# Patient Record
Sex: Female | Born: 1950
Health system: Southern US, Community
[De-identification: ages and names within clinical notes are randomized; demographics above are authoritative.]

## PROBLEM LIST (undated history)

## (undated) DIAGNOSIS — F32A Depression, unspecified: Secondary | ICD-10-CM

## (undated) DIAGNOSIS — C50919 Malignant neoplasm of unspecified site of unspecified female breast: Secondary | ICD-10-CM

## (undated) DIAGNOSIS — M1990 Unspecified osteoarthritis, unspecified site: Secondary | ICD-10-CM

## (undated) DIAGNOSIS — Z923 Personal history of irradiation: Secondary | ICD-10-CM

## (undated) DIAGNOSIS — K6389 Other specified diseases of intestine: Secondary | ICD-10-CM

## (undated) DIAGNOSIS — Z8719 Personal history of other diseases of the digestive system: Secondary | ICD-10-CM

## (undated) DIAGNOSIS — K5900 Constipation, unspecified: Secondary | ICD-10-CM

## (undated) DIAGNOSIS — Z973 Presence of spectacles and contact lenses: Secondary | ICD-10-CM

## (undated) DIAGNOSIS — Z860100 Personal history of colon polyps, unspecified: Secondary | ICD-10-CM

## (undated) DIAGNOSIS — R519 Headache, unspecified: Secondary | ICD-10-CM

## (undated) DIAGNOSIS — Z87440 Personal history of urinary (tract) infections: Secondary | ICD-10-CM

## (undated) DIAGNOSIS — R0981 Nasal congestion: Secondary | ICD-10-CM

## (undated) DIAGNOSIS — Z9221 Personal history of antineoplastic chemotherapy: Secondary | ICD-10-CM

## (undated) DIAGNOSIS — Z8709 Personal history of other diseases of the respiratory system: Secondary | ICD-10-CM

## (undated) DIAGNOSIS — I82890 Acute embolism and thrombosis of other specified veins: Secondary | ICD-10-CM

## (undated) DIAGNOSIS — E039 Hypothyroidism, unspecified: Secondary | ICD-10-CM

## (undated) DIAGNOSIS — I8290 Acute embolism and thrombosis of unspecified vein: Secondary | ICD-10-CM

## (undated) DIAGNOSIS — R42 Dizziness and giddiness: Secondary | ICD-10-CM

## (undated) DIAGNOSIS — I10 Essential (primary) hypertension: Secondary | ICD-10-CM

## (undated) DIAGNOSIS — R112 Nausea with vomiting, unspecified: Secondary | ICD-10-CM

## (undated) DIAGNOSIS — J189 Pneumonia, unspecified organism: Secondary | ICD-10-CM

## (undated) DIAGNOSIS — E079 Disorder of thyroid, unspecified: Secondary | ICD-10-CM

## (undated) DIAGNOSIS — E785 Hyperlipidemia, unspecified: Secondary | ICD-10-CM

## (undated) DIAGNOSIS — M2550 Pain in unspecified joint: Secondary | ICD-10-CM

## (undated) DIAGNOSIS — M7989 Other specified soft tissue disorders: Secondary | ICD-10-CM

## (undated) DIAGNOSIS — K649 Unspecified hemorrhoids: Secondary | ICD-10-CM

## (undated) DIAGNOSIS — D649 Anemia, unspecified: Secondary | ICD-10-CM

## (undated) DIAGNOSIS — R110 Nausea: Secondary | ICD-10-CM

## (undated) DIAGNOSIS — Z8669 Personal history of other diseases of the nervous system and sense organs: Secondary | ICD-10-CM

## (undated) DIAGNOSIS — R51 Headache: Secondary | ICD-10-CM

## (undated) DIAGNOSIS — K219 Gastro-esophageal reflux disease without esophagitis: Secondary | ICD-10-CM

## (undated) DIAGNOSIS — A159 Respiratory tuberculosis unspecified: Secondary | ICD-10-CM

## (undated) DIAGNOSIS — Z889 Allergy status to unspecified drugs, medicaments and biological substances status: Secondary | ICD-10-CM

## (undated) DIAGNOSIS — F329 Major depressive disorder, single episode, unspecified: Secondary | ICD-10-CM

## (undated) DIAGNOSIS — Z9889 Other specified postprocedural states: Secondary | ICD-10-CM

## (undated) DIAGNOSIS — C801 Malignant (primary) neoplasm, unspecified: Secondary | ICD-10-CM

## (undated) DIAGNOSIS — M549 Dorsalgia, unspecified: Secondary | ICD-10-CM

## (undated) DIAGNOSIS — Z8489 Family history of other specified conditions: Secondary | ICD-10-CM

## (undated) HISTORY — DX: Nasal congestion: R09.81

## (undated) HISTORY — DX: Nausea: R11.0

## (undated) HISTORY — DX: Other specified soft tissue disorders: M79.89

## (undated) HISTORY — PX: CARPAL TUNNEL RELEASE: SHX101

## (undated) HISTORY — PX: PORTACATH PLACEMENT: SHX2246

## (undated) HISTORY — DX: Headache: R51

## (undated) HISTORY — DX: Headache, unspecified: R51.9

## (undated) HISTORY — PX: ESOPHAGOGASTRODUODENOSCOPY: SHX1529

## (undated) HISTORY — DX: Hyperlipidemia, unspecified: E78.5

## (undated) HISTORY — DX: Constipation, unspecified: K59.00

## (undated) HISTORY — DX: Essential (primary) hypertension: I10

## (undated) HISTORY — DX: Disorder of thyroid, unspecified: E07.9

## (undated) HISTORY — PX: EXPLORATORY LAPAROTOMY: SUR591

## (undated) HISTORY — PX: ABDOMINAL HYSTERECTOMY: SHX81

## (undated) HISTORY — PX: APPENDECTOMY: SHX54

## (undated) HISTORY — PX: COLONOSCOPY: SHX174

---

## 1996-08-16 HISTORY — PX: KNEE SURGERY: SHX244

## 1997-11-20 ENCOUNTER — Encounter: Admission: RE | Admit: 1997-11-20 | Discharge: 1997-11-20 | Payer: Self-pay | Admitting: Sports Medicine

## 1997-12-30 ENCOUNTER — Encounter: Admission: RE | Admit: 1997-12-30 | Discharge: 1997-12-30 | Payer: Self-pay | Admitting: Family Medicine

## 1998-02-12 ENCOUNTER — Encounter: Admission: RE | Admit: 1998-02-12 | Discharge: 1998-02-12 | Payer: Self-pay | Admitting: Family Medicine

## 1998-02-27 ENCOUNTER — Other Ambulatory Visit: Admission: RE | Admit: 1998-02-27 | Discharge: 1998-02-27 | Payer: Self-pay | Admitting: Family Medicine

## 1998-02-27 ENCOUNTER — Encounter: Admission: RE | Admit: 1998-02-27 | Discharge: 1998-02-27 | Payer: Self-pay | Admitting: Family Medicine

## 1998-03-18 ENCOUNTER — Encounter: Admission: RE | Admit: 1998-03-18 | Discharge: 1998-03-18 | Payer: Self-pay | Admitting: Family Medicine

## 1998-11-12 ENCOUNTER — Encounter: Admission: RE | Admit: 1998-11-12 | Discharge: 1998-11-12 | Payer: Self-pay | Admitting: Family Medicine

## 1998-11-26 ENCOUNTER — Encounter: Admission: RE | Admit: 1998-11-26 | Discharge: 1998-11-26 | Payer: Self-pay | Admitting: Family Medicine

## 1999-03-06 ENCOUNTER — Encounter: Admission: RE | Admit: 1999-03-06 | Discharge: 1999-03-06 | Payer: Self-pay | Admitting: Family Medicine

## 1999-03-17 ENCOUNTER — Encounter: Admission: RE | Admit: 1999-03-17 | Discharge: 1999-03-17 | Payer: Self-pay | Admitting: Family Medicine

## 1999-03-26 ENCOUNTER — Encounter: Admission: RE | Admit: 1999-03-26 | Discharge: 1999-03-26 | Payer: Self-pay | Admitting: Family Medicine

## 1999-06-24 ENCOUNTER — Encounter: Admission: RE | Admit: 1999-06-24 | Discharge: 1999-06-24 | Payer: Self-pay | Admitting: Family Medicine

## 1999-07-30 ENCOUNTER — Encounter: Admission: RE | Admit: 1999-07-30 | Discharge: 1999-07-30 | Payer: Self-pay | Admitting: Family Medicine

## 1999-12-15 ENCOUNTER — Encounter: Admission: RE | Admit: 1999-12-15 | Discharge: 1999-12-15 | Payer: Self-pay | Admitting: Family Medicine

## 1999-12-29 ENCOUNTER — Encounter: Admission: RE | Admit: 1999-12-29 | Discharge: 1999-12-29 | Payer: Self-pay | Admitting: Family Medicine

## 2000-03-14 ENCOUNTER — Encounter: Admission: RE | Admit: 2000-03-14 | Discharge: 2000-03-14 | Payer: Self-pay | Admitting: Family Medicine

## 2000-04-01 ENCOUNTER — Encounter: Admission: RE | Admit: 2000-04-01 | Discharge: 2000-04-01 | Payer: Self-pay | Admitting: Family Medicine

## 2000-04-21 ENCOUNTER — Ambulatory Visit (HOSPITAL_COMMUNITY): Admission: RE | Admit: 2000-04-21 | Discharge: 2000-04-21 | Payer: Self-pay | Admitting: Legal Medicine

## 2000-04-29 ENCOUNTER — Encounter: Admission: RE | Admit: 2000-04-29 | Discharge: 2000-04-29 | Payer: Self-pay | Admitting: Family Medicine

## 2000-05-02 ENCOUNTER — Encounter: Admission: RE | Admit: 2000-05-02 | Discharge: 2000-05-02 | Payer: Self-pay | Admitting: Family Medicine

## 2000-10-05 ENCOUNTER — Encounter: Admission: RE | Admit: 2000-10-05 | Discharge: 2000-10-05 | Payer: Self-pay | Admitting: Family Medicine

## 2001-12-06 ENCOUNTER — Encounter: Admission: RE | Admit: 2001-12-06 | Discharge: 2001-12-06 | Payer: Self-pay | Admitting: Family Medicine

## 2002-06-19 ENCOUNTER — Encounter: Admission: RE | Admit: 2002-06-19 | Discharge: 2002-06-19 | Payer: Self-pay | Admitting: Family Medicine

## 2002-07-11 ENCOUNTER — Encounter: Admission: RE | Admit: 2002-07-11 | Discharge: 2002-07-11 | Payer: Self-pay | Admitting: Family Medicine

## 2002-07-25 ENCOUNTER — Encounter: Payer: Self-pay | Admitting: Sports Medicine

## 2002-07-25 ENCOUNTER — Ambulatory Visit (HOSPITAL_COMMUNITY): Admission: RE | Admit: 2002-07-25 | Discharge: 2002-07-25 | Payer: Self-pay | Admitting: Sports Medicine

## 2002-08-01 ENCOUNTER — Encounter: Admission: RE | Admit: 2002-08-01 | Discharge: 2002-08-01 | Payer: Self-pay | Admitting: Family Medicine

## 2002-08-07 ENCOUNTER — Encounter: Payer: Self-pay | Admitting: Sports Medicine

## 2002-08-07 ENCOUNTER — Encounter: Admission: RE | Admit: 2002-08-07 | Discharge: 2002-08-07 | Payer: Self-pay | Admitting: Sports Medicine

## 2002-08-24 ENCOUNTER — Ambulatory Visit (HOSPITAL_COMMUNITY): Admission: RE | Admit: 2002-08-24 | Discharge: 2002-08-24 | Payer: Self-pay | Admitting: Sports Medicine

## 2002-08-24 ENCOUNTER — Encounter: Payer: Self-pay | Admitting: Sports Medicine

## 2002-08-28 ENCOUNTER — Encounter: Admission: RE | Admit: 2002-08-28 | Discharge: 2002-08-28 | Payer: Self-pay | Admitting: Sports Medicine

## 2002-09-05 ENCOUNTER — Encounter: Admission: RE | Admit: 2002-09-05 | Discharge: 2002-09-05 | Payer: Self-pay | Admitting: Family Medicine

## 2002-09-17 ENCOUNTER — Encounter: Admission: RE | Admit: 2002-09-17 | Discharge: 2002-09-17 | Payer: Self-pay | Admitting: Family Medicine

## 2002-10-08 ENCOUNTER — Encounter: Admission: RE | Admit: 2002-10-08 | Discharge: 2002-10-08 | Payer: Self-pay | Admitting: Family Medicine

## 2002-10-11 ENCOUNTER — Encounter: Admission: RE | Admit: 2002-10-11 | Discharge: 2002-10-11 | Payer: Self-pay | Admitting: Family Medicine

## 2005-09-07 ENCOUNTER — Inpatient Hospital Stay (HOSPITAL_COMMUNITY): Admission: EM | Admit: 2005-09-07 | Discharge: 2005-09-08 | Payer: Self-pay | Admitting: Emergency Medicine

## 2006-02-22 ENCOUNTER — Ambulatory Visit (HOSPITAL_COMMUNITY): Admission: RE | Admit: 2006-02-22 | Discharge: 2006-02-22 | Payer: Self-pay | Admitting: Emergency Medicine

## 2007-05-10 ENCOUNTER — Ambulatory Visit (HOSPITAL_COMMUNITY): Admission: RE | Admit: 2007-05-10 | Discharge: 2007-05-10 | Payer: Self-pay | Admitting: Family Medicine

## 2010-09-06 ENCOUNTER — Encounter: Payer: Self-pay | Admitting: Emergency Medicine

## 2011-01-01 NOTE — Cardiovascular Report (Signed)
NAMECHALON, ZOBRIST               ACCOUNT NO.:  [id number]   MEDICAL RECORD NO.:  [id number]          PATIENT TYPE:  INP   LOCATION:  1830                         FACILITY:  [facility]   PHYSICIAN:  Nanetta Batty, M.D.   DATE OF BIRTH:  Aug 20, 1950   DATE OF PROCEDURE:  09/07/2005  DATE OF DISCHARGE:                              CARDIAC CATHETERIZATION   CLINICAL HISTORY:  [name] is a 60 year old mildly overweight African-  American female patient of Dr. Verl Dicker with a history of hypertension and  dyspnea on exertion.  She had a normal echocardiogram and carotid stress  test.  She developed chest pain at 11:00 this morning which was sharp and  substernal.  EMS arrived and she was treated with aspirin and sublingual  nitroglycerin which resulted in improvement in her chest pain symptoms.  She  was brought to [facility] ER where she had waxing and waning chest pain.  Her examination was benign.  EKG showed no acute changes.  Her enzymes were  negative.  She was heparinized and was brought to the catheterization  laboratory for diagnostic coronary arteriography to rule out an ischemic  etiology.   PROCEDURE DESCRIPTION:  The patient was brought to the second floor [facility] in the postabsorptive state.  She was  premedicated with p.o. Valium and IV Versed.  Her right groin was prepped  and shaved in the usual sterile fashion.  1% Xylocaine was used for local  anesthesia.  A 6-French sheath was inserted into the right femoral artery  using standard Seldinger technique.  6-French right and left Judkins  diagnostic catheters along with 6-French pigtail catheter were used for  selective coronary angiography, left ventriculography, supravalvular  aortography, respectively.  Visipaque dye was used for the entirety of the  case.  Retrograde aortic, right and left ventricular, and pullback pressures  were recorded.   HEMODYNAMICS:  1.  Aortic systolic  pressure 114, diastolic pressure 82.  2.  Left ventricular systolic pressure 117, end-diastolic pressure 10.   SELECTIVE CORONARY ANGIOGRAPHY:  Left main normal.   LAD normal.   Left circumflex normal.   Ramus intermedius branch normal.   Right coronary artery was codominant and normal.   LEFT VENTRICULOGRAM:  RAO left ventriculogram was performed using 25 mL of  Visipaque dye at 12 mL/second.  The overall LV EF was estimated at greater  than 60% without focal wall motion abnormalities.   SUPRAVALVULAR AORTOGRAPHY:  Performed in the LAO view using 20 mL of  Visipaque dye at 20 mL/second.  The aortic root was normal in caliber.  There was no AI.  There was no dissection.  Aortic vessels were intact.   IMPRESSION:  [name] has essentially normal catheterization, normal  coronary arteries and left ventricular function without evidence of  dissection.  Believe her chest pain is non-cardiac.  She will be treated  with PPI b.i.d.  and we will obtain a chest CT in the morning to rule out pulmonary embolus  which I think is low likelihood.  If this is negative she will be discharged  home  with follow-up with Dr. Yates Decamp.  Patient left the laboratory in  stable condition.      Nanetta Batty, M.D.  Electronically Signed     JB/MEDQ  D:  09/07/2005  T:  09/08/2005  Job:  [id number]   cc:   Cath Lab   [facility]   Lovenia Kim, D.O.  Fax: [phone]   Cristy Hilts. Jacinto Halim, MD  Fax: [phone]

## 2011-01-01 NOTE — Discharge Summary (Signed)
[name], [name]               ACCOUNT NO.:  [id number]   MEDICAL RECORD NO.:  [id number]          PATIENT TYPE:  INP   LOCATION:  4707                         FACILITY:  [facility]   PHYSICIAN:  Nanetta Batty, M.D.   DATE OF BIRTH:  1951-08-12   DATE OF ADMISSION:  09/07/2005  DATE OF DISCHARGE:  09/08/2005                                 DISCHARGE SUMMARY   DISCHARGE DIAGNOSES:  1.  Chest pain, negative for myocardial infarction, patent coronary arteries      with normal left ventricular function, negative computerized tomography      of the chest for pulmonary embolus.  2.  Family history of coronary disease.  3.  Hypertension.  4.  Hypothyroidism.  5.  Right lung nodules,   DISCHARGE CONDITION:  The discharge condition is improved.   OPERATION/PROCEDURE:  September 07, 2005 __________  left heart  catheterization by Dr. Nanetta Batty.   DISCHARGE MEDICATIONS:  1.  Protonix 40 mg 1 twice a day.  2.  Maxzide 37.5/25 mg daily.  3.  Synthroid 100 mcg daily.  4.  Toprol XL 25 mg daily.   DISCHARGE INSTRUCTIONS:   DIET:  Low-fat, low-salt diet.   WOUND CARE:  Wash the right groin site with soap and water.  Call us for any  bleeding, swelling or drainage.   FOLLOW UP:  The patient is follow up with Dr. Allyson Sabal; the office will call  the patient with the appointment date and time.  The patient will need a  repeat CT of the chest in three months for follow up secondary to lung  nodules with the largest measuring 3 mm.   HISTORY OF THE PRESENT ILLNESS:  This is a 60 year old African-American  female without prior history of coronary disease presents to the ER with  complaints of chest pain, weakness and nausea, which started the morning of  admission September 07, 2005, and she was unable to finish cooking her  breakfast.  She felt severe chest pressure, pain in the middle of the chest  and she felt nauseated.  She came to the emergency room and is admitted to  [facility].   PAST MEDICAL HISTORY:  1.  Graves' disease.  2.  Hypertension,  3.  Mild depression.  4.  Interstitial cystitis.  5.  History of hysterectomy.   Last Cardiolite was in 2006.  EF was 70%, though she did have breast  attenuation artifact.  Two-dimensional echo in September 2006 revealed  impaired LV relaxation, normal EF and no significant valvular abnormality.   FAMILY HISTORY:  The family history is strongly positive for coronary  disease.   SOCIAL HISTORY:  Please see the history and physical.   REVIEW OF SYSTEMS:  Please see the history and physical.   PHYSICAL EXAMINATION:  The physical exam at discharged shows the following:  VITAL SIGNS:  Blood pressure 104/46, pulse 64, respirations 18, temperature  98.2, and oxygen saturation on 2 liters is 100%.  HEART:  The heart has a regular rate and rhythm.  S1 and S2.  ABDOMEN:  The abdomen has positive bowel  sounds.  EXTREMITIES:  The right groin is stable.  One plus pedal __________ .   LABORATORY DATA:  Hemoglobin 15.3, hematocrit 45, WBC 8.5 and platelets  308,000.  D-dimer was less than 0.22.  Chemistries:  Sodium 138, potassium  3.7, chloride 109, CO2 26, glucose 94-127, BUN 8, creatinine 0.8, and  calcium 85.  Total protein 6.3, albumin 3.2.  Sodium remained stable.  LFTs;  AST 23, ALT 18, alkaline phos 52, and total bili 0.7.  Cardiac enzymes; CK  range 106, 98 and 94, MB 0.6, troponin I 0.01.  Cholesterol 231,  triglycerides 130, HDL 59 and LDL 146.  TSH 1.284.   CT was as stated.  EKG shows sinus rhythm with an inferior infarct, age  undetermined, cannot not rule out anterior infarct, age undetermined; no old  EKGs for comparison.  Cardiac cath with patent _________ and normal LV.   HOSPITAL COURSE:  [name] was admitted September 07, 2005 after presenting  with chest pain.  She underwent cardiac cath nonemergently, but electively,  which shown her to have patent __________ , normal EF.  She was kept   overnight due to the time of the cath.  On September 08, 2005 she was stable  and ready for discharge.  We did get a CT of her chest to rule out PE, which  was negative.   The patient will follow up with Dr. Allyson Sabal as an outpatient and will call if  she has further problems.      Darcella Gasman. Annie Paras, N.P.      Nanetta Batty, M.D.  Electronically Signed    LRI/MEDQ  D:  10/26/2005  T:  10/28/2005  Job:  [id number]   cc:   Urgent Care

## 2011-03-08 ENCOUNTER — Emergency Department (HOSPITAL_COMMUNITY): Payer: BC Managed Care – PPO

## 2011-03-08 ENCOUNTER — Inpatient Hospital Stay (HOSPITAL_COMMUNITY)
Admission: EM | Admit: 2011-03-08 | Discharge: 2011-03-12 | DRG: 143 | Disposition: A | Discharge status: Home / Self Care | Payer: BC Managed Care – PPO | Attending: Cardiovascular Disease | Admitting: Cardiovascular Disease

## 2011-03-08 DIAGNOSIS — E039 Hypothyroidism, unspecified: Secondary | ICD-10-CM | POA: Diagnosis present

## 2011-03-08 DIAGNOSIS — K2970 Gastritis, unspecified, without bleeding: Secondary | ICD-10-CM | POA: Diagnosis present

## 2011-03-08 DIAGNOSIS — R079 Chest pain, unspecified: Principal | ICD-10-CM | POA: Diagnosis present

## 2011-03-08 DIAGNOSIS — F329 Major depressive disorder, single episode, unspecified: Secondary | ICD-10-CM | POA: Diagnosis present

## 2011-03-08 DIAGNOSIS — F3289 Other specified depressive episodes: Secondary | ICD-10-CM | POA: Diagnosis present

## 2011-03-08 DIAGNOSIS — K2990 Gastroduodenitis, unspecified, without bleeding: Secondary | ICD-10-CM | POA: Diagnosis present

## 2011-03-08 DIAGNOSIS — E785 Hyperlipidemia, unspecified: Secondary | ICD-10-CM | POA: Diagnosis present

## 2011-03-08 DIAGNOSIS — E559 Vitamin D deficiency, unspecified: Secondary | ICD-10-CM | POA: Diagnosis present

## 2011-03-08 DIAGNOSIS — I10 Essential (primary) hypertension: Secondary | ICD-10-CM | POA: Diagnosis present

## 2011-03-08 DIAGNOSIS — D649 Anemia, unspecified: Secondary | ICD-10-CM | POA: Diagnosis present

## 2011-03-08 DIAGNOSIS — R911 Solitary pulmonary nodule: Secondary | ICD-10-CM | POA: Diagnosis present

## 2011-03-08 DIAGNOSIS — I519 Heart disease, unspecified: Secondary | ICD-10-CM | POA: Diagnosis present

## 2011-03-09 ENCOUNTER — Inpatient Hospital Stay (HOSPITAL_COMMUNITY): Payer: BC Managed Care – PPO

## 2011-03-09 LAB — CBC
HCT: 39.5 % (ref 36.0–46.0)
HCT: 36.6 % (ref 36.0–46.0)
Hemoglobin: 12.8 g/dL (ref 12.0–15.0)
Hemoglobin: 11.8 g/dL — ABNORMAL LOW (ref 12.0–15.0)
RBC: 4.94 MIL/uL (ref 3.87–5.11)
RBC: 4.57 MIL/uL (ref 3.87–5.11)
WBC: 10.8 10*3/uL — ABNORMAL HIGH (ref 4.0–10.5)

## 2011-03-09 LAB — MAGNESIUM: Magnesium: 1.9 mg/dL (ref 1.5–2.5)

## 2011-03-09 LAB — URINALYSIS, ROUTINE W REFLEX MICROSCOPIC
Glucose, UA: NEGATIVE mg/dL
Ketones, ur: NEGATIVE mg/dL
Protein, ur: NEGATIVE mg/dL
pH: 5.5 (ref 5.0–8.0)

## 2011-03-09 LAB — URINE MICROSCOPIC-ADD ON

## 2011-03-09 LAB — CK TOTAL AND CKMB (NOT AT ARMC)
CK, MB: 1.3 ng/mL (ref 0.3–4.0)
CK, MB: 1.3 ng/mL (ref 0.3–4.0)
Relative Index: 0.8 (ref 0.0–2.5)
Relative Index: 0.8 (ref 0.0–2.5)
Total CK: 165 U/L (ref 7–177)

## 2011-03-09 LAB — CARDIAC PANEL(CRET KIN+CKTOT+MB+TROPI)
CK, MB: 1.4 ng/mL (ref 0.3–4.0)
CK, MB: 1.5 ng/mL (ref 0.3–4.0)
CK, MB: 1.4 ng/mL (ref 0.3–4.0)
Relative Index: 1 (ref 0.0–2.5)
Total CK: 140 U/L (ref 7–177)
Total CK: 143 U/L (ref 7–177)
Troponin I: <0.3 ng/mL (ref <0.30)
Troponin I: <0.3 ng/mL (ref <0.30)

## 2011-03-09 LAB — BASIC METABOLIC PANEL
Calcium: 9 mg/dL (ref 8.4–10.5)
GFR calc Af Amer: >60 mL/min (ref >60)
GFR calc non Af Amer: >60 mL/min (ref >60)
Glucose, Bld: 93 mg/dL (ref 70–99)
Potassium: 3.8 mEq/L (ref 3.5–5.1)
Sodium: 141 mEq/L (ref 135–145)

## 2011-03-09 LAB — COMPREHENSIVE METABOLIC PANEL
Albumin: 2.9 g/dL — ABNORMAL LOW (ref 3.5–5.2)
Alkaline Phosphatase: 50 U/L (ref 39–117)
BUN: 11 mg/dL (ref 6–23)
CO2: 27 mEq/L (ref 19–32)
Chloride: 106 mEq/L (ref 96–112)
GFR calc Af Amer: >60 mL/min (ref >60)
GFR calc non Af Amer: >60 mL/min (ref >60)
Glucose, Bld: 96 mg/dL (ref 70–99)
Potassium: 3.6 mEq/L (ref 3.5–5.1)
Total Bilirubin: 0.6 mg/dL (ref 0.3–1.2)

## 2011-03-09 LAB — LIPID PANEL
Cholesterol: 178 mg/dL (ref 0–200)
Triglycerides: 107 mg/dL (ref <150)

## 2011-03-09 LAB — DIFFERENTIAL
Basophils Absolute: 0.1 10*3/uL (ref 0.0–0.1)
Basophils Relative: 1 % (ref 0–1)
Lymphocytes Relative: 32 % (ref 12–46)
Monocytes Absolute: 0.8 10*3/uL (ref 0.1–1.0)
Neutro Abs: 6.2 10*3/uL (ref 1.7–7.7)
Neutrophils Relative %: 58 % (ref 43–77)

## 2011-03-09 LAB — PROTIME-INR
INR: 1.14 (ref 0.00–1.49)
Prothrombin Time: 14.8 seconds (ref 11.6–15.2)

## 2011-03-09 LAB — HEMOGLOBIN A1C: Hgb A1c MFr Bld: 6.6 % — ABNORMAL HIGH (ref <5.7)

## 2011-03-09 LAB — TROPONIN I: Troponin I: <0.3 ng/mL (ref <0.30)

## 2011-03-10 ENCOUNTER — Inpatient Hospital Stay (HOSPITAL_COMMUNITY): Payer: BC Managed Care – PPO

## 2011-03-10 ENCOUNTER — Encounter (HOSPITAL_COMMUNITY): Payer: BC Managed Care – PPO | Attending: Internal Medicine

## 2011-03-10 DIAGNOSIS — K219 Gastro-esophageal reflux disease without esophagitis: Secondary | ICD-10-CM

## 2011-03-10 DIAGNOSIS — R079 Chest pain, unspecified: Secondary | ICD-10-CM

## 2011-03-10 DIAGNOSIS — K7689 Other specified diseases of liver: Secondary | ICD-10-CM

## 2011-03-10 LAB — URINE CULTURE: Colony Count: 50000

## 2011-03-10 LAB — CARDIAC PANEL(CRET KIN+CKTOT+MB+TROPI)
CK, MB: 1.5 ng/mL (ref 0.3–4.0)
Relative Index: 1 (ref 0.0–2.5)
Total CK: 138 U/L (ref 7–177)
Troponin I: <0.3 ng/mL (ref <0.30)
Troponin I: <0.3 ng/mL (ref <0.30)

## 2011-03-10 MED ORDER — TECHNETIUM TC 99M TETROFOSMIN IV KIT
10.0000 | PACK | Freq: Once | INTRAVENOUS | Status: AC | PRN
  Administered 2011-03-10: 10 via INTRAVENOUS

## 2011-03-10 MED ORDER — TECHNETIUM TC 99M TETROFOSMIN IV KIT
30.0000 | PACK | Freq: Once | INTRAVENOUS | Status: AC | PRN
  Administered 2011-03-10: 30 via INTRAVENOUS

## 2011-03-10 MED ORDER — IOHEXOL 300 MG/ML  SOLN
80.0000 mL | Freq: Once | INTRAMUSCULAR | Status: AC | PRN
  Administered 2011-03-10: 80 mL via INTRAVENOUS

## 2011-03-11 ENCOUNTER — Other Ambulatory Visit: Payer: Self-pay | Admitting: Gastroenterology

## 2011-03-11 DIAGNOSIS — R079 Chest pain, unspecified: Secondary | ICD-10-CM

## 2011-03-11 DIAGNOSIS — K219 Gastro-esophageal reflux disease without esophagitis: Secondary | ICD-10-CM

## 2011-03-11 LAB — GLUCOSE, CAPILLARY: Glucose-Capillary: 128 mg/dL — ABNORMAL HIGH (ref 70–99)

## 2011-03-12 ENCOUNTER — Encounter: Payer: Self-pay | Admitting: Gastroenterology

## 2011-03-12 LAB — CBC
MCH: 25.8 pg — ABNORMAL LOW (ref 26.0–34.0)
Platelets: 284 10*3/uL (ref 150–400)
RBC: 4.66 MIL/uL (ref 3.87–5.11)
RDW: 15.5 % (ref 11.5–15.5)
WBC: 7 10*3/uL (ref 4.0–10.5)

## 2011-03-12 LAB — GLUCOSE, CAPILLARY: Glucose-Capillary: 88 mg/dL (ref 70–99)

## 2011-03-22 NOTE — Discharge Summary (Signed)
  [name], [name]              ACCOUNT NO.:  [id number]  MEDICAL RECORD NO.:  [id number]  LOCATION:  3737                         FACILITY:  [facility]  PHYSICIAN:  [place] Hilty, MD         DATE OF BIRTH:  [birth year]  DATE OF ADMISSION:  03/08/2011 DATE OF DISCHARGE:  03/12/2011                              DISCHARGE SUMMARY   DISCHARGE DIAGNOSES: 1. Chest pain, worrisome for unstable angina this admission,     myocardial infarction ruled out. 2. Low-risk Lexiscan Myoview this admission. 3. History of normal coronaries at catheterization in 2007. 4. Mild gastritis by endoscopy this admission. 5. Good left ventricular function with grade 1 diastolic dysfunction. 6. Treated hypertension. 7. Treated dyslipidemia. 8. Treated hypothyroidism.  HOSPITAL COURSE:  The patient is a 60 year old female who has been seen by [place] in the past.  She presented as a referral from Urgent Care.  She has had a previous catheterization in 2007 that showed normal coronaries.  She does have risk factors for coronary artery disease including dyslipidemia and hypertension.  She was admitted through the emergency room for unstable angina.  Enzymes were negative.  Myoview was done on March 10, 2011, which was low risk.  She continued to have discomfort and we did a CT scan of her chest which was negative for pulmonary embolism.  She did have a slightly positive D-dimer of 0.35. There were small left lung nodules which were unchanged from 2008 and considered benign.  Echocardiogram revealed an EF of 60-65% with grade 1 diastolic dysfunction.  We asked the patient to be seen in consult by the GI Service and she was seen by Dr. Russella Dar.  Endoscopy was done on March 11, 2011, which showed no obvious cause for chest pain.  She did have mild gastritis and PPI continuation was recommended as well as antireflux regime.  Dr. Rennis Golden feels the patient's symptoms are probably musculoskeletal.  We feel she can be discharged on  March 12, 2011, and we will see her in follow up.  We suggested a nonsteroidal anti- inflammatory p.r.n.  She is discharged on Protonix and will continue this.  LABORATORY DATA:  White count 7.0, hemoglobin 12, hematocrit 37.1, platelets 204.  CK-MB and troponin were negative.  TSH 1.12.  Urinalysis is unremarkable.  Urine culture was obtained which showed multiple bacteria, clinically we did not feel she had an UTI.  Cholesterol is 178, HDL 67, LDL 90.  Sodium 142, potassium 3.6, BUN 11, creatinine 0.9. Liver functions were normal.  EKG shows sinus rhythm without acute changes.  DISPOSITION:  The patient is discharged in stable condition and will follow up with Dr. Rennis Golden in a couple of weeks in the office.  Please see med rec for complete discharge medications.     Abelino Derrick, P.A.   ______________________________ [place] Hilty, MD    LKK/MEDQ  D:  03/12/2011  T:  03/12/2011  Job:  [id number]  cc:   Venita Lick. Russella Dar, MD, [facility]  Electronically Signed by Corine Shelter P.A. on 03/18/2011 09:56:27 AM Electronically Signed by Kirtland Bouchard. HILTY M.D. on 03/22/2011 01:23:08 PM

## 2011-04-28 NOTE — H&P (Signed)
Jordan [name], Jordan [name] NO.:  [id number]  MEDICAL RECORD NO.:  [id number]  LOCATION:  3737                         FACILITY:  [facility]  PHYSICIAN:  Nicki Guadalajara, M.D.     DATE OF BIRTH:  05/17/1951  DATE OF ADMISSION:  03/08/2011 DATE OF DISCHARGE:                             HISTORY & PHYSICAL   PRIMARY CARE PHYSICIAN:  [facility].  CHIEF COMPLAINT:  Chest pain.  HISTORY OF PRESENT ILLNESS:  Jordan [name] is a very pleasant 60 year old African American female who presents to the emergency department this evening with complaints of 3-day history of chest discomfort.  States that, beginning on Friday, she noticed some mild substernal chest discomfort which radiated to her left chest, which occurred at rest. Throughout the weekend, she became more aware of this chest discomfort that became more severe.  She also experienced some associated shortness of breath and became very short of breath with minimal exertion.  She has had marked fatigue and tiredness as well which is unusual for her. She denies any orthopnea, no PND or lower extremity edema.  She does not experience any tachycardia or palpitations.  No syncope or presyncope. On arrival to the emergency department, her EKG revealed normal sinus rhythm without ischemic changes.  Her cardiac enzymes have been negative x2.  She was treated with sublingual nitroglycerin which decreased her pain.  When it was not completely relieved, she received 4 mg of IV morphine as well as a GI cocktail.  She states that her pain was relieved after the IV morphine.  She has experienced some mild nausea with her chest discomfort but no vomiting.  No diaphoresis.  Her chest pain is not constant.  It is intermittent.  She has not been able to identify any aggravating or alleviating factors except for the morphine thus far.  Currently, she is pain-free.  PAST MEDICAL HISTORY: 1. Chest pain.  A cardiac  catheterization in January 2007 which     revealed normal coronary arteries. 2. Hypertension. 3. Hypothyroidism. 4. Interstitial cystitis. 5. Dyslipidemia. 6. Depression. 7. Vitamin D deficiency. 8. History of vertigo. 9. History of left lung nodule.  Family history is positive for coronary artery disease.  SOCIAL HISTORY:  She is married.  She has one child.  She is also raising her granddaughter.  She is a retired Runner, broadcasting/film/video.  She denies any tobacco or alcohol use.  ALLERGIES:  None known.  CURRENT MEDICATIONS: 1. Triamterene/hydrochlorothiazide 37.5/25 daily one-half tablets 2. Synthroid 125 mcg daily. 3. Prilosec OTC. 4. Wellbutrin XL 150 mg daily. 5. Metoprolol 25 mg one-half tablet daily. 6. Zetia 10 mg daily. 7. Aspirin 81 mg daily. 8. Simvastatin 40 mg daily.  REVIEW OF SYSTEMS:  As per HPI, otherwise negative.  PHYSICAL EXAMINATION:  VITAL SIGNS:  Blood pressure is 105/71, pulse is 72, respirations 16, pulse ox is 100%, and temperature is 98.4. GENERAL:  This is a very pleasant 60 year old African American female in no acute distress. HEENT:  Pupils are equal and reactive to light and accommodation. Extraocular movements intact. NECK:  Supple.  No JVD.  No thyromegaly or carotid bruits. CARDIOVASCULAR:  Regular rate and rhythm.  S1, S2.  No appreciable murmur, gallop, or rub. LUNGS:  Clear to auscultation bilaterally with normal respiratory effort. ABDOMEN:  Soft, nontender without hepatosplenomegaly or masses. EXTREMITIES:  Radial, femoral, dorsal pedal arteries are present without lower extremity edema.  No clubbing, cyanosis, or ulcers.  NEUROLOGIC: Alert and oriented to person, place, and time.  Norman mood and affect. SKIN:  Warm and dry.  LABORATORY DATA:  White blood cell count of 10.8, hemoglobin is 12.8, hematocrit of 39.5, platelets are 330.  Cardiac enzymes have been negative x2.  Sodium is 141, potassium 3.8, chloride is 106, carbon dioxide is 27,  glucose is 93, BUN is 12, creatinine 0.93.  IMPRESSION: 1. Chest pain/unstable angina. 2. Exertional dyspnea. 3. History of normal coronaries in 2007. 4. Hypertension. 5. Dyslipidemia. 6. Family history of coronary artery disease. 7. Hypothyroidism. 8. Depression.  PLAN:  We will admit her to telemetry and we will rule her out for myocardial infarction.  We will place topical nitrates and continue with beta-blocker therapy.  We will begin Lovenox b.i.d. as well as aspirin. We will follow her enzymes overnight and reassess her pain as well as repeat her EKG in the morning.  We will obtain a portable chest x-ray tonight and check a UA and the D-dimer.  We will keep her n.p.o. for possible cath versus Myoview in the morning.  We will reassess her labs and symptoms in the morning and determine which would be the best study to proceed with.    ______________________________ [employer], NP   ______________________________ Nicki Guadalajara, M.D.    LS/MEDQ  D:  03/08/2011  T:  03/09/2011  Job:  [id number]  cc:   [facility]  Electronically Signed by Charmian Muff NP on 04/27/2011 10:03:00 PM Electronically Signed by Nicki Guadalajara M.D. on 04/28/2011 12:10:41 PM

## 2011-10-21 ENCOUNTER — Ambulatory Visit (INDEPENDENT_AMBULATORY_CARE_PROVIDER_SITE_OTHER): Payer: BC Managed Care – PPO | Admitting: Physician Assistant

## 2011-10-21 ENCOUNTER — Encounter: Payer: Self-pay | Admitting: Physician Assistant

## 2011-10-21 VITALS — BP 101/71 | HR 74 | Temp 97.6°F | Resp 16 | Ht 61.0 in | Wt 231.6 lb

## 2011-10-21 DIAGNOSIS — I10 Essential (primary) hypertension: Secondary | ICD-10-CM

## 2011-10-21 DIAGNOSIS — F321 Major depressive disorder, single episode, moderate: Secondary | ICD-10-CM | POA: Insufficient documentation

## 2011-10-21 DIAGNOSIS — E039 Hypothyroidism, unspecified: Secondary | ICD-10-CM

## 2011-10-21 DIAGNOSIS — Z1211 Encounter for screening for malignant neoplasm of colon: Secondary | ICD-10-CM

## 2011-10-21 DIAGNOSIS — F32A Depression, unspecified: Secondary | ICD-10-CM

## 2011-10-21 DIAGNOSIS — R918 Other nonspecific abnormal finding of lung field: Secondary | ICD-10-CM | POA: Insufficient documentation

## 2011-10-21 DIAGNOSIS — F329 Major depressive disorder, single episode, unspecified: Secondary | ICD-10-CM

## 2011-10-21 DIAGNOSIS — E559 Vitamin D deficiency, unspecified: Secondary | ICD-10-CM | POA: Insufficient documentation

## 2011-10-21 DIAGNOSIS — R42 Dizziness and giddiness: Secondary | ICD-10-CM

## 2011-10-21 DIAGNOSIS — E785 Hyperlipidemia, unspecified: Secondary | ICD-10-CM | POA: Insufficient documentation

## 2011-10-21 LAB — LIPID PANEL
HDL: 61 mg/dL (ref >39)
LDL Cholesterol: 89 mg/dL (ref 0–99)
Triglycerides: 99 mg/dL (ref <150)
VLDL: 20 mg/dL (ref 0–40)

## 2011-10-21 LAB — COMPREHENSIVE METABOLIC PANEL
AST: 26 U/L (ref 0–37)
Alkaline Phosphatase: 54 U/L (ref 39–117)
BUN: 13 mg/dL (ref 6–23)
Creat: 1.03 mg/dL (ref 0.50–1.10)

## 2011-10-21 MED ORDER — LEVOTHYROXINE SODIUM 150 MCG PO TABS: 150.0000 ug | ORAL_TABLET | Freq: Every day | ORAL | Status: DC

## 2011-10-21 NOTE — Patient Instructions (Signed)
Increase the levothyroxine dose to 150 mcg.

## 2011-10-21 NOTE — Progress Notes (Signed)
  Subjective:    Patient ID: Jordan [name], female    DOB: 29-May-1951, 61 y.o.   MRN: [id number]  HPI  Patient presents for followup of hyperlipidemia and hypothyroidism, also hypertension. At her last visit she was found to have an elevated TSH and cholesterol was elevated. She was contacted and asked if she was taking her medications regularly. The plan was that if she in fact was taking her medications regularly we needed to make some adjustments but that if she wasn't she could start taking them regularly and we could recheck in 12 weeks. Unfortunately, she has been taking her medications daily as prescribed but did not understand that she needed to let us know so that we could make adjustments. As a result her levothyroxine dose may need to the same despite the elevated TSH.  She describes feeling tired, difficulty with light weight loss, feeling depressed. Thinks she may want to increase her Wellbutrin.  She continues to make plans to leave her husband. She has been raising her granddaughter who will graduate from high school this spring and hopes to go to college in [place], [place]. For several years, the patient has been planning to move with her granddaughter. She has been saving up money to be able to support herself, unbeknownst to her husband. They have effectively been roommates for years now. He is not supportive of her. Talks down to her. Yet, she continues to keep the house and cook for him. She is starting to ask her what she wants and needs and as he is not responding, she is beginning to take more action.  Review of Systems As above. No chest pain, SOB, HA, dizziness, vision change, N/V, diarrhea, dysuria, myalgias, arthralgias or rash.     Objective:   Physical Exam Vital signs noted. Well-developed, well nourished BF who is awake, alert and oriented, in NAD. HEENT: [place]/AT, PERRL, EOMI.  Sclera and conjunctiva are clear.  EAC are patent, TMs are normal in appearance. Nasal  mucosa is pink and moist. OP is clear. Neck: supple, non-tender, no lymphadenopathey, thyromegaly. Heart: RRR, no murmur Lungs: CTA Abdomen: normo-active bowel sounds, supple, non-tender, no mass or organomegaly. Extremities: no cyanosis, clubbing or edema. Skin: warm and dry without rash.  TSH, lipids, CMET pending.     Assessment & Plan:   1. Unspecified hypothyroidism  TSH  2. Other and unspecified hyperlipidemia  Comprehensive metabolic panel, Lipid panel  3. Screening for colon cancer  Ambulatory referral to Gastroenterology   Increase levothyroxine to 150 mcg qd.  Recheck in 6 weeks.  If TSH normalizes and fatigue, depression persist, will increase Wellbutrin XL to 300 mg qd.

## 2011-11-11 ENCOUNTER — Encounter: Payer: Self-pay | Admitting: Family Medicine

## 2011-12-21 ENCOUNTER — Encounter: Payer: Self-pay | Admitting: Physician Assistant

## 2011-12-21 ENCOUNTER — Ambulatory Visit (INDEPENDENT_AMBULATORY_CARE_PROVIDER_SITE_OTHER): Payer: BC Managed Care – PPO | Admitting: Physician Assistant

## 2011-12-21 VITALS — BP 104/72 | HR 78 | Temp 98.7°F | Resp 16 | Ht 62.0 in | Wt 231.2 lb

## 2011-12-21 DIAGNOSIS — F3289 Other specified depressive episodes: Secondary | ICD-10-CM

## 2011-12-21 DIAGNOSIS — R739 Hyperglycemia, unspecified: Secondary | ICD-10-CM

## 2011-12-21 DIAGNOSIS — E039 Hypothyroidism, unspecified: Secondary | ICD-10-CM

## 2011-12-21 DIAGNOSIS — F32A Depression, unspecified: Secondary | ICD-10-CM

## 2011-12-21 DIAGNOSIS — F329 Major depressive disorder, single episode, unspecified: Secondary | ICD-10-CM

## 2011-12-21 DIAGNOSIS — E785 Hyperlipidemia, unspecified: Secondary | ICD-10-CM

## 2011-12-21 DIAGNOSIS — I10 Essential (primary) hypertension: Secondary | ICD-10-CM

## 2011-12-21 LAB — COMPREHENSIVE METABOLIC PANEL
ALT: 21 U/L (ref 0–35)
AST: 24 U/L (ref 0–37)
Creat: 1.04 mg/dL (ref 0.50–1.10)
Total Bilirubin: 0.6 mg/dL (ref 0.3–1.2)

## 2011-12-21 LAB — LIPID PANEL
Cholesterol: 186 mg/dL (ref 0–200)
LDL Cholesterol: 100 mg/dL — ABNORMAL HIGH (ref 0–99)
Total CHOL/HDL Ratio: 2.9 Ratio
VLDL: 21 mg/dL (ref 0–40)

## 2011-12-21 LAB — POCT GLYCOSYLATED HEMOGLOBIN (HGB A1C): Hemoglobin A1C: 5.9

## 2011-12-21 NOTE — Patient Instructions (Signed)
Healthy eating, exercise, enjoy the summer travel!
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
## 2011-12-23 ENCOUNTER — Encounter: Payer: Self-pay | Admitting: Physician Assistant

## 2012-01-18 ENCOUNTER — Other Ambulatory Visit: Payer: Self-pay | Admitting: Physician Assistant

## 2012-01-25 ENCOUNTER — Other Ambulatory Visit: Payer: Self-pay | Admitting: Physician Assistant

## 2012-01-28 ENCOUNTER — Other Ambulatory Visit: Payer: Self-pay | Admitting: Gastroenterology

## 2012-01-28 ENCOUNTER — Ambulatory Visit
Admission: RE | Admit: 2012-01-28 | Discharge: 2012-01-28 | Disposition: A | Discharge status: Home / Self Care | Payer: BC Managed Care – PPO | Source: Ambulatory Visit | Attending: Gastroenterology | Admitting: Gastroenterology

## 2012-01-28 DIAGNOSIS — K6389 Other specified diseases of intestine: Secondary | ICD-10-CM

## 2012-01-28 MED ORDER — IOHEXOL 300 MG/ML  SOLN
30.0000 mL | Freq: Once | INTRAMUSCULAR | Status: AC | PRN
  Administered 2012-01-28: 30 mL via ORAL

## 2012-01-31 ENCOUNTER — Encounter (INDEPENDENT_AMBULATORY_CARE_PROVIDER_SITE_OTHER): Payer: Self-pay | Admitting: General Surgery

## 2012-01-31 ENCOUNTER — Encounter (INDEPENDENT_AMBULATORY_CARE_PROVIDER_SITE_OTHER): Payer: Self-pay

## 2012-01-31 ENCOUNTER — Ambulatory Visit (INDEPENDENT_AMBULATORY_CARE_PROVIDER_SITE_OTHER): Payer: BC Managed Care – PPO | Admitting: General Surgery

## 2012-01-31 VITALS — BP 126/74 | HR 68 | Temp 97.4°F | Resp 14 | Ht 61.0 in | Wt 229.5 lb

## 2012-01-31 DIAGNOSIS — D126 Benign neoplasm of colon, unspecified: Secondary | ICD-10-CM

## 2012-01-31 DIAGNOSIS — C189 Malignant neoplasm of colon, unspecified: Secondary | ICD-10-CM | POA: Insufficient documentation

## 2012-01-31 DIAGNOSIS — K635 Polyp of colon: Secondary | ICD-10-CM

## 2012-01-31 NOTE — Progress Notes (Signed)
Patient ID: Jordan [name], female   DOB: 08-03-1951, 61 y.o.   MRN: [id number]  Chief Complaint  Patient presents with  . Follow-up    HPI Jordan [name] is a 61 y.o. female.  Referred to me by Dr Marisue Brooklyn versus current h unresectable col right colon mass. HPI   The patient has a family history of colon cancer in her paternal grandmother who died of colon cancer and a brother who was recently diagnosed with colon cancer. She had gone for routine colonoscopy when this right colon polyp was noted. Biopsy rescults are pending.Subsequently the patient has had a CT scan of the abdomen and pelvis which failed to demonstrate any evidence of widespread disease or metastatic disease.Past Medical History  Diagnosis Date  . Hypertension   . Hyperlipidemia   . Thyroid disease   . Nasal congestion   . Leg swelling   . Constipation   . Nausea   . Generalized headaches     due to allergies, sinus    Past Surgical History  Procedure Date  . Cesarean section 1968, 1970  . Knee surgery 1998    right - arthroscopic    Family History  Problem Relation Age of Onset  . Cancer Mother     uterine  . Cancer Father     prostate  . Cancer Brother     colon  . Cancer Maternal Aunt     breast  . Cancer Maternal Grandmother     breast  . Cancer Paternal Grandmother     colon  . Cancer Brother     prostate  . Cancer Brother     prostate  . Cancer Brother     prostate    Social History History  Substance Use Topics  . Smoking status: Never Smoker   . Smokeless tobacco: Never Used  . Alcohol Use: No    No Known Allergies  Current Outpatient Prescriptions  Medication Sig Dispense Refill  . acetaminophen (TYLENOL) 325 MG tablet Take 650 mg by mouth every 6 (six) hours as needed.      Marland Kitchen aluminum-magnesium hydroxide-simethicone (MAALOX) 200-200-20 MG/5ML SUSP Take 30 mLs by mouth as needed.      Marland Kitchen aspirin 81 MG tablet Take 81 mg by mouth daily.      Marland Kitchen buPROPion (WELLBUTRIN XL)  150 MG 24 hr tablet Take 150 mg by mouth daily.      Marland Kitchen ezetimibe (ZETIA) 10 MG tablet Take 10 mg by mouth daily.      . fluticasone (FLONASE) 50 MCG/ACT nasal spray Place 2 sprays into the nose Once daily as needed.      . Homeopathic Products (SINUS MEDICINE PO) Take by mouth as needed.      Marland Kitchen ibuprofen (ADVIL,MOTRIN) 200 MG tablet Take 200 mg by mouth every 6 (six) hours as needed.      Marland Kitchen levothyroxine (SYNTHROID, LEVOTHROID) 125 MCG tablet Take 125 mcg by mouth daily.      . metoprolol succinate (TOPROL-XL) 25 MG 24 hr tablet Take 25 mg by mouth daily.      . nitroGLYCERIN (NITROSTAT) 0.4 MG SL tablet Place 0.4 mg under the tongue every 5 (five) minutes as needed.      . pantoprazole (PROTONIX) 40 MG tablet Take 40 mg by mouth daily.      . simvastatin (ZOCOR) 40 MG tablet Take 40 mg by mouth every evening.      . triamterene-hydrochlorothiazide (MAXZIDE-25) 37.5-25 MG per tablet TAKE 1  TABLET BY MOUTH DAILY  30 tablet  4    Review of Systems Review of Systems  Constitutional: Negative.   HENT: Negative.   Eyes: Negative.   Respiratory: Negative.   Cardiovascular: Negative.   Gastrointestinal: Positive for constipation. Negative for diarrhea, blood in stool (chronic) and rectal pain.  Genitourinary: Negative.   Musculoskeletal: Negative.   Neurological: Negative.   Hematological: Negative.   Psychiatric/Behavioral: Negative.     Blood pressure 126/74, pulse 68, temperature 97.4 F (36.3 C), temperature source Temporal, resp. rate 14, height 5\' 1"  (1.549 m), weight 229 lb 8 oz (104.101 kg).  Physical Exam Physical Exam  Constitutional: She is oriented to person, place, and time. She appears well-developed and well-nourished.  HENT:  Head: Normocephalic and atraumatic.  Eyes: Conjunctivae and EOM are normal. Pupils are equal, round, and reactive to light.  Neck: Normal range of motion. Neck supple.  Cardiovascular: Normal rate and normal heart sounds.   Pulmonary/Chest: Effort  normal and breath sounds normal.  Abdominal: Soft. Bowel sounds are normal.  Musculoskeletal: Normal range of motion.  Neurological: She is alert and oriented to person, place, and time. She has normal reflexes.  Skin: Skin is warm and dry.  Psychiatric: She has a normal mood and affect. Her behavior is normal. Judgment and thought content normal.    Data Reviewed Colonoscopy, labs, CT scan  Assessment    Right colon unresectable polyp by colonsoscopy  Pathology is pending.    Plan    Right Colectomy through right transverse incision in the near future. Risks and benefits have been explained to the patient, and she wishes to proceed.       Cherylynn Ridges 01/31/2012, 4:06 PM

## 2012-02-08 ENCOUNTER — Telehealth (INDEPENDENT_AMBULATORY_CARE_PROVIDER_SITE_OTHER): Payer: Self-pay | Admitting: General Surgery

## 2012-02-08 NOTE — Telephone Encounter (Signed)
The patient contacted the office wanting to discuss with Dr Lindie Spruce her pathology report, she stated she has decided to have the pre cancerous polyp removed as well. I explained that Dr Lindie Spruce and his nurse are not in the office and that I would forward on a message to them to call her regarding this. Her surgery is scheduled 03/30/12

## 2012-02-22 ENCOUNTER — Telehealth (INDEPENDENT_AMBULATORY_CARE_PROVIDER_SITE_OTHER): Payer: Self-pay | Admitting: General Surgery

## 2012-02-22 NOTE — Telephone Encounter (Signed)
Home review the pathology on this patient. Her descending colon polyp demonstrated a tubular adenoma with high-grade dysplasia. However after discussing this with her gastroenterologist it was felt as the back of this polyp had been removed and no further surgery was necessary in this area.  Her sodium was planned for a right colon partial resection. We will continue to perform that. No need to modify her procedure or her orders.

## 2012-02-23 ENCOUNTER — Encounter (INDEPENDENT_AMBULATORY_CARE_PROVIDER_SITE_OTHER): Payer: Self-pay

## 2012-02-24 ENCOUNTER — Other Ambulatory Visit: Payer: Self-pay | Admitting: Physician Assistant

## 2012-02-29 ENCOUNTER — Telehealth: Payer: Self-pay | Admitting: Physician Assistant

## 2012-02-29 ENCOUNTER — Encounter (INDEPENDENT_AMBULATORY_CARE_PROVIDER_SITE_OTHER): Payer: Self-pay

## 2012-02-29 MED ORDER — BUPROPION HCL ER (XL) 150 MG PO TB24: 150.0000 mg | ORAL_TABLET | Freq: Every day | ORAL | Status: DC

## 2012-02-29 MED ORDER — METOPROLOL SUCCINATE ER 25 MG PO TB24: 25.0000 mg | ORAL_TABLET | Freq: Every day | ORAL | Status: DC

## 2012-02-29 NOTE — Telephone Encounter (Signed)
Got a fax from pharmacy about needing medication refills.  Will refill medications.

## 2012-03-13 ENCOUNTER — Encounter (HOSPITAL_COMMUNITY): Payer: Self-pay | Admitting: Pharmacy Technician

## 2012-03-21 ENCOUNTER — Encounter (HOSPITAL_COMMUNITY): Payer: Self-pay

## 2012-03-21 ENCOUNTER — Encounter (HOSPITAL_COMMUNITY)
Admission: RE | Admit: 2012-03-21 | Discharge: 2012-03-21 | Disposition: A | Discharge status: Home / Self Care | Payer: BC Managed Care – PPO | Source: Ambulatory Visit | Attending: General Surgery | Admitting: General Surgery

## 2012-03-21 HISTORY — DX: Depression, unspecified: F32.A

## 2012-03-21 HISTORY — DX: Dizziness and giddiness: R42

## 2012-03-21 HISTORY — DX: Other specified postprocedural states: Z98.890

## 2012-03-21 HISTORY — DX: Dorsalgia, unspecified: M54.9

## 2012-03-21 HISTORY — DX: Unspecified hemorrhoids: K64.9

## 2012-03-21 HISTORY — DX: Allergy status to unspecified drugs, medicaments and biological substances: Z88.9

## 2012-03-21 HISTORY — DX: Other specified postprocedural states: R11.2

## 2012-03-21 HISTORY — DX: Pneumonia, unspecified organism: J18.9

## 2012-03-21 HISTORY — DX: Other specified diseases of intestine: K63.89

## 2012-03-21 HISTORY — DX: Personal history of colon polyps, unspecified: Z86.0100

## 2012-03-21 HISTORY — DX: Hypothyroidism, unspecified: E03.9

## 2012-03-21 HISTORY — DX: Major depressive disorder, single episode, unspecified: F32.9

## 2012-03-21 HISTORY — DX: Personal history of urinary (tract) infections: Z87.440

## 2012-03-21 HISTORY — DX: Personal history of other diseases of the respiratory system: Z87.09

## 2012-03-21 HISTORY — DX: Personal history of colonic polyps: Z86.010

## 2012-03-21 HISTORY — DX: Pain in unspecified joint: M25.50

## 2012-03-21 HISTORY — DX: Personal history of other diseases of the nervous system and sense organs: Z86.69

## 2012-03-21 HISTORY — DX: Gastro-esophageal reflux disease without esophagitis: K21.9

## 2012-03-21 HISTORY — DX: Malignant (primary) neoplasm, unspecified: C80.1

## 2012-03-21 LAB — DIFFERENTIAL
Basophils Absolute: 0 10*3/uL (ref 0.0–0.1)
Basophils Relative: 0 % (ref 0–1)
Eosinophils Absolute: 0.4 10*3/uL (ref 0.0–0.7)
Monocytes Absolute: 0.6 10*3/uL (ref 0.1–1.0)
Neutro Abs: 5.3 10*3/uL (ref 1.7–7.7)

## 2012-03-21 LAB — CBC
HCT: 40.3 % (ref 36.0–46.0)
Hemoglobin: 12.8 g/dL (ref 12.0–15.0)
MCV: 76.3 fL — ABNORMAL LOW (ref 78.0–100.0)
WBC: 8.9 10*3/uL (ref 4.0–10.5)

## 2012-03-21 LAB — COMPREHENSIVE METABOLIC PANEL
AST: 21 U/L (ref 0–37)
Alkaline Phosphatase: 65 U/L (ref 39–117)
BUN: 13 mg/dL (ref 6–23)
CO2: 28 mEq/L (ref 19–32)
Chloride: 104 mEq/L (ref 96–112)
Creatinine, Ser: 1.03 mg/dL (ref 0.50–1.10)
GFR calc non Af Amer: 57 mL/min — ABNORMAL LOW (ref >90)
Total Bilirubin: 0.4 mg/dL (ref 0.3–1.2)

## 2012-03-21 LAB — TYPE AND SCREEN

## 2012-03-21 LAB — APTT: aPTT: 29 seconds (ref 24–37)

## 2012-03-21 NOTE — Pre-Procedure Instructions (Signed)
20 Jordan [name]  03/21/2012   Your procedure is scheduled on:  Thurs, Aug 15 @ 8:30 AM  Report to [facility] at 6:30 AM.  Call this number if you have problems the morning of surgery: [phone]   Remember:   Do not eat food:After Midnight.  Take these medicines the morning of surgery with A SIP OF WATER: Bupropion(Wellbutrin),Levothyroxine(Synthroid),Metoprolol(Toprol),and Protonix(Pantoprazole)   Do not wear jewelry, make-up or nail polish.  Do not wear lotions, powders, or perfumes.   Do not shave 48 hours prior to surgery.  Do not bring valuables to the hospital.  Contacts, dentures or bridgework may not be worn into surgery.  Leave suitcase in the car. After surgery it may be brought to your room.  For patients admitted to the hospital, checkout time is 11:00 AM the day of discharge.   Patients discharged the day of surgery will not be allowed to drive home.    Special Instructions: CHG Shower Use Special Wash: 1/2 bottle night before surgery and 1/2 bottle morning of surgery.   Please read over the following fact sheets that you were given: Pain Booklet, Coughing and Deep Breathing, Blood Transfusion Information, MRSA Information and Surgical Site Infection Prevention

## 2012-03-21 NOTE — Progress Notes (Signed)
[facility]-Dr.Hilty is cardiologist with last visit   Stress test and echo done in 2012 with results in epic Heart cath in 2007-results in epic  Medical MD is Dr.Shell Tinnie Gens on [facility] [address]  cxr and ekg >3yr ago

## 2012-03-22 NOTE — Consult Note (Signed)
Anesthesia Chart Review:  Patient is a 61 year old female scheduled for right colectomy for dysplastic right colon polyp on 03/30/12 by Dr. Lindie Spruce.  History includes morbid obesity with BMI 42.72, post-operative N/V, non-smoker, HLD, GERD, hypothyroidism, HTN, migraines, vertigo, depression, PNA, atypical chest pain in '07 and '12 with negative cardiac work-up.  PCP is Dr. Lynnda Child.    Normal CXR on 03/21/12.  EKG on 03/21/12 showed NSR, LAD, nonspecific T wave abnormality.  She has been evaluated by Dr. Rennis [facility]) in the past, last in August 2012.  She had a normal nuclear stress test, EF 59% on 03/10/11.  Cardiac cath on 09/07/05 showed normal coronary arteries with EF 60%.  Echo on 03/09/11 showed normal LV systolic function, EF 60-65%, grade 1 diastolic dysfunction, LA size upper limits of normal.  Labs noted.  K 3.1, Cr 1.03, H/H 12.8/40.3, PLT 339. Coags WNL.  Anticipate she can proceed as planned.  Shonna Chock, PA-C

## 2012-03-23 ENCOUNTER — Telehealth (INDEPENDENT_AMBULATORY_CARE_PROVIDER_SITE_OTHER): Payer: Self-pay

## 2012-03-23 NOTE — Telephone Encounter (Signed)
The patient called with questions about her bowel prep.  She has instructions for using Magnesium Citrate and also has one for a gallon drink maybe Nulytely.  I told her I will have Marcelino Duster to clarify and call her back with the information.

## 2012-03-24 NOTE — Telephone Encounter (Signed)
Spoke to Jordan [name] regarding her prep and surgery. Per Dr. Lindie Spruce she can disregard the nulytly and only do the 2 day prep with magnesium citrate and antibiotics. Also I told her that Dr Lindie Spruce doesn't want to do surgery for the second polyp because it is on the opposite side of colon that she is scheduled to have surgery on. That would have to be a second surgery at a different time if it needed it be done. If he performed them at the same time it would be 2 resection places and that would put her at high risk for a leak. The risk of complications is too high so surgery is on as scheduled at the one original site. Patient understands. She will call with any other questions.

## 2012-03-28 ENCOUNTER — Other Ambulatory Visit: Payer: Self-pay | Admitting: Physician Assistant

## 2012-03-28 NOTE — Telephone Encounter (Signed)
Needs office visit and labs.

## 2012-03-29 MED ORDER — DEXTROSE 5 % IV SOLN
2.0000 g | INTRAVENOUS | Status: AC
  Administered 2012-03-30: 2 g via INTRAVENOUS
  Filled 2012-03-29: qty 2

## 2012-03-29 MED ORDER — ALVIMOPAN 12 MG PO CAPS
12.0000 mg | ORAL_CAPSULE | Freq: Once | ORAL | Status: AC
  Administered 2012-03-30: 12 mg via ORAL
  Filled 2012-03-29: qty 1

## 2012-03-30 ENCOUNTER — Inpatient Hospital Stay (HOSPITAL_COMMUNITY): Payer: BC Managed Care – PPO | Admitting: Vascular Surgery

## 2012-03-30 ENCOUNTER — Inpatient Hospital Stay (HOSPITAL_COMMUNITY)
Admission: EM | Admit: 2012-03-30 | Discharge: 2012-04-03 | DRG: 149 | Disposition: A | Discharge status: Home / Self Care | Payer: BC Managed Care – PPO | Source: Ambulatory Visit | Attending: General Surgery | Admitting: General Surgery

## 2012-03-30 ENCOUNTER — Encounter (HOSPITAL_COMMUNITY)
Admission: EM | Disposition: A | Discharge status: Home / Self Care | Payer: Self-pay | Source: Ambulatory Visit | Attending: General Surgery

## 2012-03-30 ENCOUNTER — Encounter (HOSPITAL_COMMUNITY): Payer: Self-pay | Admitting: Vascular Surgery

## 2012-03-30 ENCOUNTER — Encounter (HOSPITAL_COMMUNITY): Payer: Self-pay | Admitting: *Deleted

## 2012-03-30 DIAGNOSIS — D378 Neoplasm of uncertain behavior of other specified digestive organs: Secondary | ICD-10-CM

## 2012-03-30 DIAGNOSIS — E785 Hyperlipidemia, unspecified: Secondary | ICD-10-CM | POA: Diagnosis present

## 2012-03-30 DIAGNOSIS — Z79899 Other long term (current) drug therapy: Secondary | ICD-10-CM

## 2012-03-30 DIAGNOSIS — R51 Headache: Secondary | ICD-10-CM | POA: Diagnosis present

## 2012-03-30 DIAGNOSIS — D375 Neoplasm of uncertain behavior of rectum: Secondary | ICD-10-CM

## 2012-03-30 DIAGNOSIS — F329 Major depressive disorder, single episode, unspecified: Secondary | ICD-10-CM | POA: Diagnosis present

## 2012-03-30 DIAGNOSIS — D126 Benign neoplasm of colon, unspecified: Principal | ICD-10-CM | POA: Diagnosis present

## 2012-03-30 DIAGNOSIS — Z800 Family history of malignant neoplasm of digestive organs: Secondary | ICD-10-CM

## 2012-03-30 DIAGNOSIS — D371 Neoplasm of uncertain behavior of stomach: Secondary | ICD-10-CM

## 2012-03-30 DIAGNOSIS — K219 Gastro-esophageal reflux disease without esophagitis: Secondary | ICD-10-CM | POA: Diagnosis present

## 2012-03-30 DIAGNOSIS — Z7982 Long term (current) use of aspirin: Secondary | ICD-10-CM

## 2012-03-30 DIAGNOSIS — I10 Essential (primary) hypertension: Secondary | ICD-10-CM | POA: Diagnosis present

## 2012-03-30 DIAGNOSIS — Z8701 Personal history of pneumonia (recurrent): Secondary | ICD-10-CM

## 2012-03-30 DIAGNOSIS — K635 Polyp of colon: Secondary | ICD-10-CM

## 2012-03-30 DIAGNOSIS — F3289 Other specified depressive episodes: Secondary | ICD-10-CM | POA: Diagnosis present

## 2012-03-30 DIAGNOSIS — E039 Hypothyroidism, unspecified: Secondary | ICD-10-CM | POA: Diagnosis present

## 2012-03-30 HISTORY — PX: PARTIAL COLECTOMY: SHX5273

## 2012-03-30 SURGERY — COLECTOMY, PARTIAL
Anesthesia: General | Site: Abdomen | Wound class: Clean Contaminated

## 2012-03-30 MED ORDER — DIPHENHYDRAMINE HCL 50 MG/ML IJ SOLN: 12.5000 mg | Freq: Four times a day (QID) | INTRAMUSCULAR | Status: DC | PRN

## 2012-03-30 MED ORDER — KCL IN DEXTROSE-NACL 20-5-0.45 MEQ/L-%-% IV SOLN
INTRAVENOUS | Status: AC
  Filled 2012-03-30: qty 1000

## 2012-03-30 MED ORDER — ALVIMOPAN 12 MG PO CAPS
12.0000 mg | ORAL_CAPSULE | Freq: Two times a day (BID) | ORAL | Status: DC
  Administered 2012-03-31 – 2012-04-03 (×7): 12 mg via ORAL
  Filled 2012-03-30 (×8): qty 1

## 2012-03-30 MED ORDER — LACTATED RINGERS IV SOLN
INTRAVENOUS | Status: DC | PRN
  Administered 2012-03-30: 09:00:00 via INTRAVENOUS

## 2012-03-30 MED ORDER — DEXTROSE 5 % IV SOLN
1.0000 g | Freq: Four times a day (QID) | INTRAVENOUS | Status: AC
  Administered 2012-03-30: 1 g via INTRAVENOUS
  Filled 2012-03-30: qty 1

## 2012-03-30 MED ORDER — POVIDONE-IODINE 10 % EX OINT: TOPICAL_OINTMENT | CUTANEOUS | Status: DC | PRN

## 2012-03-30 MED ORDER — HYDROMORPHONE 0.3 MG/ML IV SOLN
INTRAVENOUS | Status: DC
  Administered 2012-03-30: 0.6 mg via INTRAVENOUS
  Administered 2012-03-30: 11:00:00 via INTRAVENOUS
  Administered 2012-03-30: 1.19 mg via INTRAVENOUS
  Administered 2012-03-30: 1.39 mg via INTRAVENOUS
  Administered 2012-03-31: 0.599 mg via INTRAVENOUS
  Administered 2012-03-31 (×3): 1.19 mg via INTRAVENOUS
  Administered 2012-03-31: 1.59 mg via INTRAVENOUS
  Administered 2012-04-01: 0.6 mg via INTRAVENOUS
  Administered 2012-04-01: 14:00:00 via INTRAVENOUS
  Administered 2012-04-01: 1.19 mg via INTRAVENOUS
  Administered 2012-04-01: 0.78 mg via INTRAVENOUS
  Administered 2012-04-01: 0.799 mg via INTRAVENOUS
  Administered 2012-04-01 – 2012-04-02 (×2): 0.599 mg via INTRAVENOUS
  Administered 2012-04-02: 0.6 mg via INTRAVENOUS
  Administered 2012-04-02: 0.2 mg via INTRAVENOUS
  Filled 2012-03-30 (×2): qty 25

## 2012-03-30 MED ORDER — SIMVASTATIN 40 MG PO TABS
40.0000 mg | ORAL_TABLET | Freq: Every day | ORAL | Status: DC
  Administered 2012-03-30 – 2012-04-02 (×4): 40 mg via ORAL
  Filled 2012-03-30 (×5): qty 1

## 2012-03-30 MED ORDER — LACTATED RINGERS IV SOLN
INTRAVENOUS | Status: DC | PRN
  Administered 2012-03-30 (×2): via INTRAVENOUS

## 2012-03-30 MED ORDER — ENOXAPARIN SODIUM 40 MG/0.4ML ~~LOC~~ SOLN
40.0000 mg | Freq: Every day | SUBCUTANEOUS | Status: DC
  Administered 2012-03-30 – 2012-04-02 (×4): 40 mg via SUBCUTANEOUS
  Filled 2012-03-30 (×5): qty 0.4

## 2012-03-30 MED ORDER — HYDROMORPHONE 0.3 MG/ML IV SOLN
INTRAVENOUS | Status: AC
  Filled 2012-03-30: qty 25

## 2012-03-30 MED ORDER — TRIAMTERENE-HCTZ 37.5-25 MG PO TABS
1.0000 | ORAL_TABLET | Freq: Every day | ORAL | Status: DC
  Administered 2012-03-30 – 2012-04-03 (×5): 1 via ORAL
  Filled 2012-03-30 (×5): qty 1

## 2012-03-30 MED ORDER — POVIDONE-IODINE 10 % EX OINT
TOPICAL_OINTMENT | CUTANEOUS | Status: AC
  Filled 2012-03-30: qty 28.35

## 2012-03-30 MED ORDER — BUPIVACAINE-EPINEPHRINE PF 0.25-1:200000 % IJ SOLN
INTRAMUSCULAR | Status: DC | PRN
  Administered 2012-03-30: 10 mL

## 2012-03-30 MED ORDER — HYDROMORPHONE HCL PF 1 MG/ML IJ SOLN: 0.2500 mg | INTRAMUSCULAR | Status: DC | PRN

## 2012-03-30 MED ORDER — METOPROLOL SUCCINATE ER 25 MG PO TB24
25.0000 mg | ORAL_TABLET | Freq: Every day | ORAL | Status: DC
  Administered 2012-03-31 – 2012-04-03 (×4): 25 mg via ORAL
  Filled 2012-03-30 (×4): qty 1

## 2012-03-30 MED ORDER — PHENYLEPHRINE HCL 10 MG/ML IJ SOLN
INTRAMUSCULAR | Status: DC | PRN
  Administered 2012-03-30 (×3): 80 ug via INTRAVENOUS
  Administered 2012-03-30: 40 ug via INTRAVENOUS
  Administered 2012-03-30: 80 ug via INTRAVENOUS

## 2012-03-30 MED ORDER — NALOXONE HCL 0.4 MG/ML IJ SOLN: 0.4000 mg | INTRAMUSCULAR | Status: DC | PRN

## 2012-03-30 MED ORDER — PHENYLEPHRINE HCL 10 MG/ML IJ SOLN
10.0000 mg | INTRAVENOUS | Status: DC | PRN
  Administered 2012-03-30: 50 ug/min via INTRAVENOUS

## 2012-03-30 MED ORDER — NEOSTIGMINE METHYLSULFATE 1 MG/ML IJ SOLN
INTRAMUSCULAR | Status: DC | PRN
  Administered 2012-03-30: 5 mg via INTRAVENOUS

## 2012-03-30 MED ORDER — 0.9 % SODIUM CHLORIDE (POUR BTL) OPTIME
TOPICAL | Status: DC | PRN
  Administered 2012-03-30 (×2): 1000 mL

## 2012-03-30 MED ORDER — LEVOTHYROXINE SODIUM 125 MCG PO TABS
125.0000 ug | ORAL_TABLET | Freq: Every day | ORAL | Status: DC
  Administered 2012-03-31 – 2012-04-03 (×4): 125 ug via ORAL
  Filled 2012-03-30 (×6): qty 1

## 2012-03-30 MED ORDER — ONDANSETRON HCL 4 MG/2ML IJ SOLN: 4.0000 mg | Freq: Four times a day (QID) | INTRAMUSCULAR | Status: DC | PRN

## 2012-03-30 MED ORDER — BUPROPION HCL ER (XL) 150 MG PO TB24
150.0000 mg | ORAL_TABLET | Freq: Every day | ORAL | Status: DC
  Administered 2012-04-01 – 2012-04-03 (×3): 150 mg via ORAL
  Filled 2012-03-30 (×5): qty 1

## 2012-03-30 MED ORDER — GLYCOPYRROLATE 0.2 MG/ML IJ SOLN
INTRAMUSCULAR | Status: DC | PRN
  Administered 2012-03-30: .8 mg via INTRAVENOUS

## 2012-03-30 MED ORDER — PROPOFOL 10 MG/ML IV EMUL
INTRAVENOUS | Status: DC | PRN
  Administered 2012-03-30: 200 mg via INTRAVENOUS

## 2012-03-30 MED ORDER — OXYCODONE-ACETAMINOPHEN 5-325 MG PO TABS
1.0000 | ORAL_TABLET | ORAL | Status: DC | PRN
  Administered 2012-04-01 (×2): 1 via ORAL
  Administered 2012-04-02 – 2012-04-03 (×4): 2 via ORAL
  Filled 2012-03-30: qty 2
  Filled 2012-03-30: qty 1
  Filled 2012-03-30 (×3): qty 2
  Filled 2012-03-30: qty 1

## 2012-03-30 MED ORDER — EZETIMIBE 10 MG PO TABS
10.0000 mg | ORAL_TABLET | Freq: Every day | ORAL | Status: DC
  Administered 2012-03-30 – 2012-04-03 (×5): 10 mg via ORAL
  Filled 2012-03-30 (×5): qty 1

## 2012-03-30 MED ORDER — ONDANSETRON HCL 4 MG/2ML IJ SOLN
INTRAMUSCULAR | Status: DC | PRN
  Administered 2012-03-30: 4 mg via INTRAVENOUS

## 2012-03-30 MED ORDER — BUPIVACAINE-EPINEPHRINE PF 0.25-1:200000 % IJ SOLN
INTRAMUSCULAR | Status: AC
  Filled 2012-03-30: qty 30

## 2012-03-30 MED ORDER — MIDAZOLAM HCL 5 MG/5ML IJ SOLN
INTRAMUSCULAR | Status: DC | PRN
  Administered 2012-03-30: 2 mg via INTRAVENOUS

## 2012-03-30 MED ORDER — ROCURONIUM BROMIDE 100 MG/10ML IV SOLN
INTRAVENOUS | Status: DC | PRN
  Administered 2012-03-30: 40 mg via INTRAVENOUS

## 2012-03-30 MED ORDER — SODIUM CHLORIDE 0.9 % IJ SOLN: 9.0000 mL | INTRAMUSCULAR | Status: DC | PRN

## 2012-03-30 MED ORDER — KCL IN DEXTROSE-NACL 20-5-0.45 MEQ/L-%-% IV SOLN
INTRAVENOUS | Status: DC
  Administered 2012-03-30 – 2012-03-31 (×3): via INTRAVENOUS
  Administered 2012-03-31: 1000 mL via INTRAVENOUS
  Administered 2012-04-01 – 2012-04-03 (×6): via INTRAVENOUS
  Filled 2012-03-30 (×12): qty 1000

## 2012-03-30 MED ORDER — NITROGLYCERIN 0.4 MG SL SUBL: 0.4000 mg | SUBLINGUAL_TABLET | SUBLINGUAL | Status: DC | PRN

## 2012-03-30 MED ORDER — LIDOCAINE HCL (CARDIAC) 20 MG/ML IV SOLN
INTRAVENOUS | Status: DC | PRN
  Administered 2012-03-30: 70 mg via INTRAVENOUS

## 2012-03-30 MED ORDER — DIPHENHYDRAMINE HCL 12.5 MG/5ML PO ELIX
12.5000 mg | ORAL_SOLUTION | Freq: Four times a day (QID) | ORAL | Status: DC | PRN
  Filled 2012-03-30: qty 5

## 2012-03-30 MED ORDER — FENTANYL CITRATE 0.05 MG/ML IJ SOLN
INTRAMUSCULAR | Status: DC | PRN
  Administered 2012-03-30 (×3): 50 ug via INTRAVENOUS

## 2012-03-30 SURGICAL SUPPLY — 69 items
ADH SKN CLS LQ APL DERMABOND (GAUZE/BANDAGES/DRESSINGS) ×1
APL SKNCLS STERI-STRIP NONHPOA (GAUZE/BANDAGES/DRESSINGS) ×1
BENZOIN TINCTURE PRP APPL 2/3 (GAUZE/BANDAGES/DRESSINGS) ×1 IMPLANT
BLADE SURG ROTATE 9660 (MISCELLANEOUS) IMPLANT
CANISTER SUCTION 2500CC (MISCELLANEOUS) ×2 IMPLANT
CHLORAPREP W/TINT 26ML (MISCELLANEOUS) ×2 IMPLANT
CLOTH BEACON ORANGE TIMEOUT ST (SAFETY) ×2 IMPLANT
COVER SURGICAL LIGHT HANDLE (MISCELLANEOUS) ×2 IMPLANT
DERMABOND ADHESIVE PROPEN (GAUZE/BANDAGES/DRESSINGS) ×1
DERMABOND ADVANCED .7 DNX6 (GAUZE/BANDAGES/DRESSINGS) IMPLANT
DRAPE LAPAROSCOPIC ABDOMINAL (DRAPES) ×2 IMPLANT
DRAPE PROXIMA HALF (DRAPES) IMPLANT
DRAPE UTILITY 15X26 W/TAPE STR (DRAPE) ×4 IMPLANT
DRAPE WARM FLUID 44X44 (DRAPE) ×2 IMPLANT
DRSG TEGADERM 4X4.75 (GAUZE/BANDAGES/DRESSINGS) ×2 IMPLANT
ELECT BLADE 4.0 EZ CLEAN MEGAD (MISCELLANEOUS) ×2
ELECT BLADE 6.5 EXT (BLADE) ×1 IMPLANT
ELECT CAUTERY BLADE 6.4 (BLADE) ×3 IMPLANT
ELECT REM PT RETURN 9FT ADLT (ELECTROSURGICAL) ×2
ELECTRODE BLDE 4.0 EZ CLN MEGD (MISCELLANEOUS) IMPLANT
ELECTRODE REM PT RTRN 9FT ADLT (ELECTROSURGICAL) ×1 IMPLANT
GLOVE BIO SURGEON STRL SZ7 (GLOVE) ×1 IMPLANT
GLOVE BIO SURGEON STRL SZ7.5 (GLOVE) ×1 IMPLANT
GLOVE BIOGEL PI IND STRL 7.5 (GLOVE) IMPLANT
GLOVE BIOGEL PI IND STRL 8 (GLOVE) ×1 IMPLANT
GLOVE BIOGEL PI INDICATOR 7.5 (GLOVE) ×3
GLOVE BIOGEL PI INDICATOR 8 (GLOVE) ×1
GLOVE ECLIPSE 7.5 STRL STRAW (GLOVE) ×4 IMPLANT
GOWN STRL NON-REIN LRG LVL3 (GOWN DISPOSABLE) ×6 IMPLANT
KIT BASIN OR (CUSTOM PROCEDURE TRAY) ×2 IMPLANT
KIT ROOM TURNOVER OR (KITS) ×2 IMPLANT
LEGGING LITHOTOMY PAIR STRL (DRAPES) IMPLANT
LIGASURE IMPACT 36 18CM CVD LR (INSTRUMENTS) ×1 IMPLANT
NDL HYPO 25GX1X1/2 BEV (NEEDLE) IMPLANT
NEEDLE HYPO 25GX1X1/2 BEV (NEEDLE) ×2 IMPLANT
NS IRRIG 1000ML POUR BTL (IV SOLUTION) ×3 IMPLANT
PACK GENERAL/GYN (CUSTOM PROCEDURE TRAY) ×2 IMPLANT
PAD ARMBOARD 7.5X6 YLW CONV (MISCELLANEOUS) ×4 IMPLANT
RELOAD PROXIMATE 75MM BLUE (ENDOMECHANICALS) ×4 IMPLANT
RELOAD STAPLE 75 3.8 BLU REG (ENDOMECHANICALS) IMPLANT
SPECIMEN JAR X LARGE (MISCELLANEOUS) ×2 IMPLANT
SPONGE GAUZE 4X4 12PLY (GAUZE/BANDAGES/DRESSINGS) ×2 IMPLANT
SPONGE LAP 18X18 X RAY DECT (DISPOSABLE) IMPLANT
STAPLER GUN LINEAR PROX 60 (STAPLE) ×1 IMPLANT
STAPLER PROXIMATE 75MM BLUE (STAPLE) ×1 IMPLANT
STAPLER VISISTAT 35W (STAPLE) ×2 IMPLANT
STRIP CLOSURE SKIN 1/2X4 (GAUZE/BANDAGES/DRESSINGS) ×2 IMPLANT
SUCTION POOLE TIP (SUCTIONS) ×2 IMPLANT
SURGILUBE 2OZ TUBE FLIPTOP (MISCELLANEOUS) IMPLANT
SUT MNCRL AB 3-0 PS2 18 (SUTURE) ×1 IMPLANT
SUT PDS AB 1 TP1 96 (SUTURE) ×6 IMPLANT
SUT PROLENE 2 0 CT2 30 (SUTURE) IMPLANT
SUT PROLENE 2 0 KS (SUTURE) IMPLANT
SUT SILK 2 0 SH CR/8 (SUTURE) ×2 IMPLANT
SUT SILK 2 0 TIES 10X30 (SUTURE) ×2 IMPLANT
SUT SILK 3 0 SH CR/8 (SUTURE) ×2 IMPLANT
SUT SILK 3 0 TIES 10X30 (SUTURE) ×2 IMPLANT
SUT VIC AB 3-0 SH 27 (SUTURE) ×4
SUT VIC AB 3-0 SH 27X BRD (SUTURE) ×2 IMPLANT
SUT VIC AB 3-0 SH 8-18 (SUTURE) ×1 IMPLANT
SYR CONTROL 10ML LL (SYRINGE) ×1 IMPLANT
TAPE CLOTH SURG 4X10 WHT LF (GAUZE/BANDAGES/DRESSINGS) ×1 IMPLANT
TOWEL OR 17X24 6PK STRL BLUE (TOWEL DISPOSABLE) ×2 IMPLANT
TOWEL OR 17X26 10 PK STRL BLUE (TOWEL DISPOSABLE) ×2 IMPLANT
TRAY FOLEY CATH 14FRSI W/METER (CATHETERS) ×1 IMPLANT
TRAY PROCTOSCOPIC FIBER OPTIC (SET/KITS/TRAYS/PACK) IMPLANT
UNDERPAD 30X30 INCONTINENT (UNDERPADS AND DIAPERS) IMPLANT
WATER STERILE IRR 1000ML POUR (IV SOLUTION) ×1 IMPLANT
YANKAUER SUCT BULB TIP NO VENT (SUCTIONS) ×2 IMPLANT

## 2012-03-30 NOTE — H&P (Signed)
HPI Jordan [name] is a 61 y.o. female.  Referred to me by Dr Marisue Brooklyn for the evaluation of a colonoscopically unresectable  right colon mass.  The patient has a family history of colon cancer in her paternal grandmother who died of colon cancer and a brother who was recently diagnosed with colon cancer. She had gone for routine colonoscopy when this right colon polyp was noted. Biopsy rescults are pending.Subsequently the patient has had a CT scan of the abdomen and pelvis which failed to demonstrate any evidence of widespread disease or metastatic disease  .Past Medical History    .  Hypertension     .  Hyperlipidemia     .  Thyroid disease     .  Nasal congestion     .  Leg swelling     .  Constipation     .  Nausea     .  Generalized headaches         due to allergies, sinus         Past Surgical History    .  Cesarean section  1968, 1970   .  Knee surgery  1998       right - arthroscopic      Family History   Problem  Relation  Age of Onset   .  Cancer  Mother         uterine   .  Cancer  Father         prostate   .  Cancer  Brother         colon   .  Cancer  Maternal Aunt         breast   .  Cancer  Maternal Grandmother         breast   .  Cancer  Paternal Grandmother         colon   .  Cancer  Brother         prostate   .  Cancer  Brother         prostate   .  Cancer  Brother         prostate     Social History History   Substance Use Topics   .  Smoking status:  Never Smoker    .  Smokeless tobacco:  Never Used   .  Alcohol Use:  No    No Known Allergies    Current Outpatient Prescriptions   Medication  Sig  Dispense  Refill   .  acetaminophen (TYLENOL) 325 MG tablet  Take 650 mg by mouth every 6 (six) hours as needed.         Marland Kitchen  aluminum-magnesium hydroxide-simethicone (MAALOX) 200-200-20 MG/5ML SUSP  Take 30 mLs by mouth as needed.         Marland Kitchen  aspirin 81 MG tablet  Take 81 mg by mouth daily.         Marland Kitchen  buPROPion (WELLBUTRIN XL) 150 MG 24  hr tablet  Take 150 mg by mouth daily.         Marland Kitchen  ezetimibe (ZETIA) 10 MG tablet  Take 10 mg by mouth daily.         .  fluticasone (FLONASE) 50 MCG/ACT nasal spray  Place 2 sprays into the nose Once daily as needed.         .  Homeopathic Products (SINUS MEDICINE PO)  Take by mouth as needed.         Marland Kitchen  ibuprofen (ADVIL,MOTRIN) 200 MG tablet  Take 200 mg by mouth every 6 (six) hours as needed.         Marland Kitchen  levothyroxine (SYNTHROID, LEVOTHROID) 125 MCG tablet  Take 125 mcg by mouth daily.         .  metoprolol succinate (TOPROL-XL) 25 MG 24 hr tablet  Take 25 mg by mouth daily.         .  nitroGLYCERIN (NITROSTAT) 0.4 MG SL tablet  Place 0.4 mg under the tongue every 5 (five) minutes as needed.         .  pantoprazole (PROTONIX) 40 MG tablet  Take 40 mg by mouth daily.         .  simvastatin (ZOCOR) 40 MG tablet  Take 40 mg by mouth every evening.         .  triamterene-hydrochlorothiazide (MAXZIDE-25) 37.5-25 MG per tablet  TAKE 1 TABLET BY MOUTH DAILY   30 tablet   4        Review of Systems Review of Systems  Constitutional: Negative.   HENT: Negative.   Eyes: Negative.   Respiratory: Negative.   Cardiovascular: Negative.   Gastrointestinal: Positive for constipation. Negative for diarrhea, blood in stool (chronic) and rectal pain.  Genitourinary: Negative.   Musculoskeletal: Negative.   Neurological: Negative.   Hematological: Negative.   Psychiatric/Behavioral: Negative.       Blood pressure 126/74, pulse 68, temperature 97.4 F (36.3 C), temperature source Temporal, resp. rate 14, height 5\' 1"  (1.549 m), weight 229 lb 8 oz (104.101 kg).   Physical Exam Physical Exam  Constitutional: She is oriented to person, place, and time. She appears well-developed and well-nourished.  HENT:   Head: Normocephalic and atraumatic.  Eyes: Conjunctivae and EOM are normal. Pupils are equal, round, and reactive to light.  Neck: Normal range of motion. Neck supple.  Cardiovascular: Normal  rate and normal heart sounds.   Pulmonary/Chest: Effort normal and breath sounds normal.  Abdominal: Soft. Bowel sounds are normal.  Musculoskeletal: Normal range of motion.  Neurological: She is alert and oriented to person, place, and time. She has normal reflexes.  Skin: Skin is warm and dry.  Psychiatric: She has a normal mood and affect. Her behavior is normal. Judgment and thought content normal.      Data Reviewed Colonoscopy, labs, CT scan   Assessment    Right colon unresectable polyp by colonsoscopy  Pathology is pending.     Plan    Right Colectomy through right transverse incision in the near future. Risks and benefits have been explained to the patient, and she wishes to proceed.

## 2012-03-30 NOTE — Progress Notes (Signed)
Spoke with Dr Lindie Spruce and informed him that pt unable to take last dose of antibiotics scheduled for last evening due to emesis. He stated that was OK

## 2012-03-30 NOTE — Progress Notes (Signed)
rec'd report from [facility], assuming care of patient at this time

## 2012-03-30 NOTE — Anesthesia Postprocedure Evaluation (Signed)
Anesthesia Post Note  Patient: Jordan [name]  Procedure(s) Performed: Procedure(s) (LRB): PARTIAL COLECTOMY (N/A)  Anesthesia type: General  Patient location: PACU  Post pain: Pain level controlled and Adequate analgesia  Post assessment: Post-op Vital signs reviewed, Patient's Cardiovascular Status Stable, Respiratory Function Stable, Patent Airway and Pain level controlled  Last Vitals:  Filed Vitals:   03/30/12 1145  BP: 110/66  Pulse: 81  Temp: 36.4 C  Resp: 20    Post vital signs: Reviewed and stable  Level of consciousness: awake, alert  and oriented  Complications: No apparent anesthesia complications

## 2012-03-30 NOTE — Preoperative (Signed)
Beta Blockers   Reason not to administer Beta Blockers:Not Applicable 

## 2012-03-30 NOTE — Interval H&P Note (Signed)
History and Physical Interval Note:  03/30/2012 7:40 AM  Jordan [name]  has presented today for surgery, with the diagnosis of Right colon /cecal polyp  The various methods of treatment have been discussed with the patient and family. After consideration of risks, benefits and other options for treatment, the patient has consented to  Procedure(s) (LRB): PARTIAL COLECTOMY (N/A) as a surgical intervention .  The patient's history has been reviewed, patient examined, no change in status, stable for surgery.  I have reviewed the patient's chart and labs.  Questions were answered to the patient's satisfaction.     Bertha Earwood, Fayrene Fearing O  The patient had some nausea with her last dose of PO antibiotics last night.  Not a concern.  Will proceed.  Marta Lamas. Gae Bon, MD, FACS [phone] [facility]

## 2012-03-30 NOTE — Transfer of Care (Signed)
Immediate Anesthesia Transfer of Care Note  Patient: Jordan [name]  Procedure(s) Performed: Procedure(s) (LRB): PARTIAL COLECTOMY (N/A)  Patient Location: PACU  Anesthesia Type: General  Level of Consciousness: awake, alert  and oriented  Airway & Oxygen Therapy: Patient Spontanous Breathing and Patient connected to nasal cannula oxygen  Post-op Assessment: Report given to PACU RN and Post -op Vital signs reviewed and stable  Post vital signs: Reviewed and stable  Complications: No apparent anesthesia complications

## 2012-03-30 NOTE — Anesthesia Preprocedure Evaluation (Addendum)
Anesthesia Evaluation  Patient identified by MRN, date of birth, ID band Patient awake    Reviewed: Allergy & Precautions, H&P , NPO status , Patient's Chart, lab work & pertinent test results, reviewed documented beta blocker date and time   History of Anesthesia Complications (+) PONV  Airway Mallampati: III TM Distance: >3 FB Neck ROM: Full    Dental  (+) Dental Advisory Given and Teeth Intact   Pulmonary pneumonia -, resolved,          Cardiovascular hypertension, Pt. on home beta blockers     Neuro/Psych  Headaches, Depression    GI/Hepatic Neg liver ROS, GERD-  Controlled,  Endo/Other  Hypothyroidism   Renal/GU negative Renal ROS     Musculoskeletal   Abdominal   Peds  Hematology   Anesthesia Other Findings   Reproductive/Obstetrics                         Anesthesia Physical Anesthesia Plan  ASA: III  Anesthesia Plan: General   Post-op Pain Management:    Induction: Intravenous  Airway Management Planned: Oral ETT  Additional Equipment:   Intra-op Plan:   Post-operative Plan: Possible Post-op intubation/ventilation  Informed Consent:   Plan Discussed with: CRNA, Anesthesiologist and Surgeon  Anesthesia Plan Comments:         Anesthesia Quick Evaluation

## 2012-03-30 NOTE — Op Note (Signed)
OPERATIVE REPORT  DATE OF OPERATION: 03/30/2012  PATIENT:  Jordan [name]  61 y.o. female  PRE-OPERATIVE DIAGNOSIS:  Right colon /cecal polyp  POST-OPERATIVE DIAGNOSIS:  Right colon /cecal polyp  PROCEDURE:  Procedure(s): PARTIAL COLECTOMY  SURGEON:  Surgeon(s): Cherylynn Ridges, MD  ASSISTANT: None  ANESTHESIA:   general  EBL: <100 ml  BLOOD ADMINISTERED: none  DRAINS: Urinary Catheter (Foley)   SPECIMEN:  Source of Specimen:  terminal ileum, cecum and right colon  COUNTS CORRECT:  YES  PROCEDURE DETAILS: The patient was taken to the operating room and placed on the table in the supine position. After an adequate general endotracheal anesthetic was administered she was prepped and draped in usual sterile manner exposing the entire abdomen.  After a proper time out was performed identifying the patient and the procedure to be performed a right transverse abdominal incision was made at the level of the umbilicus. It was taken down through the subcutaneous tissue through the anterior rectus sheath, the rectus muscle, and the posterior rectus sheath into the peritoneal cavity. This was done without apparent injury to any of the abdominal contents.  The patient was placed with the left side down position. A Balfour retractor was used to provide exposure. We mobilized right colon along the line of Toldt up to and including the hepatic flexure. The appendix was not identified. We mobilized the terminal ileum and the cecum. After this was done so of the palpable tumor was noted to be in the proximal descending colon. A GIA-75 stapler was placed across the distal descending colon and the terminal ileum. The mesentery was subsequently taken with a LigaSure device.  Once the colon was resected a primary side to side functional end-to-end anastomosis was made between the distal ileum and the distal ascending colon using a GIA-75 stapler. The enterotomy was closed using a TA 60 stapler. The  mesentery was closed using interrupted 2-0 silk sutures. Once this was done the surgeon's gloves were changed. Prior to doing so we inspected the specimen off the field and marked the distal margin using a suture. The colon was opened and the polypoid tumor was noted in the descending colon. With new gloves and gowns applied. We irrigated the right lower quadrant using saline solution. We then closed the fascia in 2 layers. The posterior and anterior rectus sheath fascia were closed using running looped 0 PDS suture. We irrigated the subcutaneous tissue with saline and closed the skin using a running subcuticular stitch of 3-0 Monocryl. Prior to doing so we injected the subcutaneous and subcuticular layer with quarter percent Marcaine with epinephrine.  All needle counts, as sponge counts, and instrument counts were correct.  PATIENT DISPOSITION:  PACU - hemodynamically stable.   Cherylynn Ridges 8/15/201310:32 AM

## 2012-03-31 ENCOUNTER — Encounter (HOSPITAL_COMMUNITY): Payer: Self-pay | Admitting: General Surgery

## 2012-03-31 LAB — BASIC METABOLIC PANEL
CO2: 20 mEq/L (ref 19–32)
Calcium: 8.2 mg/dL — ABNORMAL LOW (ref 8.4–10.5)
Chloride: 102 mEq/L (ref 96–112)
Potassium: 3.8 mEq/L (ref 3.5–5.1)
Sodium: 138 mEq/L (ref 135–145)

## 2012-03-31 LAB — CBC
MCV: 77.1 fL — ABNORMAL LOW (ref 78.0–100.0)
Platelets: 281 10*3/uL (ref 150–400)
RBC: 4.58 MIL/uL (ref 3.87–5.11)
WBC: 14.9 10*3/uL — ABNORMAL HIGH (ref 4.0–10.5)

## 2012-03-31 MED ORDER — WHITE PETROLATUM GEL
Status: AC
  Administered 2012-03-31: 11:00:00
  Filled 2012-03-31: qty 5

## 2012-03-31 MED ORDER — POTASSIUM CHLORIDE 10 MEQ/100ML IV SOLN
10.0000 meq | INTRAVENOUS | Status: AC
  Administered 2012-03-31 (×3): 10 meq via INTRAVENOUS
  Filled 2012-03-31: qty 300

## 2012-03-31 NOTE — Progress Notes (Signed)
GS Progress Note Subjective: Patient sleepy this morning, but says that she was up last night.  Very coherent.  Not having too much pain.  Objective: Vital signs in last 24 hours: Temp:  [97.5 F (36.4 C)-98.2 F (36.8 C)] 98.2 F (36.8 C) (08/16 0546) Pulse Rate:  [74-88] 83  (08/16 0546) Resp:  [10-21] 18  (08/16 0546) BP: (97-119)/(61-70) 108/61 mmHg (08/16 0546) SpO2:  [96 %-100 %] 98 % (08/16 0546) Weight:  [102.513 kg (226 lb)] 102.513 kg (226 lb) (08/15 1543) Last BM Date: 03/29/12  Intake/Output from previous day: 08/15 0701 - 08/16 0700 In: 4106.3 [I.V.:4106.3] Out: 1325 [Urine:1265; Blood:60] Intake/Output this shift:    Lungs: Clear  Abd: Soft, mildly distended, hypoactive bowel sounds.  Extremities: No DVT signs or symptoms.  Neuro: Intact  Lab Results: CBC   Basename 03/31/12 0611  WBC 14.9*  HGB 11.2*  HCT 35.3*  PLT 281   BMET No results found for this basename: NA:2,K:2,CL:2,CO2:2,GLUCOSE:2,BUN:2,CREATININE:2,CALCIUM:2 in the last 72 hours PT/INR No results found for this basename: LABPROT:2,INR:2 in the last 72 hours ABG No results found for this basename: PHART:2,PCO2:2,PO2:2,HCO3:2 in the last 72 hours  Studies/Results: No results found.  Anti-infectives: Anti-infectives     Start     Dose/Rate Route Frequency Ordered Stop   03/30/12 1400   cefOXitin (MEFOXIN) 1 g in dextrose 5 % 50 mL IVPB        1 g 100 mL/hr over 30 Minutes Intravenous 4 times per day 03/30/12 1217 03/30/12 1611   03/29/12 1414   cefOXitin (MEFOXIN) 2 g in dextrose 5 % 50 mL IVPB        2 g 100 mL/hr over 30 Minutes Intravenous 60 min pre-op 03/29/12 1414 03/30/12 0818          Assessment/Plan: s/p Procedure(s): PARTIAL COLECTOMY d/c foley Maintain current diet. OOB with assistance.  LOS: 1 day    Marta Lamas. Gae Bon, MD, FACS [phone] [facility] 03/31/2012

## 2012-04-01 MED ORDER — ACETAMINOPHEN 325 MG PO TABS
650.0000 mg | ORAL_TABLET | Freq: Four times a day (QID) | ORAL | Status: DC | PRN
  Administered 2012-04-01: 650 mg via ORAL
  Filled 2012-04-01: qty 2

## 2012-04-01 NOTE — Progress Notes (Signed)
2 Days Post-Op  Subjective: No flatus, ate some oatmeal this AM  Objective: Vital signs in last 24 hours: Temp:  [97.8 F (36.6 C)-99.5 F (37.5 C)] 97.8 F (36.6 C) (08/17 0601) Pulse Rate:  [83-89] 86  (08/17 0601) Resp:  [15-20] 20  (08/17 0800) BP: (105-124)/(65-68) 115/68 mmHg (08/17 0601) SpO2:  [97 %-100 %] 100 % (08/17 0800) Last BM Date: 03/29/12  Intake/Output from previous day: 08/16 0701 - 08/17 0700 In: 3063.9 [P.O.:720; I.V.:2343.9] Out: 3150 [Urine:3150] Intake/Output this shift:    General appearance: alert and cooperative Resp: clear to auscultation bilaterally Cardio: regular rate and rhythm GI: soft, incision CDI, some BS  Lab Results:   [facility] 03/31/12 0611  WBC 14.9*  HGB 11.2*  HCT 35.3*  PLT 281   BMET  Basename 03/31/12 0611  NA 138  K 3.8  CL 102  CO2 20  GLUCOSE 132*  BUN 9  CREATININE 0.87  CALCIUM 8.2*   PT/INR No results found for this basename: LABPROT:2,INR:2 in the last 72 hours ABG No results found for this basename: PHART:2,PCO2:2,PO2:2,HCO3:2 in the last 72 hours  Studies/Results: No results found.  Anti-infectives: Anti-infectives     Start     Dose/Rate Route Frequency Ordered Stop   03/30/12 1400   cefOXitin (MEFOXIN) 1 g in dextrose 5 % 50 mL IVPB        1 g 100 mL/hr over 30 Minutes Intravenous 4 times per day 03/30/12 1217 03/30/12 1611   03/29/12 1414   cefOXitin (MEFOXIN) 2 g in dextrose 5 % 50 mL IVPB        2 g 100 mL/hr over 30 Minutes Intravenous 60 min pre-op 03/29/12 1414 03/30/12 0818          Assessment/Plan: s/p Procedure(s) (LRB): PARTIAL COLECTOMY (N/A) S/P R Colectomy POD #2 Fulls only D/C Foley Mobilize VTE - Lovenox   LOS: 2 days    Odilia Damico E 04/01/2012

## 2012-04-02 MED ORDER — HYDROMORPHONE HCL PF 1 MG/ML IJ SOLN: 1.0000 mg | INTRAMUSCULAR | Status: DC | PRN

## 2012-04-02 NOTE — Progress Notes (Signed)
Patient ID: Jordan [name], female   DOB: 26-Feb-1951, 61 y.o.   MRN: [id number] 3 Days Post-Op  Subjective: Some flatus this morning, eating full liquid diet, but still with early satiety.  Objective: Vital signs in last 24 hours: Temp:  [98 F (36.7 C)-98.7 F (37.1 C)] 98.4 F (36.9 C) (08/18 0525) Pulse Rate:  [80-89] 89  (08/18 0525) Resp:  [15-20] 20  (08/18 0800) BP: (106-118)/(61-73) 106/73 mmHg (08/18 0525) SpO2:  [95 %-99 %] 98 % (08/18 0800) Last BM Date: 03/30/12  Intake/Output from previous day: 08/17 0701 - 08/18 0700 In: 3190 [P.O.:690; I.V.:2500] Out: 2925 [Urine:2925] Intake/Output this shift:    General appearance: alert and cooperative Resp: clear to auscultation bilaterally Cardio: regular rate and rhythm GI: soft, incision CDI, some BS  Lab Results:   [facility] 03/31/12 0611  WBC 14.9*  HGB 11.2*  HCT 35.3*  PLT 281   BMET  Basename 03/31/12 0611  NA 138  K 3.8  CL 102  CO2 20  GLUCOSE 132*  BUN 9  CREATININE 0.87  CALCIUM 8.2*   PT/INR No results found for this basename: LABPROT:2,INR:2 in the last 72 hours ABG No results found for this basename: PHART:2,PCO2:2,PO2:2,HCO3:2 in the last 72 hours  Studies/Results: No results found.  Anti-infectives: Anti-infectives     Start     Dose/Rate Route Frequency Ordered Stop   03/30/12 1400   cefOXitin (MEFOXIN) 1 g in dextrose 5 % 50 mL IVPB        1 g 100 mL/hr over 30 Minutes Intravenous 4 times per day 03/30/12 1217 03/30/12 1611   03/29/12 1414   cefOXitin (MEFOXIN) 2 g in dextrose 5 % 50 mL IVPB        2 g 100 mL/hr over 30 Minutes Intravenous 60 min pre-op 03/29/12 1414 03/30/12 0818          Assessment/Plan: s/p Procedure(s) (LRB): PARTIAL COLECTOMY (N/A) S/P R Colectomy POD #2 Continue with full liquid diet as tolerated. Mobilize ambulate with assistance Will dc PCA  VTE - Lovenox   LOS: 3 days    Jordan [name] 04/02/2012

## 2012-04-02 NOTE — Progress Notes (Signed)
I have seen and examined the patient and agree with the assessment and plans. Will continue full liquid diet  Jordan [name] A. Magnus Ivan  MD, FACS

## 2012-04-02 NOTE — Evaluation (Signed)
Physical Therapy Evaluation Patient Details Name: Jordan [name] MRN: [id number] DOB: 08/31/1950 Today's Date: 04/02/2012 Time: 7829-5621 PT Time Calculation (min): 17 min  PT Assessment / Plan / Recommendation Clinical Impression  Pt s/p partial colectomy secondary to colon polyp. Pt with decreased functional mobility and pain limiting endurance. Pt will benefit from skilled PT in the acute care setting in order to return to PLOF and safety prior to d/c    PT Assessment  Patient needs continued PT services    Follow Up Recommendations  No PT follow up;Supervision for mobility/OOB    Barriers to Discharge        Equipment Recommendations  Rolling walker with 5" wheels    Recommendations for Other Services     Frequency Min 3X/week    Precautions / Restrictions Precautions Precautions: None Restrictions Weight Bearing Restrictions: No         Mobility  Bed Mobility Bed Mobility: Supine to Sit;Sitting - Scoot to Edge of Bed;Sit to Supine Supine to Sit: 3: Mod assist;With rails;HOB elevated Sitting - Scoot to Edge of Bed: 5: Supervision Sit to Supine: 4: Min assist Details for Bed Mobility Assistance: VC for hand palcement. Pt slow to transfer secondary to increased pain with abdominal strain. Mod assist through trunk into sitting as well as assist with RLE back into supine Transfers Transfers: Sit to Stand;Stand to Sit Sit to Stand: 4: Min assist;With upper extremity assist;From bed Stand to Sit: 4: Min assist;With upper extremity assist;To bed Details for Transfer Assistance: VC for hand placement for a safe transfer to RW. Ambulation/Gait Ambulation/Gait Assistance: 4: Min assist Ambulation Distance (Feet): 30 Feet Assistive device: Rolling walker Ambulation/Gait Assistance Details: VC for proper sequencing and safety with RW. Min assist for stability throughout.  Gait Pattern: Step-to pattern;Trunk flexed;Wide base of support;Decreased hip/knee flexion -  right;Decreased hip/knee flexion - left Gait velocity: decreased gait speed    Exercises     PT Diagnosis: Difficulty walking;Acute pain  PT Problem List: Decreased strength;Decreased activity tolerance;Decreased mobility;Decreased balance;Decreased knowledge of use of DME;Decreased safety awareness;Decreased knowledge of precautions;Pain PT Treatment Interventions: DME instruction;Gait training;Stair training;Functional mobility training;Therapeutic activities;Therapeutic exercise;Patient/family education   PT Goals Acute Rehab PT Goals PT Goal Formulation: With patient Time For Goal Achievement: 04/09/12 Potential to Achieve Goals: Good Pt will go Supine/Side to Sit: with modified independence PT Goal: Supine/Side to Sit - Progress: Goal set today Pt will go Sit to Supine/Side: with modified independence PT Goal: Sit to Supine/Side - Progress: Goal set today Pt will go Sit to Stand: with modified independence PT Goal: Sit to Stand - Progress: Goal set today Pt will go Stand to Sit: with modified independence PT Goal: Stand to Sit - Progress: Goal set today Pt will Transfer Bed to Chair/Chair to Bed: with modified independence PT Transfer Goal: Bed to Chair/Chair to Bed - Progress: Goal set today Pt will Ambulate: >150 feet;with supervision;with least restrictive assistive device PT Goal: Ambulate - Progress: Goal set today Pt will Go Up / Down Stairs: 1-2 stairs;with supervision;with rail(s) PT Goal: Up/Down Stairs - Progress: Goal set today  Visit Information  Last PT Received On: 04/02/12 Assistance Needed: +1    Subjective Data  Patient Stated Goal: to go home   Prior Functioning  Home Living Lives With: Spouse Available Help at Discharge: Family;Available 24 hours/day Type of Home: House Home Access: Stairs to enter [employer] of Steps: 1 Entrance Stairs-Rails: Can reach both Home Layout: One level Bathroom Shower/Tub: Tub/shower unit;Curtain Bathroom  Toilet: Standard Bathroom Accessibility: Yes How Accessible: Accessible via walker Home Adaptive Equipment: Crutches Prior Function Level of Independence: Independent Able to Take Stairs?: Yes Driving: Yes Vocation: Retired Musician: No difficulties Dominant Hand: Right    Cognition  Overall Cognitive Status: Appears within functional limits for tasks assessed/performed Arousal/Alertness: Awake/alert Orientation Level: Appears intact for tasks assessed Behavior During Session: [facility] for tasks performed    Extremity/Trunk Assessment Right Lower Extremity Assessment RLE ROM/Strength/Tone: Unable to fully assess;Due to pain RLE Sensation: WFL - Light Touch Left Lower Extremity Assessment LLE ROM/Strength/Tone: Within functional levels LLE Sensation: WFL - Light Touch   Balance    End of Session PT - End of Session Equipment Utilized During Treatment: Gait belt Activity Tolerance: Patient limited by pain;Patient limited by fatigue Patient left: in bed;with call bell/phone within reach;with family/visitor present Nurse Communication: Mobility status    Milana Kidney 04/02/2012, 4:34 PM  04/02/2012 Milana Kidney DPT PAGER: [phone] OFFICE: [phone]

## 2012-04-03 MED ORDER — HYDROMORPHONE HCL PF 1 MG/ML IJ SOLN: 1.0000 mg | INTRAMUSCULAR | Status: DC | PRN

## 2012-04-03 MED ORDER — OXYCODONE-ACETAMINOPHEN 5-325 MG PO TABS: 1.0000 | ORAL_TABLET | ORAL | Status: AC | PRN

## 2012-04-03 MED ORDER — SODIUM CHLORIDE 0.9 % IJ SOLN: 3.0000 mL | INTRAMUSCULAR | Status: DC | PRN

## 2012-04-03 NOTE — Progress Notes (Signed)
Patient discharged to home in care of family. Medications and instructions reviewed with patient and family with no questions. IV d/c'd with cath intact. Assessment unchanged from this am. Patient is to follow up with Dr. Lindie Spruce in 2 weeks.

## 2012-04-03 NOTE — Progress Notes (Signed)
GS Progress Note Subjective: Patient is doing quite well.  Has had several bowel movements.  Passing flatus also.  Objective: Vital signs in last 24 hours: Temp:  [98.1 F (36.7 C)-98.8 F (37.1 C)] 98.1 F (36.7 C) (08/19 0557) Pulse Rate:  [79-91] 79  (08/19 0557) Resp:  [18-20] 18  (08/19 0557) BP: (108-125)/(67-77) 115/67 mmHg (08/19 0557) SpO2:  [97 %-100 %] 97 % (08/19 0557) Last BM Date: 04/02/12  Intake/Output from previous day: 08/18 0701 - 08/19 0700 In: 1931 [P.O.:400; I.V.:1531] Out: 500 [Urine:500] Intake/Output this shift:    Lungs: Clear  Abd: Soft, not distended, good bowel sounds.  Extremities: No DVT signs or symptoms  Neuro: Intact  Lab Results: CBC  No results found for this basename: WBC:2,HGB:2,HCT:2,PLT:2 in the last 72 hours BMET No results found for this basename: NA:2,K:2,CL:2,CO2:2,GLUCOSE:2,BUN:2,CREATININE:2,CALCIUM:2 in the last 72 hours PT/INR No results found for this basename: LABPROT:2,INR:2 in the last 72 hours ABG No results found for this basename: PHART:2,PCO2:2,PO2:2,HCO3:2 in the last 72 hours  Studies/Results: No results found.  Anti-infectives: Anti-infectives     Start     Dose/Rate Route Frequency Ordered Stop   03/30/12 1400   cefOXitin (MEFOXIN) 1 g in dextrose 5 % 50 mL IVPB        1 g 100 mL/hr over 30 Minutes Intravenous 4 times per day 03/30/12 1217 03/30/12 1611   03/29/12 1414   cefOXitin (MEFOXIN) 2 g in dextrose 5 % 50 mL IVPB        2 g 100 mL/hr over 30 Minutes Intravenous 60 min pre-op 03/29/12 1414 03/30/12 0818          Assessment/Plan: s/p Procedure(s): PARTIAL COLECTOMY Advance diet Discharge Check patient after soft diet this AM and discharge this afternoon if does well.  Pathology has not returned yet. Saline lock IV  LOS: 4 days    Marta Lamas. Gae Bon, MD, FACS [phone] [facility] 04/03/2012

## 2012-04-03 NOTE — Progress Notes (Signed)
UR completed 

## 2012-04-03 NOTE — Discharge Summary (Signed)
Physician Discharge Summary  Patient ID: Jordan [name] MRN: [id number] DOB/AGE: October 06, 1950 61 y.o.  Admit date: 03/30/2012 Discharge date: 04/03/2012  Admission Diagnoses:  Discharge Diagnoses:  Pathology:  Tubulovillous adenoma with high grade dysplasia, negative margins.  Discharged Condition: good  Hospital Course: The patient was admitted the day of her surgery for a right colectomy for sessile polyp of the right colon.  She underwent the operation uneventfully and the tumor was identified.  Postoperatively she had minimal problems.  Started passing flatus on POD #2 and had bowel movement on POD #3.  After advancing to soft diet for breakfast and lunch on POD#4 she was discharged to home.  Her wound looked good without infection.  Good bowel sound, minimal tenderness.  Consults: None  Significant Diagnostic Studies: labs: cbc/bmet/pathology  Treatments: IV hydration, antibiotics: cefoxitin, analgesia: Percocet and surgery: colectomy  Discharge Exam: Blood pressure 115/67, pulse 79, temperature 98.1 F (36.7 C), temperature source Oral, resp. rate 18, height 5\' 1"  (1.549 m), weight 102.513 kg (226 lb), SpO2 97.00%. General appearance: alert, cooperative and no distress Resp: clear to auscultation bilaterally GI: soft, non-tender; bowel sounds normal; no masses,  no organomegaly and incision is clean and dry without infection or drainage.  Plastic dressing intact. Extremities: extremities normal, atraumatic, no cyanosis or edema. No DVT signs or symptoms.  Disposition: 01-Home or Self Care  Discharge Orders    Future Appointments: Provider: Department: Dept Phone: Center:   06/20/2012 11:00 AM Fernande Bras, PA-C [facility] [phone] [facility]     Medication List  As of 04/03/2012  8:01 AM   ASK your doctor about these medications         acetaminophen 325 MG tablet   Commonly known as: TYLENOL   Take 650 mg by mouth every 6 (six) hours as needed. For pain      aluminum-magnesium hydroxide-simethicone 200-200-20 MG/5ML Susp   Commonly known as: MAALOX   Take 30 mLs by mouth as needed.      aspirin 81 MG tablet   Take 81 mg by mouth daily.      buPROPion 150 MG 24 hr tablet   Commonly known as: WELLBUTRIN XL   Take 150 mg by mouth daily.      erythromycin base 500 MG tablet   Commonly known as: E-MYCIN   Take 500 mg by mouth 4 (four) times daily.      ibuprofen 200 MG tablet   Commonly known as: ADVIL,MOTRIN   Take 400 mg by mouth every 6 (six) hours as needed. For pain      levothyroxine 125 MCG tablet   Commonly known as: SYNTHROID, LEVOTHROID   TAKE 1 TABLET BY MOUTH DAILY      metoprolol succinate 25 MG 24 hr tablet   Commonly known as: TOPROL-XL   Take 1 tablet (25 mg total) by mouth daily.      neomycin 500 MG tablet   Commonly known as: MYCIFRADIN   Take 1,000 mg by mouth 3 (three) times daily.      nitroGLYCERIN 0.4 MG SL tablet   Commonly known as: NITROSTAT   Place 0.4 mg under the tongue every 5 (five) minutes as needed.      pantoprazole 40 MG tablet   Commonly known as: PROTONIX   Take 40 mg by mouth daily as needed. For severe acid reflux      simvastatin 40 MG tablet   Commonly known as: ZOCOR   TAKE 1 TABLET BY  MOUTH EVERY EVENING      SINUS MEDICINE PO   Take 1 tablet by mouth 2 (two) times daily.      triamterene-hydrochlorothiazide 37.5-25 MG per tablet   Commonly known as: MAXZIDE-25   TAKE 1 TABLET BY MOUTH DAILY      ZETIA 10 MG tablet   Generic drug: ezetimibe   TAKE 1 TABLET BY MOUTH DAILY           Follow-up Information    Follow up with Josee Speece, Marta Lamas, MD in 2 weeks.   Contact information:   [address] [facility], [place] [address] [phone]          Signed: Cherylynn Ridges 04/03/2012, 8:01 AM

## 2012-04-03 NOTE — Progress Notes (Signed)
Physical Therapy Treatment Patient Details Name: Jordan [name] MRN: [id number] DOB: 11/27/1950 Today's Date: 04/03/2012 Time: 9629-5284 PT Time Calculation (min): 19 min  PT Assessment / Plan / Recommendation Comments on Treatment Session  Pt improved ambulation distance and declined stair practice, stating that she would "be fine." Pt reports she will discharge this afternoon so pt was educated about making sure house was safe for use of RW (picking up cords/rugs, proper lighting, etc.). Pt verbalized understanding.    Follow Up Recommendations  No PT follow up;Supervision for mobility/OOB    Barriers to Discharge        Equipment Recommendations  Rolling walker with 5" wheels    Recommendations for Other Services    Frequency Min 3X/week   Plan Discharge plan remains appropriate;Frequency remains appropriate    Precautions / Restrictions Precautions Precautions: None Restrictions Weight Bearing Restrictions: No       Mobility  Bed Mobility Bed Mobility: Supine to Sit;Sitting - Scoot to Edge of Bed;Sit to Supine Supine to Sit: HOB elevated;With rails;5: Supervision Sitting - Scoot to Edge of Bed: 5: Supervision;With rail Sit to Supine: 5: Supervision;With rail;HOB elevated Details for Bed Mobility Assistance: Pt required verbal cueing for hand placement during bed mobility and supervision for safety due to decreased mobility secondary to pain. Transfers Transfers: Sit to Stand;Stand to Sit Sit to Stand: 4: Min guard;With upper extremity assist;From bed Stand to Sit: 4: Min guard;With upper extremity assist;To bed Details for Transfer Assistance: Pt requried verbal cueing for sequencing and hand placement with transfers and min guard for safety. Ambulation/Gait Ambulation/Gait Assistance: 4: Min guard Ambulation Distance (Feet): 150 Feet Assistive device: Rolling walker Ambulation/Gait Assistance Details: Pt required verbal cueing for safe use of RW and upright posture  during ambulation. Min guard for safety due to pt's pain. Gait Pattern: Step-through pattern;Wide base of support;Decreased stride length;Trunk flexed Stairs: No      PT Goals Acute Rehab PT Goals PT Goal: Sit to Stand - Progress: Progressing toward goal PT Goal: Stand to Sit - Progress: Progressing toward goal PT Goal: Ambulate - Progress: Progressing toward goal  Visit Information  Last PT Received On: 04/03/12 Assistance Needed: +1    Subjective Data  Subjective: "I just got back in bed."   Cognition  Overall Cognitive Status: Appears within functional limits for tasks assessed/performed Arousal/Alertness: Awake/alert Orientation Level: Appears intact for tasks assessed Behavior During Session: [facility] for tasks performed    Balance  Balance Balance Assessed: No  End of Session PT - End of Session Equipment Utilized During Treatment: Gait belt Activity Tolerance: Patient tolerated treatment well;Patient limited by pain Patient left: in bed;with call bell/phone within reach;with family/visitor present Nurse Communication: Mobility status   GP     Delvon Chipps 04/03/2012, 3:51 PM

## 2012-04-03 NOTE — Progress Notes (Signed)
I have read and agree with the below treatment note and plan. Enora Trillo Helen Whitlow PT, DPT Pager: [phone] 

## 2012-04-04 NOTE — Care Management Note (Signed)
    Page 1 of 1   04/04/2012     8:10:38 AM   CARE MANAGEMENT NOTE 04/04/2012  Patient:  Jordan [name], Jordan [name]   Account Number:  [id number]  Date Initiated:  03/30/2012  Documentation initiated by:  Ronny Flurry  Subjective/Objective Assessment:   PARTIAL COLECTOMY     Action/Plan:   Anticipated DC Date:  04/06/2012   Anticipated DC Plan:  HOME/SELF CARE      DC Planning Services  CM consult      Choice offered to / List presented to:             Status of service:  Completed, signed off Medicare Important Message given?   (If response is "NO", the following Medicare IM given date fields will be blank) Date Medicare IM given:   Date Additional Medicare IM given:    Discharge Disposition:  HOME/SELF CARE  Per UR Regulation:  Reviewed for med. necessity/level of care/duration of stay  If discussed at Long Length of Stay Meetings, dates discussed:    Comments:  04/03/2012 Carlyle Lipa, RN BSN [facility] CCM [id number]--Pt remains on acute unit. Diet advancing. PCA still in place.

## 2012-04-18 ENCOUNTER — Encounter (INDEPENDENT_AMBULATORY_CARE_PROVIDER_SITE_OTHER): Payer: Self-pay | Admitting: General Surgery

## 2012-04-18 ENCOUNTER — Ambulatory Visit (INDEPENDENT_AMBULATORY_CARE_PROVIDER_SITE_OTHER): Payer: BC Managed Care – PPO | Admitting: General Surgery

## 2012-04-18 VITALS — BP 136/82 | HR 74 | Temp 97.7°F | Resp 18 | Ht 61.0 in | Wt 218.0 lb

## 2012-04-18 DIAGNOSIS — Z09 Encounter for follow-up examination after completed treatment for conditions other than malignant neoplasm: Secondary | ICD-10-CM

## 2012-04-18 NOTE — Progress Notes (Signed)
The patient is doing well status post right colectomy. Her pathology demonstrated a tubulovillous adenoma without any evidence of cancer.  On examination today the patient is doing well however she see very little. Her wound is healing well but there is some hematoma medially. A loculated wound began in approximately 4 weeks to make sure she continues to heal well. I removed the dressing and there is no evidence of infection.  I advised the patient to eat smaller more frequent meals to help supplement her diet. She otherwise is doing well. I have allowed her to go ahead and drive a car and start exercise for still no heavy lifting for total of 6 weeks after surgery.

## 2012-04-25 ENCOUNTER — Encounter (INDEPENDENT_AMBULATORY_CARE_PROVIDER_SITE_OTHER): Payer: BC Managed Care – PPO | Admitting: Surgery

## 2012-05-04 ENCOUNTER — Other Ambulatory Visit: Payer: Self-pay | Admitting: Physician Assistant

## 2012-05-16 ENCOUNTER — Encounter (INDEPENDENT_AMBULATORY_CARE_PROVIDER_SITE_OTHER): Payer: Self-pay | Admitting: General Surgery

## 2012-05-16 ENCOUNTER — Ambulatory Visit (INDEPENDENT_AMBULATORY_CARE_PROVIDER_SITE_OTHER): Payer: BC Managed Care – PPO | Admitting: General Surgery

## 2012-05-16 VITALS — BP 136/80 | HR 76 | Temp 98.6°F | Resp 18 | Ht 61.0 in | Wt 220.4 lb

## 2012-05-16 DIAGNOSIS — Z09 Encounter for follow-up examination after completed treatment for conditions other than malignant neoplasm: Secondary | ICD-10-CM

## 2012-05-16 NOTE — Progress Notes (Signed)
The patient is doing well status post right colectomy for a tubulovillous adenoma. She is eating fairly well although still does not have a great appetite for food. She is having 1-2 normal bowel movements per day.  On examination today her vital signs are stable. She is afebrile. She has minimal tenderness around the incision in the right side. There is no palpable mass and no discernible hernia.  Impression is that she is doing well. She is return to see me on a when necessary basis. She will likely need a colonoscopy within a year.

## 2012-06-08 ENCOUNTER — Other Ambulatory Visit: Payer: Self-pay | Admitting: *Deleted

## 2012-06-18 ENCOUNTER — Other Ambulatory Visit: Payer: Self-pay | Admitting: Physician Assistant

## 2012-06-20 ENCOUNTER — Ambulatory Visit: Payer: BC Managed Care – PPO | Admitting: Physician Assistant

## 2012-06-20 ENCOUNTER — Encounter: Payer: Self-pay | Admitting: Physician Assistant

## 2012-06-20 ENCOUNTER — Ambulatory Visit (INDEPENDENT_AMBULATORY_CARE_PROVIDER_SITE_OTHER): Payer: BC Managed Care – PPO | Admitting: Physician Assistant

## 2012-06-20 VITALS — BP 108/78 | HR 66 | Temp 98.4°F | Resp 16 | Ht 61.0 in | Wt 219.6 lb

## 2012-06-20 DIAGNOSIS — M79646 Pain in unspecified finger(s): Secondary | ICD-10-CM

## 2012-06-20 DIAGNOSIS — F32A Depression, unspecified: Secondary | ICD-10-CM

## 2012-06-20 DIAGNOSIS — R109 Unspecified abdominal pain: Secondary | ICD-10-CM

## 2012-06-20 DIAGNOSIS — E785 Hyperlipidemia, unspecified: Secondary | ICD-10-CM

## 2012-06-20 DIAGNOSIS — Z1239 Encounter for other screening for malignant neoplasm of breast: Secondary | ICD-10-CM

## 2012-06-20 DIAGNOSIS — Z1231 Encounter for screening mammogram for malignant neoplasm of breast: Secondary | ICD-10-CM

## 2012-06-20 DIAGNOSIS — E559 Vitamin D deficiency, unspecified: Secondary | ICD-10-CM

## 2012-06-20 DIAGNOSIS — C189 Malignant neoplasm of colon, unspecified: Secondary | ICD-10-CM

## 2012-06-20 DIAGNOSIS — M79609 Pain in unspecified limb: Secondary | ICD-10-CM

## 2012-06-20 DIAGNOSIS — Z23 Encounter for immunization: Secondary | ICD-10-CM

## 2012-06-20 DIAGNOSIS — I10 Essential (primary) hypertension: Secondary | ICD-10-CM

## 2012-06-20 DIAGNOSIS — E039 Hypothyroidism, unspecified: Secondary | ICD-10-CM

## 2012-06-20 DIAGNOSIS — F329 Major depressive disorder, single episode, unspecified: Secondary | ICD-10-CM

## 2012-06-20 MED ORDER — METOPROLOL SUCCINATE ER 25 MG PO TB24: 25.0000 mg | ORAL_TABLET | Freq: Every day | ORAL | Status: DC

## 2012-06-20 MED ORDER — BUPROPION HCL ER (XL) 150 MG PO TB24: 150.0000 mg | ORAL_TABLET | ORAL | Status: DC

## 2012-06-20 MED ORDER — SIMVASTATIN 40 MG PO TABS: 40.0000 mg | ORAL_TABLET | Freq: Every day | ORAL | Status: DC

## 2012-06-20 MED ORDER — MELOXICAM 15 MG PO TABS
15.0000 mg | ORAL_TABLET | Freq: Every day | ORAL | Status: DC
Start: 2012-06-20 — End: 2012-11-23

## 2012-06-20 MED ORDER — EZETIMIBE 10 MG PO TABS: 10.0000 mg | ORAL_TABLET | Freq: Every day | ORAL | Status: DC

## 2012-06-20 MED ORDER — TRIAMTERENE-HCTZ 37.5-25 MG PO TABS: 1.0000 | ORAL_TABLET | Freq: Every day | ORAL | Status: DC

## 2012-06-20 MED ORDER — LEVOTHYROXINE SODIUM 125 MCG PO TABS: 125.0000 ug | ORAL_TABLET | Freq: Every day | ORAL | Status: DC

## 2012-06-20 NOTE — Patient Instructions (Addendum)
Please begin walking for exercise.  However, if that makes the pain at your surgical site worse, stop until you see Dr. Lindie Spruce.

## 2012-06-20 NOTE — Progress Notes (Signed)
Subjective:    Patient ID: Jordan [name], female    DOB: 07/13/1951, 61 y.o.   MRN: [id number]  HPI This 61 y.o. female presents for evaluation of hypothyroidism, HTN, hyperlipidemia and depression.  She finally had a colonoscopy, which revealed 2 polyps, one of which was cancerous.  She underwent a right colectomy in August.  She's recovered well, but hasn't gotten back into exercise due to pain at her surgical site.  She describes a "pulling" pain with activity and has taken to wearing a stronger girdle when she leaves the house.  Housework increases her pain, as does wearing high heels and getting out of bed in the morning.  No bulging at, or surrounding, the surgical site.  Pain resolves with rest.  Review of Systems No chest pain, SOB, HA, dizziness, vision change, N/V, diarrhea, constipation, dysuria, urinary urgency or frequency, or rash.  She has noted several weeks of pain and swelling of the RIGHT middle finger, which she thinks is arthritis.  Ibuprofen helps a little.  Past Medical History  Diagnosis Date  . Hyperlipidemia     takes Zetia and Zocor daily  . Thyroid disease   . Leg swelling   . Constipation   . Generalized headaches     due to allergies, sinus  . PONV (postoperative nausea and vomiting)     pt states she is very easy to sedate  . Hypertension     takes Maxzide and Metoprolol daily  . Hx of seasonal allergies     takes OTC allergy med nightly  . History of migraine   . Vertigo     takes Meclizine prn  . Joint pain   . Back pain     buldging disc  . GERD (gastroesophageal reflux disease)     takes Protonix as needed  . Hemorrhoids   . History of colon polyps   . Mass of colon   . Cancer     colon  . Hypothyroidism     takes Synthroid daily  . Depression     takes Wellbutrin daily    Past Surgical History  Procedure Date  . Cesarean section 1968, 1970  . Knee surgery 1998    right - arthroscopic  . Abdominal hysterectomy   . Exploratory  laparotomy   . Carpal tunnel release   . Colonoscopy   . Esophagogastroduodenoscopy   . Partial colectomy 03/30/2012    Procedure: PARTIAL COLECTOMY;  Surgeon: Cherylynn Ridges, MD;  Location: [facility] OR;  Service: General;  Laterality: N/A;    Prior to Admission medications   Medication Sig Start Date End Date Taking? Authorizing Provider  aspirin 81 MG tablet Take 81 mg by mouth daily.   Yes Historical Provider, MD  buPROPion (WELLBUTRIN XL) 150 MG 24 hr tablet Take 150 mg by mouth daily.   Yes Historical Provider, MD  buPROPion (WELLBUTRIN XL) 150 MG 24 hr tablet Take 1 tablet (150 mg total) by mouth every morning. 06/20/12  Yes Otniel Hoe S Derry Arbogast, PA-C  ezetimibe (ZETIA) 10 MG tablet Take 1 tablet (10 mg total) by mouth daily. 06/20/12  Yes Jai Steil S Merial Moritz, PA-C  levothyroxine (SYNTHROID, LEVOTHROID) 125 MCG tablet Take 1 tablet (125 mcg total) by mouth daily. 06/20/12  Yes Shauntae Reitman S Chidera Dearcos, PA-C  metoprolol succinate (TOPROL-XL) 25 MG 24 hr tablet Take 1 tablet (25 mg total) by mouth daily. 06/20/12  Yes Nereida Schepp S Jakhia Buxton, PA-C  simvastatin (ZOCOR) 40 MG tablet Take 1 tablet (40 mg total) by  mouth at bedtime. 06/20/12  Yes Dailyn Reith S Keyara Ent, PA-C  triamterene-hydrochlorothiazide (MAXZIDE-25) 37.5-25 MG per tablet Take 1 each (1 tablet total) by mouth daily. 06/20/12  Yes Marinna Blane S Amberia Bayless, PA-C  aluminum-magnesium hydroxide-simethicone (MAALOX) 200-200-20 MG/5ML SUSP Take 30 mLs by mouth as needed.    Historical Provider, MD  meloxicam (MOBIC) 15 MG tablet Take 1 tablet (15 mg total) by mouth daily. 06/20/12   Kiamesha Samet S Adron Geisel, PA-C  nitroGLYCERIN (NITROSTAT) 0.4 MG SL tablet Place 0.4 mg under the tongue every 5 (five) minutes as needed.    Historical Provider, MD  oxyCODONE-acetaminophen (PERCOCET/ROXICET) 5-325 MG per tablet  04/03/12   Historical Provider, MD    No Known Allergies  History   Social History  . Marital Status: Married    Spouse Name: Deniece Portela    Number of Children: 1  . Years of  Education: 16   Occupational History  . retired Runner, broadcasting/film/video    Social History Main Topics  . Smoking status: Never Smoker   . Smokeless tobacco: Never Used  . Alcohol Use: No  . Drug Use: No  . Sexually Active: Yes -- Female partner(s)    Birth Control/ Protection: Surgical   Other Topics Concern  . Not on file   Social History Narrative   Lives with her husband and her granddaughter, who she has raised as her own.  Marriage has been unhappy for a long time, and planned to leave when her granddaughter left for college.  She's currently at [employer] in [place], but is unhappy and wants to move home.    Family History  Problem Relation Age of Onset  . Cancer Mother     uterine  . Cancer Father     prostate  . Cancer Brother     colon  . Cancer Maternal Aunt     breast  . Cancer Maternal Grandmother     breast  . Cancer Paternal Grandmother     colon  . Cancer Brother     prostate  . Cancer Brother     prostate  . Cancer Brother     prostate       Objective:   Physical Exam  Blood pressure 108/78, pulse 66, temperature 98.4 F (36.9 C), temperature source Oral, resp. rate 16, height 5\' 1"  (1.549 m), weight 219 lb 9.6 oz (99.61 kg), SpO2 100.00%. Body mass index is 41.49 kg/(m^2). Well-developed, well nourished, obese BF who is awake, alert and oriented, in NAD. HEENT: [place]/AT, sclera and conjunctiva are clear.  EAC are patent, TMs are normal in appearance. Nasal mucosa is pink and moist. OP is clear. Neck: supple, non-tender, no lymphadenopathy, thyromegaly. Heart: RRR, no murmur Lungs: normal effort, CTA Abdomen: normo-active bowel sounds, supple, no mass or organomegaly. Tenderness of the well-healed right abdominal surgical scar, worst about 2 inches from the medial pole.  No palpable hernia defect. Extremities: no cyanosis, clubbing. Swelling noted on the RIGHT middle finger, with mild swelling, worst at the PIP.  Decreased flexion of the PIP due to  pain. Skin: warm and dry without rash. Psychologic: good mood and appropriate affect, normal speech and behavior.     Assessment & Plan:   1. HTN (hypertension)-controlled  triamterene-hydrochlorothiazide (MAXZIDE-25) 37.5-25 MG per tablet, metoprolol succinate (TOPROL-XL) 25 MG 24 hr tablet, Comprehensive metabolic panel  2. Unspecified hypothyroidism  levothyroxine (SYNTHROID, LEVOTHROID) 125 MCG tablet, TSH  3. Depression, stable  buPROPion (WELLBUTRIN XL) 150 MG 24 hr tablet  4. Hyperlipidemia  ezetimibe (  ZETIA) 10 MG tablet, simvastatin (ZOCOR) 40 MG tablet, Comprehensive metabolic panel, Lipid panel  5. Vitamin D deficiency    6. Need for prophylactic vaccination and inoculation against influenza  Flu vaccine greater than or equal to 3yo preservative free IM  7. Colon cancer    8. Finger pain  meloxicam (MOBIC) 15 MG tablet, D?C OTC ibuprofen. Declines X-ray today.  9. Abdominal wall pain  Ambulatory referral to General Surgery Megan Mans); if no hernia, will refer to PT for core strengthening  10. Screening for breast cancer  MM Digital Screening

## 2012-06-21 ENCOUNTER — Encounter: Payer: Self-pay | Admitting: Physician Assistant

## 2012-06-21 LAB — LIPID PANEL
Cholesterol: 178 mg/dL (ref 0–200)
Triglycerides: 98 mg/dL (ref <150)

## 2012-06-21 LAB — COMPREHENSIVE METABOLIC PANEL
ALT: 20 U/L (ref 0–35)
Albumin: 3.9 g/dL (ref 3.5–5.2)
CO2: 25 mEq/L (ref 19–32)
Chloride: 107 mEq/L (ref 96–112)
Glucose, Bld: 89 mg/dL (ref 70–99)
Potassium: 3.8 mEq/L (ref 3.5–5.3)
Sodium: 143 mEq/L (ref 135–145)
Total Protein: 6.8 g/dL (ref 6.0–8.3)

## 2012-07-18 ENCOUNTER — Ambulatory Visit
Admission: RE | Admit: 2012-07-18 | Discharge: 2012-07-18 | Disposition: A | Discharge status: Home / Self Care | Payer: BC Managed Care – PPO | Source: Ambulatory Visit | Attending: General Surgery | Admitting: General Surgery

## 2012-07-18 ENCOUNTER — Ambulatory Visit (INDEPENDENT_AMBULATORY_CARE_PROVIDER_SITE_OTHER): Payer: BC Managed Care – PPO | Admitting: General Surgery

## 2012-07-18 ENCOUNTER — Encounter (INDEPENDENT_AMBULATORY_CARE_PROVIDER_SITE_OTHER): Payer: Self-pay | Admitting: General Surgery

## 2012-07-18 VITALS — BP 114/72 | HR 71 | Temp 97.6°F | Resp 16 | Ht 61.0 in | Wt 221.6 lb

## 2012-07-18 DIAGNOSIS — R109 Unspecified abdominal pain: Secondary | ICD-10-CM

## 2012-07-18 NOTE — Progress Notes (Signed)
The patient comes in for reevaluation of abdominal pain at the lateral and superior aspect of her right transverse abdominal scar. On examination today I do not palpate a hernia although she does seem slightly more swollen on the right side it is not very obvious if she should have hernia.  In order to delineate if there is a defect on that side I do think a CT scan of the abdomen and pelvis would be needed. Oral contrast only would be necessary. We will go ahead and schedule that. I will call the patient with the results we would not require her to return to see me for the results to be given.

## 2012-07-26 ENCOUNTER — Telehealth (INDEPENDENT_AMBULATORY_CARE_PROVIDER_SITE_OTHER): Payer: Self-pay

## 2012-07-26 NOTE — Telephone Encounter (Signed)
The pt called requesting ct results.  I notified her that per Dr Lindie Spruce there is no evidence of hernia and it showed some inflammation.  She asked what to do for the inflammation.  I told her he did not recommend antibiotics or treatment at this time.  There is no need for follow up.

## 2012-08-01 ENCOUNTER — Ambulatory Visit
Admission: RE | Admit: 2012-08-01 | Discharge: 2012-08-01 | Disposition: A | Discharge status: Home / Self Care | Payer: BC Managed Care – PPO | Source: Ambulatory Visit | Attending: Physician Assistant | Admitting: Physician Assistant

## 2012-08-01 DIAGNOSIS — Z1231 Encounter for screening mammogram for malignant neoplasm of breast: Secondary | ICD-10-CM

## 2012-11-23 ENCOUNTER — Ambulatory Visit (INDEPENDENT_AMBULATORY_CARE_PROVIDER_SITE_OTHER): Payer: BC Managed Care – PPO | Admitting: Physician Assistant

## 2012-11-23 ENCOUNTER — Encounter: Payer: Self-pay | Admitting: Physician Assistant

## 2012-11-23 VITALS — BP 105/68 | HR 68 | Temp 97.7°F | Resp 16 | Ht 62.5 in | Wt 225.0 lb

## 2012-11-23 DIAGNOSIS — F329 Major depressive disorder, single episode, unspecified: Secondary | ICD-10-CM

## 2012-11-23 DIAGNOSIS — E559 Vitamin D deficiency, unspecified: Secondary | ICD-10-CM

## 2012-11-23 DIAGNOSIS — I10 Essential (primary) hypertension: Secondary | ICD-10-CM

## 2012-11-23 DIAGNOSIS — J309 Allergic rhinitis, unspecified: Secondary | ICD-10-CM

## 2012-11-23 DIAGNOSIS — F3289 Other specified depressive episodes: Secondary | ICD-10-CM

## 2012-11-23 DIAGNOSIS — Z1159 Encounter for screening for other viral diseases: Secondary | ICD-10-CM

## 2012-11-23 DIAGNOSIS — F32A Depression, unspecified: Secondary | ICD-10-CM

## 2012-11-23 DIAGNOSIS — E785 Hyperlipidemia, unspecified: Secondary | ICD-10-CM

## 2012-11-23 DIAGNOSIS — E039 Hypothyroidism, unspecified: Secondary | ICD-10-CM

## 2012-11-23 MED ORDER — METOPROLOL SUCCINATE ER 25 MG PO TB24: 25.0000 mg | ORAL_TABLET | Freq: Every day | ORAL | Status: DC

## 2012-11-23 MED ORDER — NITROGLYCERIN 0.4 MG SL SUBL: 0.4000 mg | SUBLINGUAL_TABLET | SUBLINGUAL | Status: DC | PRN

## 2012-11-23 MED ORDER — TRIAMTERENE-HCTZ 37.5-25 MG PO TABS: 1.0000 | ORAL_TABLET | Freq: Every day | ORAL | Status: DC

## 2012-11-23 MED ORDER — BUPROPION HCL ER (XL) 150 MG PO TB24: 150.0000 mg | ORAL_TABLET | ORAL | Status: DC

## 2012-11-23 MED ORDER — SIMVASTATIN 40 MG PO TABS: 40.0000 mg | ORAL_TABLET | Freq: Every day | ORAL | Status: DC

## 2012-11-23 MED ORDER — LEVOTHYROXINE SODIUM 125 MCG PO TABS: 125.0000 ug | ORAL_TABLET | Freq: Every day | ORAL | Status: DC

## 2012-11-23 MED ORDER — EZETIMIBE 10 MG PO TABS: 10.0000 mg | ORAL_TABLET | Freq: Every day | ORAL | Status: DC

## 2012-11-23 NOTE — Progress Notes (Signed)
Subjective:    Patient ID: Jordan [name], female    DOB: 04/10/1951, 62 y.o.   MRN: [id number]  HPI This 62 y.o. female presents for evaluation of her chronic medical problems.  After her last visit, she saw her surgeon regarding some pain at her surgical incision.  Hernia was ruled out, and the cause determined to be scar tissue.  She's working on getting outside and improving her core strength, which is helping.  Patient Active Problem List  Diagnosis  . HTN (hypertension)  . Unspecified hypothyroidism  . Depression  . Hyperlipidemia  . Lung nodules  . Vertigo  . Vitamin D deficiency  . Colon cancer    Past Medical History  Diagnosis Date  . Hyperlipidemia     takes Zetia and Zocor daily  . Thyroid disease   . Nasal congestion   . Leg swelling   . Constipation   . Nausea   . Generalized headaches     due to allergies, sinus  . PONV (postoperative nausea and vomiting)     pt states she is very easy to sedate  . Hypertension     takes Maxzide and Metoprolol daily  . Pneumonia     walking about 6-48yrs ago  . History of bronchitis     last time about 6-16yrs ago  . Hx of seasonal allergies     takes OTC allergy med nightly  . History of migraine     last one about 15+yrs ago  . Dizziness     r/t side effects from meds  . Vertigo     takes Meclizine prn  . Joint pain   . Back pain     buldging disc  . GERD (gastroesophageal reflux disease)     takes Protonix as needed  . Hemorrhoids   . History of colon polyps   . Mass of colon   . Cancer     colon  . History of UTI   . Hypothyroidism     takes Synthroid daily  . Depression     takes Wellbutrin daily    Past Surgical History  Procedure Laterality Date  . Cesarean section  1968, 1970  . Knee surgery  1998    right - arthroscopic  . Abdominal hysterectomy    . Exploratory laparotomy    . Carpal tunnel release    . Colonoscopy    . Esophagogastroduodenoscopy    . Partial colectomy  03/30/2012   Procedure: PARTIAL COLECTOMY;  Surgeon: Cherylynn Ridges, MD;  Location: [facility] OR;  Service: General;  Laterality: N/A;    Prior to Admission medications   Medication Sig Start Date End Date Taking? Authorizing Provider  aspirin 81 MG tablet Take 81 mg by mouth daily.   Yes Historical Provider, MD  buPROPion (WELLBUTRIN XL) 150 MG 24 hr tablet Take 1 tablet (150 mg total) by mouth every morning. 06/20/12  Yes Brittany Amirault S Latise Dilley, PA-C  cholecalciferol (VITAMIN D) 1000 UNITS tablet Take 1,000 Units by mouth daily.   Yes Historical Provider, MD  ezetimibe (ZETIA) 10 MG tablet Take 1 tablet (10 mg total) by mouth daily. 06/20/12  Yes Nataly Pacifico S Shauntee Karp, PA-C  levothyroxine (SYNTHROID, LEVOTHROID) 125 MCG tablet Take 1 tablet (125 mcg total) by mouth daily. 06/20/12  Yes Amaria Mundorf S Gunter Conde, PA-C  metoprolol succinate (TOPROL-XL) 25 MG 24 hr tablet Take 1 tablet (25 mg total) by mouth daily. 06/20/12  Yes Zyad Boomer S Westlee Devita, PA-C  simvastatin (ZOCOR) 40 MG tablet Take  1 tablet (40 mg total) by mouth at bedtime. 06/20/12  Yes Barbette Mcglaun S Karielle Davidow, PA-C  triamterene-hydrochlorothiazide (MAXZIDE-25) 37.5-25 MG per tablet Take 1 each (1 tablet total) by mouth daily. 06/20/12  Yes Eulan Heyward S Lugene Hitt, PA-C  nitroGLYCERIN (NITROSTAT) 0.4 MG SL tablet Place 0.4 mg under the tongue every 5 (five) minutes as needed.    Historical Provider, MD    No Known Allergies  History   Social History  . Marital Status: Married    Spouse Name: Deniece Portela    Number of Children: 1  . Years of Education: 16   Occupational History  . retired Runner, broadcasting/film/video    Social History Main Topics  . Smoking status: Never Smoker   . Smokeless tobacco: Never Used  . Alcohol Use: No  . Drug Use: No  . Sexually Active: Yes -- Female partner(s)    Birth Control/ Protection: Surgical   Other Topics Concern  . Not on file   Social History Narrative   Lives with her husband and her granddaughter, who she has raised as her own.  Marriage has been unhappy for a long  time, and planned to leave when her granddaughter left for college.  She's currently at [employer] in [place], but is unhappy and wants to move home.    Family History  Problem Relation Age of Onset  . Cancer Mother     uterine  . Cancer Father     prostate  . Cancer Brother     colon  . Cancer Maternal Aunt     breast  . Cancer Maternal Grandmother     breast  . Cancer Paternal Grandmother     colon  . Cancer Brother     prostate  . Cancer Brother     prostate  . Cancer Brother     prostate  . Diabetes Maternal Grandfather    Review of Systems Leg soreness after extra walking yesterday.  Has had some increased fatigue over the winter, so started back on OTC vitamin D. Some increase in nasal congestion and drainage consistent with seasonal allergies she's had in the past. No chest pain, SOB, HA, dizziness, vision change, N/V, diarrhea, constipation, dysuria, urinary urgency or frequency, or rash.     Objective:   Physical Exam  Blood pressure 105/68, pulse 68, temperature 97.7 F (36.5 C), resp. rate 16, height 5' 2.5" (1.588 m), weight 225 lb (102.059 kg). Body mass index is 40.47 kg/(m^2). Well-developed, well nourished BF who is awake, alert and oriented, in NAD. HEENT: [place]/AT, sclera and conjunctiva are clear.  EAC are patent, TMs are normal in appearance. Nasal mucosa is pink and moist. OP is clear. Neck: supple, non-tender, no lymphadenopathy, thyromegaly. Heart: RRR, no murmur Lungs: normal effort, CTA Extremities: no cyanosis, clubbing or edema. Skin: warm and dry without rash. Psychologic: good mood and appropriate affect, normal speech and behavior.     Assessment & Plan:  Depression - Plan: buPROPion (WELLBUTRIN XL) 150 MG 24 hr tablet  HTN (hypertension) - Plan: metoprolol succinate (TOPROL-XL) 25 MG 24 hr tablet, triamterene-hydrochlorothiazide (MAXZIDE-25) 37.5-25 MG per tablet, CBC with Differential, Comprehensive metabolic panel  Hyperlipidemia  - Plan: ezetimibe (ZETIA) 10 MG tablet, simvastatin (ZOCOR) 40 MG tablet, Lipid panel  Unspecified hypothyroidism - Plan: levothyroxine (SYNTHROID, LEVOTHROID) 125 MCG tablet, TSH  Vitamin D deficiency - Plan: Vitamin D 25 hydroxy  Need for hepatitis C screening test - Plan: Hepatitis C antibody  Allergic rhinitis - Plan:  OTC antihistamine.  If  inadequate, will add steroid nasal spray.  Fernande Bras, PA-C Physician Assistant-Certified [facility]

## 2012-11-23 NOTE — Patient Instructions (Addendum)
Keep up the good work, with getting outside and getting exercise.  Keep making good eating choices! Try an OTC antihistamine for your allergy symptoms.  Try Claritin or Zyrtec or Allegra.

## 2012-11-24 LAB — COMPREHENSIVE METABOLIC PANEL
ALT: 27 U/L (ref 0–35)
CO2: 27 mEq/L (ref 19–32)
Calcium: 9.1 mg/dL (ref 8.4–10.5)
Chloride: 106 mEq/L (ref 96–112)
Glucose, Bld: 92 mg/dL (ref 70–99)
Sodium: 142 mEq/L (ref 135–145)
Total Bilirubin: 0.8 mg/dL (ref 0.3–1.2)
Total Protein: 6.6 g/dL (ref 6.0–8.3)

## 2012-11-24 LAB — CBC WITH DIFFERENTIAL/PLATELET
Eosinophils Absolute: 0.3 10*3/uL (ref 0.0–0.7)
Eosinophils Relative: 3 % (ref 0–5)
Hemoglobin: 12.6 g/dL (ref 12.0–15.0)
Lymphocytes Relative: 34 % (ref 12–46)
Lymphs Abs: 2.7 10*3/uL (ref 0.7–4.0)
MCH: 24.4 pg — ABNORMAL LOW (ref 26.0–34.0)
MCV: 75.4 fL — ABNORMAL LOW (ref 78.0–100.0)
Monocytes Relative: 5 % (ref 3–12)
Neutrophils Relative %: 57 % (ref 43–77)
Platelets: 365 10*3/uL (ref 150–400)
RBC: 5.16 MIL/uL — ABNORMAL HIGH (ref 3.87–5.11)
WBC: 7.9 10*3/uL (ref 4.0–10.5)

## 2012-11-24 LAB — VITAMIN D 25 HYDROXY (VIT D DEFICIENCY, FRACTURES): Vit D, 25-Hydroxy: 23 ng/mL — ABNORMAL LOW (ref 30–89)

## 2012-11-24 LAB — LIPID PANEL: Cholesterol: 198 mg/dL (ref 0–200)

## 2012-11-24 LAB — TSH: TSH: 0.387 u[IU]/mL (ref 0.350–4.500)

## 2012-11-24 LAB — HEPATITIS C ANTIBODY: HCV Ab: NEGATIVE

## 2012-11-27 ENCOUNTER — Encounter: Payer: Self-pay | Admitting: Physician Assistant

## 2012-12-23 ENCOUNTER — Other Ambulatory Visit: Payer: Self-pay | Admitting: Physician Assistant

## 2012-12-28 ENCOUNTER — Encounter: Payer: Self-pay | Admitting: Physician Assistant

## 2012-12-28 ENCOUNTER — Ambulatory Visit (INDEPENDENT_AMBULATORY_CARE_PROVIDER_SITE_OTHER): Payer: BC Managed Care – PPO | Admitting: Physician Assistant

## 2012-12-28 VITALS — BP 101/74 | HR 70 | Temp 98.1°F | Resp 18 | Ht 61.0 in | Wt 226.4 lb

## 2012-12-28 DIAGNOSIS — R7989 Other specified abnormal findings of blood chemistry: Secondary | ICD-10-CM

## 2012-12-28 LAB — CBC WITH DIFFERENTIAL/PLATELET
Basophils Relative: 1 % (ref 0–1)
HCT: 40.4 % (ref 36.0–46.0)
Hemoglobin: 12.9 g/dL (ref 12.0–15.0)
Lymphocytes Relative: 35 % (ref 12–46)
Lymphs Abs: 3.1 10*3/uL (ref 0.7–4.0)
Monocytes Absolute: 0.6 10*3/uL (ref 0.1–1.0)
Monocytes Relative: 7 % (ref 3–12)
Neutro Abs: 4.8 10*3/uL (ref 1.7–7.7)
Neutrophils Relative %: 54 % (ref 43–77)
RBC: 5.4 MIL/uL — ABNORMAL HIGH (ref 3.87–5.11)
WBC: 8.8 10*3/uL (ref 4.0–10.5)

## 2012-12-30 NOTE — Progress Notes (Signed)
  Subjective:    Patient ID: Jordan [name], female    DOB: 08-26-1950, 62 y.o.   MRN: [id number]  HPI This 62 y.o. female presents for evaluation of abnormal CBC noted at her visit 4 weeks ago.  She's had a mild microcytosis for a while, now with RDW trending up. She feels well.  No new problems or concerns.   Review of Systems Negative for CP, SOB, HA, dizziness.    Objective:   Physical Exam  BP 101/74  Pulse 70  Temp(Src) 98.1 F (36.7 C) (Oral)  Resp 18  Ht 5\' 1"  (1.549 m)  Wt 226 lb 6.4 oz (102.694 kg)  BMI 42.8 kg/m2  SpO2 98% WDWNBF, A&O x 3. Heart has a RRR, no murmurs, rubs or gallops. Lungs with normal effort, CTA. Neck is supple, non-tender.     Assessment & Plan:  Abnormal CBC - Plan: CBC with Differential  Fernande Bras, PA-C Physician Assistant-Certified [facility]

## 2013-01-24 ENCOUNTER — Other Ambulatory Visit: Payer: Self-pay | Admitting: Physician Assistant

## 2013-02-09 LAB — CBC AND DIFFERENTIAL
HCT: 38 % (ref 36–46)
Hemoglobin: 11.8 g/dL — AB (ref 12.0–16.0)
Platelets: 397 10*3/uL (ref 150–399)

## 2013-02-09 LAB — TSH: TSH: 0.56 u[IU]/mL (ref 0.41–5.90)

## 2013-02-12 ENCOUNTER — Encounter: Payer: Self-pay | Admitting: Physician Assistant

## 2013-02-12 DIAGNOSIS — K219 Gastro-esophageal reflux disease without esophagitis: Secondary | ICD-10-CM | POA: Insufficient documentation

## 2013-02-23 ENCOUNTER — Other Ambulatory Visit: Payer: Self-pay | Admitting: Physician Assistant

## 2013-04-25 ENCOUNTER — Encounter: Payer: Self-pay | Admitting: Physician Assistant

## 2013-04-25 DIAGNOSIS — D509 Iron deficiency anemia, unspecified: Secondary | ICD-10-CM

## 2013-04-25 DIAGNOSIS — C189 Malignant neoplasm of colon, unspecified: Secondary | ICD-10-CM

## 2013-04-30 ENCOUNTER — Encounter: Payer: Self-pay | Admitting: Physician Assistant

## 2013-06-07 ENCOUNTER — Other Ambulatory Visit: Payer: Self-pay | Admitting: Physician Assistant

## 2013-06-07 ENCOUNTER — Encounter: Payer: Self-pay | Admitting: Physician Assistant

## 2013-06-07 ENCOUNTER — Ambulatory Visit (INDEPENDENT_AMBULATORY_CARE_PROVIDER_SITE_OTHER): Payer: BC Managed Care – PPO | Admitting: Physician Assistant

## 2013-06-07 VITALS — BP 92/64 | HR 66 | Temp 98.2°F | Resp 16 | Ht 61.5 in | Wt 230.0 lb

## 2013-06-07 DIAGNOSIS — E559 Vitamin D deficiency, unspecified: Secondary | ICD-10-CM

## 2013-06-07 DIAGNOSIS — G4700 Insomnia, unspecified: Secondary | ICD-10-CM

## 2013-06-07 DIAGNOSIS — Z23 Encounter for immunization: Secondary | ICD-10-CM

## 2013-06-07 DIAGNOSIS — I10 Essential (primary) hypertension: Secondary | ICD-10-CM

## 2013-06-07 DIAGNOSIS — E785 Hyperlipidemia, unspecified: Secondary | ICD-10-CM

## 2013-06-07 DIAGNOSIS — R51 Headache: Secondary | ICD-10-CM

## 2013-06-07 LAB — CBC WITH DIFFERENTIAL/PLATELET
Eosinophils Relative: 3 % (ref 0–5)
HCT: 45.1 % (ref 36.0–46.0)
Lymphocytes Relative: 34 % (ref 12–46)
Lymphs Abs: 2.9 10*3/uL (ref 0.7–4.0)
MCV: 81.6 fL (ref 78.0–100.0)
Neutro Abs: 5 10*3/uL (ref 1.7–7.7)
Platelets: 262 10*3/uL (ref 150–400)
RBC: 5.53 MIL/uL — ABNORMAL HIGH (ref 3.87–5.11)
WBC: 8.7 10*3/uL (ref 4.0–10.5)

## 2013-06-07 LAB — COMPREHENSIVE METABOLIC PANEL
ALT: 40 U/L — ABNORMAL HIGH (ref 0–35)
AST: 33 U/L (ref 0–37)
Albumin: 3.7 g/dL (ref 3.5–5.2)
CO2: 24 mEq/L (ref 19–32)
Calcium: 9.1 mg/dL (ref 8.4–10.5)
Chloride: 103 mEq/L (ref 96–112)
Creat: 0.9 mg/dL (ref 0.50–1.10)
Potassium: 3.6 mEq/L (ref 3.5–5.3)
Total Protein: 6.7 g/dL (ref 6.0–8.3)

## 2013-06-07 LAB — LIPID PANEL
Cholesterol: 185 mg/dL (ref 0–200)
Total CHOL/HDL Ratio: 2.6 Ratio

## 2013-06-07 NOTE — Progress Notes (Signed)
I have examined this patient along with the student and agree.  

## 2013-06-07 NOTE — Progress Notes (Signed)
Subjective:    Patient ID: Jordan [name], female    DOB: [birth year], 61 y.o.   MRN: [id number]  Headache  Associated symptoms include dizziness. Pertinent negatives include no fever, numbness, rhinorrhea, sinus pressure or weakness.    Jordan [name] is a 62 YO female who presents today for evaluation of insomnia, headaches, and her chronic medical problems  The patient has hypertension and is currently taking Metoprolol and Triamterene/HCTZ.  Her BP today is 92/64.  She has a history of vertigo and denies any increased dizziness recently compared to her typical vertigo symptoms.  She denies any falls.    The patient has had trouble sleeping intermittently for the past two weeks, to the point where she will stay up the entire night without sleep.  She is unaware of what is causing this.    She also states that she has been having intermittent headaches, 2-3 a week, for the past 2 weeks.  She describes her worst headache was this past Tuesday where she describes her headache to be a 9/10 on the pain severity scale and located towards the front of her forehead and lasting 30 minutes.  She states that it did not feel like a typical congestion headache and denies any runny nose or nasal congestion.  She has tried Dayquil and Ibuprofen with mild relief.    Jordan [name] reports not taking her Wellbutrin for the past few weeks, attending multiple funerals recently, as well as having increased stress levels due to her relationship with her husband.   Patient Active Problem List   Diagnosis Date Noted  . Anemia, iron deficiency 04/25/2013  . GERD (gastroesophageal reflux disease) 02/12/2013  . Colon cancer 01/31/2012  . HTN (hypertension) 10/21/2011  . Unspecified hypothyroidism 10/21/2011  . Depression 10/21/2011  . Hyperlipidemia 10/21/2011  . Lung nodules 10/21/2011  . Vertigo 10/21/2011  . Vitamin D deficiency 10/21/2011   Past Medical History  Diagnosis Date  . Hyperlipidemia     takes  Zetia and Zocor daily  . Thyroid disease   . Nasal congestion   . Leg swelling   . Constipation   . Nausea   . Generalized headaches     due to allergies, sinus  . PONV (postoperative nausea and vomiting)     pt states she is very easy to sedate  . Hypertension     takes Maxzide and Metoprolol daily  . Pneumonia     walking about 6-69yrs ago  . History of bronchitis     last time about 6-19yrs ago  . Hx of seasonal allergies     takes OTC allergy med nightly  . History of migraine     last one about 15+yrs ago  . Dizziness     r/t side effects from meds  . Vertigo     takes Meclizine prn  . Joint pain   . Back pain     buldging disc  . GERD (gastroesophageal reflux disease)     takes Protonix as needed  . Hemorrhoids   . History of colon polyps   . Mass of colon   . Cancer     colon  . History of UTI   . Hypothyroidism     takes Synthroid daily  . Depression     takes Wellbutrin daily   Past Surgical History  Procedure Laterality Date  . Cesarean section  1968, 1970  . Knee surgery  1998    right - arthroscopic  . Abdominal hysterectomy    .  Exploratory laparotomy    . Carpal tunnel release    . Colonoscopy    . Esophagogastroduodenoscopy    . Partial colectomy  03/30/2012    Procedure: PARTIAL COLECTOMY;  Surgeon: Cherylynn Ridges, MD;  Location: [facility] OR;  Service: General;  Laterality: N/A;   Current Outpatient Prescriptions on File Prior to Visit  Medication Sig Dispense Refill  . aspirin 81 MG tablet Take 81 mg by mouth daily.      . cholecalciferol (VITAMIN D) 1000 UNITS tablet Take 1,000 Units by mouth daily.      Marland Kitchen ezetimibe (ZETIA) 10 MG tablet Take 1 tablet (10 mg total) by mouth daily.  30 tablet  5  . levothyroxine (SYNTHROID, LEVOTHROID) 125 MCG tablet TAKE 1 TABLET BY MOUTH DAILY  30 tablet  0  . metoprolol succinate (TOPROL-XL) 25 MG 24 hr tablet Take 1 tablet (25 mg total) by mouth daily.  30 tablet  5  . nitroGLYCERIN (NITROSTAT) 0.4 MG SL tablet  Place 1 tablet (0.4 mg total) under the tongue every 5 (five) minutes as needed for chest pain.  30 tablet  1  . simvastatin (ZOCOR) 40 MG tablet Take 1 tablet (40 mg total) by mouth at bedtime.  30 tablet  5  . triamterene-hydrochlorothiazide (MAXZIDE-25) 37.5-25 MG per tablet Take 1 each (1 tablet total) by mouth daily.  30 tablet  5  . buPROPion (WELLBUTRIN XL) 150 MG 24 hr tablet Take 1 tablet (150 mg total) by mouth every morning.  30 tablet  5   No current facility-administered medications on file prior to visit.      Review of Systems  Constitutional: Positive for fatigue. Negative for fever, chills and unexpected weight change.  HENT: Negative for congestion, rhinorrhea and sinus pressure.   Respiratory: Negative for shortness of breath.   Cardiovascular: Negative for chest pain and leg swelling.  Neurological: Positive for dizziness and headaches. Negative for weakness and numbness.       Objective:   Physical Exam Blood pressure 92/64, pulse 66, temperature 98.2 F (36.8 C), resp. rate 16, height 5' 1.5" (1.562 m), weight 230 lb (104.327 kg). Body mass index is 42.76 kg/(m^2). Well-developed, well nourished BF who is awake, alert and oriented, in NAD. HEENT: [place]/AT, PERRL, EOMI.  Sclera and conjunctiva are clear.  EAC are patent, TMs are normal in appearance. Nasal mucosa is pink and moist. OP is clear. Neck: supple, non-tender, no lymphadenopathy, thyromegaly. Heart: RRR, no murmur Lungs: normal effort, CTA Extremities: no cyanosis, clubbing or edema. Skin: warm and dry without rash. Psychologic: good mood and appropriate affect, normal speech and behavior.        Assessment & Plan:  HTN (hypertension) - Plan: CBC with Differential, Comprehensive metabolic panel.  Maintain BP log for next 2 weeks.  Consider decreasing Metoprolol if hypotension persists.    Need for prophylactic vaccination and inoculation against influenza - Plan: Flu Vaccine QUAD 36+ mos  IM  Hyperlipidemia - Plan: Lipid panel  Vitamin D deficiency - Plan: Vit D  25 hydroxy (rtn osteoporosis monitoring)  Insomnia - Plan: TSH; possibly due to increased stress and not taking Wellbutrin x 4 weeks, as symptoms began at the time she stopped it.  Headache(784.0) -likely secondary to Insomnia.  Restart Wellbutrin and consider sleep aid, though if symptom persists, will plan additional evaluation.  Patient Instructions  Check your blood pressure twice each week, and record the results. If your blood pressure is consistently running <100, let me know-we'll reduce  your blood pressure medications.  I will contact you with your lab results as soon as they are available.   If you have not heard from me in 2 weeks, please contact me.  The fastest way to get your results is to register for My Chart (see the instructions on the last page of this printout).

## 2013-06-07 NOTE — Patient Instructions (Signed)
Check your blood pressure twice each week, and record the results. If your blood pressure is consistently running <100, let me know-we'll reduce your blood pressure medications.  I will contact you with your lab results as soon as they are available.   If you have not heard from me in 2 weeks, please contact me.  The fastest way to get your results is to register for My Chart (see the instructions on the last page of this printout).

## 2013-06-08 LAB — VITAMIN D 25 HYDROXY (VIT D DEFICIENCY, FRACTURES): Vit D, 25-Hydroxy: 39 ng/mL (ref 30–89)

## 2013-06-08 LAB — TSH: TSH: 6.156 u[IU]/mL — ABNORMAL HIGH (ref 0.350–4.500)

## 2013-06-11 LAB — T3, FREE: T3, Free: 2.6 pg/mL (ref 2.3–4.2)

## 2013-06-12 ENCOUNTER — Other Ambulatory Visit: Payer: Self-pay | Admitting: Physician Assistant

## 2013-06-12 DIAGNOSIS — E785 Hyperlipidemia, unspecified: Secondary | ICD-10-CM

## 2013-06-12 DIAGNOSIS — F32A Depression, unspecified: Secondary | ICD-10-CM

## 2013-06-12 DIAGNOSIS — I10 Essential (primary) hypertension: Secondary | ICD-10-CM

## 2013-06-12 DIAGNOSIS — F329 Major depressive disorder, single episode, unspecified: Secondary | ICD-10-CM

## 2013-06-12 MED ORDER — BUPROPION HCL ER (XL) 150 MG PO TB24: 150.0000 mg | ORAL_TABLET | ORAL | Status: DC

## 2013-06-12 MED ORDER — SIMVASTATIN 40 MG PO TABS: 40.0000 mg | ORAL_TABLET | Freq: Every day | ORAL | Status: DC

## 2013-06-12 MED ORDER — METOPROLOL SUCCINATE ER 25 MG PO TB24: 25.0000 mg | ORAL_TABLET | Freq: Every day | ORAL | Status: DC

## 2013-06-12 MED ORDER — LEVOTHYROXINE SODIUM 125 MCG PO TABS: ORAL_TABLET | ORAL | Status: DC

## 2013-06-12 MED ORDER — EZETIMIBE 10 MG PO TABS: 10.0000 mg | ORAL_TABLET | Freq: Every day | ORAL | Status: DC

## 2013-06-12 MED ORDER — TRIAMTERENE-HCTZ 37.5-25 MG PO TABS: 1.0000 | ORAL_TABLET | Freq: Every day | ORAL | Status: DC

## 2013-06-27 ENCOUNTER — Other Ambulatory Visit: Payer: Self-pay | Admitting: Physician Assistant

## 2013-07-28 ENCOUNTER — Other Ambulatory Visit: Payer: Self-pay | Admitting: Physician Assistant

## 2013-11-06 ENCOUNTER — Ambulatory Visit (INDEPENDENT_AMBULATORY_CARE_PROVIDER_SITE_OTHER): Payer: Medicare PPO | Admitting: Emergency Medicine

## 2013-11-06 ENCOUNTER — Ambulatory Visit: Payer: Medicare PPO

## 2013-11-06 ENCOUNTER — Encounter: Payer: Self-pay | Admitting: Physician Assistant

## 2013-11-06 VITALS — BP 110/70 | HR 75 | Temp 98.2°F | Resp 16 | Ht 61.0 in | Wt 231.4 lb

## 2013-11-06 DIAGNOSIS — R1011 Right upper quadrant pain: Secondary | ICD-10-CM

## 2013-11-06 DIAGNOSIS — Z23 Encounter for immunization: Secondary | ICD-10-CM

## 2013-11-06 NOTE — Progress Notes (Signed)
   Subjective:    Patient ID: Jordan [name], female    DOB: 1951/05/21, 63 y.o.   MRN: [id number]   PCP: Ardit Danh, PA-C  Chief Complaint  Patient presents with  . pain on right side x 1- 1/2 wks    Medications, allergies, past medical history, surgical history, family history, social history and problem list reviewed and updated.  HPI  Pain under the RIGHT breast x 2 weeks. Worse with sitting, especially on hard surfaces, like the exam table, rolling over in bed. Several days last week she noticed bright red blood on the toilet tissue. Nausea intermittently x 2 weeks. "Nothing tastes good." "My stomach feels bloated, even though there's nothing on it, because eating something makes it worse."  Stools are dark constantly, as she takes an iron supplement (stopped it, but felt bad, so restarted the supplement). She notes her 4 year old father is anemic as well.  An iron infusion was effective for only 2 months, rather than the 6 months expected.  Daily stools are soft and do not require straining.  She notes they've been "skinnier" than ususal.  Recall that she had a partial colectomy for colon cancer in 2013.   Review of Systems Mild cough.    Objective:   Physical Exam  Constitutional: She is oriented to person, place, and time. She appears well-developed and well-nourished. No distress.  BP 110/70  Pulse 75  Temp(Src) 98.2 F (36.8 C) (Oral)  Resp 16  Ht 5\' 1"  (1.549 m)  Wt 231 lb 6.4 oz (104.962 kg)  BMI 43.75 kg/m2  SpO2 98%   Eyes: Conjunctivae are normal. No scleral icterus.  Neck: Neck supple. No thyromegaly present.  Cardiovascular: Normal rate, regular rhythm and normal heart sounds.   Pulmonary/Chest: Effort normal and breath sounds normal.  Abdominal: Soft. Bowel sounds are normal. There is no hepatosplenomegaly. There is tenderness in the right upper quadrant. There is no rigidity, no rebound and no guarding.    Exam is limited due to severe  discomfort with lying supine ("it's pushing up into my chest").  Tenderness in the RUQ.  Lymphadenopathy:    She has no cervical adenopathy.  Neurological: She is alert and oriented to person, place, and time.  Skin: Skin is warm and dry.  Psychiatric: She has a normal mood and affect. Her behavior is normal.   Acute Abdominal Series: [facility] reading (PRIMARY) by  Dr. Everlene Farrier.  Large stool in the ascending colon. No free air. No air-fluid levels. No ileus.         Assessment & Plan:  1. Abdominal pain, right upper quadrant Evaluate for cholelithiasis, though stool load in the ascending colon and hepatic flexure could be the cause. - DG Abd Acute W/Chest - US Abdomen Limited RUQ; Future  2. Need for pneumococcal vaccination - Pneumococcal polysaccharide vaccine 23-valent greater than or equal to 2yo subcutaneous/IM   Fara Chute, PA-C Physician Assistant-Certified [facility]

## 2013-11-14 ENCOUNTER — Ambulatory Visit
Admission: RE | Admit: 2013-11-14 | Discharge: 2013-11-14 | Disposition: A | Discharge status: Home / Self Care | Payer: 59 | Source: Ambulatory Visit | Attending: Physician Assistant | Admitting: Physician Assistant

## 2013-11-14 DIAGNOSIS — R1011 Right upper quadrant pain: Secondary | ICD-10-CM

## 2013-12-24 ENCOUNTER — Other Ambulatory Visit: Payer: Self-pay | Admitting: Physician Assistant

## 2014-01-22 ENCOUNTER — Other Ambulatory Visit: Payer: Self-pay | Admitting: Physician Assistant

## 2014-01-31 ENCOUNTER — Ambulatory Visit (INDEPENDENT_AMBULATORY_CARE_PROVIDER_SITE_OTHER): Payer: Medicare PPO | Admitting: Family Medicine

## 2014-01-31 ENCOUNTER — Encounter: Payer: Self-pay | Admitting: Physician Assistant

## 2014-01-31 ENCOUNTER — Ambulatory Visit (INDEPENDENT_AMBULATORY_CARE_PROVIDER_SITE_OTHER): Payer: Medicare PPO

## 2014-01-31 VITALS — BP 121/74 | HR 88 | Temp 98.5°F | Resp 16 | Ht 61.5 in | Wt 231.8 lb

## 2014-01-31 DIAGNOSIS — E785 Hyperlipidemia, unspecified: Secondary | ICD-10-CM

## 2014-01-31 DIAGNOSIS — M79671 Pain in right foot: Secondary | ICD-10-CM

## 2014-01-31 DIAGNOSIS — F32A Depression, unspecified: Secondary | ICD-10-CM

## 2014-01-31 DIAGNOSIS — I10 Essential (primary) hypertension: Secondary | ICD-10-CM

## 2014-01-31 DIAGNOSIS — F3289 Other specified depressive episodes: Secondary | ICD-10-CM

## 2014-01-31 DIAGNOSIS — R42 Dizziness and giddiness: Secondary | ICD-10-CM

## 2014-01-31 DIAGNOSIS — M79609 Pain in unspecified limb: Secondary | ICD-10-CM

## 2014-01-31 DIAGNOSIS — F329 Major depressive disorder, single episode, unspecified: Secondary | ICD-10-CM

## 2014-01-31 DIAGNOSIS — E039 Hypothyroidism, unspecified: Secondary | ICD-10-CM

## 2014-01-31 LAB — COMPREHENSIVE METABOLIC PANEL
ALK PHOS: 55 U/L (ref 39–117)
ALT: 27 U/L (ref 0–35)
AST: 27 U/L (ref 0–37)
Albumin: 3.7 g/dL (ref 3.5–5.2)
BUN: 10 mg/dL (ref 6–23)
CO2: 29 mEq/L (ref 19–32)
CREATININE: 0.9 mg/dL (ref 0.50–1.10)
Calcium: 9 mg/dL (ref 8.4–10.5)
Chloride: 102 mEq/L (ref 96–112)
GLUCOSE: 93 mg/dL (ref 70–99)
POTASSIUM: 4 meq/L (ref 3.5–5.3)
Sodium: 141 mEq/L (ref 135–145)
Total Bilirubin: 0.6 mg/dL (ref 0.2–1.2)
Total Protein: 6.5 g/dL (ref 6.0–8.3)

## 2014-01-31 LAB — LIPID PANEL
CHOL/HDL RATIO: 2.6 ratio
CHOLESTEROL: 174 mg/dL (ref 0–200)
HDL: 68 mg/dL (ref >39)
LDL Cholesterol: 83 mg/dL (ref 0–99)
Triglycerides: 113 mg/dL (ref <150)
VLDL: 23 mg/dL (ref 0–40)

## 2014-01-31 LAB — POCT CBC
Granulocyte percent: 59.8 %G (ref 37–80)
HEMATOCRIT: 50.1 % — AB (ref 37.7–47.9)
Hemoglobin: 15.8 g/dL (ref 12.2–16.2)
LYMPH, POC: 3.4 (ref 0.6–3.4)
MCH: 28.5 pg (ref 27–31.2)
MCHC: 31.5 g/dL — AB (ref 31.8–35.4)
MCV: 90.2 fL (ref 80–97)
MID (cbc): 0.7 (ref 0–0.9)
MPV: 8.5 fL (ref 0–99.8)
POC Granulocyte: 6 (ref 2–6.9)
POC LYMPH %: 33.4 % (ref 10–50)
POC MID %: 6.8 %M (ref 0–12)
Platelet Count, POC: 276 10*3/uL (ref 142–424)
RBC: 5.55 M/uL — AB (ref 4.04–5.48)
RDW, POC: 14.5 %
WBC: 10.1 10*3/uL (ref 4.6–10.2)

## 2014-01-31 LAB — TSH: TSH: 2.911 u[IU]/mL (ref 0.350–4.500)

## 2014-01-31 MED ORDER — SIMVASTATIN 40 MG PO TABS: 40.0000 mg | ORAL_TABLET | Freq: Every day | ORAL | Status: DC

## 2014-01-31 MED ORDER — LEVOTHYROXINE SODIUM 125 MCG PO TABS: ORAL_TABLET | ORAL | Status: DC

## 2014-01-31 MED ORDER — BUPROPION HCL ER (XL) 150 MG PO TB24: 150.0000 mg | ORAL_TABLET | ORAL | Status: DC

## 2014-01-31 MED ORDER — TRIAMTERENE-HCTZ 37.5-25 MG PO TABS: ORAL_TABLET | ORAL | Status: DC

## 2014-01-31 MED ORDER — EZETIMIBE 10 MG PO TABS: 10.0000 mg | ORAL_TABLET | Freq: Every day | ORAL | Status: DC

## 2014-01-31 MED ORDER — METOPROLOL SUCCINATE ER 25 MG PO TB24: 25.0000 mg | ORAL_TABLET | Freq: Every day | ORAL | Status: DC

## 2014-01-31 MED ORDER — INDOMETHACIN 50 MG PO CAPS: 50.0000 mg | ORAL_CAPSULE | Freq: Three times a day (TID) | ORAL | Status: DC

## 2014-01-31 MED ORDER — MECLIZINE HCL 25 MG PO TABS: 25.0000 mg | ORAL_TABLET | Freq: Four times a day (QID) | ORAL | Status: DC | PRN

## 2014-01-31 NOTE — Patient Instructions (Signed)
I will contact you with your lab results as soon as they are available.   If you have not heard from me in 2 weeks, please contact me.  The fastest way to get your results is to register for My Chart (see the instructions on the last page of this printout).  Wear the boot whenever you are up on your feet. If the pain and swelling persists in 10-14 days, return for additional evaluation.

## 2014-01-31 NOTE — Progress Notes (Signed)
Subjective:    Patient ID: Jordan [name], female    DOB: 1951/03/30, 63 y.o.   MRN: [id number]   PCP: JEFFERY,CHELLE, PA-C  Chief Complaint  Patient presents with  . Medication Refill  . Foot Swelling    on june 10 she wore high heels and now both feet have been swelling since.  right foot is the worse    Medications, allergies, past medical history, surgical history, family history, social history and problem list reviewed and updated.  Patient Active Problem List   Diagnosis Date Noted  . Severe obesity (BMI >= 40) 11/06/2013  . Anemia, iron deficiency 04/25/2013  . GERD (gastroesophageal reflux disease) 02/12/2013  . Colon cancer 01/31/2012  . HTN (hypertension) 10/21/2011  . Unspecified hypothyroidism 10/21/2011  . Depression 10/21/2011  . Hyperlipidemia 10/21/2011  . Lung nodules 10/21/2011  . Vertigo 10/21/2011  . Vitamin D deficiency 10/21/2011    Prior to Admission medications   Medication Sig Start Date End Date Taking? Authorizing Mitsugi Schrader  aspirin 81 MG tablet Take 81 mg by mouth daily.   Yes Historical Coralie Stanke, MD  buPROPion (WELLBUTRIN XL) 150 MG 24 hr tablet Take 1 tablet (150 mg total) by mouth every morning. 06/12/13  Yes Chelle S Jeffery, PA-C  cholecalciferol (VITAMIN D) 1000 UNITS tablet Take 1,000 Units by mouth daily.   Yes Historical Graves Nipp, MD  ezetimibe (ZETIA) 10 MG tablet Take 1 tablet (10 mg total) by mouth daily. 06/12/13  Yes Chelle S Jeffery, PA-C  levothyroxine (SYNTHROID, LEVOTHROID) 125 MCG tablet TAKE 1 TABLET BY MOUTH DAILY 06/12/13  Yes Chelle S Jeffery, PA-C  meclizine (ANTIVERT) 25 MG tablet Take 25 mg by mouth 4 (four) times daily as needed for dizziness.   Yes Historical Teddy Rebstock, MD  metoprolol succinate (TOPROL-XL) 25 MG 24 hr tablet Take 1 tablet (25 mg total) by mouth daily. 06/12/13  Yes Chelle S Jeffery, PA-C  nitroGLYCERIN (NITROSTAT) 0.4 MG SL tablet Place 1 tablet (0.4 mg total) under the tongue every 5 (five) minutes as  needed for chest pain. 11/23/12  Yes Chelle S Jeffery, PA-C  simvastatin (ZOCOR) 40 MG tablet Take 1 tablet (40 mg total) by mouth at bedtime. 06/12/13  Yes Chelle S Jeffery, PA-C  triamterene-hydrochlorothiazide (MAXZIDE-25) 37.5-25 MG per tablet TAKE ONE TABLET DAILY PT NEEDS OFFICE VISIT FOR FURTHER REFILLS 01/22/14  Yes Theda Sers, PA-C    HPI  On 01/23/2014 developed bilateral foot swelling after wearing a pair of heels that she had worn before, but not in a while. The swelling improved on the LEFT, but not at all on the RIGHT and she has significant pain on the RIGHT.  She has a history of RIGHT foot fx in 2003.  No recent trauma/injury recalled. Tried OTC Ibuprofen 600 mg BID, which is "kinda-sorta" helpful.  Also due for refills and labs to assess her chronic medical problems: HTN, anemia, hyperlipidemia, depression.  Overall, she's doing really well.  She feels pretty good.  She needs a refill of meclizine.  Review of Systems As above.    Objective:   Physical Exam  Vitals reviewed. Constitutional: She is oriented to person, place, and time. Vital signs are normal. She appears well-developed and well-nourished. She is active and cooperative. No distress.  BP 121/74  Pulse 88  Temp(Src) 98.5 F (36.9 C) (Oral)  Resp 16  Ht 5' 1.5" (1.562 m)  Wt 231 lb 12.8 oz (105.144 kg)  BMI 43.09 kg/m2  SpO2 97%  HENT:  Head:  Normocephalic and atraumatic.  Right Ear: Hearing normal.  Left Ear: Hearing normal.  Eyes: Conjunctivae are normal. No scleral icterus.  Neck: Normal range of motion. Neck supple. No thyromegaly present.  Cardiovascular: Normal rate, regular rhythm and normal heart sounds.   Pulses:      Radial pulses are 2+ on the right side, and 2+ on the left side.       Dorsalis pedis pulses are 2+ on the right side, and 2+ on the left side.       Posterior tibial pulses are 2+ on the right side, and 2+ on the left side.  Pulmonary/Chest: Effort normal and breath sounds  normal.  Musculoskeletal:       Right ankle: Normal. Achilles tendon normal.       Left ankle: Normal. Achilles tendon normal.       Right foot: She exhibits tenderness (along the distal metatarsals, worse dorsally than plantar, worst medially.), bony tenderness and swelling. She exhibits normal range of motion and normal capillary refill.       Left foot: Normal.  Lymphadenopathy:       Head (right side): No tonsillar, no preauricular, no posterior auricular and no occipital adenopathy present.       Head (left side): No tonsillar, no preauricular, no posterior auricular and no occipital adenopathy present.    She has no cervical adenopathy.       Right: No supraclavicular adenopathy present.       Left: No supraclavicular adenopathy present.  Neurological: She is alert and oriented to person, place, and time. No sensory deficit.  Skin: Skin is warm, dry and intact. No rash noted. No cyanosis or erythema. Nails show no clubbing.  Psychiatric: She has a normal mood and affect.   RIGHT foot: [facility] reading (PRIMARY) by  Dr. Linna Darner.  Degenerative changes at the 1st MTP.  No acute deformity.         Assessment & Plan:  1. Hyperlipidemia Await results.  Continue current treatment. - simvastatin (ZOCOR) 40 MG tablet; Take 1 tablet (40 mg total) by mouth at bedtime.  Dispense: 30 tablet; Refill: 5 - ezetimibe (ZETIA) 10 MG tablet; Take 1 tablet (10 mg total) by mouth daily.  Dispense: 30 tablet; Refill: 5 - Lipid panel  2. Depression Stable/controlled.  Continue current treatment. - buPROPion (WELLBUTRIN XL) 150 MG 24 hr tablet; Take 1 tablet (150 mg total) by mouth every morning.  Dispense: 30 tablet; Refill: 5  3. Essential hypertension Stable/controlled. Continue current treatment. - triamterene-hydrochlorothiazide (MAXZIDE-25) 37.5-25 MG per tablet; 1 PO QAM  Dispense: 30 tablet; Refill: 12 - metoprolol succinate (TOPROL-XL) 25 MG 24 hr tablet; Take 1 tablet (25 mg total) by mouth  daily.  Dispense: 30 tablet; Refill: 5 - POCT CBC - Comprehensive metabolic panel  4. Vertigo Continue current treatment - meclizine (ANTIVERT) 25 MG tablet; Take 1 tablet (25 mg total) by mouth 4 (four) times daily as needed for dizziness.  Dispense: 30 tablet; Refill: 1  5. Unspecified hypothyroidism Await lab results. Continue current treatment. - levothyroxine (SYNTHROID, LEVOTHROID) 125 MCG tablet; TAKE 1 TABLET BY MOUTH DAILY  Dispense: 90 tablet; Refill: 1 - TSH  6. Right foot pain Likely DJD, aggravated by the shoes she wore.  Short CAM walker to reduce weight bearing.  - DG Foot Complete Right; Future - indomethacin (INDOCIN) 50 MG capsule; Take 1 capsule (50 mg total) by mouth 3 (three) times daily with meals.  Dispense: 30 capsule; Refill: 0  Return  in about 6 months (around 08/02/2014).   Fara Chute, PA-C Physician Assistant-Certified [facility]

## 2014-02-05 ENCOUNTER — Encounter: Payer: Self-pay | Admitting: Physician Assistant

## 2014-02-20 ENCOUNTER — Other Ambulatory Visit: Payer: Self-pay | Admitting: Physician Assistant

## 2014-03-13 ENCOUNTER — Other Ambulatory Visit: Payer: Self-pay | Admitting: Physician Assistant

## 2014-03-14 NOTE — Telephone Encounter (Signed)
Chelle, you had orig ordered this w/out RFs. Do you want to RF or have pt RTC?

## 2014-03-19 ENCOUNTER — Telehealth: Payer: Self-pay

## 2014-03-19 NOTE — Telephone Encounter (Signed)
PA needed for indomethacin. Pt tried/failed ibuprofen in past. PA approved on covermymeds, case # [id number]. Pharm notified.

## 2014-03-26 ENCOUNTER — Other Ambulatory Visit: Payer: Self-pay | Admitting: Physician Assistant

## 2014-06-13 ENCOUNTER — Other Ambulatory Visit: Payer: Self-pay | Admitting: Physician Assistant

## 2014-06-17 ENCOUNTER — Encounter: Payer: Self-pay | Admitting: Physician Assistant

## 2014-07-04 ENCOUNTER — Telehealth: Payer: Self-pay | Admitting: Physician Assistant

## 2014-07-04 NOTE — Telephone Encounter (Signed)
Patient states that she was on her way to jury duty and her arthritis suddenly flared up and she is unable to make it. Can she have a letter explaining that she has to wear a boot, what the nature of her condition is, etc.?   [id number]

## 2014-07-04 NOTE — Telephone Encounter (Signed)
Advised pt she would need to be seen in the office. She would rather not come in. Pt wants to be excused from [employer] due to pain. Please advise.

## 2014-07-05 NOTE — Telephone Encounter (Signed)
Pt called and she will RTC if she feels it is necessary.

## 2014-07-05 NOTE — Telephone Encounter (Signed)
Call: Needs seen to see if we feel we can document a need to be off.

## 2014-07-18 ENCOUNTER — Ambulatory Visit: Payer: Medicare PPO | Admitting: Physician Assistant

## 2014-08-09 ENCOUNTER — Other Ambulatory Visit: Payer: Self-pay | Admitting: Physician Assistant

## 2014-08-16 HISTORY — PX: BREAST LUMPECTOMY: SHX2

## 2014-08-18 ENCOUNTER — Other Ambulatory Visit: Payer: Self-pay | Admitting: Physician Assistant

## 2014-08-20 ENCOUNTER — Ambulatory Visit (INDEPENDENT_AMBULATORY_CARE_PROVIDER_SITE_OTHER): Payer: Medicare PPO | Admitting: Physician Assistant

## 2014-08-20 ENCOUNTER — Encounter: Payer: Self-pay | Admitting: Physician Assistant

## 2014-08-20 VITALS — BP 114/80 | HR 87 | Temp 98.9°F | Resp 16 | Ht 61.0 in | Wt 234.0 lb

## 2014-08-20 DIAGNOSIS — E039 Hypothyroidism, unspecified: Secondary | ICD-10-CM

## 2014-08-20 DIAGNOSIS — F329 Major depressive disorder, single episode, unspecified: Secondary | ICD-10-CM

## 2014-08-20 DIAGNOSIS — R42 Dizziness and giddiness: Secondary | ICD-10-CM

## 2014-08-20 DIAGNOSIS — E785 Hyperlipidemia, unspecified: Secondary | ICD-10-CM

## 2014-08-20 DIAGNOSIS — F32A Depression, unspecified: Secondary | ICD-10-CM

## 2014-08-20 DIAGNOSIS — M79671 Pain in right foot: Secondary | ICD-10-CM

## 2014-08-20 DIAGNOSIS — Z1239 Encounter for other screening for malignant neoplasm of breast: Secondary | ICD-10-CM

## 2014-08-20 DIAGNOSIS — Z13820 Encounter for screening for osteoporosis: Secondary | ICD-10-CM

## 2014-08-20 DIAGNOSIS — Z23 Encounter for immunization: Secondary | ICD-10-CM

## 2014-08-20 DIAGNOSIS — I10 Essential (primary) hypertension: Secondary | ICD-10-CM

## 2014-08-20 LAB — LIPID PANEL
Cholesterol: 191 mg/dL (ref 0–200)
HDL: 72 mg/dL (ref >39)
LDL Cholesterol: 96 mg/dL (ref 0–99)
Total CHOL/HDL Ratio: 2.7 Ratio
Triglycerides: 114 mg/dL (ref <150)
VLDL: 23 mg/dL (ref 0–40)

## 2014-08-20 LAB — COMPREHENSIVE METABOLIC PANEL
ALBUMIN: 3.9 g/dL (ref 3.5–5.2)
ALK PHOS: 54 U/L (ref 39–117)
ALT: 24 U/L (ref 0–35)
AST: 23 U/L (ref 0–37)
BUN: 13 mg/dL (ref 6–23)
CHLORIDE: 104 meq/L (ref 96–112)
CO2: 29 mEq/L (ref 19–32)
Calcium: 9 mg/dL (ref 8.4–10.5)
Creat: 1.04 mg/dL (ref 0.50–1.10)
Glucose, Bld: 85 mg/dL (ref 70–99)
POTASSIUM: 4.1 meq/L (ref 3.5–5.3)
SODIUM: 142 meq/L (ref 135–145)
Total Bilirubin: 0.6 mg/dL (ref 0.2–1.2)
Total Protein: 6.9 g/dL (ref 6.0–8.3)

## 2014-08-20 LAB — TSH: TSH: 2.835 u[IU]/mL (ref 0.350–4.500)

## 2014-08-20 MED ORDER — MELOXICAM 15 MG PO TABS: 15.0000 mg | ORAL_TABLET | Freq: Every day | ORAL | Status: DC

## 2014-08-20 MED ORDER — MECLIZINE HCL 25 MG PO TABS: 25.0000 mg | ORAL_TABLET | Freq: Four times a day (QID) | ORAL | Status: DC | PRN

## 2014-08-20 MED ORDER — LEVOTHYROXINE SODIUM 125 MCG PO TABS
ORAL_TABLET | ORAL | Status: DC
Start: 2014-08-20 — End: 2014-09-10

## 2014-08-20 MED ORDER — EZETIMIBE 10 MG PO TABS: 10.0000 mg | ORAL_TABLET | Freq: Every day | ORAL | Status: DC

## 2014-08-20 MED ORDER — SIMVASTATIN 40 MG PO TABS: 40.0000 mg | ORAL_TABLET | Freq: Every day | ORAL | Status: DC

## 2014-08-20 MED ORDER — METOPROLOL SUCCINATE ER 25 MG PO TB24: 25.0000 mg | ORAL_TABLET | Freq: Every day | ORAL | Status: DC

## 2014-08-20 MED ORDER — TRIAMTERENE-HCTZ 37.5-25 MG PO TABS: ORAL_TABLET | ORAL | Status: DC

## 2014-08-20 MED ORDER — BUPROPION HCL ER (XL) 150 MG PO TB24: 150.0000 mg | ORAL_TABLET | Freq: Every day | ORAL | Status: DC

## 2014-08-20 NOTE — Progress Notes (Signed)
Subjective:    Patient ID: Jordan [name], female    DOB: 02-03-1951, 64 y.o.   MRN: [id number]   PCP: Kannan Proia, PA-C  Chief Complaint  Patient presents with  . Medication Refill    Wellbutrin, Zetia, Synthroid, Toprol, Simvastatin  . Medication Problem    Insurance wants her to stop takin indocin because of side effects  . Handicap parking    No Known Allergies  Patient Active Problem List   Diagnosis Date Noted  . Severe obesity (BMI >= 40) 11/06/2013  . Anemia, iron deficiency 04/25/2013  . GERD (gastroesophageal reflux disease) 02/12/2013  . Colon cancer 01/31/2012  . HTN (hypertension) 10/21/2011  . Unspecified hypothyroidism 10/21/2011  . Depression 10/21/2011  . Hyperlipidemia 10/21/2011  . Lung nodules 10/21/2011  . Vertigo 10/21/2011  . Vitamin D deficiency 10/21/2011    Prior to Admission medications   Medication Sig Start Date End Date Taking? Authorizing Provider  aspirin 81 MG tablet Take 81 mg by mouth daily.   Yes Historical Provider, MD  buPROPion (WELLBUTRIN XL) 150 MG 24 hr tablet Take 1 tablet (150 mg total) by mouth daily. PATIENT NEEDS OFFICE VISIT FOR ADDITIONAL REFILLS 08/12/14  Yes Jaycelyn Orrison S Basilia Stuckert, PA-C  ezetimibe (ZETIA) 10 MG tablet Take 1 tablet (10 mg total) by mouth daily. 01/31/14  Yes Chidera Dearcos S Madelein Mahadeo, PA-C  indomethacin (INDOCIN) 50 MG capsule TAKE 1 CAPSULE THREE TIMES A DAY WITH MEALS 06/14/14  Yes Bartley Vuolo S Jason Hauge, PA-C  levothyroxine (SYNTHROID, LEVOTHROID) 125 MCG tablet TAKE 1 TABLET BY MOUTH DAILY 01/31/14  Yes Kawanda Drumheller S Emslee Lopezmartinez, PA-C  meclizine (ANTIVERT) 25 MG tablet Take 1 tablet (25 mg total) by mouth 4 (four) times daily as needed for dizziness. 01/31/14  Yes Ambyr Qadri S Chrislyn Seedorf, PA-C  metoprolol succinate (TOPROL-XL) 25 MG 24 hr tablet Take 1 tablet (25 mg total) by mouth daily. 01/31/14  Yes Poonam Woehrle S Eloisa Chokshi, PA-C  nitroGLYCERIN (NITROSTAT) 0.4 MG SL tablet Place 1 tablet (0.4 mg total) under the tongue every 5 (five) minutes  as needed for chest pain. 11/23/12  Yes Lavontae Cornia S Camp Gopal, PA-C  Prenatal Multivit-Min-Fe-FA (PRENATAL VITAMINS PO) Take by mouth.   Yes Historical Provider, MD  simvastatin (ZOCOR) 40 MG tablet Take 1 tablet (40 mg total) by mouth at bedtime. 01/31/14  Yes Capers Hagmann Janalee Dane, PA-C  triamterene-hydrochlorothiazide (MAXZIDE-25) 37.5-25 MG per tablet TAKE 1 TABLET DAILY 02/21/14  Yes Collene Leyden, PA-C   Medical, Surgical, Family and Social History reviewed and updated.  HPI  Presents for medication refills and to discuss alternatives to indocin. Her insurance sent her a letter that it could pose some risks in people her age. In addition, requests a handicap placard for her vehicle to use when her arthritis pain (shoulder, hands, hips, RIGHT foot) flares up. Not interested in orthopedic evaluation. Continues exercises for maintenance of ROM.  Bought a swimming pool. Plans to use it for exercise once the weather warms up again. Has started a women's group, each woman with different areas of expertise and talent, to support each other independent of men. They meet regularly to help each other do things that may have traditionally been done by their fathers/husbands.  Review of Systems No chest pain, SOB, HA, dizziness, vision change, N/V, diarrhea, constipation, dysuria, urinary urgency or frequency, myalgias, new arthralgias or rash.     Objective:   Physical Exam  Constitutional: She is oriented to person, place, and time. She appears well-developed and well-nourished. No distress.  BP 114/80  mmHg  Pulse 87  Temp(Src) 98.9 F (37.2 C) (Oral)  Resp 16  Ht 5\' 1"  (1.549 m)  Wt 234 lb (106.142 kg)  BMI 44.24 kg/m2  SpO2 96%   Eyes: Conjunctivae are normal. No scleral icterus.  Neck: No thyromegaly present.  Cardiovascular: Normal rate, regular rhythm, normal heart sounds and intact distal pulses.   Pulmonary/Chest: Effort normal and breath sounds normal.  Lymphadenopathy:    She has no  cervical adenopathy.  Neurological: She is alert and oriented to person, place, and time.  Skin: Skin is warm and dry.  Psychiatric: She has a normal mood and affect. Her behavior is normal.          Assessment & Plan:  1. Essential hypertension Controlled. Continue current regimen. Weight loss with increased exercise may reduce need for 2 agents. - CBC with Differential - Comprehensive metabolic panel - triamterene-hydrochlorothiazide (MAXZIDE-25) 37.5-25 MG per tablet; TAKE 1 TABLET DAILY  Dispense: 30 tablet; Refill: 12 - metoprolol succinate (TOPROL-XL) 25 MG 24 hr tablet; Take 1 tablet (25 mg total) by mouth daily.  Dispense: 30 tablet; Refill: 5  2. Hyperlipidemia Await lab results. Expect improvement with weight loss. See above. - Lipid panel - simvastatin (ZOCOR) 40 MG tablet; Take 1 tablet (40 mg total) by mouth at bedtime.  Dispense: 30 tablet; Refill: 5 - ezetimibe (ZETIA) 10 MG tablet; Take 1 tablet (10 mg total) by mouth daily.  Dispense: 30 tablet; Refill: 5  3. Hypothyroidism, unspecified hypothyroidism type Await labs. - TSH - levothyroxine (SYNTHROID, LEVOTHROID) 125 MCG tablet; TAKE 1 TABLET BY MOUTH DAILY  Dispense: 90 tablet; Refill: 1  4. Depression Stable. - buPROPion (WELLBUTRIN XL) 150 MG 24 hr tablet; Take 1 tablet (150 mg total) by mouth daily.   Dispense: 30 tablet; Refill: 5  5. Right foot pain D/C indocin. Trial of meloxicam. Celebrex wasn't covered previously. - meloxicam (MOBIC) 15 MG tablet; Take 1 tablet (15 mg total) by mouth daily.  Dispense: 30 tablet; Refill: 1  6. Vertigo Stable. Intermittent. Continue prn use of meclizine. - meclizine (ANTIVERT) 25 MG tablet; Take 1 tablet (25 mg total) by mouth 4 (four) times daily as needed for dizziness.  Dispense: 30 tablet; Refill: 1  7. Need for prophylactic vaccination and inoculation against influenza - Flu Vaccine QUAD 36+ mos IM  8. Screening for breast cancer - MM Digital Screening;  Future  9. Screening for osteoporosis She is estrogen deficient. Needs DEXA update. - DG Bone Density; Future  Return in about 3 months (around 11/19/2014).  Fara Chute, PA-C Physician Assistant-Certified [facility]

## 2014-08-21 LAB — CBC WITH DIFFERENTIAL/PLATELET
BASOS ABS: 0 10*3/uL (ref 0.0–0.1)
BASOS PCT: 0 % (ref 0–1)
EOS PCT: 3 % (ref 0–5)
Eosinophils Absolute: 0.3 10*3/uL (ref 0.0–0.7)
HCT: 43.1 % (ref 36.0–46.0)
Hemoglobin: 14.5 g/dL (ref 12.0–15.0)
LYMPHS PCT: 30 % (ref 12–46)
Lymphs Abs: 3.1 10*3/uL (ref 0.7–4.0)
MCH: 28.9 pg (ref 26.0–34.0)
MCHC: 33.6 g/dL (ref 30.0–36.0)
MCV: 85.9 fL (ref 78.0–100.0)
MONO ABS: 0.6 10*3/uL (ref 0.1–1.0)
MPV: 10.1 fL (ref 8.6–12.4)
Monocytes Relative: 6 % (ref 3–12)
Neutro Abs: 6.2 10*3/uL (ref 1.7–7.7)
Neutrophils Relative %: 61 % (ref 43–77)
Platelets: 323 10*3/uL (ref 150–400)
RBC: 5.02 MIL/uL (ref 3.87–5.11)
RDW: 14.6 % (ref 11.5–15.5)
WBC: 10.2 10*3/uL (ref 4.0–10.5)

## 2014-08-22 ENCOUNTER — Other Ambulatory Visit: Payer: Self-pay | Admitting: Radiology

## 2014-08-22 ENCOUNTER — Encounter: Payer: Self-pay | Admitting: Physician Assistant

## 2014-08-22 DIAGNOSIS — E2839 Other primary ovarian failure: Secondary | ICD-10-CM

## 2014-08-22 MED ORDER — BUPROPION HCL ER (XL) 150 MG PO TB24: 150.0000 mg | ORAL_TABLET | Freq: Every day | ORAL | Status: DC

## 2014-09-03 ENCOUNTER — Ambulatory Visit
Admission: RE | Admit: 2014-09-03 | Discharge: 2014-09-03 | Disposition: A | Discharge status: Home / Self Care | Payer: Medicare HMO | Source: Ambulatory Visit | Attending: Physician Assistant | Admitting: Physician Assistant

## 2014-09-03 DIAGNOSIS — E2839 Other primary ovarian failure: Secondary | ICD-10-CM

## 2014-09-03 DIAGNOSIS — Z1239 Encounter for other screening for malignant neoplasm of breast: Secondary | ICD-10-CM

## 2014-09-04 ENCOUNTER — Other Ambulatory Visit: Payer: Self-pay | Admitting: Physician Assistant

## 2014-09-04 DIAGNOSIS — R928 Other abnormal and inconclusive findings on diagnostic imaging of breast: Secondary | ICD-10-CM

## 2014-09-06 ENCOUNTER — Other Ambulatory Visit: Payer: Self-pay | Admitting: Physician Assistant

## 2014-09-10 ENCOUNTER — Other Ambulatory Visit: Payer: Self-pay

## 2014-09-10 DIAGNOSIS — F329 Major depressive disorder, single episode, unspecified: Secondary | ICD-10-CM

## 2014-09-10 DIAGNOSIS — E039 Hypothyroidism, unspecified: Secondary | ICD-10-CM

## 2014-09-10 DIAGNOSIS — F32A Depression, unspecified: Secondary | ICD-10-CM

## 2014-09-10 NOTE — Telephone Encounter (Signed)
Pharmacy called requesting refill on pt's Wellbutrin and Levothyroxine.

## 2014-09-11 ENCOUNTER — Other Ambulatory Visit: Payer: Self-pay | Admitting: Physician Assistant

## 2014-09-11 ENCOUNTER — Ambulatory Visit
Admission: RE | Admit: 2014-09-11 | Discharge: 2014-09-11 | Disposition: A | Discharge status: Home / Self Care | Payer: Medicare HMO | Source: Ambulatory Visit | Attending: Physician Assistant | Admitting: Physician Assistant

## 2014-09-11 DIAGNOSIS — R928 Other abnormal and inconclusive findings on diagnostic imaging of breast: Secondary | ICD-10-CM

## 2014-09-11 DIAGNOSIS — N6310 Unspecified lump in the right breast, unspecified quadrant: Secondary | ICD-10-CM

## 2014-09-11 MED ORDER — LEVOTHYROXINE SODIUM 125 MCG PO TABS: ORAL_TABLET | ORAL | Status: DC

## 2014-09-11 MED ORDER — BUPROPION HCL ER (XL) 150 MG PO TB24: 150.0000 mg | ORAL_TABLET | Freq: Every day | ORAL | Status: DC

## 2014-09-19 ENCOUNTER — Other Ambulatory Visit: Payer: Self-pay | Admitting: Physician Assistant

## 2014-09-19 DIAGNOSIS — N6310 Unspecified lump in the right breast, unspecified quadrant: Secondary | ICD-10-CM

## 2014-09-24 ENCOUNTER — Ambulatory Visit
Admission: RE | Admit: 2014-09-24 | Discharge: 2014-09-24 | Disposition: A | Discharge status: Home / Self Care | Payer: Medicare HMO | Source: Ambulatory Visit | Attending: Physician Assistant | Admitting: Physician Assistant

## 2014-09-24 DIAGNOSIS — N6310 Unspecified lump in the right breast, unspecified quadrant: Secondary | ICD-10-CM

## 2014-09-25 ENCOUNTER — Telehealth: Payer: Self-pay | Admitting: Physician Assistant

## 2014-09-25 ENCOUNTER — Other Ambulatory Visit: Payer: Self-pay | Admitting: Physician Assistant

## 2014-09-25 DIAGNOSIS — C50911 Malignant neoplasm of unspecified site of right female breast: Secondary | ICD-10-CM

## 2014-09-25 DIAGNOSIS — C50919 Malignant neoplasm of unspecified site of unspecified female breast: Secondary | ICD-10-CM | POA: Insufficient documentation

## 2014-09-25 NOTE — Telephone Encounter (Signed)
Spoke with patient about her recent diagnosis of RIGHT breast cancer.  Her father is in the hospital, which is making her life more stressful.

## 2014-09-26 ENCOUNTER — Telehealth: Payer: Self-pay | Admitting: *Deleted

## 2014-09-26 NOTE — Telephone Encounter (Signed)
Attempted to schedule pt for [facility] on 10/02/14. Unable to leave vm. Will call again.

## 2014-09-27 ENCOUNTER — Telehealth: Payer: Self-pay | Admitting: *Deleted

## 2014-09-27 DIAGNOSIS — C50311 Malignant neoplasm of lower-inner quadrant of right female breast: Secondary | ICD-10-CM | POA: Insufficient documentation

## 2014-09-27 DIAGNOSIS — Z171 Estrogen receptor negative status [ER-]: Secondary | ICD-10-CM | POA: Insufficient documentation

## 2014-09-27 NOTE — Telephone Encounter (Signed)
Confirmed [facility] for 10/02/14 at 12N .  Instructions and contact information given.

## 2014-10-02 ENCOUNTER — Other Ambulatory Visit (INDEPENDENT_AMBULATORY_CARE_PROVIDER_SITE_OTHER): Payer: Self-pay | Admitting: General Surgery

## 2014-10-02 ENCOUNTER — Encounter: Payer: Self-pay | Admitting: Oncology

## 2014-10-02 ENCOUNTER — Ambulatory Visit
Admission: RE | Admit: 2014-10-02 | Discharge: 2014-10-02 | Disposition: A | Discharge status: Home / Self Care | Payer: Medicare HMO | Source: Ambulatory Visit | Attending: Radiation Oncology | Admitting: Radiation Oncology

## 2014-10-02 ENCOUNTER — Other Ambulatory Visit (HOSPITAL_BASED_OUTPATIENT_CLINIC_OR_DEPARTMENT_OTHER): Payer: Medicare HMO

## 2014-10-02 ENCOUNTER — Ambulatory Visit (HOSPITAL_BASED_OUTPATIENT_CLINIC_OR_DEPARTMENT_OTHER): Payer: Medicare HMO | Admitting: Oncology

## 2014-10-02 ENCOUNTER — Ambulatory Visit: Payer: Medicare HMO

## 2014-10-02 ENCOUNTER — Encounter: Payer: Self-pay | Admitting: General Practice

## 2014-10-02 VITALS — BP 106/65 | HR 70 | Temp 98.2°F | Resp 18 | Ht 61.0 in | Wt 234.3 lb

## 2014-10-02 DIAGNOSIS — C50311 Malignant neoplasm of lower-inner quadrant of right female breast: Secondary | ICD-10-CM

## 2014-10-02 DIAGNOSIS — C50911 Malignant neoplasm of unspecified site of right female breast: Secondary | ICD-10-CM

## 2014-10-02 DIAGNOSIS — Z85038 Personal history of other malignant neoplasm of large intestine: Secondary | ICD-10-CM

## 2014-10-02 DIAGNOSIS — D509 Iron deficiency anemia, unspecified: Secondary | ICD-10-CM

## 2014-10-02 DIAGNOSIS — Z171 Estrogen receptor negative status [ER-]: Secondary | ICD-10-CM

## 2014-10-02 DIAGNOSIS — Z803 Family history of malignant neoplasm of breast: Secondary | ICD-10-CM

## 2014-10-02 LAB — COMPREHENSIVE METABOLIC PANEL (CC13)
ALBUMIN: 3.3 g/dL — AB (ref 3.5–5.0)
ALK PHOS: 52 U/L (ref 40–150)
ALT: 28 U/L (ref 0–55)
AST: 26 U/L (ref 5–34)
Anion Gap: 10 mEq/L (ref 3–11)
BUN: 13.2 mg/dL (ref 7.0–26.0)
CO2: 27 mEq/L (ref 22–29)
Calcium: 8.8 mg/dL (ref 8.4–10.4)
Chloride: 108 mEq/L (ref 98–109)
Creatinine: 1 mg/dL (ref 0.6–1.1)
EGFR: 71 mL/min/{1.73_m2} — AB (ref >90)
Glucose: 96 mg/dl (ref 70–140)
Potassium: 3.5 mEq/L (ref 3.5–5.1)
Sodium: 145 mEq/L (ref 136–145)
Total Bilirubin: 0.52 mg/dL (ref 0.20–1.20)
Total Protein: 6.5 g/dL (ref 6.4–8.3)

## 2014-10-02 LAB — CBC WITH DIFFERENTIAL/PLATELET
BASO%: 0.4 % (ref 0.0–2.0)
Basophils Absolute: 0 10*3/uL (ref 0.0–0.1)
EOS ABS: 0.3 10*3/uL (ref 0.0–0.5)
EOS%: 2.7 % (ref 0.0–7.0)
HEMATOCRIT: 44.5 % (ref 34.8–46.6)
HGB: 14.5 g/dL (ref 11.6–15.9)
LYMPH#: 3.2 10*3/uL (ref 0.9–3.3)
LYMPH%: 32 % (ref 14.0–49.7)
MCH: 28.9 pg (ref 25.1–34.0)
MCHC: 32.6 g/dL (ref 31.5–36.0)
MCV: 88.8 fL (ref 79.5–101.0)
MONO#: 0.6 10*3/uL (ref 0.1–0.9)
MONO%: 6.2 % (ref 0.0–14.0)
NEUT#: 5.8 10*3/uL (ref 1.5–6.5)
NEUT%: 58.7 % (ref 38.4–76.8)
PLATELETS: 246 10*3/uL (ref 145–400)
RBC: 5.01 10*6/uL (ref 3.70–5.45)
RDW: 14.3 % (ref 11.2–14.5)
WBC: 9.8 10*3/uL (ref 3.9–10.3)

## 2014-10-02 MED ORDER — CHLORHEXIDINE GLUCONATE 4 % EX LIQD: 1.0000 "application " | Freq: Once | CUTANEOUS | Status: AC

## 2014-10-02 MED ORDER — DEXTROSE 5 % IV SOLN: 2.0000 g | INTRAVENOUS | Status: AC

## 2014-10-02 MED ORDER — CHLORHEXIDINE GLUCONATE 4 % EX LIQD: 1.0000 "application " | Freq: Once | CUTANEOUS | Status: DC

## 2014-10-02 NOTE — Progress Notes (Signed)
[facility] Psychosocial Distress Screening Spiritual Care  Visited with Jordan [name], who came alone to [facility], to introduce Spiritual Care and [place] team/resources, reviwing distress screen per protocol.  The patient scored a 7 on the Psychosocial Distress Thermometer which indicates severe distress. Also assessed for distress and other psychosocial needs.   ONCBCN DISTRESS SCREENING 10/02/2014  Screening Type Initial Screening  Distress experienced in past week (1-10) 7  Family Problem type Partner;Children  Emotional problem type Adjusting to illness;Isolation/feeling alone  Spiritual/Religous concerns type Facing my mortality  Physical Problem type Sleep/insomnia  Referral to support programs Yes  Other Spiritual Care, counseling interns, [facility]   Jordan [name] shared frankly, at times tearfully, about several key stressors:  family relationships, hx colon cancer, her and her siblings' need to care for aging parents in their decline, and limited support because all of her main people have their own stressors.  She looks forward to opportunities to get out of the house and was pleased to see so many support activities.  Pt used visit well to share and process feelings.  Provided pastoral presence, emotional support, encouragement, and affirmation.  Pt appreciative of lengthy visit with compassionate listener to be her witness as she shared.    Follow up needed: No.   Pt has print materials about support resources.  Particularly encouraged Spiritual Care, counseling, [facility].  She plans to take advantage of massage, yoga, and support programming, possibly bringing her granddaughter for shared experience.  Please also page as needs arise.  Thank you.  [place], [place]

## 2014-10-02 NOTE — Progress Notes (Signed)
[facility] NEW PATIENT EVALUATION  Name: Jordan [name] MRN: [id number]  Date:   10/02/2014           DOB: 05-12-1951  Status: outpatient   CC: JEFFERY,CHELLE, PA-C  Jovita Kussmaul, MD    REFERRING PHYSICIAN: [name], MD   DIAGNOSIS: Clinical stage I A (T1 N0 M0) invasive ductal/DCIS of the right breast   HISTORY OF PRESENT ILLNESS:  Jordan [name] is a 64 y.o. female who is seen today through the courtesy of Dr. Marlou Starks at the breast multidisciplinary clinic for evaluation of her T1 N0 invasive ductal/DCIS of the right breast.  At the time of a screening mammogram on 09/03/2014 she was felt to have a right breast mass.  Additional views and ultrasound on 09/11/2014 showed a mass within the lower inner quadrant of the right breast at 3:30.  Ultrasound showed a 0.5 x 0.4 x 0.4cm mass, 4 cm from the nipple.  The axilla appeared to be benign.  Right breast biopsy on 09/24/2014 was diagnostic for invasive ductal carcinoma, grade II and also high-grade DCIS.  Her invasive disease was ER/PR negative with a Ki-67 of 41%.  HER-2/neu is pending.  She is without complaints today.  She is seen today with Dr. Marlou Starks and Dr. Dr. Jana Hakim.  PREVIOUS RADIATION THERAPY: No   PAST MEDICAL HISTORY:  has a past medical history of Hyperlipidemia; Thyroid disease; Nasal congestion; Leg swelling; Constipation; Nausea; Generalized headaches; PONV (postoperative nausea and vomiting); Hypertension; Pneumonia; History of bronchitis; seasonal allergies; History of migraine; Dizziness; Vertigo; Joint pain; Back pain; GERD (gastroesophageal reflux disease); Hemorrhoids; History of colon polyps; Mass of colon; Cancer; History of UTI; Hypothyroidism; and Depression.     PAST SURGICAL HISTORY:  Past Surgical History  Procedure Laterality Date  . Cesarean section  1968, 1970  . Knee surgery  1998    right - arthroscopic  . Abdominal hysterectomy    . Exploratory laparotomy    .  Carpal tunnel release    . Colonoscopy    . Esophagogastroduodenoscopy    . Partial colectomy  03/30/2012    Procedure: PARTIAL COLECTOMY;  Surgeon: [name], MD;  Location: [facility];  Service: General;  Laterality: N/A;     FAMILY HISTORY: family history includes Cancer in her brother, brother, brother, brother, father, maternal aunt, maternal grandmother, mother, and paternal grandmother; Cancer (age of onset: 53) in her maternal aunt; Deep vein thrombosis in her brother; Diabetes in her maternal grandfather; Mental illness in her sister; Pulmonary embolism in her brother; Seizures (age of onset: 18) in her sister.  Her father died at age 52 and her mother died at age 72.  Her maternal grandmother was diagnosed with breast cancer 89 and a maternal aunt was diagnosed with breast cancer at 51.  Her brother was diagnosed with colorectal cancer at 55.   SOCIAL HISTORY:  reports that she has never smoked. She has never used smokeless tobacco. She reports that she does not drink alcohol or use illicit drugs.  Married, one child, and 1 adopted child.  She also raised her granddaughter.  Retired Oncologist.   ALLERGIES: Review of patient's allergies indicates no known allergies.   MEDICATIONS:  Current Outpatient Prescriptions  Medication Sig Dispense Refill  . aspirin 81 MG tablet Take 81 mg by mouth daily.    Marland Kitchen buPROPion (WELLBUTRIN XL) 150 MG 24 hr tablet Take 1 tablet (150 mg total) by mouth daily. 30 tablet  5  . ezetimibe (ZETIA) 10 MG tablet Take 1 tablet (10 mg total) by mouth daily. 30 tablet 5  . levothyroxine (SYNTHROID, LEVOTHROID) 125 MCG tablet TAKE 1 TABLET BY MOUTH DAILY 90 tablet 1  . meclizine (ANTIVERT) 25 MG tablet Take 1 tablet (25 mg total) by mouth 4 (four) times daily as needed for dizziness. 30 tablet 1  . meloxicam (MOBIC) 15 MG tablet Take 1 tablet (15 mg total) by mouth daily. 30 tablet 1  . metoprolol succinate (TOPROL-XL) 25 MG 24 hr tablet Take 1 tablet (25  mg total) by mouth daily. 30 tablet 5  . nitroGLYCERIN (NITROSTAT) 0.4 MG SL tablet Place 1 tablet (0.4 mg total) under the tongue every 5 (five) minutes as needed for chest pain. 30 tablet 1  . Prenatal Multivit-Min-Fe-FA (PRENATAL VITAMINS PO) Take by mouth.    . simvastatin (ZOCOR) 40 MG tablet Take 1 tablet (40 mg total) by mouth at bedtime. 30 tablet 5  . triamterene-hydrochlorothiazide (MAXZIDE-25) 37.5-25 MG per tablet TAKE 1 TABLET DAILY 30 tablet 12   No current facility-administered medications for this encounter.   Facility-Administered Medications Ordered in Other Encounters  Medication Dose Route Frequency Provider Last Rate Last Dose  . [START ON 10/03/2014] ceFAZolin (ANCEF) 2 g in dextrose 5 % 50 mL IVPB  2 g Intravenous On Call to [place] III, MD      . chlorhexidine (HIBICLENS) 4 % liquid 1 application  1 application Topical Once [name], MD      . Derrill Memo ON 10/03/2014] chlorhexidine (HIBICLENS) 4 % liquid 1 application  1 application Topical Once [name], MD         REVIEW OF SYSTEMS:  Pertinent items are noted in HPI.    PHYSICAL EXAM:  Pleasant 64 year old African-American female appearing younger than her stated age. Wt Readings from Last 3 Encounters:  10/02/14 234 lb 4.8 oz (106.278 kg)  08/20/14 234 lb (106.142 kg)  01/31/14 231 lb 12.8 oz (105.144 kg)   Temp Readings from Last 3 Encounters:  10/02/14 98.2 F (36.8 C) Oral  08/20/14 98.9 F (37.2 C) Oral  01/31/14 98.5 F (36.9 C) Oral   BP Readings from Last 3 Encounters:  10/02/14 106/65  08/20/14 114/80  01/31/14 121/74   Pulse Readings from Last 3 Encounters:  10/02/14 70  08/20/14 87  01/31/14 88   Head and neck examination: Grossly unremarkable.  Nodes: Without palpable cervical, supraclavicular, or axillary lymphadenopathy.  Chest: Lungs clear.  Breasts: There is a punctate biopsy wound along the lower inner quadrant of the right breast at approximately 3:30.  No masses are  appreciated.  Left breast without masses or lesions.  Extremities: Without edema.   LABORATORY DATA:  Lab Results  Component Value Date   WBC 9.8 10/02/2014   HGB 14.5 10/02/2014   HCT 44.5 10/02/2014   MCV 88.8 10/02/2014   PLT 246 10/02/2014   Lab Results  Component Value Date   NA 145 10/02/2014   K 3.5 10/02/2014   CL 104 08/20/2014   CO2 27 10/02/2014   Lab Results  Component Value Date   ALT 28 10/02/2014   AST 26 10/02/2014   ALKPHOS 52 10/02/2014   BILITOT 0.52 10/02/2014      IMPRESSION: Clinical stage IA (T1 N0 M0) invasive ductal/DCIS of the right breast.  She is scheduled for her breast MR this Friday.  I explained to the patient that her local management options include mastectomy versus  partial mastectomy followed by radiation therapy.  She is interested in breast preservation.  She appears to be a candidate for hypofractionated radiation therapy.  We discussed the potential acute and late toxicities of radiation therapy.  Based on her family history, she will be seen by genetic counseling.   PLAN: As discussed above.  I can see her back following her definitive surgery.  I spent 30  minutes face to face with the patient and more than 50% of that time was spent in counseling and/or coordination of care.

## 2014-10-02 NOTE — Progress Notes (Signed)
[facility]  [phone] [phone]     ID: Jordan [name] DOB: 10-04-1950  MR#: [id number]  UUV#:[id number]  Patient Care Team: Fara Chute, PA-C as PCP - General (Physician Assistant) [name], MD as Consulting Physician (General Surgery) Chauncey Cruel, MD as Consulting Physician (Oncology) Rexene Edison, MD as Consulting Physician (Radiation Oncology) [facility], RN as Registered Nurse Roselee Culver, RN as Registered Nurse PCP: Harrison Mons, PA-C GYN: SU:  OTHER MD: [name] MD  CHIEF COMPLAINT: Early stage triple negative breast cancer  CURRENT TREATMENT: Awaiting definitive surgery   BREAST CANCER HISTORY: "Jordan [name]" [facility] 2015 showing breast density category B. A possible mass in the right breast was noted and on Jerry 20 03/05/2015 the patient underwent right diagnostic mammography with right ultrasonography. Spot compression views confirmed a slightly irregular mass in the lower inner quadrant of the right breast. This was not palpable by physical exam. Ultrasound showed a small hypoechoic mass measuring 5 mm in the area in question. Ultrasound of the right axilla was unremarkable.  Biopsy of the mass in question fibrin 05/06/2015 showed ([id number]) and invasive ductal carcinoma, grade 2, estrogen and progesterone receptor negative, with an MIB-1 of 41%, and no HER-2 amplification.  Bilateral breast MRI was obtained 10/04/2014. Results are pending.  The patient's subsequent history is as detailed below   INTERVAL HISTORY: Jordan [name] was evaluated in the multidisciplinary breast cancer clinic 10/02/2014. Her case was discussed in the multidisciplinary breast cancer conference that same morning. Recommendation at that time was for genetics, as well as for breast conserving surgery with sentinel lymph node sampling, to be followed by radiation. The  question whether the patient should have chemotherapy would depend on the final pathology  REVIEW OF SYSTEMS: There were no specific symptoms leading to the original mammogram, which was routinely scheduled. The patient denies unusual headaches, visual changes, nausea, vomiting, stiff neck, dizziness, or gait imbalance. There has been no cough, phlegm production, or pleurisy, no chest pain or pressure, and no change in bowel or bladder habits. The patient denies fever, rash, bleeding, unexplained fatigue or unexplained weight loss. A detailed review of systems was otherwise entirely negative.  PAST MEDICAL HISTORY: Past Medical History  Diagnosis Date  . Hyperlipidemia     takes Zetia and Zocor daily  . Thyroid disease   . Nasal congestion   . Leg swelling   . Constipation   . Nausea   . Generalized headaches     due to allergies, sinus  . PONV (postoperative nausea and vomiting)     pt states she is very easy to sedate  . Hypertension     takes Maxzide and Metoprolol daily  . Pneumonia     walking about 6-64yr ago  . History of bronchitis     last time about 6-755yrago  . Hx of seasonal allergies     takes OTC allergy med nightly  . History of migraine     last one about 15+yrs ago  . Dizziness     r/t side effects from meds  . Vertigo     takes Meclizine prn  . Joint pain   . Back pain     buldging disc  . GERD (gastroesophageal reflux disease)     takes Protonix as needed  . Hemorrhoids   . History of colon polyps   . Mass of colon   .  Cancer     colon  . History of UTI   . Hypothyroidism     takes Synthroid daily  . Depression     takes Wellbutrin daily    PAST SURGICAL HISTORY: Past Surgical History  Procedure Laterality Date  . Cesarean section  1968, 1970  . Knee surgery  1998    right - arthroscopic  . Abdominal hysterectomy    . Exploratory laparotomy    . Carpal tunnel release    . Colonoscopy    . Esophagogastroduodenoscopy    . Partial  colectomy  03/30/2012    Procedure: PARTIAL COLECTOMY;  Surgeon: [name], MD;  Location: [facility];  Service: General;  Laterality: N/A;    FAMILY HISTORY Family History  Problem Relation Age of Onset  . Cancer Mother     uterine  . Cancer Father     prostate  . Cancer Brother     colon  . Deep vein thrombosis Brother   . Cancer Maternal Aunt     breast  . Cancer Maternal Grandmother     breast  . Cancer Paternal Grandmother     colon  . Cancer Brother     prostate  . Pulmonary embolism Brother     long-distance truck driver (PE x 2)  . Cancer Brother     prostate  . Cancer Brother     prostate  . Diabetes Maternal Grandfather   . Cancer Maternal Aunt 75    lung   . Mental illness Sister     history of Depression  . Seizures Sister 8    s/p traumatic brain injury   the patient's parents are still living, as of February 2016. Her father is 37 years old, with a history of prostate cancer diagnosed in his late 73s. The patient's mother is 24 years old. The patient had 5 brothers, 3 sisters. One brother died with prostate cancer at age 1. Another brother has a history of prostate cancer, age 92. One brother had colon cancer diagnosed age 47 as did a paternal grandmother. A maternal grandmother had breast cancer at age 11 as did a maternal aunt (diagnosed age 11.) A cousin was diagnosed with breast cancer the age of 56.  GYNECOLOGIC HISTORY:  No LMP recorded. Patient has had a hysterectomy. Menarche age 56, first live birth age 83. The patient is GX P1. She stopped having periods in 1981 when she underwent hysterectomy and unilateral salpingo-oophorectomy.. She did not take hormone replacement.  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 Her husband [name] owns a business that [employer] at night. The patient's biological son [name] works in Biomedical scientist in [place]. He has 2 children. One of them, the patient's granddaughter [name], 32 years old, lives with  the patient, and is a Interior and spatial designer at [employer]. The patient also has an adopted son, and 1, lives in [place]. Jordan [name] attends a [employer] church in [place] where she grew up    ADVANCED DIRECTIVES: Not in place  HEALTH MAINTENANCE: History  Substance Use Topics  . Smoking status: Never Smoker   . Smokeless tobacco: Never Used  . Alcohol Use: No     Colonoscopy: 03/30/2013/[facility]  PAP:  Bone density:  Lipid panel:  No Known Allergies  Current Outpatient Prescriptions  Medication Sig Dispense Refill  . aspirin 81 MG tablet Take 81 mg by mouth daily.    Marland Kitchen buPROPion (WELLBUTRIN XL) 150 MG 24 hr tablet Take  1 tablet (150 mg total) by mouth daily. 30 tablet 5  . ezetimibe (ZETIA) 10 MG tablet Take 1 tablet (10 mg total) by mouth daily. 30 tablet 5  . levothyroxine (SYNTHROID, LEVOTHROID) 125 MCG tablet TAKE 1 TABLET BY MOUTH DAILY 90 tablet 1  . meclizine (ANTIVERT) 25 MG tablet Take 1 tablet (25 mg total) by mouth 4 (four) times daily as needed for dizziness. 30 tablet 1  . meloxicam (MOBIC) 15 MG tablet Take 1 tablet (15 mg total) by mouth daily. 30 tablet 1  . metoprolol succinate (TOPROL-XL) 25 MG 24 hr tablet Take 1 tablet (25 mg total) by mouth daily. 30 tablet 5  . nitroGLYCERIN (NITROSTAT) 0.4 MG SL tablet Place 1 tablet (0.4 mg total) under the tongue every 5 (five) minutes as needed for chest pain. 30 tablet 1  . Prenatal Multivit-Min-Fe-FA (PRENATAL VITAMINS PO) Take by mouth.    . simvastatin (ZOCOR) 40 MG tablet Take 1 tablet (40 mg total) by mouth at bedtime. 30 tablet 5  . triamterene-hydrochlorothiazide (MAXZIDE-25) 37.5-25 MG per tablet TAKE 1 TABLET DAILY 30 tablet 12   No current facility-administered medications for this visit.    OBJECTIVE: Middle-aged African-American woman who appears stated age 63 Vitals:   10/02/14 1253  BP: 106/65  Pulse: 70  Temp: 98.2 F (36.8 C)  Resp: 18     Body mass index is 44.29 kg/(m^2).     ECOG FS:0 - Asymptomatic  Ocular: Sclerae unicteric, pupils round and equal Ear-nose-throat: Oropharynx clear and moist Lymphatic: No cervical or supraclavicular adenopathy Lungs no rales or rhonchi, good excursion bilaterally Heart regular rate and rhythm, no murmur appreciated Abd soft, obese, nontender, positive bowel sounds MSK no focal spinal tenderness, no upper extremity lymphedema Neuro: non-focal, well-oriented, appropriate affect Breasts: The right breast is status post recent biopsy. I do not palpate a well-defined mass. There are no skin or nipple changes of concern. The right axilla is benign. The left breast is unremarkable   LAB RESULTS:  CMP     Component Value Date/Time   NA 145 10/02/2014 1147   NA 142 08/20/2014 1238   K 3.5 10/02/2014 1147   K 4.1 08/20/2014 1238   CL 104 08/20/2014 1238   CO2 27 10/02/2014 1147   CO2 29 08/20/2014 1238   GLUCOSE 96 10/02/2014 1147   GLUCOSE 85 08/20/2014 1238   BUN 13.2 10/02/2014 1147   BUN 13 08/20/2014 1238   CREATININE 1.0 10/02/2014 1147   CREATININE 1.04 08/20/2014 1238   CREATININE 0.87 03/31/2012 0611   CALCIUM 8.8 10/02/2014 1147   CALCIUM 9.0 08/20/2014 1238   PROT 6.5 10/02/2014 1147   PROT 6.9 08/20/2014 1238   ALBUMIN 3.3* 10/02/2014 1147   ALBUMIN 3.9 08/20/2014 1238   AST 26 10/02/2014 1147   AST 23 08/20/2014 1238   ALT 28 10/02/2014 1147   ALT 24 08/20/2014 1238   ALKPHOS 52 10/02/2014 1147   ALKPHOS 54 08/20/2014 1238   BILITOT 0.52 10/02/2014 1147   BILITOT 0.6 08/20/2014 1238   GFRNONAA 69* 03/31/2012 0611   GFRAA 80* 03/31/2012 0611    INo results found for: SPEP, UPEP  Lab Results  Component Value Date   WBC 9.8 10/02/2014   NEUTROABS 5.8 10/02/2014   HGB 14.5 10/02/2014   HCT 44.5 10/02/2014   MCV 88.8 10/02/2014   PLT 246 10/02/2014      Chemistry      Component Value Date/Time   NA 145 10/02/2014 1147  NA 142 08/20/2014 1238   K 3.5 10/02/2014 1147   K 4.1 08/20/2014  1238   CL 104 08/20/2014 1238   CO2 27 10/02/2014 1147   CO2 29 08/20/2014 1238   BUN 13.2 10/02/2014 1147   BUN 13 08/20/2014 1238   CREATININE 1.0 10/02/2014 1147   CREATININE 1.04 08/20/2014 1238   CREATININE 0.87 03/31/2012 0611      Component Value Date/Time   CALCIUM 8.8 10/02/2014 1147   CALCIUM 9.0 08/20/2014 1238   ALKPHOS 52 10/02/2014 1147   ALKPHOS 54 08/20/2014 1238   AST 26 10/02/2014 1147   AST 23 08/20/2014 1238   ALT 28 10/02/2014 1147   ALT 24 08/20/2014 1238   BILITOT 0.52 10/02/2014 1147   BILITOT 0.6 08/20/2014 1238       No results found for: LABCA2  No components found for: LABCA125  No results for input(s): INR in the last 168 hours.  Urinalysis    Component Value Date/Time   COLORURINE YELLOW 03/09/2011 0857   APPEARANCEUR CLOUDY* 03/09/2011 0857   LABSPEC 1.022 03/09/2011 0857   PHURINE 5.5 03/09/2011 0857   GLUCOSEU NEGATIVE 03/09/2011 0857   HGBUR NEGATIVE 03/09/2011 0857   BILIRUBINUR SMALL* 03/09/2011 0857   KETONESUR NEGATIVE 03/09/2011 0857   PROTEINUR NEGATIVE 03/09/2011 0857   UROBILINOGEN 0.2 03/09/2011 0857   NITRITE NEGATIVE 03/09/2011 0857   LEUKOCYTESUR LARGE* 03/09/2011 0857    STUDIES: Dg Bone Density  09/03/2014   CLINICAL DATA:  Postmenopausal. The patient denies calcium and vitamin-D supplementation.  EXAM: DUAL X-RAY ABSORPTIOMETRY (DXA) FOR BONE MINERAL DENSITY  FINDINGS: AP LUMBAR SPINE L1-L4  Bone Mineral Density (BMD):  1.206 g/cm2  Young Adult T-Score:  1.4  Z-Score:  2.5  Left FEMUR neck  Bone Mineral Density (BMD):  0.739 g/cm2  Young Adult T-Score: -1.0  Z-Score:  -0.2  ASSESSMENT: Patient's diagnostic category is normal by WHO Criteria.  FRACTURE RISK: Not increased  FRAX: World Health Organization FRAX assessment of absolute fracture risk is not calculated for this patient because the patient has T-scores in the normal brain.  COMPARISON: None.  Effective therapies are available in the form of bisphosphonates,  selective estrogen receptor modulators, biologic agents, and hormone replacement therapy (for women). All patients should ensure an adequate intake of dietary calcium (1200 mg daily) and vitamin D (800 IU daily) unless contraindicated.  All treatment decisions require clinical judgment and consideration of individual patient factors, including patient preferences, co-morbidities, previous drug use, risk factors not captured in the FRAX model (e.g., frailty, falls, vitamin D deficiency, increased bone turnover, interval significant decline in bone density) and possible under- or over-estimation of fracture risk by FRAX.  The National Osteoporosis Foundation recommends that FDA-approved medical therapies be considered in postmenopausal women and men age 48 or older with a:  1. Hip or vertebral (clinical or morphometric) fracture.  2. T-score of -2.5 or lower at the spine or hip.  3. Ten-year fracture probability by FRAX of 3% or greater for hip fracture or 20% or greater for major osteoporotic fracture.  People with diagnosed cases of osteoporosis or at high risk for fracture should have regular bone mineral density tests. For patients eligible for Medicare, routine testing is allowed once every 2 years. The testing frequency can be increased to one year for patients who have rapidly progressing disease, those who are receiving or discontinuing medical therapy to restore bone mass, or have additional risk factors.  World Health Organization [facility]) Criteria:  Normal: T-scores  from +1.0 to -1.0  Low Bone Mass (Osteopenia): T-scores between -1.0 and -2.5  Osteoporosis: T-scores -2.5 and below  Comparison to Reference Population:  T-score is the key measure used in the diagnosis of osteoporosis and relative risk determination for fracture. It provides a value for bone mass relative to the mean bone mass of a young adult reference population expressed in terms of standard deviation (SD).  Z-score is the age-matched score  showing the patient's values compared to a population matched for age, sex, and race. This is also expressed in terms of standard deviation. The patient may have values that compare favorably to the age-matched values and still be at increased risk for fracture.   Electronically Signed   By: Shon Hale M.D.   On: 09/03/2014 14:03   Mm Digital Diagnostic Unilat R  09/24/2014   CLINICAL DATA:  Ultrasound-guided core needle biopsy of a a 5 mm mass in the lower inner quadrant of the right breast earlier today. Confirmation of clip placement.  EXAM: DIAGNOSTIC RIGHT MAMMOGRAM POST ULTRASOUND BIOPSY  COMPARISON:  Previous exam(s).  FINDINGS: Mammographic images were obtained following ultrasound guided biopsy of a 5 mm mass in the lower inner quadrant of the right breast. The coil shaped tissue marker clip is appropriately positioned within or immediately adjacent to the mass. Expected post biopsy changes are present without evidence of hematoma.  IMPRESSION: Appropriate positioning of the coil shaped tissue marker clip after ultrasound-guided core needle biopsy of a 5 mm mass in the lower inner quadrant of the right breast.  Final Assessment: Post Procedure Mammograms for Marker Placement   Electronically Signed   By: Evangeline Dakin M.D.   On: 09/24/2014 13:36   Mm Digital Diagnostic Unilat R  09/11/2014   CLINICAL DATA:  Recall from screening mammogram.  EXAM: DIGITAL DIAGNOSTIC  right breast MAMMOGRAM  ULTRASOUND right BREAST  COMPARISON:  09/03/2014, 08/01/2012.  ACR Breast Density Category b: There are scattered areas of fibroglandular density.  FINDINGS: Spot compression views the right breast demonstrate a small slightly irregular mass located within the lower inner quadrant of the right breast at approximately the 3:30 o'clock position 4 cm the nipple.  On physical exam, there is no discrete palpable abnormality within the medial right breast.  Ultrasound is performed, showing a small hypo echoic mass with  low-level internal echoes located within the medial right breast at 3:30 o'clock position 4 cm nipple measuring 5 x 5 x 4 mm size. Tissue sampling is recommended and ultrasound-guided core biopsy is scheduled for 09/24/2014.  Ultrasound of the right axilla demonstrates normal axillary contents and no evidence for adenopathy.  IMPRESSION: 5 mm slightly suspicious mass located within the right breast at the 3:30 o'clock position 4 cm the nipple. Tissue sampling is recommended and ultrasound-guided core biopsy is scheduled for 09/24/2014.  RECOMMENDATION: Right breast ultrasound-guided core biopsy.  I have discussed the findings and recommendations with the patient. Results were also provided in writing at the conclusion of the visit. If applicable, a reminder letter will be sent to the patient regarding the next appointment.  BI-RADS CATEGORY  4: Suspicious.   Electronically Signed   By: Luberta Robertson M.D.   On: 09/11/2014 15:16   Mm Digital Screening  09/04/2014   CLINICAL DATA:  Screening.  EXAM: DIGITAL SCREENING BILATERAL MAMMOGRAM WITH CAD  COMPARISON:  Previous exam(s)  ACR Breast Density Category b: There are scattered areas of fibroglandular density.  FINDINGS: In the right breast, a possible mass  warrants further evaluation with spot compression views and possibly ultrasound. In the left breast, no findings suspicious for malignancy. Images were processed with CAD.  IMPRESSION: Further evaluation is suggested for possible mass in the right breast.  RECOMMENDATION: Diagnostic mammogram and possibly ultrasound of the right breast. (Code:[id number])  The patient will be contacted regarding the findings, and additional imaging will be scheduled.  BI-RADS CATEGORY  0: Incomplete. Need additional imaging evaluation and/or prior mammograms for comparison.   Electronically Signed   By: Abelardo Diesel M.D.   On: 09/04/2014 09:35   US Breast Ltd Uni Right Inc Axilla  09/11/2014   CLINICAL DATA:  Recall from  screening mammogram.  EXAM: DIGITAL DIAGNOSTIC  right breast MAMMOGRAM  ULTRASOUND right BREAST  COMPARISON:  09/03/2014, 08/01/2012.  ACR Breast Density Category b: There are scattered areas of fibroglandular density.  FINDINGS: Spot compression views the right breast demonstrate a small slightly irregular mass located within the lower inner quadrant of the right breast at approximately the 3:30 o'clock position 4 cm the nipple.  On physical exam, there is no discrete palpable abnormality within the medial right breast.  Ultrasound is performed, showing a small hypo echoic mass with low-level internal echoes located within the medial right breast at 3:30 o'clock position 4 cm nipple measuring 5 x 5 x 4 mm size. Tissue sampling is recommended and ultrasound-guided core biopsy is scheduled for 09/24/2014.  Ultrasound of the right axilla demonstrates normal axillary contents and no evidence for adenopathy.  IMPRESSION: 5 mm slightly suspicious mass located within the right breast at the 3:30 o'clock position 4 cm the nipple. Tissue sampling is recommended and ultrasound-guided core biopsy is scheduled for 09/24/2014.  RECOMMENDATION: Right breast ultrasound-guided core biopsy.  I have discussed the findings and recommendations with the patient. Results were also provided in writing at the conclusion of the visit. If applicable, a reminder letter will be sent to the patient regarding the next appointment.  BI-RADS CATEGORY  4: Suspicious.   Electronically Signed   By: Luberta Robertson M.D.   On: 09/11/2014 15:16   [place] Rt Breast Bx W Loc Dev 1st Lesion Img Bx Spec US Guide  09/25/2014   ADDENDUM REPORT: 09/25/2014 12:35  ADDENDUM: Addendum by Dr. Nyoka Cowden on 09/25/2014. I spoke with the patient by telephone to discuss her pathology results. Pathology demonstrates invasive ductal carcinoma which is concordant with the imaging appearance. Multidisciplinary Clinic is scheduled 10/02/2014 at 8 a.m. Breast MRI is scheduled  10/04/2014 at 9:30 a.m. All questions were answered and she reports no problems at the biopsy site.   Electronically Signed   By: Conchita Paris M.D.   On: 09/25/2014 12:35   09/25/2014   CLINICAL DATA:  Screening detected indeterminate 5 mm mass in the lower inner quadrant of the right breast at the 3:30 position approximately 4 cm from the nipple.  EXAM: ULTRASOUND GUIDED RIGHT BREAST CORE NEEDLE BIOPSY  COMPARISON:  Previous exam(s).  PROCEDURE: I met with the patient and we discussed the procedure of ultrasound-guided biopsy, including benefits and alternatives. We discussed the high likelihood of a successful procedure. We discussed the risks of the procedure including infection, bleeding, tissue injury, clip migration, and inadequate sampling. Informed written consent was given. The usual time-out protocol was performed immediately prior to the procedure.  Using sterile technique and 2% Lidocaine as local anesthetic, under direct ultrasound visualization, a 12 gauge vacuum-assisted device was used to perform biopsy of the 5 mm mass in the lower inner  right breast using a medial approach. At the conclusion of the procedure, a coil shaped tissue marker clip was deployed into the biopsy cavity. Follow-up 2-view mammogram was performed and dictated separately.  IMPRESSION: Ultrasound-guided biopsy of a 5 mm mass in the lower inner quadrant of the right breast. No apparent complications.  Electronically Signed: By: Evangeline Dakin M.D. On: 09/24/2014 13:22    ASSESSMENT: 64 y.o.  [place] woman status post right breast lower inner quadrant biopsy  09/24/2014 for a clinical T1a N0, stage IA invasive ductal carcinoma, grade 12, estrogen and progesterone receptor negative, with an MIB-1 of 41%, and HER-2 pending.   (1) breast conserving surgery with sentinel lymph node sampling planned as the first step   (2) the patient will consider chemotherapy based on the final pathology results from her surgery    (3) the patient will benefit from radiation after the completion of  (possible) chemotherapy   (4) status post partial right colectomy with lymphadenectomy 03/30/2012 for a 2.7 cm tubular adenoma with high-grade dysplasia, no evidence of invasion, 0 of 11 lymph nodes involved ([id number])  (5) genetics testing pending  PLAN: We spent the better part of today's hour-long appointment discussing the biology of breast cancer in general, and the specifics of the patient's tumor in particular.  Jordan [name] understands she will need local treatment consisting of surgery and radiation to remove the cancer from the breast and keeping it from coming back in that area.  This systemic therapy question is more complicated. She would get no benefit from antiestrogens and as the tumor is HER-2 negative, she would also not be a candidate for anti-HER-2 immunotherapy. That means the only systemic treatment option for her is chemotherapy.  She is very aware that this and she certainly is not writing chemotherapy off.  However if her tumor proves to be T1a, the risk of distant spread is sufficiently small that the benefit of chemotherapy would be marginal. In that case we could potentially avoid the discomfort and trauma of chemotherapy.   If she does end up receiving chemotherapy, likely we will do cyclophosphamide and docetaxel every 3 weeks 4, which is generally well-tolerated. We did discuss the possible toxicities, side effects and complications of these agents in a preliminary matter today.   In addition, she will need genetics testing since she has a relative under the age of 38 with a history of breast cancer and she herself has  a triple negative tumor. There is also a significant history of colon cancer in the family.  Jordan [name] has a good understanding of the overall plan. She agrees with it. She knows the goal of treatment in her case is cure. She will call with any problems that may develop before her next visit  here.  Chauncey Cruel, MD   10/02/2014 3:19 PM Medical Oncology and Hematology [facility] [address] [place], [place] [address] Tel. [phone]    Fax. [phone]

## 2014-10-02 NOTE — Progress Notes (Signed)
Checked in new pt with no financial concerns prior to seeing the dr. Informed pt if chemo is part of her treatment Raquel will obtain auth from her insurance company as well as contact foundations that offer copay assistance for chemo if needed. She has Raquel's card for any billing questions or concerns.

## 2014-10-02 NOTE — Progress Notes (Signed)
Jordan [name] is a very pleasant 63 y.o. female from [place], [place] with newly diagnosed grade 2-3 invasive ductal carcinoma and foci of DCIS of the right breast.  Biopsy results revealed the tumor's hormone status as ER negative, PR negative, and HER2/neu is currently pending. Ki67 is 41%.  She presents today to the [facility] ([facility]) for treatment consideration and recommendations from the breast surgeon, radiation oncologist, and medical oncologist.     I briefly met with Jordan [name] during her BMDC visit today. We discussed the purpose of the Survivorship Clinic, which will include monitoring for recurrence, coordinating completion of age and gender-appropriate cancer screenings, promotion of overall wellness, as well as managing potential late/long-term side effects of anti-cancer treatments.    As of today, the treatment plan for Jordan [name] will likely include surgery and radiation therapy.  She will meet with the Genetics Counselor due to her family history of breast cancer and personal history of colorectal cancer. The intent of treatment for Jordan [name] is cure, therefore she will be eligible for the Survivorship Clinic upon her completion of treatment.  Her survivorship care plan (SCP) document will be drafted and updated throughout the course of her treatment trajectory. She will receive the SCP in an office visit with myself in the Survivorship Clinic once she has completed treatment.   Jordan [name] was encouraged to ask questions and all questions were answered to her satisfaction.  She was given my business card and encouraged to contact me with any concerns regarding survivorship.  I look forward to participating in her care.   Gretchen Dawson, NP Survivorship Program [facility] [phone] 

## 2014-10-03 ENCOUNTER — Other Ambulatory Visit: Payer: Medicare HMO

## 2014-10-03 ENCOUNTER — Ambulatory Visit (HOSPITAL_BASED_OUTPATIENT_CLINIC_OR_DEPARTMENT_OTHER): Payer: Medicare HMO | Admitting: Genetic Counselor

## 2014-10-03 ENCOUNTER — Encounter: Payer: Self-pay | Admitting: Genetic Counselor

## 2014-10-03 DIAGNOSIS — Z803 Family history of malignant neoplasm of breast: Secondary | ICD-10-CM

## 2014-10-03 DIAGNOSIS — C19 Malignant neoplasm of rectosigmoid junction: Secondary | ICD-10-CM

## 2014-10-03 DIAGNOSIS — C50911 Malignant neoplasm of unspecified site of right female breast: Secondary | ICD-10-CM | POA: Insufficient documentation

## 2014-10-03 DIAGNOSIS — Z315 Encounter for genetic counseling: Secondary | ICD-10-CM

## 2014-10-03 DIAGNOSIS — C50311 Malignant neoplasm of lower-inner quadrant of right female breast: Secondary | ICD-10-CM

## 2014-10-03 DIAGNOSIS — Z85038 Personal history of other malignant neoplasm of large intestine: Secondary | ICD-10-CM | POA: Insufficient documentation

## 2014-10-04 ENCOUNTER — Other Ambulatory Visit: Payer: Self-pay | Admitting: Physician Assistant

## 2014-10-04 ENCOUNTER — Telehealth: Payer: Self-pay | Admitting: Oncology

## 2014-10-04 ENCOUNTER — Ambulatory Visit
Admission: RE | Admit: 2014-10-04 | Discharge: 2014-10-04 | Disposition: A | Discharge status: Home / Self Care | Payer: Medicare HMO | Source: Ambulatory Visit | Attending: Physician Assistant | Admitting: Physician Assistant

## 2014-10-04 DIAGNOSIS — C50911 Malignant neoplasm of unspecified site of right female breast: Secondary | ICD-10-CM

## 2014-10-04 MED ORDER — GADOBENATE DIMEGLUMINE 529 MG/ML IV SOLN
20.0000 mL | Freq: Once | INTRAVENOUS | Status: AC | PRN
  Administered 2014-10-04: 20 mL via INTRAVENOUS

## 2014-10-04 NOTE — Progress Notes (Signed)
[facility] Patient Visit  REFERRING PROVIDER: Fara Chute, PA-C [address] [place], [place] [id number]  PRIMARY PROVIDER:  JEFFERY,CHELLE, PA-C  PRIMARY REASON FOR VISIT:  1. Breast cancer, right breast   2. Colorectal cancer     HISTORY OF PRESENT ILLNESS:   Jordan [name], a 65 y.o. female, was seen for a [facility] cancer genetics consultation at the request of Dr. Jacqulynn Cadet due to a personal and family history of cancer.  [name] presents to clinic today to discuss the possibility of a hereditary predisposition to cancer, genetic testing, and to further clarify her future cancer risks, as well as potential cancer risks for family members.   CANCER HISTORY:    Breast cancer of lower-inner quadrant of right female breast   09/11/2014 Breast US Ultrasound is performed, showing a small hypo echoic mass with low-level internal echoes located within the medial right breast at 3:30 o'clock position 4 cm nipple measuring 5 x 5 x 4 mm size.   09/24/2014 Receptors her2 Estrogen Receptor: 0%, NEGATIVE Progesterone Receptor: 0%, NEGATIVE Proliferation Marker Ki67: 41%   09/24/2014 Initial Biopsy Breast, right, needle core biopsy, mass, LIQ, 3:30 4 cm fn - INVASIVE DUCTAL CARCINOMA. - DUCTAL CARCINOMA IN SITU.   09/27/2014 Initial Diagnosis Breast cancer of lower-inner quadrant of right female breast    Past Medical History  Diagnosis Date   Hyperlipidemia     takes Zetia and Zocor daily   Thyroid disease    Nasal congestion    Leg swelling    Constipation    Nausea    Generalized headaches     due to allergies, sinus   PONV (postoperative nausea and vomiting)     pt states she is very easy to sedate   Hypertension     takes Maxzide and Metoprolol daily   Pneumonia     walking about 6-70yr ago   History of bronchitis     last time about 6-769yrago   Hx of seasonal allergies     takes OTC allergy med nightly   History of  migraine     last one about 15+yrs ago   Dizziness     r/t side effects from meds   Vertigo     takes Meclizine prn   Joint pain    Back pain     buldging disc   GERD (gastroesophageal reflux disease)     takes Protonix as needed   Hemorrhoids    History of colon polyps    Mass of colon    Cancer     colon   History of UTI    Hypothyroidism     takes Synthroid daily   Depression     takes Wellbutrin daily    Past Surgical History  Procedure Laterality Date   Cesarean section  1968, 1970   Knee surgery  1998    right - arthroscopic   Abdominal hysterectomy     Exploratory laparotomy     Carpal tunnel release     Colonoscopy     Esophagogastroduodenoscopy     Partial colectomy  03/30/2012    Procedure: PARTIAL COLECTOMY;  Surgeon: JaGwenyth OberMD;  Location: [facility] OR;  Service: General;  Laterality: N/A;    History   Social History   Marital Status: Married    Spouse Name: WaSumner Number of Children: 1   Years of Education: 16   Occupational History  retired Pharmacist, hospital    Social History Main Topics   Smoking status: Never Smoker    Smokeless tobacco: Never Used   Alcohol Use: No   Drug Use: No   Sexual Activity:    Partners: Male    Patent examiner Protection: Surgical   Other Topics Concern   Not on file   Social History Narrative   Lives with her husband and her granddaughter, who she has raised as her own.  Marriage has been unhappy for a long time, and planned to leave when her granddaughter left for college.  She's currently studying make-up at a community college and may pursue cosmetology in the future.      FAMILY HISTORY:  During the visit, a 4-generation pedigree was obtained. A copy of the pedigree with be scanned into Epic under the Media tab. Significant family history diagnoses include the following: Family History  Problem Relation Age of Onset   Cancer Father 48    prostate   Cancer Brother 5    colon    Deep vein thrombosis Brother    Cancer Maternal Aunt 48    breast   Cancer Maternal Grandmother 78    breast   Cancer Paternal Grandmother     colon   Pulmonary embolism Brother     long-distance truck driver (PE x 2)   Cancer Brother 58    colon   Diabetes Maternal Grandfather    Cancer Maternal Aunt 75    unknown type   Mental illness Sister     history of Depression   Seizures Sister 8    s/p traumatic brain injury   Cancer Maternal Uncle     prostate   Ms. Wurtzel's ancestry is of African American descent. There is no known Jewish ancestry or consanguinity.  GENETIC COUNSELING ASSESSMENT:  Ms. Windish is a 64 y.o. female with a personal and family history of cancer suggestive of a hereditary predisposition to cancer. We, therefore, discussed and recommended the following at today's visit.   DISCUSSION:  We reviewed the characteristics, features and inheritance patterns of hereditary cancer syndromes. We also discussed genetic testing, including the appropriate family members to test, the process of testing, insurance coverage and turn-around-time for results. We discussed the implications of a negative, positive and/or variant of uncertain significant result. We recommended Ms. Dolata pursue genetic testing for the OvaNext gene panel. The OvaNext gene panel offered by [employer] includes sequencing and rearrangement analysis for the following 24 genes:ATM, BARD1, BRCA1, BRCA2, BRIP1, CDH1, CHEK2, EPCAM, MLH1, MRE11A, MSH2, MSH6, MUTYH, NBN, NF1, PALB2, PMS2, PTEN, RAD50, RAD51C, RAD51D, SMARCA4, STK11, and TP53.  PLAN:  Based on our above recommendation, Ms. Azua wished to pursue genetic testing and the blood sample was drawn and will be sent to [facility] for analysis. Results should be available within approximately 6 weeks time, at which point they will be disclosed by telephone to Ms. Hirschi, as will any additional recommendations warranted by  these results. Lastly, we encouraged Ms. Gaumond to remain in contact with cancer genetics annually so that we can continuously update the family history and inform her of any changes in cancer genetics and testing that may be of benefit for this family.   Ms.  Silberman questions were answered to her satisfaction today. Our contact information was provided should additional questions or concerns arise. Thank you for the referral and allowing [place] to share in the care of your patient.   Catherine A. Fine, MS, CGC  Certified Psychologist, sport [email or web address] phone: [phone]  The patient was seen for a total of 40 minutes in face-to-face genetic counseling.  This patient was discussed with Dr. Jana Hakim who agrees with the above.    ______________________________________________________________________ For Office Staff:  Number of people involved in session including genetic counselor: 2 Was an intern or student involved with case: not applicable

## 2014-10-04 NOTE — Telephone Encounter (Signed)
per pof to sch pt appt-cld pt and gave pt time & date of appt

## 2014-10-07 ENCOUNTER — Other Ambulatory Visit (INDEPENDENT_AMBULATORY_CARE_PROVIDER_SITE_OTHER): Payer: Self-pay | Admitting: General Surgery

## 2014-10-07 ENCOUNTER — Telehealth: Payer: Self-pay | Admitting: *Deleted

## 2014-10-07 DIAGNOSIS — C50311 Malignant neoplasm of lower-inner quadrant of right female breast: Secondary | ICD-10-CM

## 2014-10-07 NOTE — Telephone Encounter (Signed)
Spoke with patient from [facility] 2/17.  She is doing well but a little confused about her appointments.  Clarified her appointments and confirmed new appointment with Dr. Jana Hakim for 10/29/14 at 66am.  Encouraged patient to call with any needs or concerns.

## 2014-10-14 ENCOUNTER — Other Ambulatory Visit (INDEPENDENT_AMBULATORY_CARE_PROVIDER_SITE_OTHER): Payer: Self-pay | Admitting: General Surgery

## 2014-10-14 ENCOUNTER — Encounter (HOSPITAL_BASED_OUTPATIENT_CLINIC_OR_DEPARTMENT_OTHER): Payer: Self-pay | Admitting: *Deleted

## 2014-10-14 DIAGNOSIS — C50911 Malignant neoplasm of unspecified site of right female breast: Secondary | ICD-10-CM

## 2014-10-14 NOTE — Progress Notes (Signed)
   10/14/14 1025  OBSTRUCTIVE SLEEP APNEA  Have you ever been diagnosed with sleep apnea through a sleep study? No  Do you snore loudly (loud enough to be heard through closed doors)?  1  Do you often feel tired, fatigued, or sleepy during the daytime? 0  Has anyone observed you stop breathing during your sleep? 0  Do you have, or are you being treated for high blood pressure? 1  BMI more than 35 kg/m2? 1  Age over 66 years old? 1  Gender: 0

## 2014-10-14 NOTE — Progress Notes (Signed)
To come in for ekg-labs done cancer center 10/01/14

## 2014-10-16 ENCOUNTER — Ambulatory Visit
Admission: RE | Admit: 2014-10-16 | Discharge: 2014-10-16 | Disposition: A | Discharge status: Home / Self Care | Payer: Medicare HMO | Source: Ambulatory Visit | Attending: General Surgery | Admitting: General Surgery

## 2014-10-16 ENCOUNTER — Other Ambulatory Visit: Payer: Self-pay

## 2014-10-16 ENCOUNTER — Encounter (HOSPITAL_BASED_OUTPATIENT_CLINIC_OR_DEPARTMENT_OTHER): Payer: Medicare HMO

## 2014-10-16 DIAGNOSIS — C50311 Malignant neoplasm of lower-inner quadrant of right female breast: Secondary | ICD-10-CM

## 2014-10-17 ENCOUNTER — Ambulatory Visit (HOSPITAL_BASED_OUTPATIENT_CLINIC_OR_DEPARTMENT_OTHER)
Admission: RE | Admit: 2014-10-17 | Discharge: 2014-10-17 | Disposition: A | Discharge status: Home / Self Care | Payer: Medicare HMO | Source: Ambulatory Visit | Attending: General Surgery | Admitting: General Surgery

## 2014-10-17 ENCOUNTER — Ambulatory Visit (HOSPITAL_BASED_OUTPATIENT_CLINIC_OR_DEPARTMENT_OTHER): Payer: Medicare HMO | Admitting: Anesthesiology

## 2014-10-17 ENCOUNTER — Encounter (HOSPITAL_BASED_OUTPATIENT_CLINIC_OR_DEPARTMENT_OTHER)
Admission: RE | Disposition: A | Discharge status: Home / Self Care | Payer: Self-pay | Source: Ambulatory Visit | Attending: General Surgery

## 2014-10-17 ENCOUNTER — Ambulatory Visit
Admission: RE | Admit: 2014-10-17 | Discharge: 2014-10-17 | Disposition: A | Discharge status: Home / Self Care | Payer: Medicare HMO | Source: Ambulatory Visit | Attending: General Surgery | Admitting: General Surgery

## 2014-10-17 ENCOUNTER — Encounter (HOSPITAL_COMMUNITY)
Admission: RE | Admit: 2014-10-17 | Discharge: 2014-10-17 | Disposition: A | Discharge status: Home / Self Care | Payer: Medicare HMO | Source: Ambulatory Visit | Attending: General Surgery | Admitting: General Surgery

## 2014-10-17 ENCOUNTER — Encounter (HOSPITAL_BASED_OUTPATIENT_CLINIC_OR_DEPARTMENT_OTHER): Payer: Self-pay | Admitting: *Deleted

## 2014-10-17 DIAGNOSIS — I10 Essential (primary) hypertension: Secondary | ICD-10-CM | POA: Diagnosis not present

## 2014-10-17 DIAGNOSIS — K219 Gastro-esophageal reflux disease without esophagitis: Secondary | ICD-10-CM | POA: Insufficient documentation

## 2014-10-17 DIAGNOSIS — E039 Hypothyroidism, unspecified: Secondary | ICD-10-CM | POA: Diagnosis not present

## 2014-10-17 DIAGNOSIS — C50311 Malignant neoplasm of lower-inner quadrant of right female breast: Secondary | ICD-10-CM | POA: Diagnosis not present

## 2014-10-17 DIAGNOSIS — C50919 Malignant neoplasm of unspecified site of unspecified female breast: Secondary | ICD-10-CM

## 2014-10-17 DIAGNOSIS — R921 Mammographic calcification found on diagnostic imaging of breast: Secondary | ICD-10-CM | POA: Insufficient documentation

## 2014-10-17 DIAGNOSIS — Z85038 Personal history of other malignant neoplasm of large intestine: Secondary | ICD-10-CM | POA: Diagnosis not present

## 2014-10-17 DIAGNOSIS — Z6841 Body Mass Index (BMI) 40.0 and over, adult: Secondary | ICD-10-CM | POA: Insufficient documentation

## 2014-10-17 DIAGNOSIS — Z803 Family history of malignant neoplasm of breast: Secondary | ICD-10-CM | POA: Diagnosis not present

## 2014-10-17 DIAGNOSIS — G43909 Migraine, unspecified, not intractable, without status migrainosus: Secondary | ICD-10-CM | POA: Insufficient documentation

## 2014-10-17 DIAGNOSIS — E780 Pure hypercholesterolemia: Secondary | ICD-10-CM | POA: Insufficient documentation

## 2014-10-17 DIAGNOSIS — Z9889 Other specified postprocedural states: Secondary | ICD-10-CM | POA: Diagnosis not present

## 2014-10-17 DIAGNOSIS — M1990 Unspecified osteoarthritis, unspecified site: Secondary | ICD-10-CM | POA: Insufficient documentation

## 2014-10-17 DIAGNOSIS — N6489 Other specified disorders of breast: Secondary | ICD-10-CM | POA: Diagnosis not present

## 2014-10-17 DIAGNOSIS — Z79899 Other long term (current) drug therapy: Secondary | ICD-10-CM | POA: Diagnosis not present

## 2014-10-17 DIAGNOSIS — C50911 Malignant neoplasm of unspecified site of right female breast: Secondary | ICD-10-CM | POA: Diagnosis not present

## 2014-10-17 HISTORY — PX: RADIOACTIVE SEED GUIDED PARTIAL MASTECTOMY WITH AXILLARY SENTINEL LYMPH NODE BIOPSY: SHX6520

## 2014-10-17 HISTORY — DX: Malignant neoplasm of unspecified site of unspecified female breast: C50.919

## 2014-10-17 HISTORY — DX: Presence of spectacles and contact lenses: Z97.3

## 2014-10-17 LAB — POCT HEMOGLOBIN-HEMACUE: Hemoglobin: 15.2 g/dL — ABNORMAL HIGH (ref 12.0–15.0)

## 2014-10-17 SURGERY — RADIOACTIVE SEED GUIDED PARTIAL MASTECTOMY WITH AXILLARY SENTINEL LYMPH NODE BIOPSY
Anesthesia: Regional | Site: Breast | Laterality: Right

## 2014-10-17 MED ORDER — MIDAZOLAM HCL 2 MG/2ML IJ SOLN
INTRAMUSCULAR | Status: AC
  Filled 2014-10-17: qty 2

## 2014-10-17 MED ORDER — ONDANSETRON HCL 4 MG/2ML IJ SOLN
INTRAMUSCULAR | Status: DC | PRN
  Administered 2014-10-17: 4 mg via INTRAVENOUS

## 2014-10-17 MED ORDER — OXYCODONE HCL 5 MG PO TABS
ORAL_TABLET | ORAL | Status: AC
  Filled 2014-10-17: qty 1

## 2014-10-17 MED ORDER — LACTATED RINGERS IV SOLN
INTRAVENOUS | Status: DC
  Administered 2014-10-17: 13:00:00 via INTRAVENOUS

## 2014-10-17 MED ORDER — BUPIVACAINE-EPINEPHRINE (PF) 0.5% -1:200000 IJ SOLN
INTRAMUSCULAR | Status: DC | PRN
  Administered 2014-10-17: 30 mL

## 2014-10-17 MED ORDER — FENTANYL CITRATE 0.05 MG/ML IJ SOLN
50.0000 ug | INTRAMUSCULAR | Status: DC | PRN
  Administered 2014-10-17 (×2): 50 ug via INTRAVENOUS

## 2014-10-17 MED ORDER — PROMETHAZINE HCL 25 MG/ML IJ SOLN: 6.2500 mg | INTRAMUSCULAR | Status: DC | PRN

## 2014-10-17 MED ORDER — FENTANYL CITRATE 0.05 MG/ML IJ SOLN
INTRAMUSCULAR | Status: AC
  Filled 2014-10-17: qty 2

## 2014-10-17 MED ORDER — OXYCODONE HCL 5 MG/5ML PO SOLN: 5.0000 mg | Freq: Once | ORAL | Status: AC | PRN

## 2014-10-17 MED ORDER — DEXAMETHASONE SODIUM PHOSPHATE 4 MG/ML IJ SOLN
INTRAMUSCULAR | Status: DC | PRN
  Administered 2014-10-17: 10 mg via INTRAVENOUS

## 2014-10-17 MED ORDER — CEFAZOLIN SODIUM-DEXTROSE 2-3 GM-% IV SOLR
INTRAVENOUS | Status: AC
  Filled 2014-10-17: qty 50

## 2014-10-17 MED ORDER — MIDAZOLAM HCL 2 MG/2ML IJ SOLN
1.0000 mg | INTRAMUSCULAR | Status: DC | PRN
  Administered 2014-10-17: 2 mg via INTRAVENOUS

## 2014-10-17 MED ORDER — CHLORHEXIDINE GLUCONATE 4 % EX LIQD: 1.0000 "application " | Freq: Once | CUTANEOUS | Status: DC

## 2014-10-17 MED ORDER — OXYCODONE HCL 5 MG PO TABS
5.0000 mg | ORAL_TABLET | Freq: Once | ORAL | Status: AC | PRN
  Administered 2014-10-17: 5 mg via ORAL

## 2014-10-17 MED ORDER — PROPOFOL 10 MG/ML IV BOLUS
INTRAVENOUS | Status: DC | PRN
  Administered 2014-10-17: 150 mg via INTRAVENOUS

## 2014-10-17 MED ORDER — FENTANYL CITRATE 0.05 MG/ML IJ SOLN
INTRAMUSCULAR | Status: AC
  Filled 2014-10-17: qty 4

## 2014-10-17 MED ORDER — TECHNETIUM TC 99M SULFUR COLLOID FILTERED
1.0000 | Freq: Once | INTRAVENOUS | Status: AC | PRN
  Administered 2014-10-17: 1 via INTRADERMAL

## 2014-10-17 MED ORDER — HYDROMORPHONE HCL 1 MG/ML IJ SOLN
INTRAMUSCULAR | Status: AC
  Filled 2014-10-17: qty 1

## 2014-10-17 MED ORDER — BUPIVACAINE HCL (PF) 0.25 % IJ SOLN
INTRAMUSCULAR | Status: DC | PRN
  Administered 2014-10-17: 20 mL

## 2014-10-17 MED ORDER — CEFAZOLIN SODIUM-DEXTROSE 2-3 GM-% IV SOLR
2.0000 g | INTRAVENOUS | Status: AC
  Administered 2014-10-17: 2 g via INTRAVENOUS

## 2014-10-17 MED ORDER — OXYCODONE-ACETAMINOPHEN 5-325 MG PO TABS: 1.0000 | ORAL_TABLET | ORAL | Status: DC | PRN

## 2014-10-17 MED ORDER — FENTANYL CITRATE 0.05 MG/ML IJ SOLN
INTRAMUSCULAR | Status: DC | PRN
  Administered 2014-10-17: 25 ug via INTRAVENOUS

## 2014-10-17 MED ORDER — LIDOCAINE HCL (CARDIAC) 20 MG/ML IV SOLN
INTRAVENOUS | Status: DC | PRN
  Administered 2014-10-17: 60 mg via INTRAVENOUS

## 2014-10-17 MED ORDER — HYDROMORPHONE HCL 1 MG/ML IJ SOLN
0.2500 mg | INTRAMUSCULAR | Status: DC | PRN
  Administered 2014-10-17: 0.5 mg via INTRAVENOUS

## 2014-10-17 SURGICAL SUPPLY — 44 items
APPLIER CLIP 9.375 MED OPEN (MISCELLANEOUS)
APR CLP MED 9.3 20 MLT OPN (MISCELLANEOUS)
BLADE SURG 15 STRL LF DISP TIS (BLADE) ×1 IMPLANT
BLADE SURG 15 STRL SS (BLADE) ×2
CANISTER SUC SOCK COL 7IN (MISCELLANEOUS) IMPLANT
CANISTER SUCT 1200ML W/VALVE (MISCELLANEOUS) ×1 IMPLANT
CHLORAPREP W/TINT 26ML (MISCELLANEOUS) ×2 IMPLANT
CLIP APPLIE 9.375 MED OPEN (MISCELLANEOUS) ×1 IMPLANT
COVER BACK TABLE 60X90IN (DRAPES) ×2 IMPLANT
COVER MAYO STAND STRL (DRAPES) ×2 IMPLANT
COVER PROBE W GEL 5X96 (DRAPES) ×2 IMPLANT
DECANTER SPIKE VIAL GLASS SM (MISCELLANEOUS) IMPLANT
DEVICE DUBIN W/COMP PLATE 8390 (MISCELLANEOUS) ×2 IMPLANT
DRAPE LAPAROSCOPIC ABDOMINAL (DRAPES) ×2 IMPLANT
DRAPE UTILITY XL STRL (DRAPES) ×2 IMPLANT
ELECT COATED BLADE 2.86 ST (ELECTRODE) ×2 IMPLANT
ELECT REM PT RETURN 9FT ADLT (ELECTROSURGICAL) ×2
ELECTRODE REM PT RTRN 9FT ADLT (ELECTROSURGICAL) ×1 IMPLANT
GLOVE BIO SURGEON STRL SZ 6.5 (GLOVE) ×1 IMPLANT
GLOVE BIO SURGEON STRL SZ7.5 (GLOVE) ×2 IMPLANT
GLOVE BIOGEL PI IND STRL 7.0 (GLOVE) IMPLANT
GLOVE BIOGEL PI INDICATOR 7.0 (GLOVE) ×2
GLOVE SURG SS PI 7.5 STRL IVOR (GLOVE) ×1 IMPLANT
GOWN STRL REUS W/ TWL LRG LVL3 (GOWN DISPOSABLE) ×2 IMPLANT
GOWN STRL REUS W/TWL LRG LVL3 (GOWN DISPOSABLE) ×6
KIT MARKER MARGIN INK (KITS) ×2 IMPLANT
LIQUID BAND (GAUZE/BANDAGES/DRESSINGS) ×2 IMPLANT
NDL HYPO 25X1 1.5 SAFETY (NEEDLE) ×1 IMPLANT
NDL SAFETY ECLIPSE 18X1.5 (NEEDLE) IMPLANT
NEEDLE HYPO 18GX1.5 SHARP (NEEDLE)
NEEDLE HYPO 25X1 1.5 SAFETY (NEEDLE) ×2 IMPLANT
NS IRRIG 1000ML POUR BTL (IV SOLUTION) ×1 IMPLANT
PACK BASIN DAY SURGERY FS (CUSTOM PROCEDURE TRAY) ×2 IMPLANT
PENCIL BUTTON HOLSTER BLD 10FT (ELECTRODE) ×2 IMPLANT
SLEEVE SCD COMPRESS KNEE MED (MISCELLANEOUS) ×2 IMPLANT
SPONGE LAP 18X18 X RAY DECT (DISPOSABLE) ×2 IMPLANT
SUT MON AB 4-0 PC3 18 (SUTURE) ×4 IMPLANT
SUT SILK 2 0 SH (SUTURE) IMPLANT
SUT VICRYL 3-0 CR8 SH (SUTURE) ×2 IMPLANT
SYR CONTROL 10ML LL (SYRINGE) ×2 IMPLANT
TOWEL OR 17X24 6PK STRL BLUE (TOWEL DISPOSABLE) ×2 IMPLANT
TOWEL OR NON WOVEN STRL DISP B (DISPOSABLE) ×2 IMPLANT
TUBE CONNECTING 20X1/4 (TUBING) ×1 IMPLANT
YANKAUER SUCT BULB TIP NO VENT (SUCTIONS) ×1 IMPLANT

## 2014-10-17 NOTE — Progress Notes (Signed)
Assisted Dr. Rodman Comp with right, ultrasound guided, pectoralis block. Side rails up, monitors on throughout procedure. See vital signs in flow sheet. Tolerated Procedure well.Assisted nuc med tech # [phone] with nuc med inj . Side rails up, monitors on throughout procedure. See vital signs in flow sheet. Tolerated Procedure well.

## 2014-10-17 NOTE — H&P (Signed)
Jordan [name] 10/02/2014 9:10 AM Location: [facility] Patient #: [id number] DOB: March 28, 1951 Married / Language: English / Race: Black or African American Female  History of Present Illness Jordan Dibbles S. Marlou Starks MD; 10/02/2014 3:30 PM) The patient is a 64 year old female who presents with breast cancer. We are asked to see the patient in consultation by Dr. Melanee Spry to evaluate her for right breast cancer. The patient is a 64 year old black female who presents with an abnormal routine screening mammogram. The mass was in the right lower inner quadrant and measured 26m by u/s. This was biopsied and came back as breast cancer which was ER and PR - with a Ki67 of 41%. She does have some family history and does not take any hormone replacement. she denies any breast pain or discharge from the nipple. she did miss her mammogram last year.   Other Problems ([place] RMA; 10/02/2014 9:10 AM) Anxiety Disorder Arthritis Back Pain Bladder Problems Chest pain Colon Cancer Depression Gastroesophageal Reflux Disease Hepatitis High blood pressure Hypercholesterolemia Lump In Breast Migraine Headache Oophorectomy Right. Thyroid Disease  Past Surgical History (Anderson [place] RMA; 10/02/2014 9:10 AM) Appendectomy Breast Biopsy Right. Cesarean Section - Multiple Colon Polyp Removal - Open Colon Removal - Partial Hysterectomy (not due to cancer) - Partial Knee Surgery Right. Mammoplasty; Reduction Left. Oral Surgery Resection of Small Bowel  Diagnostic Studies History (Anderson MaltaWHeron RUtah 10/02/2014 9:10 AM) Colonoscopy 1-5 years ago Mammogram within last year Pap Smear 1-5 years ago  Social History (Anderson [place] RMA; 10/02/2014 9:10 AM) Alcohol use Occasional alcohol use. Caffeine use Carbonated beverages, Coffee. No drug use Tobacco use Never smoker.  Family History ([place] 10/02/2014 9:10 AM) Anesthetic complications  Mother. Arthritis Brother, Mother. Breast Cancer Family Members In General. Cerebrovascular Accident Brother. Colon Cancer Brother, Family Members In General. Diabetes Mellitus Brother, Family Members In [place] Father, Mother. Heart disease in female family member before age 51442Hypertension Brother, Mother, Sister. Ischemic Bowel Disease Brother, Family Members In General. Prostate Cancer Brother, Father. Respiratory Condition Family Members In General. Seizure disorder Sister. Thyroid problems Brother, Mother.  Pregnancy / Birth History ([place] 10/02/2014 9:10 AM) Age at menarche 89years. Age of menopause 51-55 Contraceptive History Oral contraceptives. Gravida 2 Irregular periods Maternal age 66-35Para 2 Regular periods  Review of Systems (Anderson MaltaW[place] RMA; 10/02/2014 9:10 AM) General Not Present- Appetite Loss, Chills, Fatigue, Fever, Night Sweats, Weight Gain and Weight Loss. Skin Not Present- Change in Wart/Mole, Dryness, Hives, Jaundice, New Lesions, Non-Healing Wounds, Rash and Ulcer. HEENT Present- Wears glasses/contact lenses. Not Present- Earache, Hearing Loss, Hoarseness, Nose Bleed, Oral Ulcers, Ringing in the Ears, Seasonal Allergies, Sinus Pain, Sore Throat, Visual Disturbances and Yellow Eyes. Respiratory Not Present- Bloody sputum, Chronic Cough, Difficulty Breathing, Snoring and Wheezing. Breast Present- Breast Mass. Not Present- Breast Pain, Nipple Discharge and Skin Changes. Cardiovascular Present- Leg Cramps. Not Present- Chest Pain, Difficulty Breathing Lying Down, Palpitations, Rapid Heart Rate, Shortness of Breath and Swelling of Extremities. Gastrointestinal Present- Abdominal Pain and Constipation. Not Present- Bloating, Bloody Stool, Change in Bowel Habits, Chronic diarrhea, Difficulty Swallowing, Excessive gas, Gets full quickly at meals, Hemorrhoids, Indigestion, Nausea, Rectal Pain and  Vomiting. Female Genitourinary Not Present- Frequency, Nocturia, Painful Urination, Pelvic Pain and Urgency. Musculoskeletal Present- Back Pain, Joint Pain and Muscle Pain. Not Present- Joint Stiffness, Muscle Weakness and Swelling of Extremities. Neurological Not Present- Decreased Memory, Fainting, Headaches, Numbness, Seizures, Tingling, Tremor, Trouble walking and  Weakness. Psychiatric Present- Anxiety and Change in Sleep Pattern. Not Present- Bipolar, Depression, Fearful and Frequent crying. Endocrine Not Present- Cold Intolerance, Excessive Hunger, Hair Changes, Heat Intolerance, Hot flashes and New Diabetes. Hematology Present- Easy Bruising. Not Present- Excessive bleeding, Gland problems, HIV and Persistent Infections.   Physical Exam Jordan Dibbles S. Marlou Starks MD; 10/02/2014 3:31 PM) General Mental Status-Alert. General Appearance-Consistent with stated age. Hydration-Well hydrated. Voice-Normal.  Head and Neck Head-normocephalic, atraumatic with no lesions or palpable masses. Trachea-midline. Thyroid Gland Characteristics - normal size and consistency.  Eye Eyeball - Bilateral-Extraocular movements intact. Sclera/Conjunctiva - Bilateral-No scleral icterus.  Chest and Lung Exam Chest and lung exam reveals -quiet, even and easy respiratory effort with no use of accessory muscles and on auscultation, normal breath sounds, no adventitious sounds and normal vocal resonance. Inspection Chest Wall - Normal. Back - normal.  Breast Note: There is no palpable mass in either breast. There is no palpable axillary, supraclavicular, or cervical lymphadenopathy   Cardiovascular Cardiovascular examination reveals -normal heart sounds, regular rate and rhythm with no murmurs and normal pedal pulses bilaterally.  Abdomen Inspection Inspection of the abdomen reveals - No Hernias. Skin - Scar - no surgical scars. Palpation/Percussion Palpation and Percussion of the abdomen  reveal - Soft, Non Tender, No Rebound tenderness, No Rigidity (guarding) and No hepatosplenomegaly. Auscultation Auscultation of the abdomen reveals - Bowel sounds normal.  Neurologic Neurologic evaluation reveals -alert and oriented x 3 with no impairment of recent or remote memory. Mental Status-Normal.  Musculoskeletal Normal Exam - Left-Upper Extremity Strength Normal and Lower Extremity Strength Normal. Normal Exam - Right-Upper Extremity Strength Normal and Lower Extremity Strength Normal.  Lymphatic Head & Neck  General Head & Neck Lymphatics: Bilateral - Description - Normal. Axillary  General Axillary Region: Bilateral - Description - Normal. Tenderness - Non Tender. Femoral & Inguinal  Generalized Femoral & Inguinal Lymphatics: Bilateral - Description - Normal. Tenderness - Non Tender.    Assessment & Plan Jordan Dibbles S. Marlou Starks MD; 10/02/2014 3:34 PM) PRIMARY CANCER OF LOWER-INNER QUADRANT OF RIGHT FEMALE BREAST (174.3  C50.311) Impression: The patient appears to have a small stage I cancer of the right breast. I have talked to her in detail about the different options for treatment and at this point she favors breast conservatiion. I think this is a reasonable choice to treat her cancer. I have discussed with her in detail the risks and benefits of the surgery as well as some of the technical aspects and she understands and wishes to proceed. I will plan for a right breast radioactive seed localized lumpectomy and sentinel node biopsy     Signed by Luella Cook, MD (10/02/2014 3:34 PM)

## 2014-10-17 NOTE — Transfer of Care (Signed)
Immediate Anesthesia Transfer of Care Note  Patient: Jordan [name]  Procedure(s) Performed: Procedure(s): RADIOACTIVE SEED GUIDED PARTIAL MASTECTOMY WITH AXILLARY SENTINEL LYMPH NODE BIOPSY (Right)  Patient Location: PACU  Anesthesia Type:General  Level of Consciousness: sedated  Airway & Oxygen Therapy: Patient Spontanous Breathing and Patient connected to face mask oxygen  Post-op Assessment: Report given to RN and Post -op Vital signs reviewed and stable  Post vital signs: Reviewed and stable  Last Vitals:  Filed Vitals:   10/17/14 1305  BP: 110/64  Pulse: 85  Temp:   Resp: 11    Complications: No apparent anesthesia complications

## 2014-10-17 NOTE — Anesthesia Preprocedure Evaluation (Addendum)
Anesthesia Evaluation  Patient identified by MRN, date of birth, ID band Patient awake    Reviewed: Allergy & Precautions, NPO status , Patient's Chart, lab work & pertinent test results  History of Anesthesia Complications (+) PONV  Airway Mallampati: III  TM Distance: >3 FB Neck ROM: Full    Dental  (+) Teeth Intact, Dental Advisory Given   Pulmonary neg pulmonary ROS,  breath sounds clear to auscultation        Cardiovascular hypertension, Pt. on home beta blockers Rhythm:Regular Rate:Normal     Neuro/Psych  Headaches, Depression    GI/Hepatic Neg liver ROS, GERD-  ,  Endo/Other  Hypothyroidism Morbid obesity  Renal/GU negative Renal ROS     Musculoskeletal   Abdominal   Peds  Hematology  (+) anemia ,   Anesthesia Other Findings   Reproductive/Obstetrics                            Anesthesia Physical Anesthesia Plan  ASA: III  Anesthesia Plan: General and Regional   Post-op Pain Management:    Induction: Intravenous  Airway Management Planned: LMA  Additional Equipment:   Intra-op Plan:   Post-operative Plan: Extubation in OR  Informed Consent: I have reviewed the patients History and Physical, chart, labs and discussed the procedure including the risks, benefits and alternatives for the proposed anesthesia with the patient or authorized representative who has indicated his/her understanding and acceptance.   Dental advisory given  Plan Discussed with: CRNA  Anesthesia Plan Comments:         Anesthesia Quick Evaluation

## 2014-10-17 NOTE — Discharge Instructions (Signed)

## 2014-10-17 NOTE — Anesthesia Procedure Notes (Addendum)
Anesthesia Regional Block:  Pectoralis block  Pre-Anesthetic Checklist: ,, timeout performed, Correct Patient, Correct Site, Correct Laterality, Correct Procedure, Correct Position, site marked, Risks and benefits discussed,  Surgical consent,  Pre-op evaluation,  At surgeon's request and post-op pain management  Laterality: Right  Prep: chloraprep       Needles:  Injection technique: Single-shot  Needle Type: Echogenic Needle     Needle Length: 9cm 9 cm Needle Gauge: 21 and 21 G    Additional Needles:  Procedures: ultrasound guided (picture in chart) Pectoralis block Narrative:  Start time: 10/17/2014 12:45 PM End time: 10/17/2014 12:55 PM  Performed by: Personally  Anesthesiologist: Suzette Battiest E  Additional Notes: Risks and benefits explained. Pt tolerated procedure without immediate complications.   Procedure Name: LMA Insertion Performed by: Terrance Mass Pre-anesthesia Checklist: Patient identified, Timeout performed, Emergency Drugs available, Suction available and Patient being monitored Patient Re-evaluated:Patient Re-evaluated prior to inductionOxygen Delivery Method: Circle system utilized Preoxygenation: Pre-oxygenation with 100% oxygen Intubation Type: IV induction Ventilation: Mask ventilation without difficulty LMA: LMA with gastric port inserted LMA Size: 4.0 Number of attempts: 1 Placement Confirmation: positive ETCO2 and breath sounds checked- equal and bilateral Tube secured with: Tape Dental Injury: Teeth and Oropharynx as per pre-operative assessment

## 2014-10-17 NOTE — Op Note (Signed)
10/17/2014  3:16 PM  PATIENT:  Jordan [name]  64 y.o. female  PRE-OPERATIVE DIAGNOSIS:  Right Breast Cancer  POST-OPERATIVE DIAGNOSIS:  Right Breast Cancer  PROCEDURE:  Procedure(s): RADIOACTIVE SEED GUIDED PARTIAL MASTECTOMY WITH AXILLARY SENTINEL LYMPH NODE BIOPSY (Right)  SURGEON:  Surgeon(s) and Role:    * Jovita Kussmaul, MD - Primary  PHYSICIAN ASSISTANT:   ASSISTANTS: none   ANESTHESIA:   general  EBL:  Total I/O In: 1500 [I.V.:1500] Out: -   BLOOD ADMINISTERED:none  DRAINS: none   LOCAL MEDICATIONS USED:  MARCAINE     SPECIMEN:  Source of Specimen:  right breast tissue and sentinel node X 2 with additional superior margin  DISPOSITION OF SPECIMEN:  PATHOLOGY  COUNTS:  YES  TOURNIQUET:  * No tourniquets in log *  DICTATION: .Dragon Dictation  After informed consent was obtained the patient was brought to the operating room and placed in the supine position on the operating room table. After adequate induction of general anesthesia the patient's right chest, breast, and axillary area were prepped with ChloraPrep, allowed to dry, and draped in the usual sterile manner. Previously the patient had a I-125 radioactive seed placed in the lower inner quadrant of the right breast to mark an area of breast cancer. Earlier in the day the patient also underwent injection of 1 mCi of technetium sulfur colloid in the subareolar position on the right. At this point the neoprobe was set to technetium. A hot spot was identified in the right axilla. A small transverse incision was made with a 15 blade knife overlying the hot spot with a 15 blade knife. This incision was carried through the skin and subcutaneous tissue sharply with electrocautery until the axilla was entered. The neoprobe was used to direct blunt hemostat dissection until 2 hot lymph nodes were identified. Each lymph node was excised sharply with the electrocautery. The ex vivo counts on the first lymph node were around  800. The ex vivo counts on the second lymph node were approximately 400. These were sent as sentinel nodes #1 and 2. No other hot or palpable lymph nodes were identified in the right axilla. Hemostasis was achieved using the Bovie electrocautery. The wound was infiltrated with quarter percent Marcaine. The deep layer of the wound was closed with interrupted 3-0 Vicryl stitches. The skin was then closed with a running 4-0 Monocryl subcuticular stitch. Attention was then turned to the right breast. The neoprobe was then set to I-125 in the area of radioactivity in the medial right breast was identified. A transversely oriented incision was made overlying the radioactivity with a 15 blade knife. This incision was carried through the skin and subcutaneous tissue sharply with electrocautery. Using the neoprobe to frequently check the position of the radioactive seed a circular portion of breast tissue was excised sharply with the electrocautery around the area of radioactivity. Once the specimen was removed it was oriented with the appropriate paint colors. The radioactivity was confirmed in the specimen. There was no longer any radioactivity identified in the right breast. A specimen radiograph was obtained that showed the clip in seed to be in the specimen. The specimen was then sent to pathology for further evaluation. An additional superior margin was taken sharply with the electrocautery and marked with a short double stitch on the new superior margin, a short single stitch on the anterior margin, and a long double stitch on the lateral margin. This was also sent to pathology for further evaluation. Hemostasis  was achieved using the Bovie electrocautery. The wound was irrigated with copious amounts of saline and infiltrated with quarter percent Marcaine. The deep layer of the wound was then closed with interrupted 3-0 Vicryl stitches. The skin was then closed with interrupted 4-0 Monocryl subcuticular stitches.  Dermabond dressings were applied. The patient tolerated the procedure well. At the end of the case all needle sponge and instrument counts were correct. The patient was then awakened and taken to recovery in stable condition.  PLAN OF CARE: Discharge to home after PACU  PATIENT DISPOSITION:  PACU - hemodynamically stable.   Delay start of Pharmacological VTE agent (>24hrs) due to surgical blood loss or risk of bleeding: not applicable

## 2014-10-17 NOTE — Interval H&P Note (Signed)
History and Physical Interval Note:  10/17/2014 10:18 AM  Jordan [name]  has presented today for surgery, with the diagnosis of Right Breast Cancer  The various methods of treatment have been discussed with the patient and family. After consideration of risks, benefits and other options for treatment, the patient has consented to  Procedure(s): RADIOACTIVE SEED GUIDED PARTIAL MASTECTOMY WITH AXILLARY SENTINEL LYMPH NODE BIOPSY (Right) as a surgical intervention .  The patient's history has been reviewed, patient examined, no change in status, stable for surgery.  I have reviewed the patient's chart and labs.  Questions were answered to the patient's satisfaction.     TOTH III,Sonja Manseau S

## 2014-10-18 ENCOUNTER — Encounter (HOSPITAL_BASED_OUTPATIENT_CLINIC_OR_DEPARTMENT_OTHER): Payer: Self-pay | Admitting: General Surgery

## 2014-10-18 NOTE — Anesthesia Postprocedure Evaluation (Signed)
  Anesthesia Post-op Note  Patient: Jordan [name]  Procedure(s) Performed: Procedure(s): RADIOACTIVE SEED GUIDED PARTIAL MASTECTOMY WITH AXILLARY SENTINEL LYMPH NODE BIOPSY (Right)  Patient Location: PACU  Anesthesia Type:GA combined with regional for post-op pain  Level of Consciousness: awake and alert   Airway and Oxygen Therapy: Patient Spontanous Breathing  Post-op Pain: none  Post-op Assessment: Post-op Vital signs reviewed  Post-op Vital Signs: Reviewed  Last Vitals:  Filed Vitals:   10/17/14 1640  BP: 128/77  Pulse: 71  Temp: 36.7 C  Resp: 18    Complications: No apparent anesthesia complications

## 2014-10-22 ENCOUNTER — Encounter: Payer: Self-pay | Admitting: Genetic Counselor

## 2014-10-22 DIAGNOSIS — C50311 Malignant neoplasm of lower-inner quadrant of right female breast: Secondary | ICD-10-CM

## 2014-10-22 DIAGNOSIS — C19 Malignant neoplasm of rectosigmoid junction: Secondary | ICD-10-CM

## 2014-10-22 NOTE — Progress Notes (Signed)
[facility]  Genetic Test Results    REFERRING PROVIDER: Dr. Lurline Del  PRIMARY PROVIDER:  JEFFERY,CHELLE, PA-C  PRIMARY REASON FOR VISIT:  Personal history of breast and colorectal cancer  GENETIC TEST RESULT:  Testing Laboratory: [facility]  Test Ordered: OvaNext gene panel Date of Report: 10/22/2014 Result: Normal, no pathogenic mutations identified Other: Two variants of uncertain significance: 1) ATM, p.L2330V and 2) SMARCA4, p.L2751Z General Interpretation: Reassuring  HPI: Jordan [name] was previously seen in the [facility] due to concerns regarding a hereditary predisposition to cancer. Please refer to our prior cancer genetics clinic note for more information regarding Jordan [name]'s medical, social and family histories, and our assessment and recommendations, at the time. Jordan [name] genetic test results and recommendations warranted by these results were recently disclosed to her and are discussed in more detail below.  GENETIC TEST RESULTS: At the time of Jordan [name]'s visit, we recommended she pursue genetic testing, which includes sequencing and deletion/duplication analysis of several genes associated with an increased risk for cancer via a gene panel. The OvaNext gene panel offered by [employer] includes sequencing and rearrangement analysis for the following 24 genes:ATM, BARD1, BRCA1, BRCA2, BRIP1, CDH1, CHEK2, EPCAM, MLH1, MRE11A, MSH2, MSH6, MUTYH, NBN, NF1, PALB2, PMS2, PTEN, RAD50, RAD51C, RAD51D, SMARCA4, STK11, and TP53. Genetic testing for this gene panel was normal and did not reveal a pathogenic mutation in any of these genes. A copy of the genetic test report will be scanned into Epic under the media tab.   Genetic testing did identify two variants of uncertain significance called 1) ATM, p.L2330V and 2) SMARCA4, p.G0174B. At this time, it is unknown if these variants are associated  with an increased risk for cancer or if this is a normal finding. With time, we suspect the lab will reclassify this variant and when they do, we will try to re-contact Jordan [name] to discuss the reclassification further.   We discussed with Jordan [name] that current genetic testing is not perfect, and it is, therefore, possible there may be a pathogenic gene mutation in one of these genes that current testing cannot detect, but that chance is small.  We also discussed, that it is possible that another gene that has not yet been discovered, or that we have not yet tested, is responsible for the cancer diagnoses in her family. It is, therefore, important for Jordan [name] to continue to remain in touch with cancer genetics so that we can continue to offer Jordan [name] the most up to date genetic testing.   CANCER SCREENING RECOMMENDATIONS: This result is reassuring and indicates that Jordan [name] likely does not have an increased risk for a future cancer due to a mutation in one of these genes. This normal test also suggests that Jordan [name]'s cancer diagnoses were most likely not due to an inherited predisposition associated with one of these genes.  Most cancers happen by chance and this negative test suggests that her cancers fall into this category.  We, therefore, recommended she continue to follow the cancer management and screening guidelines provided by her oncology and primary healthcare providers.    RECOMMENDATIONS FOR FAMILY MEMBERS: Individuals in this family might be at some increased risk of developing cancer, over the general population risk, simply due to the family history of cancer.  We recommended women in this family have a yearly mammogram beginning at age 38, an annual clinical breast exam, and perform monthly  breast self-exams. Women in this family should also have a gynecological exam as recommended by their primary provider. All family members should have a colonoscopy by age 72, an annual  dermatological exam and continue to follow cancer screening guidelines recommended by their healthcare provider.  FOLLOW-UP: Lastly, we discussed with Jordan [name] that cancer genetics is a rapidly advancing field and it is likely that new genetic tests will be appropriate for her and/or family members in the future. We encouraged her to remain in contact with cancer genetics on an annual basis so we can update her personal and family histories and let her know of advances in cancer genetics that may benefit this family.   Our contact number was provided. [name] questions were answered to her satisfaction, and she knows she is welcome to call Jordan [name] at anytime with additional questions or concerns.    Catherine A. Fine, MS, CGC Certified Psychologist, [email or web address] Phone: [phone]

## 2014-10-29 ENCOUNTER — Ambulatory Visit (HOSPITAL_BASED_OUTPATIENT_CLINIC_OR_DEPARTMENT_OTHER): Payer: Medicare PPO | Admitting: Oncology

## 2014-10-29 ENCOUNTER — Telehealth: Payer: Self-pay | Admitting: Oncology

## 2014-10-29 VITALS — BP 109/73 | HR 79 | Temp 98.5°F | Resp 18 | Ht 61.0 in | Wt 229.3 lb

## 2014-10-29 DIAGNOSIS — C50811 Malignant neoplasm of overlapping sites of right female breast: Secondary | ICD-10-CM

## 2014-10-29 DIAGNOSIS — C19 Malignant neoplasm of rectosigmoid junction: Secondary | ICD-10-CM

## 2014-10-29 DIAGNOSIS — C50311 Malignant neoplasm of lower-inner quadrant of right female breast: Secondary | ICD-10-CM

## 2014-10-29 DIAGNOSIS — Z171 Estrogen receptor negative status [ER-]: Secondary | ICD-10-CM

## 2014-10-29 DIAGNOSIS — D509 Iron deficiency anemia, unspecified: Secondary | ICD-10-CM

## 2014-10-29 NOTE — Progress Notes (Signed)
[name]  Telephone:([phone]) [phone] [phone]     ID: Jordan [name] DOB: Jul 25, 1951  MR#: [id number]  ZLD#:[id number]  Patient Care Team: Fara Chute, PA-C as PCP - General (Physician Assistant) [name], MD as Consulting Physician (General Surgery) Chauncey Cruel, MD as Consulting Physician (Oncology) Arloa Koh, MD as Consulting Physician (Radiation Oncology) Mauro Kaufmann, RN as Registered Nurse Rockwell Germany, RN as Registered Nurse Holley Bouche, NP as Nurse Practitioner (Nurse Practitioner) PCP: Harrison Mons, PA-C GYN: SU:  OTHER MD: [name] MD  CHIEF COMPLAINT: Early stage triple negative breast cancer  CURRENT TREATMENT: Considering adjuvant chemotherapy   BREAST CANCER HISTORY: From the original intake note:  "Jordan [name]" [facility] 2015 showing breast density category B. A possible mass in the right breast was noted and on Jerry 20 03/05/2015 the patient underwent right diagnostic mammography with right ultrasonography. Spot compression views confirmed a slightly irregular mass in the lower inner quadrant of the right breast. This was not palpable by physical exam. Ultrasound showed a small hypoechoic mass measuring 5 mm in the area in question. Ultrasound of the right axilla was unremarkable.  Biopsy of the mass in question fibrin 05/06/2015 showed ([id number]) and invasive ductal carcinoma, grade 2, estrogen and progesterone receptor negative, with an MIB-1 of 41%, and no HER-2 amplification.  Bilateral breast MRI was obtained 10/04/2014. Results are pending.  The patient's subsequent history is as detailed below   INTERVAL HISTORY: Jordan [name] returns today for follow-up of her right-sided triple negative breast cancer accompanied by her brother.. Since the last visit here she underwent right lumpectomy and sentinel lymph node sampling. This found a residual 0.6 cm  invasive ductal carcinoma, grade 3, again HER-2 negative, with a signals ratio 0.9 for an copy #1.70; and with both sentinel lymph nodes negative as well. Margins were clear.  REVIEW OF SYSTEMS: She has always had "tender breasts", and she still has significant tenderness at the surgical site. Bring in her right arm across her chest is very painful. There is been no fever or bleeding however. Aside from this issue, a detailed review of systems today was unchanged from baseline  PAST MEDICAL HISTORY: Past Medical History  Diagnosis Date  . Hyperlipidemia     takes Zetia and Zocor daily  . Thyroid disease   . Nasal congestion   . Leg swelling   . Constipation   . Nausea   . Generalized headaches     due to allergies, sinus  . PONV (postoperative nausea and vomiting)     pt states she is very easy to sedate  . Hypertension     takes Maxzide and Metoprolol daily  . Pneumonia     walking about 6-72yr ago  . History of bronchitis     last time about 6-7109yrago  . Hx of seasonal allergies     takes OTC allergy med nightly  . History of migraine     last one about 15+yrs ago  . Dizziness     r/t side effects from meds  . Vertigo     takes Meclizine prn  . Joint pain   . Back pain     buldging disc  . GERD (gastroesophageal reflux disease)     takes Protonix as needed  . Hemorrhoids   . History of colon polyps   . Mass of colon   . Cancer  colon  . History of UTI   . Hypothyroidism     takes Synthroid daily  . Depression     takes Wellbutrin daily  . Wears glasses     PAST SURGICAL HISTORY: Past Surgical History  Procedure Laterality Date  . Cesarean section  1968, 1970  . Knee surgery  1998    right - arthroscopic  . Abdominal hysterectomy    . Exploratory laparotomy    . Carpal tunnel release    . Colonoscopy    . Esophagogastroduodenoscopy    . Partial colectomy  03/30/2012    Procedure: PARTIAL COLECTOMY;  Surgeon: [name], MD;  Location: [facility];   Service: General;  Laterality: N/A;  . Appendectomy    . Radioactive seed guided mastectomy with axillary sentinel lymph node biopsy Right 10/17/2014    Procedure: RADIOACTIVE SEED GUIDED PARTIAL MASTECTOMY WITH AXILLARY SENTINEL LYMPH NODE BIOPSY;  Surgeon: [name], MD;  Location: [facility];  Service: General;  Laterality: Right;    FAMILY HISTORY Family History  Problem Relation Age of Onset  . Cancer Father 29    prostate  . Cancer Brother 77    colon  . Deep vein thrombosis Brother   . Cancer Maternal Aunt 53    breast  . Cancer Maternal Grandmother 39    breast  . Cancer Paternal Grandmother     colon  . Pulmonary embolism Brother     long-distance truck driver (PE x 2)  . Cancer Brother 55    colon  . Diabetes Maternal Grandfather   . Cancer Maternal Aunt 75    unknown type  . Mental illness Sister     history of Depression  . Seizures Sister 8    s/p traumatic brain injury  . Cancer Maternal Uncle     prostate   the patient's parents are still living, as of February 2016. Her father is 35 years old, with a history of prostate cancer diagnosed in his late 33s. The patient's mother is 33 years old. The patient had 5 brothers, 3 sisters. One brother died with prostate cancer at age 17. Another brother has a history of prostate cancer, age 39. One brother had colon cancer diagnosed age 67 as did a paternal grandmother. A maternal grandmother had breast cancer at age 67 as did a maternal aunt (diagnosed age 50.) A cousin was diagnosed with breast cancer the age of 63.  GYNECOLOGIC HISTORY:  No LMP recorded. Patient has had a hysterectomy. Menarche age 40, first live birth age 48. The patient is GX P1. She stopped having periods in 1981 when she underwent hysterectomy and unilateral salpingo-oophorectomy.. She did not take hormone replacement.  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 Her husband Jordan [name] owns a business that [employer] at  night. The patient's biological son Jordan [name] works in Biomedical scientist in [place]. He has 2 children. One of them, the patient's granddaughter Jordan [name], 63 years old, lives with the patient, and is a Interior and spatial designer at [employer]. The patient also has an adopted son, and 1, lives in [place]. Jordan [name] attends a city of [employer] church in [place] where she grew up    ADVANCED DIRECTIVES: Not in place  HEALTH MAINTENANCE: History  Substance Use Topics  . Smoking status: Never Smoker   . Smokeless tobacco: Never Used  . Alcohol Use: No     Colonoscopy: 03/30/2013/[facility]  PAP:  Bone density:  Lipid panel:  No Known  Allergies  Current Outpatient Prescriptions  Medication Sig Dispense Refill  . aspirin 81 MG tablet Take 81 mg by mouth daily.    Marland Kitchen buPROPion (WELLBUTRIN XL) 150 MG 24 hr tablet Take 1 tablet (150 mg total) by mouth daily. 30 tablet 5  . ezetimibe (ZETIA) 10 MG tablet Take 1 tablet (10 mg total) by mouth daily. 30 tablet 5  . levothyroxine (SYNTHROID, LEVOTHROID) 125 MCG tablet TAKE 1 TABLET BY MOUTH DAILY 90 tablet 1  . meclizine (ANTIVERT) 25 MG tablet Take 1 tablet (25 mg total) by mouth 4 (four) times daily as needed for dizziness. 30 tablet 1  . meloxicam (MOBIC) 15 MG tablet Take 1 tablet (15 mg total) by mouth daily. 30 tablet 1  . metoprolol succinate (TOPROL-XL) 25 MG 24 hr tablet Take 1 tablet (25 mg total) by mouth daily. 30 tablet 5  . nitroGLYCERIN (NITROSTAT) 0.4 MG SL tablet Place 1 tablet (0.4 mg total) under the tongue every 5 (five) minutes as needed for chest pain. 30 tablet 1  . oxyCODONE-acetaminophen (ROXICET) 5-325 MG per tablet Take 1-2 tablets by mouth every 4 (four) hours as needed. 50 tablet 0  . Prenatal Multivit-Min-Fe-FA (PRENATAL VITAMINS PO) Take by mouth.    . simvastatin (ZOCOR) 40 MG tablet Take 1 tablet (40 mg total) by mouth at bedtime. 30 tablet 5  . triamterene-hydrochlorothiazide (MAXZIDE-25) 37.5-25 MG per tablet TAKE 1  TABLET DAILY 30 tablet 12   No current facility-administered medications for this visit.   Facility-Administered Medications Ordered in Other Visits  Medication Dose Route Frequency Provider Last Rate Last Dose  . chlorhexidine (HIBICLENS) 4 % liquid 1 application  1 application Topical Once [name], MD      . chlorhexidine (HIBICLENS) 4 % liquid 1 application  1 application Topical Once [name], MD        OBJECTIVE: Middle-aged African-American woman who walks with a cane Filed Vitals:   10/29/14 0831  BP: 109/73  Pulse: 79  Temp: 98.5 F (36.9 C)  Resp: 18     Body mass index is 43.35 kg/(m^2).    ECOG FS:1 - Symptomatic but completely ambulatory  Sclerae unicteric, EOMs intact Oropharynx clear and moist No cervical or supraclavicular adenopathy Lungs no rales or rhonchi Heart regular rate and rhythm Abd soft, obese, nontender, positive bowel sounds MSK no focal spinal tenderness, no upper extremity lymphedema Neuro: nonfocal, well oriented, appropriate affect Breasts: The right breast is status post recent lumpectomy. The incisions are healing nicely. There is no subjacent induration. There is no erythema or dehiscence. The cosmetic result is good. The right axilla is benign per the left breast is unremarkable.    LAB RESULTS:  CMP     Component Value Date/Time   NA 145 10/02/2014 1147   NA 142 08/20/2014 1238   K 3.5 10/02/2014 1147   K 4.1 08/20/2014 1238   CL 104 08/20/2014 1238   CO2 27 10/02/2014 1147   CO2 29 08/20/2014 1238   GLUCOSE 96 10/02/2014 1147   GLUCOSE 85 08/20/2014 1238   BUN 13.2 10/02/2014 1147   BUN 13 08/20/2014 1238   CREATININE 1.0 10/02/2014 1147   CREATININE 1.04 08/20/2014 1238   CREATININE 0.87 03/31/2012 0611   CALCIUM 8.8 10/02/2014 1147   CALCIUM 9.0 08/20/2014 1238   PROT 6.5 10/02/2014 1147   PROT 6.9 08/20/2014 1238   ALBUMIN 3.3* 10/02/2014 1147   ALBUMIN 3.9 08/20/2014 1238   AST 26 10/02/2014 1147  AST 23  08/20/2014 1238   ALT 28 10/02/2014 1147   ALT 24 08/20/2014 1238   ALKPHOS 52 10/02/2014 1147   ALKPHOS 54 08/20/2014 1238   BILITOT 0.52 10/02/2014 1147   BILITOT 0.6 08/20/2014 1238   GFRNONAA 69* 03/31/2012 0611   GFRAA 80* 03/31/2012 0611    INo results found for: SPEP, UPEP  Lab Results  Component Value Date   WBC 9.8 10/02/2014   NEUTROABS 5.8 10/02/2014   HGB 15.2* 10/17/2014   HCT 44.5 10/02/2014   MCV 88.8 10/02/2014   PLT 246 10/02/2014      Chemistry      Component Value Date/Time   NA 145 10/02/2014 1147   NA 142 08/20/2014 1238   K 3.5 10/02/2014 1147   K 4.1 08/20/2014 1238   CL 104 08/20/2014 1238   CO2 27 10/02/2014 1147   CO2 29 08/20/2014 1238   BUN 13.2 10/02/2014 1147   BUN 13 08/20/2014 1238   CREATININE 1.0 10/02/2014 1147   CREATININE 1.04 08/20/2014 1238   CREATININE 0.87 03/31/2012 0611      Component Value Date/Time   CALCIUM 8.8 10/02/2014 1147   CALCIUM 9.0 08/20/2014 1238   ALKPHOS 52 10/02/2014 1147   ALKPHOS 54 08/20/2014 1238   AST 26 10/02/2014 1147   AST 23 08/20/2014 1238   ALT 28 10/02/2014 1147   ALT 24 08/20/2014 1238   BILITOT 0.52 10/02/2014 1147   BILITOT 0.6 08/20/2014 1238       No results found for: LABCA2  No components found for: LABCA125  No results for input(s): INR in the last 168 hours.  Urinalysis    Component Value Date/Time   COLORURINE YELLOW 03/09/2011 0857   APPEARANCEUR CLOUDY* 03/09/2011 0857   LABSPEC 1.022 03/09/2011 0857   PHURINE 5.5 03/09/2011 0857   GLUCOSEU NEGATIVE 03/09/2011 0857   HGBUR NEGATIVE 03/09/2011 0857   BILIRUBINUR SMALL* 03/09/2011 0857   KETONESUR NEGATIVE 03/09/2011 0857   PROTEINUR NEGATIVE 03/09/2011 0857   UROBILINOGEN 0.2 03/09/2011 0857   NITRITE NEGATIVE 03/09/2011 0857   LEUKOCYTESUR LARGE* 03/09/2011 0857    STUDIES: Mr Breast Bilateral W Wo Contrast  10/04/2014   CLINICAL DATA:  Right lower inner quadrant recently diagnosed invasive ductal  carcinoma.  LABS:  Not obtained  EXAM: BILATERAL BREAST MRI WITH AND WITHOUT CONTRAST  TECHNIQUE: Multiplanar, multisequence MR images of both breasts were obtained prior to and following the intravenous administration of 20 ml of Multihance.  THREE-DIMENSIONAL MR IMAGE RENDERING ON INDEPENDENT WORKSTATION:  Three-dimensional MR images were rendered by post-processing of the original MR data on an independent workstation. The three-dimensional MR images were interpreted, and findings are reported in the following complete MRI report for this study. Three dimensional images were evaluated at the independent DynaCad workstation  COMPARISON:  Previous exam(s).  FINDINGS: Breast composition: a.  Almost entirely fat.  Background parenchymal enhancement: Mild  Right breast: No abnormal enhancement is identified separate from smooth rim enhancement of biopsy cavity anterior to right lower inner quadrant clip artifact from previous biopsy proven breast cancer.  Left breast: No mass or abnormal enhancement.  Lymph nodes: No abnormal appearing lymph nodes.  Ancillary findings:  None.  IMPRESSION: Post biopsy change, right breast lower inner quadrant, adjacent clip artifact at the location of biopsy proven breast cancer. No MRI evidence for multifocal/ multicentric or contralateral malignancy.  RECOMMENDATION: Treatment plan  BI-RADS CATEGORY  6: Known biopsy-proven malignancy.   Electronically Signed   By: Elzie Rings  Green M.D.   On: 10/04/2014 15:55   Nm Sentinel Node Inj-no Rpt (breast)  10/17/2014   CLINICAL DATA: right breast cancer   Sulfur colloid was injected intradermally by the nuclear medicine  technologist for breast cancer sentinel node localization.    Mm Breast Surgical Specimen  10/17/2014   CLINICAL DATA:  Status post right lumpectomy.  EXAM: SPECIMEN RADIOGRAPH OF THE RIGHT BREAST  COMPARISON:  Previous exam(s).  FINDINGS: Status post excision of the right breast. The radioactive seed and biopsy marker  clip are present, completely intact, and are marked for pathology. These findings were discussed by telephone only patient was in the operating room.  IMPRESSION: Specimen radiograph of the right breast.   Electronically Signed   By: Andres Shad   On: 10/17/2014 15:21   Mm Rt Radioactive Seed Loc Mammo Guide  10/16/2014   CLINICAL DATA:  Recently diagnosed right breast invasive ductal carcinoma.  EXAM: MAMMOGRAPHIC GUIDED RADIOACTIVE SEED LOCALIZATION OF THE RIGHT BREAST  COMPARISON:  Previous exam(s).  FINDINGS: Patient presents for radioactive seed localization prior to right lumpectomy. I met with the patient and we discussed the procedure of seed localization including benefits and alternatives. We discussed the high likelihood of a successful procedure. We discussed the risks of the procedure including infection, bleeding, tissue injury and further surgery. We discussed the low dose of radioactivity involved in the procedure. Informed, written consent was given.  The usual time-out protocol was performed immediately prior to the procedure.  Using mammographic guidance, sterile technique, 2% lidocaine and an I-125 radioactive seed, the recently placed coil shaped marker clip in the lower inner right breast was localized using a medial approach. The follow-up mammogram images confirm the seed in the expected location and are marked for Dr. Marlou Starks.  Follow-up survey of the patient confirms presence of the radioactive seed.  Order number of I-125 seed:  [id number].  Total activity:  0.250 mCi  Reference Date: 10/04/2014  The patient tolerated the procedure well and was released from the [facility]. She was given instructions regarding seed removal.  IMPRESSION: Radioactive seed localization right breast. No apparent complications.   Electronically Signed   By: Claudie Revering M.D.   On: 10/16/2014 11:03    ASSESSMENT: 64 y.o.  [place] woman status post right breast lower inner quadrant biopsy 09/24/2014 for  a clinical T1a N0, stage IA invasive ductal carcinoma, grade 1 or 2, estrogen and progesterone receptor negative, with an MIB-1 of 41%, and HER-2 negative   (1) status post right lumpectomy and axillary sentinel lymph node sampling 10/17/2014 for a pT1b pN0, s invasive ductal carcinoma, grade 3, repeat HER-2 again negative.tage IA    (2) the patient is considering adjuvant chemotherapy    (3) the patient will benefit from radiation after the completion of  (possible) chemotherapy   (4) status post partial right colectomy with lymphadenectomy 03/30/2012 for a 2.7 cm tubular adenoma with high-grade dysplasia, no evidence of invasion, 0 of 11 lymph nodes involved ([id number])  (5) genetics testing sent 10/04/2014: the OvaNext gene panel offered by [employer] includes sequencing and rearrangement analysis for the following 24 genes:ATM, BARD1, BRCA1, BRCA2, BRIP1, CDH1, CHEK2, EPCAM, MLH1, MRE11A, MSH2, MSH6, MUTYH, NBN, NF1, PALB2, PMS2, PTEN, RAD50, RAD51C, RAD51D, SMARCA4, STK11, and TP53-- results pending  PLAN: I spent approximately 45 minutes with the patient and her brother going over her situation. She does have a very small tumor, and it is lymph node negative. That is favorable. On  the other hand it is a grade 3 triple-negative breast cancer.  We discussed the difference between local and systemic treatments she understands with surgery and radiation she will have a very good chance of this cancer not recurring in her breast. The chance of it recurring anywhere with local treatment only would probably be in her case, taking account of comorbid conditions, between 15 and 20%. Since chemotherapy generally lowers risk by about a third, I quoted her a benefit of between 5 and 7% with 4 doses of cyclophosphamide and docetaxel.  This means that if I had 100 women like her and all 100 received chemotherapy, 5 or so would benefit from the chemotherapy. 80 would already be "cured" with local  treatment", and the rest would have the cancer recur despite receiving chemotherapy.  We then discussed the possible toxicities, side effects and complications of these chemotherapy agents including the risk of infection, low blood counts, and peripheral nerve damage.  Gilda was not able to make a decision today. I think it would be useful if she came to "chemotherapy school" and had a tumor of the treatment area and that is being set up. She tells me she will probably call us within a week with a definite yes or no. She understands if she will receive chemotherapy she will need to have a port. Also, with triple negative cases we do like to start within a month of definitive surgery.  If the decision is no, I will have her see Dr. Valere Dross to proceed with radiation and then she will see me afterwards to initiate long-term follow-up.  Itha has a good understanding of what is involved. All the information was given to her in writing. As soon as we know her decision we will operationalized.  Chauncey Cruel, MD   10/29/2014 8:39 AM Medical Oncology and Hematology [facility] [address] [place], [place] [address] Tel. [phone]    Fax. [phone]

## 2014-11-06 ENCOUNTER — Encounter: Payer: Self-pay | Admitting: *Deleted

## 2014-11-06 ENCOUNTER — Other Ambulatory Visit: Payer: Medicare PPO

## 2014-11-06 ENCOUNTER — Telehealth: Payer: Self-pay | Admitting: *Deleted

## 2014-11-06 NOTE — Telephone Encounter (Signed)
Patient called and stated, "tell Dr. Jana Hakim that I have decided to do chemotherapy. Thank you."

## 2014-11-08 ENCOUNTER — Other Ambulatory Visit: Payer: Self-pay | Admitting: Oncology

## 2014-11-10 ENCOUNTER — Other Ambulatory Visit: Payer: Self-pay | Admitting: Oncology

## 2014-11-11 ENCOUNTER — Telehealth: Payer: Self-pay | Admitting: Oncology

## 2014-11-11 ENCOUNTER — Other Ambulatory Visit: Payer: Self-pay | Admitting: Surgery

## 2014-11-11 DIAGNOSIS — C50911 Malignant neoplasm of unspecified site of right female breast: Secondary | ICD-10-CM

## 2014-11-11 DIAGNOSIS — I878 Other specified disorders of veins: Secondary | ICD-10-CM

## 2014-11-11 NOTE — Telephone Encounter (Signed)
This RN reviewed note- pt had surgery on 10/17/2014 and attended chemo education last week.  This note  will be reviewed with MD for appropriate scheduling of chemo dates and follow up.

## 2014-11-11 NOTE — Telephone Encounter (Signed)
Called patient on home/cell re 4/1 appointment. Not able to reach patient or leave message on either phone. Schedule mailed.

## 2014-11-13 ENCOUNTER — Other Ambulatory Visit: Payer: Self-pay | Admitting: Radiology

## 2014-11-14 ENCOUNTER — Ambulatory Visit: Payer: Medicare HMO | Admitting: Nurse Practitioner

## 2014-11-14 ENCOUNTER — Ambulatory Visit (HOSPITAL_COMMUNITY)
Admission: RE | Admit: 2014-11-14 | Discharge: 2014-11-14 | Disposition: A | Discharge status: Home / Self Care | Payer: Medicare PPO | Source: Ambulatory Visit | Attending: Interventional Radiology | Admitting: Interventional Radiology

## 2014-11-14 ENCOUNTER — Encounter (HOSPITAL_COMMUNITY): Payer: Self-pay

## 2014-11-14 ENCOUNTER — Telehealth: Payer: Self-pay | Admitting: *Deleted

## 2014-11-14 VITALS — BP 107/64 | HR 73 | Temp 97.7°F | Resp 16 | Ht 61.0 in | Wt 232.0 lb

## 2014-11-14 DIAGNOSIS — Z85038 Personal history of other malignant neoplasm of large intestine: Secondary | ICD-10-CM | POA: Insufficient documentation

## 2014-11-14 DIAGNOSIS — K219 Gastro-esophageal reflux disease without esophagitis: Secondary | ICD-10-CM | POA: Diagnosis not present

## 2014-11-14 DIAGNOSIS — E785 Hyperlipidemia, unspecified: Secondary | ICD-10-CM | POA: Diagnosis not present

## 2014-11-14 DIAGNOSIS — I10 Essential (primary) hypertension: Secondary | ICD-10-CM | POA: Diagnosis not present

## 2014-11-14 DIAGNOSIS — Z7982 Long term (current) use of aspirin: Secondary | ICD-10-CM | POA: Diagnosis not present

## 2014-11-14 DIAGNOSIS — I878 Other specified disorders of veins: Secondary | ICD-10-CM

## 2014-11-14 DIAGNOSIS — M549 Dorsalgia, unspecified: Secondary | ICD-10-CM | POA: Diagnosis not present

## 2014-11-14 DIAGNOSIS — Z79899 Other long term (current) drug therapy: Secondary | ICD-10-CM | POA: Insufficient documentation

## 2014-11-14 DIAGNOSIS — R42 Dizziness and giddiness: Secondary | ICD-10-CM | POA: Diagnosis not present

## 2014-11-14 DIAGNOSIS — C50911 Malignant neoplasm of unspecified site of right female breast: Secondary | ICD-10-CM

## 2014-11-14 DIAGNOSIS — E039 Hypothyroidism, unspecified: Secondary | ICD-10-CM | POA: Insufficient documentation

## 2014-11-14 DIAGNOSIS — Z90710 Acquired absence of both cervix and uterus: Secondary | ICD-10-CM | POA: Diagnosis not present

## 2014-11-14 DIAGNOSIS — J302 Other seasonal allergic rhinitis: Secondary | ICD-10-CM | POA: Diagnosis not present

## 2014-11-14 DIAGNOSIS — F329 Major depressive disorder, single episode, unspecified: Secondary | ICD-10-CM | POA: Insufficient documentation

## 2014-11-14 DIAGNOSIS — Z9049 Acquired absence of other specified parts of digestive tract: Secondary | ICD-10-CM | POA: Insufficient documentation

## 2014-11-14 DIAGNOSIS — C50919 Malignant neoplasm of unspecified site of unspecified female breast: Secondary | ICD-10-CM | POA: Insufficient documentation

## 2014-11-14 LAB — CBC
HCT: 42.5 % (ref 36.0–46.0)
HEMOGLOBIN: 13.9 g/dL (ref 12.0–15.0)
MCH: 28.4 pg (ref 26.0–34.0)
MCHC: 32.7 g/dL (ref 30.0–36.0)
MCV: 86.7 fL (ref 78.0–100.0)
Platelets: 279 10*3/uL (ref 150–400)
RBC: 4.9 MIL/uL (ref 3.87–5.11)
RDW: 14.3 % (ref 11.5–15.5)
WBC: 8.5 10*3/uL (ref 4.0–10.5)

## 2014-11-14 LAB — BASIC METABOLIC PANEL
ANION GAP: 10 (ref 5–15)
BUN: 11 mg/dL (ref 6–23)
CHLORIDE: 107 mmol/L (ref 96–112)
CO2: 27 mmol/L (ref 19–32)
Calcium: 9 mg/dL (ref 8.4–10.5)
Creatinine, Ser: 1.1 mg/dL (ref 0.50–1.10)
GFR calc non Af Amer: 51 mL/min — ABNORMAL LOW (ref >90)
GFR, EST AFRICAN AMERICAN: 59 mL/min — AB (ref >90)
Glucose, Bld: 99 mg/dL (ref 70–99)
POTASSIUM: 3.3 mmol/L — AB (ref 3.5–5.1)
SODIUM: 144 mmol/L (ref 135–145)

## 2014-11-14 LAB — APTT: aPTT: 27 seconds (ref 24–37)

## 2014-11-14 LAB — PROTIME-INR
INR: 1.03 (ref 0.00–1.49)
PROTHROMBIN TIME: 13.6 s (ref 11.6–15.2)

## 2014-11-14 MED ORDER — HEPARIN SOD (PORK) LOCK FLUSH 100 UNIT/ML IV SOLN
INTRAVENOUS | Status: AC
  Filled 2014-11-14: qty 5

## 2014-11-14 MED ORDER — ONDANSETRON HCL 4 MG/2ML IJ SOLN
INTRAMUSCULAR | Status: AC
  Administered 2014-11-14: 4 mg via INTRAVENOUS
  Filled 2014-11-14: qty 2

## 2014-11-14 MED ORDER — SODIUM CHLORIDE 0.9 % IV SOLN
Freq: Once | INTRAVENOUS | Status: AC
  Administered 2014-11-14: 09:00:00 via INTRAVENOUS

## 2014-11-14 MED ORDER — CEFAZOLIN SODIUM-DEXTROSE 2-3 GM-% IV SOLR
2.0000 g | Freq: Once | INTRAVENOUS | Status: AC
  Administered 2014-11-14: 2 g via INTRAVENOUS

## 2014-11-14 MED ORDER — FENTANYL CITRATE 0.05 MG/ML IJ SOLN
INTRAMUSCULAR | Status: AC
  Filled 2014-11-14: qty 2

## 2014-11-14 MED ORDER — FENTANYL CITRATE 0.05 MG/ML IJ SOLN
INTRAMUSCULAR | Status: AC | PRN
  Administered 2014-11-14: 25 ug via INTRAVENOUS
  Administered 2014-11-14: 50 ug via INTRAVENOUS

## 2014-11-14 MED ORDER — CEFAZOLIN SODIUM-DEXTROSE 2-3 GM-% IV SOLR
INTRAVENOUS | Status: AC
  Administered 2014-11-14: 2 g via INTRAVENOUS
  Filled 2014-11-14: qty 50

## 2014-11-14 MED ORDER — LIDOCAINE HCL 1 % IJ SOLN
INTRAMUSCULAR | Status: AC
  Filled 2014-11-14: qty 20

## 2014-11-14 MED ORDER — SODIUM CHLORIDE 0.9 % IV SOLN
INTRAVENOUS | Status: AC | PRN
  Administered 2014-11-14: 10 mL/h via INTRAVENOUS

## 2014-11-14 MED ORDER — ONDANSETRON HCL 4 MG/2ML IJ SOLN
4.0000 mg | Freq: Once | INTRAMUSCULAR | Status: AC
  Administered 2014-11-14: 4 mg via INTRAVENOUS

## 2014-11-14 MED ORDER — LIDOCAINE-EPINEPHRINE (PF) 1 %-1:200000 IJ SOLN
INTRAMUSCULAR | Status: AC
  Filled 2014-11-14: qty 10

## 2014-11-14 MED ORDER — MIDAZOLAM HCL 2 MG/2ML IJ SOLN
INTRAMUSCULAR | Status: AC
  Filled 2014-11-14: qty 4

## 2014-11-14 MED ORDER — MIDAZOLAM HCL 2 MG/2ML IJ SOLN
INTRAMUSCULAR | Status: AC | PRN
  Administered 2014-11-14: 0.5 mg via INTRAVENOUS
  Administered 2014-11-14: 1 mg via INTRAVENOUS

## 2014-11-14 NOTE — Procedures (Signed)
Interventional Radiology Procedure Note  Procedure: Placement of a LEFT IJ approach single lumen PowerPort.  Tip is positioned at the superior cavoatrial junction and catheter is ready for immediate use.  Complications: No immediate Recommendations:  - Ok to shower tomorrow - Do not submerge for 7 days - Routine line care   Signed,  Athziry Millican K. Nishawn Rotan, MD   

## 2014-11-14 NOTE — Discharge Instructions (Signed)
Implanted Port Insertion An implanted port is a central line that has a round shape and is placed under the skin. It is used as a long-term IV access for:   Medicines, such as chemotherapy.   Fluids.   Liquid nutrition, such as total parenteral nutrition (TPN).   Blood samples.  LET [facility] CARE PROVIDER KNOW ABOUT:  Allergies to food or medicine.   Medicines taken, including vitamins, herbs, eye drops, creams, and over-the-counter medicines.   Any allergies to heparin.  Use of steroids (by mouth or creams).   Previous problems with anesthetics or numbing medicines.   History of bleeding problems or blood clots.   Previous surgery.   Other health problems, including diabetes and kidney problems.   Possibility of pregnancy, if this applies. RISKS AND COMPLICATIONS Generally, this is a safe procedure. However, as with any procedure, problems can occur. Possible problems include:  Damage to the blood vessel, bruising, or bleeding at the puncture site.   Infection.  Blood clot in the vessel that the port is in.  Breakdown of the skin over your port.  Very rarely a person may develop a condition called a pneumothorax, a collection of air in the chest that may cause one of the lungs to collapse. The placement of these catheters with the appropriate imaging guidance significantly decreases the risk of a pneumothorax.  BEFORE THE PROCEDURE   Your health care provider may want you to have blood tests. These tests can help tell how well your kidneys and liver are working. They can also show how well your blood clots.   If you take blood thinners (anticoagulant medicines), ask your health care provider when you should stop taking them.   Make arrangements for someone to drive you home. This is necessary if you have been sedated for your procedure.  PROCEDURE  Port insertion usually takes about 30-45 minutes.   An IV needle will be inserted in your arm.  Medicine for pain and medicine to help relax you (sedative) will flow directly into your body through this needle.   You will lie on an exam table, and you will be connected to monitors to keep track of your heart rate, blood pressure, and breathing throughout the procedure.  An oxygen monitoring device may be attached to your finger. Oxygen will be given.   Everything will be kept as germ free (sterile) as possible during the procedure. The skin near the point of the incision will be cleansed with antiseptic, and the area will be draped with sterile towels. The skin and deeper tissues over the port area will be made numb with a local anesthetic.  Two small cuts (incisions) will be made in the skin to insert the port. One will be made in the neck to obtain access to the vein where the catheter will lie.   Because the port reservoir will be placed under the skin, a small skin incision will be made in the upper chest, and a small pocket for the port will be made under the skin. The catheter that will be connected to the port tunnels to a large central vein in the chest. A small, raised area will remain on your body at the site of the reservoir when the procedure is complete.  The port placement will be done under imaging guidance to ensure the proper placement.  The reservoir has a silicone covering that can be punctured with a special needle.   The port will be flushed with normal  saline, and blood will be drawn to make sure it is working properly.  There will be nothing remaining outside the skin when the procedure is finished.   Incisions will be held together by stitches, surgical glue, or a special tape. AFTER THE PROCEDURE  You will stay in a recovery area until the anesthesia has worn off. Your blood pressure and pulse will be checked.  A final chest X-ray will be taken to check the placement of the port and to ensure that there is no injury to your lung. Document Released:  05/23/2013 Document Revised: 12/17/2013 Document Reviewed: 05/23/2013 [facility] Patient Information 2015 [place], [place]. This information is not intended to replace advice given to you by your health care provider. Make sure you discuss any questions you have with your health care provider. Implanted Port Insertion, Care After Refer to this sheet in the next few weeks. These instructions provide you with information on caring for yourself after your procedure. Your health care provider may also give you more specific instructions. Your treatment has been planned according to current medical practices, but problems sometimes occur. Call your health care provider if you have any problems or questions after your procedure. WHAT TO EXPECT AFTER THE PROCEDURE After your procedure, it is typical to have the following:   Discomfort at the port insertion site. Ice packs to the area will help.  Bruising on the skin over the port. This will subside in 3-4 days. HOME CARE INSTRUCTIONS  After your port is placed, you will get a manufacturer's information card. The card has information about your port. Keep this card with you at all times.   Know what kind of port you have. There are many types of ports available.   Wear a medical alert bracelet in case of an emergency. This can help alert health care workers that you have a port.   The port can stay in for as long as your health care provider believes it is necessary.   A home health care nurse may give medicines and take care of the port.   You or a family member can get special training and directions for giving medicine and taking care of the port at home.  SEEK MEDICAL CARE IF:   Your port does not flush or you are unable to get a blood return.   You have a fever or chills. SEEK IMMEDIATE MEDICAL CARE IF:  You have new fluid or pus coming from your incision.   You notice a bad smell coming from your incision site.   You have  swelling, pain, or more redness at the incision or port site.   You have chest pain or shortness of breath. Document Released: 05/23/2013 Document Revised: 08/07/2013 Document Reviewed: 05/23/2013 [facility] Patient Information 2015 [place], [place]. This information is not intended to replace advice given to you by your health care provider. Make sure you discuss any questions you have with your health care provider.

## 2014-11-14 NOTE — Telephone Encounter (Signed)
Pam, PA in Radiology called for clarification regarding a Port a Cath insertion.  Ms. Bibian was in the IR Department and told Pam that she thought Dr. Marlou Starks was going to place her Port.  Pam wanted to clarify the order.  As we spoke, she realized the order to have IR place the [facility] originated in Dr. Ethlyn Gallery office.

## 2014-11-14 NOTE — H&P (Signed)
Chief Complaint: Breast Ca  Referring Physician(s): Dr Marlou Starks Dr Magrinat  History of Present Illness: Jordan [name] is a 64 y.o. female   Dx Breast Ca 09/2014 Lumpectomy with Dr Marlou Starks 10/2014 Triple neg breast ca To start chemotherapy next week Radiation therapy soon Scheduled now for [facility] a cath placement  Past Medical History  Diagnosis Date  . Hyperlipidemia     takes Zetia and Zocor daily  . Thyroid disease   . Nasal congestion   . Leg swelling   . Constipation   . Nausea   . Generalized headaches     due to allergies, sinus  . PONV (postoperative nausea and vomiting)     pt states she is very easy to sedate  . Hypertension     takes Maxzide and Metoprolol daily  . Pneumonia     walking about 6-68yr ago  . History of bronchitis     last time about 6-722yrago  . Hx of seasonal allergies     takes OTC allergy med nightly  . History of migraine     last one about 15+yrs ago  . Dizziness     r/t side effects from meds  . Vertigo     takes Meclizine prn  . Joint pain   . Back pain     buldging disc  . GERD (gastroesophageal reflux disease)     takes Protonix as needed  . Hemorrhoids   . History of colon polyps   . Mass of colon   . Cancer     colon  . History of UTI   . Hypothyroidism     takes Synthroid daily  . Depression     takes Wellbutrin daily  . Wears glasses     Past Surgical History  Procedure Laterality Date  . Cesarean section  1968, 1970  . Knee surgery  1998    right - arthroscopic  . Abdominal hysterectomy    . Exploratory laparotomy    . Carpal tunnel release    . Colonoscopy    . Esophagogastroduodenoscopy    . Partial colectomy  03/30/2012    Procedure: PARTIAL COLECTOMY;  Surgeon: JaGwenyth OberMD;  Location: [facility] Service: General;  Laterality: N/A;  . Appendectomy    . Radioactive seed guided mastectomy with axillary sentinel lymph node biopsy Right 10/17/2014    Procedure: RADIOACTIVE SEED GUIDED PARTIAL MASTECTOMY  WITH AXILLARY SENTINEL LYMPH NODE BIOPSY;  Surgeon: PaAutumn MessingII, MD;  Location: [facility] Service: General;  Laterality: Right;    Allergies: Review of patient's allergies indicates no known allergies.  Medications: Prior to Admission medications   Medication Sig Start Date End Date Taking? Authorizing Provider  aspirin 81 MG tablet Take 81 mg by mouth daily.   Yes Historical Provider, MD  buPROPion (WELLBUTRIN XL) 150 MG 24 hr tablet Take 1 tablet (150 mg total) by mouth daily. 09/11/14  Yes Chelle S Jeffery, PA-C  ezetimibe (ZETIA) 10 MG tablet Take 1 tablet (10 mg total) by mouth daily. 08/20/14  Yes Chelle S Jeffery, PA-C  levothyroxine (SYNTHROID, LEVOTHROID) 125 MCG tablet TAKE 1 TABLET BY MOUTH DAILY 09/11/14  Yes Chelle S Jeffery, PA-C  meloxicam (MOBIC) 15 MG tablet Take 1 tablet (15 mg total) by mouth daily. 08/20/14  Yes Chelle S Jeffery, PA-C  metoprolol succinate (TOPROL-XL) 25 MG 24 hr tablet Take 1 tablet (25 mg total) by mouth daily. 08/20/14  Yes Chelle S Janalee Dane  PA-C  oxyCODONE-acetaminophen (ROXICET) 5-325 MG per tablet Take 1-2 tablets by mouth every 4 (four) hours as needed. 10/17/14  Yes [name], MD  Prenatal Multivit-Min-Fe-FA (PRENATAL VITAMINS PO) Take by mouth.   Yes Historical Provider, MD  simvastatin (ZOCOR) 40 MG tablet Take 1 tablet (40 mg total) by mouth at bedtime. 08/20/14  Yes Chelle Janalee Dane, PA-C  triamterene-hydrochlorothiazide (MAXZIDE-25) 37.5-25 MG per tablet TAKE 1 TABLET DAILY 08/20/14  Yes Chelle S Jeffery, PA-C  meclizine (ANTIVERT) 25 MG tablet Take 1 tablet (25 mg total) by mouth 4 (four) times daily as needed for dizziness. 08/20/14   Chelle S Jeffery, PA-C  nitroGLYCERIN (NITROSTAT) 0.4 MG SL tablet Place 1 tablet (0.4 mg total) under the tongue every 5 (five) minutes as needed for chest pain. 11/23/12   Fara Chute, PA-C     Family History  Problem Relation Age of Onset  . Cancer Father 58    prostate  . Cancer Brother 55     colon  . Deep vein thrombosis Brother   . Cancer Maternal Aunt 42    breast  . Cancer Maternal Grandmother 60    breast  . Cancer Paternal Grandmother     colon  . Pulmonary embolism Brother     long-distance truck driver (PE x 2)  . Cancer Brother 49    colon  . Diabetes Maternal Grandfather   . Cancer Maternal Aunt 75    unknown type  . Mental illness Sister     history of Depression  . Seizures Sister 8    s/p traumatic brain injury  . Cancer Maternal Uncle     prostate    History   Social History  . Marital Status: Married    Spouse Name: Woodford  . Number of Children: 1  . Years of Education: 16   Occupational History  . retired Pharmacist, hospital    Social History Main Topics  . Smoking status: Never Smoker   . Smokeless tobacco: Never Used  . Alcohol Use: No  . Drug Use: No  . Sexual Activity:    Partners: Male    Birth Control/ Protection: Surgical   Other Topics Concern  . None   Social History Narrative   Lives with her husband and her granddaughter, who she has raised as her own.  Marriage has been unhappy for a long time, and planned to leave when her granddaughter left for college.  She's currently studying make-up at a community college and may pursue cosmetology in the future.      Review of Systems: A 12 point ROS discussed and pertinent positives are indicated in the HPI above.  All other systems are negative.  Review of Systems  Constitutional: Negative for fever, activity change, appetite change and unexpected weight change.  Respiratory: Negative for cough and shortness of breath.   Gastrointestinal: Negative for abdominal pain.  Neurological: Negative for weakness.  Psychiatric/Behavioral: Negative for behavioral problems and confusion.    Vital Signs: BP 101/51 mmHg  Pulse 70  Temp(Src) 98.5 F (36.9 C) (Oral)  Resp 18  Ht '5\' 1"'  (1.549 m)  Wt 105.235 kg (232 lb)  BMI 43.86 kg/m2  SpO2 97%  Physical Exam  Constitutional: She is  oriented to person, place, and time. She appears well-nourished.  Cardiovascular: Normal rate, regular rhythm and normal heart sounds.   No murmur heard. Pulmonary/Chest: Effort normal and breath sounds normal. She has no wheezes.  Abdominal: Soft. Bowel sounds are normal. There  is no tenderness.  Musculoskeletal: Normal range of motion.  Neurological: She is alert and oriented to person, place, and time.  Skin: Skin is warm and dry.  Psychiatric: She has a normal mood and affect. Her behavior is normal. Judgment and thought content normal.  Nursing note and vitals reviewed.   Mallampati Score:  MD Evaluation Airway: WNL Heart: WNL Abdomen: WNL Chest/ Lungs: WNL ASA  Classification: 3 Mallampati/Airway Score: One  Imaging: Nm Sentinel Node Inj-no Rpt (breast)  10/17/2014   CLINICAL DATA: right breast cancer   Sulfur colloid was injected intradermally by the nuclear medicine  technologist for breast cancer sentinel node localization.    Mm Breast Surgical Specimen  10/17/2014   CLINICAL DATA:  Status post right lumpectomy.  EXAM: SPECIMEN RADIOGRAPH OF THE RIGHT BREAST  COMPARISON:  Previous exam(s).  FINDINGS: Status post excision of the right breast. The radioactive seed and biopsy marker clip are present, completely intact, and are marked for pathology. These findings were discussed by telephone only patient was in the operating room.  IMPRESSION: Specimen radiograph of the right breast.   Electronically Signed   By: Andres Shad   On: 10/17/2014 15:21   Mm Rt Radioactive Seed Loc Mammo Guide  10/16/2014   CLINICAL DATA:  Recently diagnosed right breast invasive ductal carcinoma.  EXAM: MAMMOGRAPHIC GUIDED RADIOACTIVE SEED LOCALIZATION OF THE RIGHT BREAST  COMPARISON:  Previous exam(s).  FINDINGS: Patient presents for radioactive seed localization prior to right lumpectomy. I met with the patient and we discussed the procedure of seed localization including benefits and alternatives. We  discussed the high likelihood of a successful procedure. We discussed the risks of the procedure including infection, bleeding, tissue injury and further surgery. We discussed the low dose of radioactivity involved in the procedure. Informed, written consent was given.  The usual time-out protocol was performed immediately prior to the procedure.  Using mammographic guidance, sterile technique, 2% lidocaine and an I-125 radioactive seed, the recently placed coil shaped marker clip in the lower inner right breast was localized using a medial approach. The follow-up mammogram images confirm the seed in the expected location and are marked for Dr. Marlou Starks.  Follow-up survey of the patient confirms presence of the radioactive seed.  Order number of I-125 seed:  [id number].  Total activity:  0.250 mCi  Reference Date: 10/04/2014  The patient tolerated the procedure well and was released from the [place]. She was given instructions regarding seed removal.  IMPRESSION: Radioactive seed localization right breast. No apparent complications.   Electronically Signed   By: Claudie Revering M.D.   On: 10/16/2014 11:03    Labs:  CBC:  Recent Labs  01/31/14 1350 08/20/14 1238 10/02/14 1146 10/17/14 1243  WBC 10.1 10.2 9.8  --   HGB 15.8 14.5 14.5 15.2*  HCT 50.1* 43.1 44.5  --   PLT  --  323 246  --     COAGS: No results for input(s): INR, APTT in the last 8760 hours.  BMP:  Recent Labs  01/31/14 1342 08/20/14 1238 10/02/14 1147  NA 141 142 145  K 4.0 4.1 3.5  CL 102 104  --   CO2 '29 29 27  ' GLUCOSE 93 85 96  BUN 10 13 13.2  CALCIUM 9.0 9.0 8.8  CREATININE 0.90 1.04 1.0    LIVER FUNCTION TESTS:  Recent Labs  01/31/14 1342 08/20/14 1238 10/02/14 1147  BILITOT 0.6 0.6 0.52  AST '27 23 26  ' ALT 27 24 28  ALKPHOS 55 54 52  PROT 6.5 6.9 6.5  ALBUMIN 3.7 3.9 3.3*    TUMOR MARKERS: No results for input(s): AFPTM, CEA, CA199, CHROMGRNA in the last 8760 hours.  Assessment and  Plan:  Breast Ca For chemotherapy to start next week Scheduled for PAC placement Risks and Benefits discussed with the patient including, but not limited to bleeding, infection, pneumothorax, or fibrin sheath development and need for additional procedures. All of the patient's questions were answered, patient is agreeable to proceed. Consent signed and in chart.   Thank you for this interesting consult.  I greatly enjoyed meeting Jordan [name] and look forward to participating in their care.  Signed: Almando Brawley A 11/14/2014, 8:06 AM   I spent a total of  20 Minutes   in face to face in clinical consultation, greater than 50% of which was counseling/coordinating care for [facility] placement

## 2014-11-15 ENCOUNTER — Ambulatory Visit (HOSPITAL_BASED_OUTPATIENT_CLINIC_OR_DEPARTMENT_OTHER): Payer: Medicare PPO | Admitting: Oncology

## 2014-11-15 ENCOUNTER — Other Ambulatory Visit: Payer: Self-pay | Admitting: *Deleted

## 2014-11-15 VITALS — BP 147/72 | HR 71 | Temp 98.1°F | Resp 18 | Ht 61.0 in | Wt 232.7 lb

## 2014-11-15 DIAGNOSIS — C50811 Malignant neoplasm of overlapping sites of right female breast: Secondary | ICD-10-CM | POA: Diagnosis not present

## 2014-11-15 DIAGNOSIS — C50311 Malignant neoplasm of lower-inner quadrant of right female breast: Secondary | ICD-10-CM

## 2014-11-15 DIAGNOSIS — Z171 Estrogen receptor negative status [ER-]: Secondary | ICD-10-CM | POA: Diagnosis not present

## 2014-11-15 MED ORDER — LORATADINE 10 MG PO TABS
10.0000 mg | ORAL_TABLET | Freq: Every day | ORAL | Status: DC
Start: 2014-11-15 — End: 2015-02-18

## 2014-11-15 MED ORDER — LIDOCAINE-PRILOCAINE 2.5-2.5 % EX CREA: TOPICAL_CREAM | CUTANEOUS | Status: DC

## 2014-11-15 MED ORDER — DEXAMETHASONE 4 MG PO TABS: 8.0000 mg | ORAL_TABLET | Freq: Two times a day (BID) | ORAL | Status: DC

## 2014-11-15 MED ORDER — ONDANSETRON HCL 8 MG PO TABS: 8.0000 mg | ORAL_TABLET | Freq: Two times a day (BID) | ORAL | Status: DC

## 2014-11-15 MED ORDER — TOBRAMYCIN-DEXAMETHASONE 0.3-0.1 % OP SUSP
1.0000 [drp] | Freq: Two times a day (BID) | OPHTHALMIC | Status: DC
Start: 2014-11-15 — End: 2014-12-30

## 2014-11-15 MED ORDER — PROCHLORPERAZINE MALEATE 10 MG PO TABS: 10.0000 mg | ORAL_TABLET | Freq: Four times a day (QID) | ORAL | Status: DC | PRN

## 2014-11-15 MED ORDER — LORAZEPAM 0.5 MG PO TABS: 0.5000 mg | ORAL_TABLET | Freq: Every evening | ORAL | Status: DC | PRN

## 2014-11-15 NOTE — Progress Notes (Signed)
Jordan [name]  [phone] Fax:([phone]) [phone]     ID: Jordan [name] DOB: 1951/03/20  MR#: [id number]  DTO#:[id number]  Patient Care Team: Fara Chute, PA-C as PCP - General (Physician Assistant) [name], MD as Consulting Physician (General Surgery) Chauncey Cruel, MD as Consulting Physician (Oncology) Arloa Koh, MD as Consulting Physician (Radiation Oncology) Mauro Kaufmann, RN as Registered Nurse Rockwell Germany, RN as Registered Nurse Holley Bouche, NP as Nurse Practitioner (Nurse Practitioner) PCP: [name] OTHER MD: [name] MD  CHIEF COMPLAINT: Early stage triple negative breast cancer  CURRENT TREATMENT: adjuvant chemotherapy   BREAST CANCER HISTORY: From the original intake note:  "Jordan [name]" [facility] showing breast density category B. A possible mass in the right breast was noted and on Jerry 20 03/05/2015 the patient underwent right diagnostic mammography with right ultrasonography. Spot compression views confirmed a slightly irregular mass in the lower inner quadrant of the right breast. This was not palpable by physical exam. Ultrasound showed a small hypoechoic mass measuring 5 mm in the area in question. Ultrasound of the right axilla was unremarkable.  Biopsy of the mass in question fibrin 05/06/2015 showed ([id number]) and invasive ductal carcinoma, grade 2, estrogen and progesterone receptor negative, with an MIB-1 of 41%, and no HER-2 amplification.  Bilateral breast MRI was obtained 10/04/2014. Results are pending.  The patient's subsequent history is as detailed below   INTERVAL HISTORY: Jordan [name] returns today for follow-up of her right-sided triple negative breast cancer accompanied by her husband [name]. When we last met we discussed chemotherapy and she understood that because her cancer was triple negative chemotherapy would be the only option  for systemic treatment. She thought about this, came to chemotherapy school, and was able to make a decision in favor of chemotherapy. She went ahead and had her port placed yesterday. She is here today to operationalized her decision.   REVIEW OF SYSTEMS: She never did fall asleep during the port placement and she feels there was pretty "rough". She was given some Tylenol for pain and it is not working very well. We discussed her using some Aleve and ice. She's had a poor appetite for the last 24 hours. She does have some arthritis which is not more persistent or intense than prior and she walks with a cane. She feels anxious and depressed. A detailed review of systems today was otherwise stable.  PAST MEDICAL HISTORY: Past Medical History  Diagnosis Date  . Hyperlipidemia     takes Zetia and Zocor daily  . Thyroid disease   . Nasal congestion   . Leg swelling   . Constipation   . Nausea   . Generalized headaches     due to allergies, sinus  . PONV (postoperative nausea and vomiting)     pt states she is very easy to sedate  . Hypertension     takes Maxzide and Metoprolol daily  . Pneumonia     walking about 6-31yr ago  . History of bronchitis     last time about 6-737yrago  . Hx of seasonal allergies     takes OTC allergy med nightly  . History of migraine     last one about 15+yrs ago  . Dizziness     r/t side effects from meds  . Vertigo     takes Meclizine prn  . Joint pain   . Back pain  buldging disc  . GERD (gastroesophageal reflux disease)     takes Protonix as needed  . Hemorrhoids   . History of colon polyps   . Mass of colon   . Cancer     colon  . History of UTI   . Hypothyroidism     takes Synthroid daily  . Depression     takes Wellbutrin daily  . Wears glasses     PAST SURGICAL HISTORY: Past Surgical History  Procedure Laterality Date  . Cesarean section  1968, 1970  . Knee surgery  1998    right - arthroscopic  . Abdominal hysterectomy      . Exploratory laparotomy    . Carpal tunnel release    . Colonoscopy    . Esophagogastroduodenoscopy    . Partial colectomy  03/30/2012    Procedure: PARTIAL COLECTOMY;  Surgeon: [name], MD;  Location: [facility];  Service: General;  Laterality: N/A;  . Appendectomy    . Radioactive seed guided mastectomy with axillary sentinel lymph node biopsy Right 10/17/2014    Procedure: RADIOACTIVE SEED GUIDED PARTIAL MASTECTOMY WITH AXILLARY SENTINEL LYMPH NODE BIOPSY;  Surgeon: [name], MD;  Location: [place] [facility];  Service: General;  Laterality: Right;    FAMILY HISTORY Family History  Problem Relation Age of Onset  . Cancer Father 40    prostate  . Cancer Brother 82    colon  . Deep vein thrombosis Brother   . Cancer Maternal Aunt 30    breast  . Cancer Maternal Grandmother 1    breast  . Cancer Paternal Grandmother     colon  . Pulmonary embolism Brother     long-distance truck driver (PE x 2)  . Cancer Brother 66    colon  . Diabetes Maternal Grandfather   . Cancer Maternal Aunt 75    unknown type  . Mental illness Sister     history of Depression  . Seizures Sister 8    s/p traumatic brain injury  . Cancer Maternal Uncle     prostate   the patient's parents are still living, as of February 2016. Her father is 67 years old, with a history of prostate cancer diagnosed in his late 85s. The patient's mother is 19 years old. The patient had 5 brothers, 3 sisters. One brother died with prostate cancer at age 11. Another brother has a history of prostate cancer, age 84. One brother had colon cancer diagnosed age 73 as did a paternal grandmother. A maternal grandmother had breast cancer at age 79 as did a maternal aunt (diagnosed age 58.) A cousin was diagnosed with breast cancer the age of 73.  GYNECOLOGIC HISTORY:  No LMP recorded. Patient has had a hysterectomy. Menarche age 2, first live birth age 5. The patient is GX P1. She stopped having periods in 1981  when she underwent hysterectomy and unilateral salpingo-oophorectomy.. She did not take hormone replacement.  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 Her husband [name] owns a business that [employer] at night. The patient's biological son [name] works in Biomedical scientist in [place]. He has 2 children. One of them, the patient's granddaughter [name], 42 years old, lives with the patient, and is a Interior and spatial designer at [employer]. The patient also has an adopted son, [name], lives in [place]. Jordan [name] attends a [employer] church in [place] where she grew up    ADVANCED DIRECTIVES: Not in place  HEALTH MAINTENANCE:  History  Substance Use Topics  . Smoking status: Never Smoker   . Smokeless tobacco: Never Used  . Alcohol Use: No     Colonoscopy: 03/30/2013/[facility]  PAP:  Bone density:  Lipid panel:  No Known Allergies  Current Outpatient Prescriptions  Medication Sig Dispense Refill  . aspirin 81 MG tablet Take 81 mg by mouth daily.    Marland Kitchen buPROPion (WELLBUTRIN XL) 150 MG 24 hr tablet Take 1 tablet (150 mg total) by mouth daily. 30 tablet 5  . dexamethasone (DECADRON) 4 MG tablet Take 2 tablets (8 mg total) by mouth 2 (two) times daily. Start the day before Taxotere. Then again the day after chemo for 3 days. 30 tablet 1  . ezetimibe (ZETIA) 10 MG tablet Take 1 tablet (10 mg total) by mouth daily. 30 tablet 5  . levothyroxine (SYNTHROID, LEVOTHROID) 125 MCG tablet TAKE 1 TABLET BY MOUTH DAILY 90 tablet 1  . lidocaine-prilocaine (EMLA) cream Apply to affected area once 30 g 3  . loratadine (CLARITIN) 10 MG tablet Take 1 tablet (10 mg total) by mouth daily. 30 tablet 4  . LORazepam (ATIVAN) 0.5 MG tablet Take 1 tablet (0.5 mg total) by mouth at bedtime as needed (Nausea or vomiting). 30 tablet 0  . meclizine (ANTIVERT) 25 MG tablet Take 1 tablet (25 mg total) by mouth 4 (four) times daily as needed for dizziness. 30 tablet 1  . meloxicam (MOBIC) 15  MG tablet Take 1 tablet (15 mg total) by mouth daily. 30 tablet 1  . metoprolol succinate (TOPROL-XL) 25 MG 24 hr tablet Take 1 tablet (25 mg total) by mouth daily. 30 tablet 5  . nitroGLYCERIN (NITROSTAT) 0.4 MG SL tablet Place 1 tablet (0.4 mg total) under the tongue every 5 (five) minutes as needed for chest pain. 30 tablet 1  . ondansetron (ZOFRAN) 8 MG tablet Take 1 tablet (8 mg total) by mouth 2 (two) times daily. Start the day after chemo for 3 days. Then take as needed for nausea or vomiting. 30 tablet 1  . oxyCODONE-acetaminophen (ROXICET) 5-325 MG per tablet Take 1-2 tablets by mouth every 4 (four) hours as needed. 50 tablet 0  . Prenatal Multivit-Min-Fe-FA (PRENATAL VITAMINS PO) Take by mouth.    . prochlorperazine (COMPAZINE) 10 MG tablet Take 1 tablet (10 mg total) by mouth every 6 (six) hours as needed (Nausea or vomiting). 30 tablet 1  . simvastatin (ZOCOR) 40 MG tablet Take 1 tablet (40 mg total) by mouth at bedtime. 30 tablet 5  . tobramycin-dexamethasone (TOBRADEX) ophthalmic solution Place 1 drop into both eyes 2 (two) times daily. 5 mL 0  . triamterene-hydrochlorothiazide (MAXZIDE-25) 37.5-25 MG per tablet TAKE 1 TABLET DAILY 30 tablet 12   No current facility-administered medications for this visit.   Facility-Administered Medications Ordered in Other Visits  Medication Dose Route Frequency Provider Last Rate Last Dose  . chlorhexidine (HIBICLENS) 4 % liquid 1 application  1 application Topical Once [name], MD      . chlorhexidine (HIBICLENS) 4 % liquid 1 application  1 application Topical Once [name], MD        OBJECTIVE: Middle-aged African-American woman who appears stated age 89 Vitals:   11/15/14 1334  BP: 147/72  Pulse: 71  Temp: 98.1 F (36.7 C)  Resp: 18     Body mass index is 43.99 kg/(m^2).    ECOG FS:1 - Symptomatic but completely ambulatory  Sclerae unicteric, pupils round and equal Oropharynx clear and moist No  cervical or  supraclavicular adenopathy; port in the left upper anterior chest, bandage clean and dry Lungs no rales or rhonchi Heart regular rate and rhythm Abd soft, obese, nontender, positive bowel sounds MSK no focal spinal tenderness, no upper extremity lymphedema Neuro: nonfocal, well oriented, appropriate affect Breasts: Deferred    LAB RESULTS:  CMP     Component Value Date/Time   NA 144 11/14/2014 0903   NA 145 10/02/2014 1147   K 3.3* 11/14/2014 0903   K 3.5 10/02/2014 1147   CL 107 11/14/2014 0903   CO2 27 11/14/2014 0903   CO2 27 10/02/2014 1147   GLUCOSE 99 11/14/2014 0903   GLUCOSE 96 10/02/2014 1147   BUN 11 11/14/2014 0903   BUN 13.2 10/02/2014 1147   CREATININE 1.10 11/14/2014 0903   CREATININE 1.0 10/02/2014 1147   CREATININE 1.04 08/20/2014 1238   CALCIUM 9.0 11/14/2014 0903   CALCIUM 8.8 10/02/2014 1147   PROT 6.5 10/02/2014 1147   PROT 6.9 08/20/2014 1238   ALBUMIN 3.3* 10/02/2014 1147   ALBUMIN 3.9 08/20/2014 1238   AST 26 10/02/2014 1147   AST 23 08/20/2014 1238   ALT 28 10/02/2014 1147   ALT 24 08/20/2014 1238   ALKPHOS 52 10/02/2014 1147   ALKPHOS 54 08/20/2014 1238   BILITOT 0.52 10/02/2014 1147   BILITOT 0.6 08/20/2014 1238   GFRNONAA 51* 11/14/2014 0903   GFRAA 59* 11/14/2014 0903    INo results found for: SPEP, UPEP  Lab Results  Component Value Date   WBC 8.5 11/14/2014   NEUTROABS 5.8 10/02/2014   HGB 13.9 11/14/2014   HCT 42.5 11/14/2014   MCV 86.7 11/14/2014   PLT 279 11/14/2014      Chemistry      Component Value Date/Time   NA 144 11/14/2014 0903   NA 145 10/02/2014 1147   K 3.3* 11/14/2014 0903   K 3.5 10/02/2014 1147   CL 107 11/14/2014 0903   CO2 27 11/14/2014 0903   CO2 27 10/02/2014 1147   BUN 11 11/14/2014 0903   BUN 13.2 10/02/2014 1147   CREATININE 1.10 11/14/2014 0903   CREATININE 1.0 10/02/2014 1147   CREATININE 1.04 08/20/2014 1238      Component Value Date/Time   CALCIUM 9.0 11/14/2014 0903   CALCIUM 8.8  10/02/2014 1147   ALKPHOS 52 10/02/2014 1147   ALKPHOS 54 08/20/2014 1238   AST 26 10/02/2014 1147   AST 23 08/20/2014 1238   ALT 28 10/02/2014 1147   ALT 24 08/20/2014 1238   BILITOT 0.52 10/02/2014 1147   BILITOT 0.6 08/20/2014 1238       No results found for: LABCA2  No components found for: LABCA125   Recent Labs Lab 11/14/14 0903  INR 1.03    Urinalysis    Component Value Date/Time   COLORURINE YELLOW 03/09/2011 0857   APPEARANCEUR CLOUDY* 03/09/2011 0857   LABSPEC 1.022 03/09/2011 0857   PHURINE 5.5 03/09/2011 [place] 03/09/2011 0857   HGBUR NEGATIVE 03/09/2011 0857   BILIRUBINUR SMALL* 03/09/2011 0857   KETONESUR NEGATIVE 03/09/2011 0857   PROTEINUR NEGATIVE 03/09/2011 0857   UROBILINOGEN 0.2 03/09/2011 0857   NITRITE NEGATIVE 03/09/2011 0857   LEUKOCYTESUR LARGE* 03/09/2011 0857    STUDIES: Nm Sentinel Node Inj-no Rpt (breast)  10/17/2014   CLINICAL DATA: right breast cancer   Sulfur colloid was injected intradermally by the nuclear medicine  technologist for breast cancer sentinel node localization.    Mm Breast Surgical Specimen  10/17/2014   CLINICAL DATA:  Status post right lumpectomy.  EXAM: SPECIMEN RADIOGRAPH OF THE RIGHT BREAST  COMPARISON:  Previous exam(s).  FINDINGS: Status post excision of the right breast. The radioactive seed and biopsy marker clip are present, completely intact, and are marked for pathology. These findings were discussed by telephone only patient was in the operating room.  IMPRESSION: Specimen radiograph of the right breast.   Electronically Signed   By: Andres Shad   On: 10/17/2014 15:21   Ir Fluoro Guide Cv Line Left  11/14/2014   CLINICAL DATA:  64 year old female with right-sided breast cancer. She requires durable central venous access for chemotherapy. A left IJ approach port catheter will be placed.  EXAM: IR LEFT FLOURO GUIDE CV LINE; IR ULTRASOUND GUIDANCE VASC ACCESS LEFT  Date: 11/14/2014   ANESTHESIA/SEDATION: Moderate (conscious) sedation was administered during this procedure. A total of 1.5 mg Versed and 75 mg Fentanyl were administered intravenously. The patient's vital signs were monitored continuously by radiology nursing throughout the course of the procedure.  Total sedation time: 36 minutes  FLUOROSCOPY TIME:  12 seconds  18 mGy  TECHNIQUE: The left neck and chest was prepped with chlorhexidine, and draped in the usual sterile fashion using maximum barrier technique (cap and mask, sterile gown, sterile gloves, large sterile sheet, hand hygiene and cutaneous antiseptic). Antibiotic prophylaxis was provided with 2g Ancef administered IV one hour prior to skin incision. Local anesthesia was attained by infiltration with 1% lidocaine with epinephrine.  Ultrasound demonstrated patency of the left internal jugular vein, and this was documented with an image. Under real-time ultrasound guidance, this vein was accessed with a 21 gauge micropuncture needle and image documentation was performed. A small dermatotomy was made at the access site with an 11 scalpel. A 0.018" wire was advanced into the SVC and the access needle exchanged for a 60F micropuncture vascular sheath. The 0.018" wire was then removed and a 0.035" wire advanced into the IVC.  An appropriate location for the subcutaneous reservoir was selected below the clavicle and an incision was made through the skin and underlying soft tissues. The subcutaneous tissues were then dissected using a combination of blunt and sharp surgical technique and a pocket was formed. A single lumen power injectable portacatheter was then tunneled through the subcutaneous tissues from the pocket to the dermatotomy and the port reservoir placed within the subcutaneous pocket.  The venous access site was then serially dilated and a peel away vascular sheath placed over the wire. The wire was removed and the port catheter advanced into position under fluoroscopic  guidance. The catheter tip is positioned in the upper right atrium. This was documented with a spot image. The portacatheter was then tested and found to flush and aspirate well. The port was flushed with saline followed by 100 units/mL heparinized saline.  The pocket was then closed in two layers using first subdermal inverted interrupted absorbable sutures followed by a running subcuticular suture. The epidermis was then sealed with Dermabond. The dermatotomy at the venous access site was also sealed with Dermabond.  COMPLICATIONS: None.  The patient tolerated the procedure well.  IMPRESSION: Successful placement of a left IJ approach Power Port with ultrasound and fluoroscopic guidance. The catheter is ready for use.  Signed,  Criselda Peaches, MD  [facility]  [facility]   Electronically Signed   By: Jacqulynn Cadet M.D.   On: 11/14/2014 14:43   Ir US Guide Vasc Access Left  11/14/2014   CLINICAL DATA:  64 year old female with right-sided breast cancer. She requires durable central venous access for chemotherapy. A left IJ approach port catheter will be placed.  EXAM: IR LEFT FLOURO GUIDE CV LINE; IR ULTRASOUND GUIDANCE VASC ACCESS LEFT  Date: 11/14/2014  ANESTHESIA/SEDATION: Moderate (conscious) sedation was administered during this procedure. A total of 1.5 mg Versed and 75 mg Fentanyl were administered intravenously. The patient's vital signs were monitored continuously by radiology nursing throughout the course of the procedure.  Total sedation time: 36 minutes  FLUOROSCOPY TIME:  12 seconds  18 mGy  TECHNIQUE: The left neck and chest was prepped with chlorhexidine, and draped in the usual sterile fashion using maximum barrier technique (cap and mask, sterile gown, sterile gloves, large sterile sheet, hand hygiene and cutaneous antiseptic). Antibiotic prophylaxis was provided with 2g Ancef administered IV one hour prior to skin incision. Local anesthesia  was attained by infiltration with 1% lidocaine with epinephrine.  Ultrasound demonstrated patency of the left internal jugular vein, and this was documented with an image. Under real-time ultrasound guidance, this vein was accessed with a 21 gauge micropuncture needle and image documentation was performed. A small dermatotomy was made at the access site with an 11 scalpel. A 0.018" wire was advanced into the SVC and the access needle exchanged for a 48F micropuncture vascular sheath. The 0.018" wire was then removed and a 0.035" wire advanced into the IVC.  An appropriate location for the subcutaneous reservoir was selected below the clavicle and an incision was made through the skin and underlying soft tissues. The subcutaneous tissues were then dissected using a combination of blunt and sharp surgical technique and a pocket was formed. A single lumen power injectable portacatheter was then tunneled through the subcutaneous tissues from the pocket to the dermatotomy and the port reservoir placed within the subcutaneous pocket.  The venous access site was then serially dilated and a peel away vascular sheath placed over the wire. The wire was removed and the port catheter advanced into position under fluoroscopic guidance. The catheter tip is positioned in the upper right atrium. This was documented with a spot image. The portacatheter was then tested and found to flush and aspirate well. The port was flushed with saline followed by 100 units/mL heparinized saline.  The pocket was then closed in two layers using first subdermal inverted interrupted absorbable sutures followed by a running subcuticular suture. The epidermis was then sealed with Dermabond. The dermatotomy at the venous access site was also sealed with Dermabond.  COMPLICATIONS: None.  The patient tolerated the procedure well.  IMPRESSION: Successful placement of a left IJ approach Power Port with ultrasound and fluoroscopic guidance. The catheter is  ready for use.  Signed,  Criselda Peaches, MD  [facility]  [facility]   Electronically Signed   By: Jacqulynn Cadet M.D.   On: 11/14/2014 14:43    ASSESSMENT: 64 y.o.  [place] woman status post right breast lower inner quadrant biopsy 09/24/2014 for a clinical T1a N0, stage IA invasive ductal carcinoma, grade 1 or 2, estrogen and progesterone receptor negative, with an MIB-1 of 41%, and HER-2 negative   (1) status post right lumpectomy and axillary sentinel lymph node sampling 10/17/2014 for a pT1b pN0, stage IA  invasive ductal carcinoma, grade 3, repeat HER-2 again negative.   (2) adjuvant chemotherapy to consist of cyclophosphamide and docetaxel given every 3 weeks 4 with Neulasta support   (3) adjuvant radiation to follow  chemotherapy   (4) status post partial right colectomy with lymphadenectomy 03/30/2012 for a 2.7 cm tubular adenoma with high-grade dysplasia, no evidence of invasion, 0 of 11 lymph nodes involved ([id number])  (5) genetics testing sent 10/04/2014 through the [employer] gene panel /[employer] found no deleterious mutations in ATM, BARD1, BRCA1, BRCA2, BRIP1, CDH1, CHEK2, EPCAM, MLH1, MRE11A, MSH2, MSH6, MUTYH, NBN, NF1, PALB2, PMS2, PTEN, RAD50, RAD51C, RAD51D, SMARCA4, STK11, or TP53  (a) two variants of uncertain significance were found    (i) ATM, p.[id number]   (ii) SMARCA4, p.[id number] PLAN: I spent approximately 35 minutes with Jordan [name] and her husband Patrick Jupiter going over her chemotherapy. She understands 4 cycles of Cytoxan and Taxotere is about the minimum we give with curative intent in patients like her. We discussed the possible toxicities, side effects and complications of these agents and she already had a pretty good idea of what was involved from her prior meeting with our chemotherapy teaching nurse.  We discussed onpro versus coming back as second day for her "shot", and she likes the idea of the onpro so I  wrote for that. I entered all her supportive medications and gave her a copy of the prescription for lorazepam which is the only medicine I was not able to enter electronically. If she has any problems getting these filled for financial or other reasons she will let us know.  We then discussed when to start. After much discussion she decided 418 was the best date for her and her family. I have entered the chemotherapy orders and I have entered appointments for the first 2 cycles of chemotherapy.  Jordan [name] has a good understanding of the overall plan. She agrees with it. She knows the goal of treatment in her case is cure. She will call with any problems that may develop before her next visit here. Chauncey Cruel, MD   11/15/2014 1:42 PM Medical Oncology and Hematology [facility] [address] [place], [place] [address] Tel. [phone]    Fax. [phone]

## 2014-11-18 ENCOUNTER — Encounter: Payer: Self-pay | Admitting: Oncology

## 2014-11-18 ENCOUNTER — Other Ambulatory Visit: Payer: Self-pay | Admitting: General Surgery

## 2014-11-26 ENCOUNTER — Ambulatory Visit (INDEPENDENT_AMBULATORY_CARE_PROVIDER_SITE_OTHER): Payer: Medicare PPO | Admitting: Physician Assistant

## 2014-11-26 ENCOUNTER — Encounter: Payer: Self-pay | Admitting: Physician Assistant

## 2014-11-26 VITALS — BP 112/80 | HR 72 | Temp 98.3°F | Resp 16 | Ht 61.25 in | Wt 229.4 lb

## 2014-11-26 DIAGNOSIS — F329 Major depressive disorder, single episode, unspecified: Secondary | ICD-10-CM | POA: Diagnosis not present

## 2014-11-26 DIAGNOSIS — M19072 Primary osteoarthritis, left ankle and foot: Secondary | ICD-10-CM | POA: Diagnosis not present

## 2014-11-26 DIAGNOSIS — E039 Hypothyroidism, unspecified: Secondary | ICD-10-CM | POA: Diagnosis not present

## 2014-11-26 DIAGNOSIS — C50311 Malignant neoplasm of lower-inner quadrant of right female breast: Secondary | ICD-10-CM | POA: Diagnosis not present

## 2014-11-26 DIAGNOSIS — I10 Essential (primary) hypertension: Secondary | ICD-10-CM | POA: Diagnosis not present

## 2014-11-26 DIAGNOSIS — M19071 Primary osteoarthritis, right ankle and foot: Secondary | ICD-10-CM

## 2014-11-26 DIAGNOSIS — F32A Depression, unspecified: Secondary | ICD-10-CM

## 2014-11-26 NOTE — Patient Instructions (Addendum)
Check with the pharmacy to see if your prescriptions would be less if you get a 3 month supply at a time. Consider using the mail order service, which may also save you money.

## 2014-11-27 NOTE — Progress Notes (Signed)
Patient ID: Jordan [name], female    DOB: 12-18-1950, 64 y.o.   MRN: [id number]  PCP: Imagene Boss, PA-C  Subjective:   Chief Complaint  Patient presents with  . Follow-up    HTN      HPI Jordan [name] presents to follow-up on HTN. She notes that I usually only see her every 6 months for this, and that I mistakenly scheduled her to come back in 3 months for this visit. We agree that it was fortuitous, as in the meantime, she's been diagnosed with breast cancer.  The cancer was found on a screening mammogram 09/03/2014. Biopsy revealed Invasive Ductal carcinoma, grade 2, ER negative, PR negative and HER-2 neu negative. She has undergone RIGHT breast lumpectomy, and recently had a port-a-cath placed in preparation for chemotherapy. Overall, she's tolerating everything pretty well, though she's quite tender at the port site. Her family has been very supportive, as has her church community. She is optimistic that she will tolerate the chemo, but prepared for adverse effects. She's definitely not letting the cancer diagnosis get her down. "I'm done crying."  BP at her last oncology visit was elevated.  She's having some pain in her feet-previously diagnosed osteoarthritis. She's not interested in anything other than continuing the meloxicam she has. Uses a cane prn.   Review of Systems Review of Systems No CP (other than at the lumpectomy and port sites). No SOB, HA, dizziness.    Patient Active Problem List   Diagnosis Date Noted  . Osteoarthritis of both feet 11/26/2014  . Poor venous access   . Colorectal cancer 10/03/2014  . Breast cancer of lower-inner quadrant of right female breast 09/27/2014  . Severe obesity (BMI >= 40) 11/06/2013  . Anemia, iron deficiency 04/25/2013  . GERD (gastroesophageal reflux disease) 02/12/2013  . HTN (hypertension) 10/21/2011  . Hypothyroidism 10/21/2011  . Depression 10/21/2011  . Hyperlipidemia 10/21/2011  . Lung nodules 10/21/2011  . Vertigo  10/21/2011  . Vitamin D deficiency 10/21/2011     Prior to Admission medications   Medication Sig Start Date End Date Taking? Authorizing Provider  aspirin 81 MG tablet Take 81 mg by mouth daily.    Historical Provider, MD  buPROPion (WELLBUTRIN XL) 150 MG 24 hr tablet Take 1 tablet (150 mg total) by mouth daily. 09/11/14   Hassel Uphoff S Qianna Clagett, PA-C  dexamethasone (DECADRON) 4 MG tablet Take 2 tablets (8 mg total) by mouth 2 (two) times daily. Start the day before Taxotere. Then again the day after chemo for 3 days. 11/15/14   Chauncey Cruel, MD  ezetimibe (ZETIA) 10 MG tablet Take 1 tablet (10 mg total) by mouth daily. 08/20/14   Lonzy Mato Janalee Dane, PA-C  levothyroxine (SYNTHROID, LEVOTHROID) 125 MCG tablet TAKE 1 TABLET BY MOUTH DAILY 09/11/14   Fara Chute, PA-C  lidocaine-prilocaine (EMLA) cream Apply to affected area once 11/15/14   Chauncey Cruel, MD  loratadine (CLARITIN) 10 MG tablet Take 1 tablet (10 mg total) by mouth daily. 11/15/14   Chauncey Cruel, MD  LORazepam (ATIVAN) 0.5 MG tablet Take 1 tablet (0.5 mg total) by mouth at bedtime as needed (Nausea or vomiting). 11/15/14   Chauncey Cruel, MD  meclizine (ANTIVERT) 25 MG tablet Take 1 tablet (25 mg total) by mouth 4 (four) times daily as needed for dizziness. 08/20/14   Kenedee Molesky Janalee Dane, PA-C  meloxicam (MOBIC) 15 MG tablet Take 1 tablet (15 mg total) by mouth daily. 08/20/14   Fara Chute, PA-C  metoprolol succinate (TOPROL-XL) 25 MG 24 hr tablet Take 1 tablet (25 mg total) by mouth daily. 08/20/14   Dorrance Sellick S Christionna Poland, PA-C  nitroGLYCERIN (NITROSTAT) 0.4 MG SL tablet Place 1 tablet (0.4 mg total) under the tongue every 5 (five) minutes as needed for chest pain. 11/23/12   Rulon Abdalla S Jud Fanguy, PA-C  ondansetron (ZOFRAN) 8 MG tablet Take 1 tablet (8 mg total) by mouth 2 (two) times daily. Start the day after chemo for 3 days. Then take as needed for nausea or vomiting. 11/15/14   Chauncey Cruel, MD  oxyCODONE-acetaminophen (ROXICET) 5-325  MG per tablet Take 1-2 tablets by mouth every 4 (four) hours as needed. 10/17/14   [name], MD  Prenatal Multivit-Min-Fe-FA (PRENATAL VITAMINS PO) Take by mouth.    Historical Provider, MD  prochlorperazine (COMPAZINE) 10 MG tablet Take 1 tablet (10 mg total) by mouth every 6 (six) hours as needed (Nausea or vomiting). 11/15/14   Chauncey Cruel, MD  simvastatin (ZOCOR) 40 MG tablet Take 1 tablet (40 mg total) by mouth at bedtime. 08/20/14   Rachael Zapanta S Eulis Salazar, PA-C  tobramycin-dexamethasone (TOBRADEX) ophthalmic solution Place 1 drop into both eyes 2 (two) times daily. 11/15/14   Chauncey Cruel, MD  triamterene-hydrochlorothiazide (MAXZIDE-25) 37.5-25 MG per tablet TAKE 1 TABLET DAILY 08/20/14   Rashad Obeid S Dreamer Carillo, PA-C     No Known Allergies     Objective:  Physical Exam  Physical Exam  Constitutional: She is oriented to person, place, and time. She appears well-developed and well-nourished. No distress.  BP 112/80 mmHg  Pulse 72  Temp(Src) 98.3 F (36.8 C) (Oral)  Resp 16  Ht 5' 1.25" (1.556 m)  Wt 229 lb 6.4 oz (104.055 kg)  BMI 42.98 kg/m2  SpO2 97%   Eyes: Conjunctivae are normal. No scleral icterus.  Neck: No thyromegaly present.  Cardiovascular: Normal rate, regular rhythm, normal heart sounds and intact distal pulses.   Pulmonary/Chest: Effort normal and breath sounds normal.  Lymphadenopathy:    She has no cervical adenopathy.  Neurological: She is alert and oriented to person, place, and time.  Skin: Skin is warm and dry.  Psychiatric: She has a normal mood and affect. Her behavior is normal.           Assessment & Plan:   1. Essential hypertension Well-controlled. Continue current treatment.  2. Depression Well-controlled. Continue current treatment.  3. Osteoarthritis of both feet She uses a cane when the pain increases. Not interested in additional evaluation or treatment presently.  4. Hypothyroidism, unspecified hypothyroidism type Update labs at  next lab draw.  5. Breast cancer of lower-inner quadrant of right female breast Continue care with Dr. Jana Hakim.   Fara Chute, PA-C Physician Assistant-Certified [facility]

## 2014-12-02 ENCOUNTER — Other Ambulatory Visit: Payer: Medicare PPO

## 2014-12-02 ENCOUNTER — Ambulatory Visit (HOSPITAL_COMMUNITY)
Admission: RE | Admit: 2014-12-02 | Discharge: 2014-12-02 | Disposition: A | Discharge status: Home / Self Care | Payer: Medicare PPO | Source: Ambulatory Visit | Attending: Oncology | Admitting: Oncology

## 2014-12-02 ENCOUNTER — Ambulatory Visit: Payer: Medicare PPO

## 2014-12-02 ENCOUNTER — Telehealth: Payer: Self-pay | Admitting: *Deleted

## 2014-12-02 ENCOUNTER — Encounter: Payer: Self-pay | Admitting: Oncology

## 2014-12-02 ENCOUNTER — Other Ambulatory Visit (HOSPITAL_BASED_OUTPATIENT_CLINIC_OR_DEPARTMENT_OTHER): Payer: Medicare PPO

## 2014-12-02 ENCOUNTER — Other Ambulatory Visit (HOSPITAL_COMMUNITY): Payer: Self-pay | Admitting: Oncology

## 2014-12-02 ENCOUNTER — Encounter (HOSPITAL_COMMUNITY): Payer: Self-pay | Admitting: Emergency Medicine

## 2014-12-02 ENCOUNTER — Observation Stay (HOSPITAL_COMMUNITY)
Admission: EM | Admit: 2014-12-02 | Discharge: 2014-12-04 | Disposition: A | Discharge status: Home / Self Care | Payer: Medicare PPO | Attending: Family Medicine | Admitting: Family Medicine

## 2014-12-02 ENCOUNTER — Ambulatory Visit (HOSPITAL_BASED_OUTPATIENT_CLINIC_OR_DEPARTMENT_OTHER): Payer: Medicare PPO | Admitting: Oncology

## 2014-12-02 VITALS — BP 133/67 | HR 73 | Temp 98.3°F | Resp 18 | Ht 61.25 in | Wt 231.5 lb

## 2014-12-02 DIAGNOSIS — I82C12 Acute embolism and thrombosis of left internal jugular vein: Principal | ICD-10-CM | POA: Insufficient documentation

## 2014-12-02 DIAGNOSIS — M79602 Pain in left arm: Secondary | ICD-10-CM

## 2014-12-02 DIAGNOSIS — C50311 Malignant neoplasm of lower-inner quadrant of right female breast: Secondary | ICD-10-CM

## 2014-12-02 DIAGNOSIS — D72829 Elevated white blood cell count, unspecified: Secondary | ICD-10-CM | POA: Insufficient documentation

## 2014-12-02 DIAGNOSIS — I82C19 Acute embolism and thrombosis of unspecified internal jugular vein: Secondary | ICD-10-CM | POA: Insufficient documentation

## 2014-12-02 DIAGNOSIS — M7989 Other specified soft tissue disorders: Secondary | ICD-10-CM | POA: Diagnosis not present

## 2014-12-02 DIAGNOSIS — I82409 Acute embolism and thrombosis of unspecified deep veins of unspecified lower extremity: Secondary | ICD-10-CM | POA: Diagnosis present

## 2014-12-02 LAB — COMPREHENSIVE METABOLIC PANEL (CC13)
ALT: 29 U/L (ref 0–55)
AST: 22 U/L (ref 5–34)
Albumin: 3.3 g/dL — ABNORMAL LOW (ref 3.5–5.0)
Alkaline Phosphatase: 62 U/L (ref 40–150)
Anion Gap: 13 mEq/L — ABNORMAL HIGH (ref 3–11)
BUN: 13.2 mg/dL (ref 7.0–26.0)
CO2: 20 mEq/L — ABNORMAL LOW (ref 22–29)
Calcium: 9.3 mg/dL (ref 8.4–10.4)
Chloride: 111 mEq/L — ABNORMAL HIGH (ref 98–109)
Creatinine: 1.1 mg/dL (ref 0.6–1.1)
EGFR: 62 mL/min/{1.73_m2} — ABNORMAL LOW (ref >90)
Glucose: 216 mg/dl — ABNORMAL HIGH (ref 70–140)
POTASSIUM: 3.6 meq/L (ref 3.5–5.1)
Sodium: 144 mEq/L (ref 136–145)
TOTAL PROTEIN: 6.8 g/dL (ref 6.4–8.3)
Total Bilirubin: 0.36 mg/dL (ref 0.20–1.20)

## 2014-12-02 LAB — PROTIME-INR
INR: 1.03 (ref 0.00–1.49)
Prothrombin Time: 13.6 seconds (ref 11.6–15.2)

## 2014-12-02 LAB — CBC WITH DIFFERENTIAL/PLATELET
BASO%: 0.4 % (ref 0.0–2.0)
Basophils Absolute: 0.1 10*3/uL (ref 0.0–0.1)
EOS%: 0 % (ref 0.0–7.0)
Eosinophils Absolute: 0 10*3/uL (ref 0.0–0.5)
HCT: 46.8 % — ABNORMAL HIGH (ref 34.8–46.6)
HEMOGLOBIN: 14.6 g/dL (ref 11.6–15.9)
LYMPH#: 1.8 10*3/uL (ref 0.9–3.3)
LYMPH%: 6.7 % — ABNORMAL LOW (ref 14.0–49.7)
MCH: 27.2 pg (ref 25.1–34.0)
MCHC: 31.3 g/dL — AB (ref 31.5–36.0)
MCV: 86.9 fL (ref 79.5–101.0)
MONO#: 0.8 10*3/uL (ref 0.1–0.9)
MONO%: 3 % (ref 0.0–14.0)
NEUT#: 24 10*3/uL — ABNORMAL HIGH (ref 1.5–6.5)
NEUT%: 89.9 % — AB (ref 38.4–76.8)
PLATELETS: 310 10*3/uL (ref 145–400)
RBC: 5.38 10*6/uL (ref 3.70–5.45)
RDW: 14.6 % — ABNORMAL HIGH (ref 11.2–14.5)
WBC: 26.8 10*3/uL — ABNORMAL HIGH (ref 3.9–10.3)

## 2014-12-02 MED ORDER — RIVAROXABAN 15 MG PO TABS
15.0000 mg | ORAL_TABLET | Freq: Once | ORAL | Status: DC
  Filled 2014-12-02: qty 1

## 2014-12-02 MED ORDER — HEPARIN BOLUS VIA INFUSION
4000.0000 [IU] | Freq: Once | INTRAVENOUS | Status: AC
  Administered 2014-12-02: 4000 [IU] via INTRAVENOUS
  Filled 2014-12-02: qty 4000

## 2014-12-02 MED ORDER — HEPARIN (PORCINE) IN NACL 100-0.45 UNIT/ML-% IJ SOLN
1200.0000 [IU]/h | INTRAMUSCULAR | Status: DC
Start: 2014-12-02 — End: 2014-12-03
  Administered 2014-12-02: 1300 [IU]/h via INTRAVENOUS
  Filled 2014-12-02 (×2): qty 250

## 2014-12-02 NOTE — ED Notes (Signed)
Pt states pain in upper left arm since having port-a-cath placed at the end of march. Denies tingling or numbness in arm. No pain at this time.

## 2014-12-02 NOTE — Progress Notes (Signed)
Report attempted x2

## 2014-12-02 NOTE — Progress Notes (Signed)
Report attempted x 1

## 2014-12-02 NOTE — ED Notes (Signed)
Pt sent here after having positive ultrasound for blood clot in her upper left arm/neck; pt sts went for first chemo treatment today and sent for eval of left arm swelling

## 2014-12-02 NOTE — ED Notes (Signed)
Admitting physician at bedside

## 2014-12-02 NOTE — Progress Notes (Signed)
VASCULAR LAB PRELIMINARY  PRELIMINARY  PRELIMINARY  PRELIMINARY  BUEV completed.    Preliminary report: Left upper extremity venous duplex initially ordered, however, study revealed acute occluding DVT of the left internal jugular vein.  Remainder of the left upper extremity was negative for DVT.  Right upper extremity venous duplex was performed and was negative for DVT.  Patient taken to ED to be evaluated by physician.  August Albino, RVT 12/02/2014, 6:05 PM

## 2014-12-02 NOTE — Progress Notes (Signed)
Jordan [name]  [phone] [phone]     ID: Jordan [name] DOB: January 31, 1951  MR#: [id number]  MWN#:[id number]  Patient Care Team: Fara Chute, PA-C as PCP - General (Physician Assistant) [name], MD as Consulting Physician (General Surgery) Chauncey Cruel, MD as Consulting Physician (Oncology) Arloa Koh, MD as Consulting Physician (Radiation Oncology) Mauro Kaufmann, RN as Registered Nurse Rockwell Germany, RN as Registered Nurse Holley Bouche, NP as Nurse Practitioner (Nurse Practitioner) PCP: [name] OTHER MD: [name] MD  CHIEF COMPLAINT: Early stage triple negative breast cancer  CURRENT TREATMENT: adjuvant chemotherapy   BREAST CANCER HISTORY: From the original intake note:  "Jordan [name]" [facility] July [facility] 2015 showing breast density category B. A possible mass in the right breast was noted and on Jerry 20 03/05/2015 the patient underwent right diagnostic mammography with right ultrasonography. Spot compression views confirmed a slightly irregular mass in the lower inner quadrant of the right breast. This was not palpable by physical exam. Ultrasound showed a small hypoechoic mass measuring 5 mm in the area in question. Ultrasound of the right axilla was unremarkable.  Biopsy of the mass in question fibrin 05/06/2015 showed ([id number]) and invasive ductal carcinoma, grade 2, estrogen and progesterone receptor negative, with an MIB-1 of 41%, and no HER-2 amplification.  Bilateral breast MRI was obtained 10/04/2014. Results are pending.  The patient's subsequent history is as detailed below   INTERVAL HISTORY: Jordan [name] returns today for follow-up of her right-sided triple negative breast cancer accompanied by her husband [name]. She is scheduled to begin her first cycle of Taxotere and Cytoxan. Having ongoing pain to her left shoulder and upper arm status post  Port-A-Cath placement on 11/14/2014.  REVIEW OF SYSTEMS: Patient overall feels well today. Her biggest complaint is left shoulder and upper arm pain particularly when she moves her arm or if she leans against her shoulder. She is unable to do dishes, suite, or mop at her home. Reports that she can barely raise her left arm up. She feels as though her left arm is swollen and bigger than the right. She has not noticed any warmth or redness to her arm.  she denies fevers, chills, chest pain, shortness of breath, abdominal pain, nausea, vomiting. She has been able to obtain her anti-emetics that were prescribed for her. A detailed review of systems today was otherwise stable.  PAST MEDICAL HISTORY: Past Medical History  Diagnosis Date  . Hyperlipidemia     takes Zetia and Zocor daily  . Thyroid disease   . Nasal congestion   . Leg swelling   . Constipation   . Nausea   . Generalized headaches     due to allergies, sinus  . PONV (postoperative nausea and vomiting)     pt states she is very easy to sedate  . Hypertension     takes Maxzide and Metoprolol daily  . Pneumonia     walking about 6-7yr ago  . History of bronchitis     last time about 6-727yrago  . Hx of seasonal allergies     takes OTC allergy med nightly  . History of migraine     last one about 15+yrs ago  . Dizziness     r/t side effects from meds  . Vertigo     takes Meclizine prn  . Joint pain   . Back pain     buldging disc  .  GERD (gastroesophageal reflux disease)     takes Protonix as needed  . Hemorrhoids   . History of colon polyps   . Mass of colon   . Cancer     colon  . History of UTI   . Hypothyroidism     takes Synthroid daily  . Depression     takes Wellbutrin daily  . Wears glasses     PAST SURGICAL HISTORY: Past Surgical History  Procedure Laterality Date  . Cesarean section  1968, 1970  . Knee surgery  1998    right - arthroscopic  . Abdominal hysterectomy    . Exploratory laparotomy     . Carpal tunnel release    . Colonoscopy    . Esophagogastroduodenoscopy    . Partial colectomy  03/30/2012    Procedure: PARTIAL COLECTOMY;  Surgeon: [name], MD;  Location: [facility];  Service: General;  Laterality: N/A;  . Appendectomy    . Radioactive seed guided mastectomy with axillary sentinel lymph node biopsy Right 10/17/2014    Procedure: RADIOACTIVE SEED GUIDED PARTIAL MASTECTOMY WITH AXILLARY SENTINEL LYMPH NODE BIOPSY;  Surgeon: [name], MD;  Location: [facility];  Service: General;  Laterality: Right;    FAMILY HISTORY Family History  Problem Relation Age of Onset  . Cancer Father 32    prostate  . Cancer Brother 73    colon  . Deep vein thrombosis Brother   . Cancer Maternal Aunt 67    breast  . Cancer Maternal Grandmother 87    breast  . Cancer Paternal Grandmother     colon  . Pulmonary embolism Brother     long-distance truck driver (PE x 2)  . Cancer Brother 66    colon  . Diabetes Maternal Grandfather   . Cancer Maternal Aunt 75    unknown type  . Mental illness Sister     history of Depression  . Seizures Sister 8    s/p traumatic brain injury  . Cancer Maternal Uncle     prostate   the patient's parents are still living, as of February 2016. Her father is 60 years old, with a history of prostate cancer diagnosed in his late 54s. The patient's mother is 59 years old. The patient had 5 brothers, 3 sisters. One brother died with prostate cancer at age 31. Another brother has a history of prostate cancer, age 37. One brother had colon cancer diagnosed age 65 as did a paternal grandmother. A maternal grandmother had breast cancer at age 67 as did a maternal aunt (diagnosed age 62.) A cousin was diagnosed with breast cancer the age of 75.  GYNECOLOGIC HISTORY:  No LMP recorded. Patient has had a hysterectomy. Menarche age 53, first live birth age 73. The patient is GX P1. She stopped having periods in 1981 when she underwent hysterectomy  and unilateral salpingo-oophorectomy.. She did not take hormone replacement.  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 Her husband Jordan [name] owns a business that [employer] at night. The patient's biological son Jordan [name] works in Biomedical scientist in [place]. He has 2 children. One of them, the patient's granddaughter Jordan [name], 40 years old, lives with the patient, and is a Interior and spatial designer at [employer]. The patient also has an adopted son, Jordan [name], lives in [place]. Jordan [name] attends a city of refugee church in [place] where she grew up    ADVANCED DIRECTIVES: Not in place  HEALTH MAINTENANCE: History  Substance Use Topics  .  Smoking status: Never Smoker   . Smokeless tobacco: Never Used  . Alcohol Use: No     Colonoscopy: 03/30/2013/[facility]  PAP:  Bone density:  Lipid panel:  No Known Allergies  Current Outpatient Prescriptions  Medication Sig Dispense Refill  . buPROPion (WELLBUTRIN XL) 150 MG 24 hr tablet Take 1 tablet (150 mg total) by mouth daily. 30 tablet 5  . dexamethasone (DECADRON) 4 MG tablet Take 2 tablets (8 mg total) by mouth 2 (two) times daily. Start the day before Taxotere. Then again the day after chemo for 3 days. 30 tablet 1  . ezetimibe (ZETIA) 10 MG tablet Take 1 tablet (10 mg total) by mouth daily. 30 tablet 5  . levothyroxine (SYNTHROID, LEVOTHROID) 125 MCG tablet TAKE 1 TABLET BY MOUTH DAILY 90 tablet 1  . lidocaine-prilocaine (EMLA) cream Apply to affected area once 30 g 3  . loratadine (CLARITIN) 10 MG tablet Take 1 tablet (10 mg total) by mouth daily. 30 tablet 4  . LORazepam (ATIVAN) 0.5 MG tablet Take 1 tablet (0.5 mg total) by mouth at bedtime as needed (Nausea or vomiting). 30 tablet 0  . metoprolol succinate (TOPROL-XL) 25 MG 24 hr tablet Take 1 tablet (25 mg total) by mouth daily. 30 tablet 5  . ondansetron (ZOFRAN) 8 MG tablet Take 1 tablet (8 mg total) by mouth 2 (two) times daily. Start the day after chemo for 3 days.  Then take as needed for nausea or vomiting. 30 tablet 1  . oxyCODONE-acetaminophen (ROXICET) 5-325 MG per tablet Take 1-2 tablets by mouth every 4 (four) hours as needed. 50 tablet 0  . Prenatal Multivit-Min-Fe-FA (PRENATAL VITAMINS PO) Take by mouth.    . prochlorperazine (COMPAZINE) 10 MG tablet Take 1 tablet (10 mg total) by mouth every 6 (six) hours as needed (Nausea or vomiting). 30 tablet 1  . simvastatin (ZOCOR) 40 MG tablet Take 1 tablet (40 mg total) by mouth at bedtime. 30 tablet 5  . tobramycin-dexamethasone (TOBRADEX) ophthalmic solution Place 1 drop into both eyes 2 (two) times daily. 5 mL 0  . aspirin 81 MG tablet Take 81 mg by mouth daily.    . meclizine (ANTIVERT) 25 MG tablet Take 1 tablet (25 mg total) by mouth 4 (four) times daily as needed for dizziness. (Patient not taking: Reported on 12/02/2014) 30 tablet 1  . meloxicam (MOBIC) 15 MG tablet Take 1 tablet (15 mg total) by mouth daily. 30 tablet 1  . nitroGLYCERIN (NITROSTAT) 0.4 MG SL tablet Place 1 tablet (0.4 mg total) under the tongue every 5 (five) minutes as needed for chest pain. (Patient not taking: Reported on 12/02/2014) 30 tablet 1  . triamterene-hydrochlorothiazide (MAXZIDE-25) 37.5-25 MG per tablet TAKE 1 TABLET DAILY (Patient not taking: Reported on 12/02/2014) 30 tablet 12   No current facility-administered medications for this visit.   Facility-Administered Medications Ordered in Other Visits  Medication Dose Route Frequency Provider Last Rate Last Dose  . chlorhexidine (HIBICLENS) 4 % liquid 1 application  1 application Topical Once [name], MD      . chlorhexidine (HIBICLENS) 4 % liquid 1 application  1 application Topical Once [name], MD        OBJECTIVE: Middle-aged African-American woman who appears stated age 72 Vitals:   12/02/14 1119  BP: 133/67  Pulse: 73  Temp: 98.3 F (36.8 C)  Resp: 18     Body mass index is 43.37 kg/(m^2).    ECOG FS:1 - Symptomatic but completely  ambulatory  Sclerae unicteric, pupils round and equal Oropharynx clear and moist No cervical or supraclavicular adenopathy; port in the left upper anterior chest, bandage clean and dry Lungs no rales or rhonchi Heart regular rate and rhythm Abd soft, obese, nontender, positive bowel sounds MSK no focal spinal tenderness, no upper extremity lymphedema Neuro: nonfocal, well oriented, appropriate affect Breasts: Deferred    LAB RESULTS:  CMP     Component Value Date/Time   NA 144 12/02/2014 1055   NA 144 11/14/2014 0903   K 3.6 12/02/2014 1055   K 3.3* 11/14/2014 0903   CL 107 11/14/2014 0903   CO2 20* 12/02/2014 1055   CO2 27 11/14/2014 0903   GLUCOSE 216* 12/02/2014 1055   GLUCOSE 99 11/14/2014 0903   BUN 13.2 12/02/2014 1055   BUN 11 11/14/2014 0903   CREATININE 1.1 12/02/2014 1055   CREATININE 1.10 11/14/2014 0903   CREATININE 1.04 08/20/2014 1238   CALCIUM 9.3 12/02/2014 1055   CALCIUM 9.0 11/14/2014 0903   PROT 6.8 12/02/2014 1055   PROT 6.9 08/20/2014 1238   ALBUMIN 3.3* 12/02/2014 1055   ALBUMIN 3.9 08/20/2014 1238   AST 22 12/02/2014 1055   AST 23 08/20/2014 1238   ALT 29 12/02/2014 1055   ALT 24 08/20/2014 1238   ALKPHOS 62 12/02/2014 1055   ALKPHOS 54 08/20/2014 1238   BILITOT 0.36 12/02/2014 1055   BILITOT 0.6 08/20/2014 1238   GFRNONAA 51* 11/14/2014 0903   GFRAA 59* 11/14/2014 0903    INo results found for: SPEP, UPEP  Lab Results  Component Value Date   WBC 26.8* 12/02/2014   NEUTROABS 24.0* 12/02/2014   HGB 14.6 12/02/2014   HCT 46.8* 12/02/2014   MCV 86.9 12/02/2014   PLT 310 12/02/2014      Chemistry      Component Value Date/Time   NA 144 12/02/2014 1055   NA 144 11/14/2014 0903   K 3.6 12/02/2014 1055   K 3.3* 11/14/2014 0903   CL 107 11/14/2014 0903   CO2 20* 12/02/2014 1055   CO2 27 11/14/2014 0903   BUN 13.2 12/02/2014 1055   BUN 11 11/14/2014 0903   CREATININE 1.1 12/02/2014 1055   CREATININE 1.10 11/14/2014 0903    CREATININE 1.04 08/20/2014 1238      Component Value Date/Time   CALCIUM 9.3 12/02/2014 1055   CALCIUM 9.0 11/14/2014 0903   ALKPHOS 62 12/02/2014 1055   ALKPHOS 54 08/20/2014 1238   AST 22 12/02/2014 1055   AST 23 08/20/2014 1238   ALT 29 12/02/2014 1055   ALT 24 08/20/2014 1238   BILITOT 0.36 12/02/2014 1055   BILITOT 0.6 08/20/2014 1238       No results found for: LABCA2  No components found for: [id number]  No results for input(s): INR in the last 168 hours.  Urinalysis    Component Value Date/Time   COLORURINE YELLOW 03/09/2011 0857   APPEARANCEUR CLOUDY* 03/09/2011 0857   LABSPEC 1.022 03/09/2011 0857   PHURINE 5.5 03/09/2011 0857   GLUCOSEU NEGATIVE 03/09/2011 0857   HGBUR NEGATIVE 03/09/2011 0857   BILIRUBINUR SMALL* 03/09/2011 0857   KETONESUR NEGATIVE 03/09/2011 0857   PROTEINUR NEGATIVE 03/09/2011 0857   UROBILINOGEN 0.2 03/09/2011 0857   NITRITE NEGATIVE 03/09/2011 0857   LEUKOCYTESUR LARGE* 03/09/2011 0857    STUDIES: Ir Fluoro Guide Cv Line Left  11/14/2014   CLINICAL DATA:  64 year old female with right-sided breast cancer. She requires durable central venous access for chemotherapy. A left IJ approach port catheter  will be placed.  EXAM: IR LEFT FLOURO GUIDE CV LINE; IR ULTRASOUND GUIDANCE VASC ACCESS LEFT  Date: 11/14/2014  ANESTHESIA/SEDATION: Moderate (conscious) sedation was administered during this procedure. A total of 1.5 mg Versed and 75 mg Fentanyl were administered intravenously. The patient's vital signs were monitored continuously by radiology nursing throughout the course of the procedure.  Total sedation time: 36 minutes  FLUOROSCOPY TIME:  12 seconds  18 mGy  TECHNIQUE: The left neck and chest was prepped with chlorhexidine, and draped in the usual sterile fashion using maximum barrier technique (cap and mask, sterile gown, sterile gloves, large sterile sheet, hand hygiene and cutaneous antiseptic). Antibiotic prophylaxis was provided with 2g  Ancef administered IV one hour prior to skin incision. Local anesthesia was attained by infiltration with 1% lidocaine with epinephrine.  Ultrasound demonstrated patency of the left internal jugular vein, and this was documented with an image. Under real-time ultrasound guidance, this vein was accessed with a 21 gauge micropuncture needle and image documentation was performed. A small dermatotomy was made at the access site with an 11 scalpel. A 0.018" wire was advanced into the SVC and the access needle exchanged for a 65F micropuncture vascular sheath. The 0.018" wire was then removed and a 0.035" wire advanced into the IVC.  An appropriate location for the subcutaneous reservoir was selected below the clavicle and an incision was made through the skin and underlying soft tissues. The subcutaneous tissues were then dissected using a combination of blunt and sharp surgical technique and a pocket was formed. A single lumen power injectable portacatheter was then tunneled through the subcutaneous tissues from the pocket to the dermatotomy and the port reservoir placed within the subcutaneous pocket.  The venous access site was then serially dilated and a peel away vascular sheath placed over the wire. The wire was removed and the port catheter advanced into position under fluoroscopic guidance. The catheter tip is positioned in the upper right atrium. This was documented with a spot image. The portacatheter was then tested and found to flush and aspirate well. The port was flushed with saline followed by 100 units/mL heparinized saline.  The pocket was then closed in two layers using first subdermal inverted interrupted absorbable sutures followed by a running subcuticular suture. The epidermis was then sealed with Dermabond. The dermatotomy at the venous access site was also sealed with Dermabond.  COMPLICATIONS: None.  The patient tolerated the procedure well.  IMPRESSION: Successful placement of a left IJ approach  Power Port with ultrasound and fluoroscopic guidance. The catheter is ready for use.  Signed,  Criselda Peaches, MD  [facility]  [facility]   Electronically Signed   By: Jacqulynn Cadet M.D.   On: 11/14/2014 14:43   Ir US Guide Vasc Access Left  11/14/2014   CLINICAL DATA:  64 year old female with right-sided breast cancer. She requires durable central venous access for chemotherapy. A left IJ approach port catheter will be placed.  EXAM: IR LEFT FLOURO GUIDE CV LINE; IR ULTRASOUND GUIDANCE VASC ACCESS LEFT  Date: 11/14/2014  ANESTHESIA/SEDATION: Moderate (conscious) sedation was administered during this procedure. A total of 1.5 mg Versed and 75 mg Fentanyl were administered intravenously. The patient's vital signs were monitored continuously by radiology nursing throughout the course of the procedure.  Total sedation time: 36 minutes  FLUOROSCOPY TIME:  12 seconds  18 mGy  TECHNIQUE: The left neck and chest was prepped with chlorhexidine, and draped in the usual sterile fashion  using maximum barrier technique (cap and mask, sterile gown, sterile gloves, large sterile sheet, hand hygiene and cutaneous antiseptic). Antibiotic prophylaxis was provided with 2g Ancef administered IV one hour prior to skin incision. Local anesthesia was attained by infiltration with 1% lidocaine with epinephrine.  Ultrasound demonstrated patency of the left internal jugular vein, and this was documented with an image. Under real-time ultrasound guidance, this vein was accessed with a 21 gauge micropuncture needle and image documentation was performed. A small dermatotomy was made at the access site with an 11 scalpel. A 0.018" wire was advanced into the SVC and the access needle exchanged for a 5F micropuncture vascular sheath. The 0.018" wire was then removed and a 0.035" wire advanced into the IVC.  An appropriate location for the subcutaneous reservoir was selected below the  clavicle and an incision was made through the skin and underlying soft tissues. The subcutaneous tissues were then dissected using a combination of blunt and sharp surgical technique and a pocket was formed. A single lumen power injectable portacatheter was then tunneled through the subcutaneous tissues from the pocket to the dermatotomy and the port reservoir placed within the subcutaneous pocket.  The venous access site was then serially dilated and a peel away vascular sheath placed over the wire. The wire was removed and the port catheter advanced into position under fluoroscopic guidance. The catheter tip is positioned in the upper right atrium. This was documented with a spot image. The portacatheter was then tested and found to flush and aspirate well. The port was flushed with saline followed by 100 units/mL heparinized saline.  The pocket was then closed in two layers using first subdermal inverted interrupted absorbable sutures followed by a running subcuticular suture. The epidermis was then sealed with Dermabond. The dermatotomy at the venous access site was also sealed with Dermabond.  COMPLICATIONS: None.  The patient tolerated the procedure well.  IMPRESSION: Successful placement of a left IJ approach Power Port with ultrasound and fluoroscopic guidance. The catheter is ready for use.  Signed,  Criselda Peaches, MD  [facility]  [facility]   Electronically Signed   By: Jacqulynn Cadet M.D.   On: 11/14/2014 14:43    ASSESSMENT: 64 y.o.  [place] woman status post right breast lower inner quadrant biopsy 09/24/2014 for a clinical T1a N0, stage IA invasive ductal carcinoma, grade 1 or 2, estrogen and progesterone receptor negative, with an MIB-1 of 41%, and HER-2 negative   (1) status post right lumpectomy and axillary sentinel lymph node sampling 10/17/2014 for a pT1b pN0, stage IA  invasive ductal carcinoma, grade 3, repeat HER-2 again  negative.   (2) adjuvant chemotherapy to consist of cyclophosphamide and docetaxel given every 3 weeks 4 with Neulasta support   (3) adjuvant radiation to follow chemotherapy   (4) status post partial right colectomy with lymphadenectomy 03/30/2012 for a 2.7 cm tubular adenoma with high-grade dysplasia, no evidence of invasion, 0 of 11 lymph nodes involved ([id number])  (5) genetics testing sent 10/04/2014 through the [employer] gene panel /[employer] [employer] found no deleterious mutations in ATM, BARD1, BRCA1, BRCA2, BRIP1, CDH1, CHEK2, EPCAM, MLH1, MRE11A, MSH2, MSH6, MUTYH, NBN, NF1, PALB2, PMS2, PTEN, RAD50, RAD51C, RAD51D, SMARCA4, STK11, or TP53  (a) two variants of uncertain significance were found    (i) ATM, p.[id number]   (ii) SMARCA4, p.[id number] PLAN: The patient presents today to begin chemotherapy with Cytoxan and Taxotere. She understands 4 cycles of Cytoxan and Taxotere is about  the minimum we give with curative intent in patients like her. We we have again discussed the possible toxicities, side effects and complications of these agents.  We discussed her left arm pain and edema. When discussing further, she has a family history of 2 brothers who have had blood clots. Discussed my concerns about the possibility of a blood clot at her port. We discussed proceeding with a Doppler ultrasound today to evaluate this further. After much discussion with radiology as well as our infusion room, we will need to delay her chemotherapy until later this week as we cannot get a stat Doppler ultrasound and a timely manner and the infusion nurses do not feel confident that they can identify a peripheral vein to administer chemotherapy today. I discussed this with the patient and she is agreeable to delaying her chemotherapy until later this week. We will tentatively give her chemotherapy on Wednesday, April 20.  Once we have the results of the Doppler ultrasound, we will adjust all future dates of her  appointments. I have notified Dr. Jana Hakim of the above findings, proceeding with Doppler ultrasound, and delay of chemotherapy. We will follow-up with the patient with the results once they are available.   Jordan [name] has a good understanding of the overall plan. She agrees with it. She knows the goal of treatment in her case is cure. She will call with any problems that may develop before her next visit here.   Jordan Bussing, NP   12/02/2014 4:42 PM Medical Oncology and Hematology [facility] [address] [place], [place] [address] Tel. [phone]    Fax. [phone]

## 2014-12-02 NOTE — Progress Notes (Signed)
ANTICOAGULATION CONSULT NOTE - Initial Consult  Pharmacy Consult for Heparin Indication: DVT  No Known Allergies  Patient Measurements:   Heparin Dosing Weight: 74 kg  Vital Signs: Temp: 98.1 F (36.7 C) (04/18 1836) Temp Source: Oral (04/18 1836) BP: 138/84 mmHg (04/18 2115) Pulse Rate: 70 (04/18 2115)  Labs:  Recent Labs  12/02/14 1055 12/02/14 1900  HGB 14.6  --   HCT 46.8*  --   PLT 310  --   LABPROT  --  13.6  INR  --  1.03  CREATININE 1.1  --     Estimated Creatinine Clearance: 56.4 mL/min (by C-G formula based on Cr of 1.1).   Medical History: Past Medical History  Diagnosis Date  . Hyperlipidemia     takes Zetia and Zocor daily  . Thyroid disease   . Nasal congestion   . Leg swelling   . Constipation   . Nausea   . Generalized headaches     due to allergies, sinus  . PONV (postoperative nausea and vomiting)     pt states she is very easy to sedate  . Hypertension     takes Maxzide and Metoprolol daily  . Pneumonia     walking about 6-41yrs ago  . History of bronchitis     last time about 6-49yrs ago  . Hx of seasonal allergies     takes OTC allergy med nightly  . History of migraine     last one about 15+yrs ago  . Dizziness     r/t side effects from meds  . Vertigo     takes Meclizine prn  . Joint pain   . Back pain     buldging disc  . GERD (gastroesophageal reflux disease)     takes Protonix as needed  . Hemorrhoids   . History of colon polyps   . Mass of colon   . Cancer     colon  . History of UTI   . Hypothyroidism     takes Synthroid daily  . Depression     takes Wellbutrin daily  . Wears glasses     Medications:   (Not in a hospital admission) Scheduled:  Infusions:   Assessment: 64yo female with history of colon and R-sided breast cancer presents with arm swelling. Pharmacy is consulted to dose heparin for DVT treatment. CBC is wnl, sCr 1.1, INR 1.03, and duplux revealed acute occluding DVT of L internal jugular  vein.   Goal of Therapy:  Heparin level 0.3-0.7 units/ml Monitor platelets by anticoagulation protocol: Yes   Plan:  Give 4000 units bolus x 1 Start heparin infusion at 1300 units/hr Check anti-Xa level in 6 hours and daily while on heparin Continue to monitor H&H and platelets  F/u on long-term anticoag  Andrey Cota. Diona Foley, PharmD Clinical Pharmacist Pager [phone] 12/02/2014,9:44 PM

## 2014-12-02 NOTE — ED Provider Notes (Signed)
CSN: [id number]     Arrival date & time 12/02/14  1753 History   First MD Initiated Contact with Patient 12/02/14 2103     Chief Complaint  Patient presents with  . Arm Swelling     (Consider location/radiation/quality/duration/timing/severity/associated sxs/prior Treatment) HPI Comments: Jordan [name] is a 64 y.o. female with a PMHx of remote colon cancer, recent R sided breast cancer s/p lumpectomy on 10/17/14 and port-a-cath placement on 11/14/14 and scheduled to start chemo today (did not receive meds due to acute condition), HLD, hypothyroidism, migraines, hemorrhoids, GERD, dizziness/vertigo, and HTN, who presents to the ED with complaints of left upper extremity swelling that began gradually after her Port-A-Cath was placed on 11/14/14. She noticed that she had left shoulder/neck pain with activity after her Port-a-cath was placed, which is currently resolved, but at her appointment with her oncologist they noticed LUE swelling/tightness which led them to getting a DVT study which showed a L internal jugular vein DVT, at which time she was sent to the ER for further evaluation. She has a family history of DVT in 2 brothers, but none in herself prior to today. She denies any fevers, chills, chest pain, shortness breath, cough, hemoptysis, lower extremity swelling, abdominal pain, vomiting, diarrhea, cuts, melena, hematochezia, dysuria, hematuria, vaginal bleeding or discharge, numbness, tingling, weakness, jaw pain, or back pain.  Patient is a 64 y.o. female presenting with extremity pain. The history is provided by the patient and medical records. No language interpreter was used.  Extremity Pain This is a new problem. The current episode started 1 to 4 weeks ago. The problem occurs intermittently. The problem has been unchanged. Associated symptoms include myalgias (L shoulder) and nausea (mild, x1wk, no acute changes). Pertinent negatives include no abdominal pain, arthralgias, chest pain,  chills, coughing, fever, joint swelling, neck pain, numbness, vomiting or weakness. Exacerbated by: L arm/shoulder movement. She has tried rest for the symptoms. The treatment provided moderate relief.    Past Medical History  Diagnosis Date  . Hyperlipidemia     takes Zetia and Zocor daily  . Thyroid disease   . Nasal congestion   . Leg swelling   . Constipation   . Nausea   . Generalized headaches     due to allergies, sinus  . PONV (postoperative nausea and vomiting)     pt states she is very easy to sedate  . Hypertension     takes Maxzide and Metoprolol daily  . Pneumonia     walking about 6-31yr ago  . History of bronchitis     last time about 6-762yrago  . Hx of seasonal allergies     takes OTC allergy med nightly  . History of migraine     last one about 15+yrs ago  . Dizziness     r/t side effects from meds  . Vertigo     takes Meclizine prn  . Joint pain   . Back pain     buldging disc  . GERD (gastroesophageal reflux disease)     takes Protonix as needed  . Hemorrhoids   . History of colon polyps   . Mass of colon   . Cancer     colon  . History of UTI   . Hypothyroidism     takes Synthroid daily  . Depression     takes Wellbutrin daily  . Wears glasses    Past Surgical History  Procedure Laterality Date  . Cesarean section  1968, 1970  .  Knee surgery  1998    right - arthroscopic  . Abdominal hysterectomy    . Exploratory laparotomy    . Carpal tunnel release    . Colonoscopy    . Esophagogastroduodenoscopy    . Partial colectomy  03/30/2012    Procedure: PARTIAL COLECTOMY;  Surgeon: [name], MD;  Location: [facility];  Service: General;  Laterality: N/A;  . Appendectomy    . Radioactive seed guided mastectomy with axillary sentinel lymph node biopsy Right 10/17/2014    Procedure: RADIOACTIVE SEED GUIDED PARTIAL MASTECTOMY WITH AXILLARY SENTINEL LYMPH NODE BIOPSY;  Surgeon: [name], MD;  Location: [facility];  Service:  General;  Laterality: Right;   Family History  Problem Relation Age of Onset  . Cancer Father 64    prostate  . Cancer Brother 22    colon  . Deep vein thrombosis Brother   . Cancer Maternal Aunt 53    breast  . Cancer Maternal Grandmother 35    breast  . Cancer Paternal Grandmother     colon  . Pulmonary embolism Brother     long-distance truck driver (PE x 2)  . Cancer Brother 77    colon  . Diabetes Maternal Grandfather   . Cancer Maternal Aunt 75    unknown type  . Mental illness Sister     history of Depression  . Seizures Sister 8    s/p traumatic brain injury  . Cancer Maternal Uncle     prostate   History  Substance Use Topics  . Smoking status: Never Smoker   . Smokeless tobacco: Never Used  . Alcohol Use: No   OB History    Gravida Para Term Preterm AB TAB SAB Ectopic Multiple Living   '3 2 1 1 1     1     ' Review of Systems  Constitutional: Negative for fever and chills.  Respiratory: Negative for cough and shortness of breath.   Cardiovascular: Negative for chest pain and leg swelling.  Gastrointestinal: Positive for nausea (mild, x1wk, no acute changes). Negative for vomiting, abdominal pain, diarrhea, constipation and blood in stool.  Genitourinary: Negative for dysuria, hematuria, vaginal bleeding and vaginal discharge.  Musculoskeletal: Positive for myalgias (L shoulder). Negative for joint swelling, arthralgias, neck pain and neck stiffness.       +LUE tightness/swelling  Skin: Negative for color change.  Allergic/Immunologic: Positive for immunocompromised state (breast cancer pt).  Neurological: Negative for syncope, weakness and numbness.  Hematological: Does not bruise/bleed easily.  Psychiatric/Behavioral: Negative for confusion.   10 Systems reviewed and are negative for acute change except as noted in the HPI.    Allergies  Review of patient's allergies indicates no known allergies.  Home Medications   Prior to Admission medications    Medication Sig Start Date End Date Taking? Authorizing Provider  buPROPion (WELLBUTRIN XL) 150 MG 24 hr tablet Take 1 tablet (150 mg total) by mouth daily. 09/11/14  Yes Chelle S Jeffery, PA-C  ezetimibe (ZETIA) 10 MG tablet Take 1 tablet (10 mg total) by mouth daily. 08/20/14  Yes Chelle S Jeffery, PA-C  levothyroxine (SYNTHROID, LEVOTHROID) 125 MCG tablet TAKE 1 TABLET BY MOUTH DAILY 09/11/14  Yes Chelle S Jeffery, PA-C  lidocaine-prilocaine (EMLA) cream Apply to affected area once 11/15/14  Yes Chauncey Cruel, MD  loratadine (CLARITIN) 10 MG tablet Take 1 tablet (10 mg total) by mouth daily. 11/15/14  Yes Chauncey Cruel, MD  LORazepam (ATIVAN) 0.5 MG tablet  Take 1 tablet (0.5 mg total) by mouth at bedtime as needed (Nausea or vomiting). 11/15/14  Yes Chauncey Cruel, MD  meloxicam (MOBIC) 15 MG tablet Take 1 tablet (15 mg total) by mouth daily. 08/20/14  Yes Chelle S Jeffery, PA-C  metoprolol succinate (TOPROL-XL) 25 MG 24 hr tablet Take 1 tablet (25 mg total) by mouth daily. 08/20/14  Yes Chelle S Jeffery, PA-C  nitroGLYCERIN (NITROSTAT) 0.4 MG SL tablet Place 1 tablet (0.4 mg total) under the tongue every 5 (five) minutes as needed for chest pain. 11/23/12  Yes Chelle S Jeffery, PA-C  ondansetron (ZOFRAN) 8 MG tablet Take 1 tablet (8 mg total) by mouth 2 (two) times daily. Start the day after chemo for 3 days. Then take as needed for nausea or vomiting. 11/15/14  Yes Chauncey Cruel, MD  Prenatal Multivit-Min-Fe-FA (PRENATAL VITAMINS PO) Take by mouth.   Yes Historical Provider, MD  prochlorperazine (COMPAZINE) 10 MG tablet Take 1 tablet (10 mg total) by mouth every 6 (six) hours as needed (Nausea or vomiting). 11/15/14  Yes Chauncey Cruel, MD  simvastatin (ZOCOR) 40 MG tablet Take 1 tablet (40 mg total) by mouth at bedtime. 08/20/14  Yes Chelle S Jeffery, PA-C  tobramycin-dexamethasone (TOBRADEX) ophthalmic solution Place 1 drop into both eyes 2 (two) times daily. 11/15/14  Yes Chauncey Cruel, MD    aspirin 81 MG tablet Take 81 mg by mouth daily.    Historical Provider, MD  dexamethasone (DECADRON) 4 MG tablet Take 2 tablets (8 mg total) by mouth 2 (two) times daily. Start the day before Taxotere. Then again the day after chemo for 3 days. 11/15/14   Chauncey Cruel, MD  meclizine (ANTIVERT) 25 MG tablet Take 1 tablet (25 mg total) by mouth 4 (four) times daily as needed for dizziness. Patient not taking: Reported on 12/02/2014 08/20/14   Fara Chute, PA-C  oxyCODONE-acetaminophen (ROXICET) 5-325 MG per tablet Take 1-2 tablets by mouth every 4 (four) hours as needed. Patient not taking: Reported on 12/02/2014 10/17/14   [name], MD  triamterene-hydrochlorothiazide [facility]) 37.5-25 MG per tablet TAKE 1 TABLET DAILY Patient not taking: Reported on 12/02/2014 08/20/14   Chelle S Jeffery, PA-C   BP 111/77 mmHg  Pulse 72  Temp(Src) 98.1 F (36.7 C) (Oral)  Resp 18  SpO2 100% Physical Exam  Constitutional: She is oriented to person, place, and time. Vital signs are normal. She appears well-developed and well-nourished.  Non-toxic appearance. No distress.  Afebrile, nontoxic, NAD  HENT:  Head: Normocephalic and atraumatic.  Mouth/Throat: Oropharynx is clear and moist and mucous membranes are normal.  Eyes: Conjunctivae and EOM are normal. Right eye exhibits no discharge. Left eye exhibits no discharge.  Neck: Normal range of motion. Neck supple. No JVD present. Carotid bruit is not present.  No carotid bruit or JVD  Cardiovascular: Normal rate, regular rhythm, normal heart sounds and intact distal pulses.  Exam reveals no gallop and no friction rub.   No murmur heard. RRR, nl s1/s2, no m/r/g, distal pulses intact in all extremities, no pedal or BUE edema   Pulmonary/Chest: Effort normal and breath sounds normal. No respiratory distress. She has no decreased breath sounds. She has no wheezes. She has no rhonchi. She has no rales. She exhibits tenderness. She exhibits no crepitus and  no retraction.    CTAB in all lung fields, no w/r/r, no hypoxia or increased WOB, speaking in full sentences, SpO2 100% on RA  Mild tenderness to  L anterior chest wall nearing the base of the neck, near her port-a-cath location which is well healing.  Abdominal: Soft. Normal appearance and bowel sounds are normal. She exhibits no distension. There is no tenderness. There is no rigidity, no rebound and no guarding.  Musculoskeletal: Normal range of motion.  L shoulder with FROM intact although slightly painful with ROM movements, no bony TTP, no muscular TTP or spasms, no swelling/effusion, no crepitus/deformity, neg pain with resisted int/ext rotation. Strength and sensation grossly intact in all extremities, distal pulses intact. No pedal or BUE edema noted. No skin changes to LUE.  Neurological: She is alert and oriented to person, place, and time. She has normal strength. No sensory deficit.  Skin: Skin is warm, dry and intact. No rash noted.  Psychiatric: She has a normal mood and affect.  Nursing note and vitals reviewed.   ED Course  Procedures (including critical care time) Labs Review Labs Reviewed  PROTIME-INR   Results for orders placed or performed during the hospital encounter of 12/02/14  Protime-INR  Result Value Ref Range   Prothrombin Time 13.6 11.6 - 15.2 seconds   INR 1.03 0.00 - 1.49  Results for Jordan, [name] (MRN [id number]) as of 12/02/2014 21:22  Ref. Range 12/02/2014 10:55 12/02/2014 19:00  Sodium Latest Ref Range: 136-145 mEq/L 144   Potassium Latest Ref Range: 3.5-5.1 mEq/L 3.6   Chloride Latest Ref Range: 98-109 mEq/L 111 (H)   CO2 Latest Ref Range: 22-29 mEq/L 20 (L)   BUN Latest Ref Range: 7.0-26.0 mg/dL 13.2   Creatinine Latest Ref Range: 0.6-1.1 mg/dL 1.1   Calcium Latest Ref Range: 8.4-10.4 mg/dL 9.3   Glucose Latest Ref Range: 70-140 mg/dl 216 (H)   Anion gap Latest Ref Range: 3-11 mEq/L 13 (H)   EGFR Latest Ref Range: >90 ml/min/1.73 m2 62 (L)    Alkaline Phosphatase Latest Ref Range: 40-150 U/L 62   Albumin Latest Ref Range: 3.5-5.0 g/dL 3.3 (L)   AST Latest Ref Range: 5-34 U/L 22   ALT Latest Ref Range: 0-55 U/L 29   Total Protein Latest Ref Range: 6.4-8.3 g/dL 6.8   Total Bilirubin Latest Ref Range: 0.20-1.20 mg/dL 0.36   WBC Latest Ref Range: 3.9-10.3 10e3/uL 26.8 (H)   RBC Latest Ref Range: 3.70-5.45 10e6/uL 5.38   Hemoglobin Latest Ref Range: 11.6-15.9 g/dL 14.6   HCT Latest Ref Range: 34.8-46.6 % 46.8 (H)   MCV Latest Ref Range: 79.5-101.0 fL 86.9   MCH Latest Ref Range: 25.1-34.0 pg 27.2   MCHC Latest Ref Range: 31.5-36.0 g/dL 31.3 (L)   RDW Latest Ref Range: 11.2-14.5 % 14.6 (H)   Platelets Latest Ref Range: 145-400 10e3/uL 310   NEUT% Latest Ref Range: 38.4-76.8 % 89.9 (H)   LYMPH% Latest Ref Range: 14.0-49.7 % 6.7 (L)   MONO% Latest Ref Range: 0.0-14.0 % 3.0   EOS% Latest Ref Range: 0.0-7.0 % 0.0   BASO% Latest Ref Range: 0.0-2.0 % 0.4   NEUT# Latest Ref Range: 1.5-6.5 10e3/uL 24.0 (H)   MONO# Latest Ref Range: 0.1-0.9 10e3/uL 0.8   Eosinophils Absolute Latest Ref Range: 0.0-0.5 10e3/uL 0.0   Basophils Absolute Latest Ref Range: 0.0-0.1 10e3/uL 0.1   lymph# Latest Ref Range: 0.9-3.3 10e3/uL 1.8   Prothrombin Time Latest Ref Range: 11.6-15.2 seconds  13.6  INR Latest Ref Range: 0.00-1.49   1.03    Imaging Review No results found. VASCULAR LAB PRELIMINARY PRELIMINARY PRELIMINARY PRELIMINARY  BUEV completed.   Preliminary report: Left upper extremity venous  duplex initially ordered, however, study revealed acute occluding DVT of the left internal jugular vein. Remainder of the left upper extremity was negative for DVT. Right upper extremity venous duplex was performed and was negative for DVT. Patient taken to ED to be evaluated by physician.  August Albino, RVT 12/02/2014, 6:05 PM    EKG Interpretation None      MDM   Final diagnoses:  Internal jugular vein thrombosis, left    64 y.o.  female here with L internal jugular vein DVT. INR drawn earlier which was WNL. Cr 1.1 on earlier labs. No CP or SOB, no clinically apparent swelling. Slight pain with L shoulder movement but no bony TTP. Will proceed with heparin and admission for observation.  9:50 PM Family medicine team at bedside for admission. Please see their note for further documentation of care.  BP 138/84 mmHg  Pulse 70  Temp(Src) 98.1 F (36.7 C) (Oral)  Resp 17  SpO2 100%  Meds ordered this encounter  Medications  . heparin ADULT infusion 100 units/mL (25000 units/250 mL)    Sig:   . heparin bolus via infusion 4,000 Units    Sig:      Clementine Soulliere Camprubi-Soms, PA-C 12/02/14 2151  Noemi Chapel, MD 12/03/14 [phone]

## 2014-12-02 NOTE — ED Provider Notes (Signed)
65 year old female, recently diagnosed with breast cancer, approximately 2-1/2 weeks ago had an internal jugular approach IV access to the left upper chest wall with a Port-A-Cath, today she went to start on chemotherapy but was complaining of left-sided neck pain, nursing thought that the patient had a symmetrical arms and has ordered an ultrasound, lab work was rather unremarkable, ultrasound shows a completely occluding left internal jugular vein near where the catheter site is. On exam the patient has tenderness around the left neck, she does not appear toxic, she is not tachycardic, she is not hypoxic. Her heart and lung sounds are normal, she is not having chest pain or shortness of breath. Due to the patient's pathology with a complete occlusive left internal jugular vein she will be admitted to the hospital for anticoagulation overnight, no indication for CT angiogram at this time.  Care was discussed with the Admitting resident team who will admit for ongoing anticoagulation due to the "complete occlusion" of the IJ with a catheter in it.  Medical screening examination/treatment/procedure(s) were conducted as a shared visit with non-physician practitioner(s) and myself.  I personally evaluated the patient during the encounter.  Clinical Impression:   Final diagnoses:  Internal jugular vein thrombosis, left         Noemi Chapel, MD 12/03/14 [phone]

## 2014-12-02 NOTE — H&P (Signed)
[facility] Admission History and Physical Service Pager: [phone]  Patient name: Jordan [name] Medical record number: [id number] Date of birth: [birth year] Age: 64 y.o. Gender: female  Primary Care Provider: JEFFERY,CHELLE, PA-C Consultants: IR Code Status: Full  Chief Complaint: DVT  Assessment and Plan: Jordan [name] is a 64 y.o. female presenting with left upper extremity swelling and pain, doppler with DVT of left internal jugular vein. PMH is significant for right sided breast cancer s/p lumpectomy 10/17/14, HLD, hypothyroidism, migraines, hemorrhoids, GERD, dizziness/vertigo, HTN, partial right colectomy 2013 for tubular adenoma and high-grade dysplasia  # DVT: Doppler prior to hospitalization with occlusion of left internal jugular vein. History of Port-A-Cath placement 11/14/14. Family history of blood clots. Increased risk with breast cancer. WBC 26.8. HR normal, satting 100% on RA - Admitted to [facility] Medicine Teaching Service, Mingo Amber attending - Follow up BMP and CBC in am - Will obtain lactic acid due to leukocytosis and AG of 13 - Monitor INR, heparin level - Heparin drip per pharmacy - Holding Aspirin. States she was told not to take it due to her breast cancer and chemotherapy. - Contact IR in am. Appreciate recommendations.  # Breast Cancer: Bilaterally screening mammography in 06/2014 with category B breast density and possible mass. Right diagnostic mammography and [place] in January 2016 showed slightly irregular mass in lower inner quadrant of right breast. [place] with small hypoechoic mass 4mm. Had right lumpectomy and axillary sentinel lymph node sampling on 10/17/14 and biopsy showed  invasive ductal carcinoma, grade 2, estrogen and progesterone receptor negative.  Port-A-Cath placed 11/14/14. Was scheduled for first doses of Taxotere and Cytoxan on 4/18 but was sent to ED. Radiation to follow chemotherapy. Chemotherapy delayed until 4/20  #  Hyperglycemia: No history of DM. Glucose 216 at admission. - Follow up A1C - Sensitive Insulin Sliding Scale - Continue to monitor  # Hypertension: BP 111-138 / 77-84. Home medication includes Metoprolol 25mg . States HCTZ was discontinued. - Continue home medication  # Depression:  Home medication includes Bupropion 150mg  daily, Ativan 0.5mg  at bedtime PRN - Continue home medications   # Hyperlipidemia:  Home medications include Zetia 10mg , Simvastatin 40mg  - Continue home medications  # Hypothyroidism: Home medications include Synthroid 174mcg. TSH 2.835 in January 2016 - Continue home medications  FEN/GI: Regular Diet Prophylaxis: Heparin Drip  Disposition: Admitted to [facility] Medicine Teaching Service, Mingo Amber attending  History of Present Illness: Jordan [name] is a 64 y.o. female presenting with left arm swelling and tenderness. History of Port-A-Cath placement 11/14/14 and edema has been slowly worsening since that date. Initially believed pain and swelling were normal after Port-A-Cath placement.  Initially presented to the [facility] for her first round of Chemotherapy for early stage triple negative breast cancer. Complained of left shoulder and upper arm pain with movement of left arm. Complained of left arm swelling. Denied fevers, chills, CP, shortness of breath. DVT study showed left internal jugular vein DVT and she was sent to ED for further evaluation and treatment. Note family history of DVT in two brothers.  Review Of Systems: Per HPI  Otherwise 12 point review of systems was performed and was unremarkable.  Patient Active Problem List   Diagnosis Date Noted  . DVT (deep venous thrombosis) 12/02/2014  . Osteoarthritis of both feet 11/26/2014  . Poor venous access   . Colorectal cancer 10/03/2014  . Breast cancer of lower-inner quadrant of right female breast 09/27/2014  . Severe obesity (BMI >=  40) 11/06/2013  . Anemia, iron deficiency 04/25/2013   . GERD (gastroesophageal reflux disease) 02/12/2013  . HTN (hypertension) 10/21/2011  . Hypothyroidism 10/21/2011  . Depression 10/21/2011  . Hyperlipidemia 10/21/2011  . Lung nodules 10/21/2011  . Vertigo 10/21/2011  . Vitamin D deficiency 10/21/2011   Past Medical History: Past Medical History  Diagnosis Date  . Hyperlipidemia     takes Zetia and Zocor daily  . Thyroid disease   . Nasal congestion   . Leg swelling   . Constipation   . Nausea   . Generalized headaches     due to allergies, sinus  . PONV (postoperative nausea and vomiting)     pt states she is very easy to sedate  . Hypertension     takes Maxzide and Metoprolol daily  . Pneumonia     walking about 6-41yrs ago  . History of bronchitis     last time about 6-65yrs ago  . Hx of seasonal allergies     takes OTC allergy med nightly  . History of migraine     last one about 15+yrs ago  . Dizziness     r/t side effects from meds  . Vertigo     takes Meclizine prn  . Joint pain   . Back pain     buldging disc  . GERD (gastroesophageal reflux disease)     takes Protonix as needed  . Hemorrhoids   . History of colon polyps   . Mass of colon   . Cancer     colon  . History of UTI   . Hypothyroidism     takes Synthroid daily  . Depression     takes Wellbutrin daily  . Wears glasses    Past Surgical History: Past Surgical History  Procedure Laterality Date  . Cesarean section  1968, 1970  . Knee surgery  1998    right - arthroscopic  . Abdominal hysterectomy    . Exploratory laparotomy    . Carpal tunnel release    . Colonoscopy    . Esophagogastroduodenoscopy    . Partial colectomy  03/30/2012    Procedure: PARTIAL COLECTOMY;  Surgeon: [name], MD;  Location: [place];  Service: General;  Laterality: N/A;  . Appendectomy    . Radioactive seed guided mastectomy with axillary sentinel lymph node biopsy Right 10/17/2014    Procedure: RADIOACTIVE SEED GUIDED PARTIAL MASTECTOMY WITH AXILLARY  SENTINEL LYMPH NODE BIOPSY;  Surgeon: [name], MD;  Location: [place];  Service: General;  Laterality: Right;   Social History: History  Substance Use Topics  . Smoking status: Never Smoker   . Smokeless tobacco: Never Used  . Alcohol Use: No   Please also refer to relevant sections of EMR.  Family History: Family History  Problem Relation Age of Onset  . Cancer Father 63    prostate  . Cancer Brother 44    colon  . Deep vein thrombosis Brother   . Cancer Maternal Aunt 61    breast  . Cancer Maternal Grandmother 82    breast  . Cancer Paternal Grandmother     colon  . Pulmonary embolism Brother     long-distance truck driver (PE x 2)  . Cancer Brother 1    colon  . Diabetes Maternal Grandfather   . Cancer Maternal Aunt 75    unknown type  . Mental illness Sister     history of Depression  . Seizures Sister 8  s/p traumatic brain injury  . Cancer Maternal Uncle     prostate   Allergies and Medications: No Known Allergies Current Facility-Administered Medications on File Prior to Encounter  Medication Dose Route Frequency Provider Last Rate Last Dose  . chlorhexidine (HIBICLENS) 4 % liquid 1 application  1 application Topical Once [name], MD      . chlorhexidine (HIBICLENS) 4 % liquid 1 application  1 application Topical Once [name], MD       Current Outpatient Prescriptions on File Prior to Encounter  Medication Sig Dispense Refill  . buPROPion (WELLBUTRIN XL) 150 MG 24 hr tablet Take 1 tablet (150 mg total) by mouth daily. 30 tablet 5  . ezetimibe (ZETIA) 10 MG tablet Take 1 tablet (10 mg total) by mouth daily. 30 tablet 5  . levothyroxine (SYNTHROID, LEVOTHROID) 125 MCG tablet TAKE 1 TABLET BY MOUTH DAILY 90 tablet 1  . lidocaine-prilocaine (EMLA) cream Apply to affected area once 30 g 3  . loratadine (CLARITIN) 10 MG tablet Take 1 tablet (10 mg total) by mouth daily. 30 tablet 4  . LORazepam (ATIVAN) 0.5 MG tablet Take 1  tablet (0.5 mg total) by mouth at bedtime as needed (Nausea or vomiting). 30 tablet 0  . meloxicam (MOBIC) 15 MG tablet Take 1 tablet (15 mg total) by mouth daily. 30 tablet 1  . metoprolol succinate (TOPROL-XL) 25 MG 24 hr tablet Take 1 tablet (25 mg total) by mouth daily. 30 tablet 5  . nitroGLYCERIN (NITROSTAT) 0.4 MG SL tablet Place 1 tablet (0.4 mg total) under the tongue every 5 (five) minutes as needed for chest pain. 30 tablet 1  . ondansetron (ZOFRAN) 8 MG tablet Take 1 tablet (8 mg total) by mouth 2 (two) times daily. Start the day after chemo for 3 days. Then take as needed for nausea or vomiting. 30 tablet 1  . Prenatal Multivit-Min-Fe-FA (PRENATAL VITAMINS PO) Take by mouth.    . prochlorperazine (COMPAZINE) 10 MG tablet Take 1 tablet (10 mg total) by mouth every 6 (six) hours as needed (Nausea or vomiting). 30 tablet 1  . simvastatin (ZOCOR) 40 MG tablet Take 1 tablet (40 mg total) by mouth at bedtime. 30 tablet 5  . tobramycin-dexamethasone (TOBRADEX) ophthalmic solution Place 1 drop into both eyes 2 (two) times daily. 5 mL 0  . aspirin 81 MG tablet Take 81 mg by mouth daily.    Marland Kitchen dexamethasone (DECADRON) 4 MG tablet Take 2 tablets (8 mg total) by mouth 2 (two) times daily. Start the day before Taxotere. Then again the day after chemo for 3 days. 30 tablet 1  . meclizine (ANTIVERT) 25 MG tablet Take 1 tablet (25 mg total) by mouth 4 (four) times daily as needed for dizziness. (Patient not taking: Reported on 12/02/2014) 30 tablet 1  . oxyCODONE-acetaminophen (ROXICET) 5-325 MG per tablet Take 1-2 tablets by mouth every 4 (four) hours as needed. (Patient not taking: Reported on 12/02/2014) 50 tablet 0  . triamterene-hydrochlorothiazide (MAXZIDE-25) 37.5-25 MG per tablet TAKE 1 TABLET DAILY (Patient not taking: Reported on 12/02/2014) 30 tablet 12    Objective: BP 138/84 mmHg  Pulse 70  Temp(Src) 98.1 F (36.7 C) (Oral)  Resp 17  SpO2 100% Exam: General: 64yo female in no apparent  distress HEENT: Moist mucous membranes, Atraumatic, Normocephalic, PERRLA Cardiovascular: S1 and S2 noted, no murmurs, regular rate and rhythm, tenderness over left chest Respiratory: Clear to auscultation bilaterally, no wheezes, no increased work of breathing Abdomen: Soft  and nondistended, bowel sounds noted, no tenderness or masses to palpation Extremities: mild nonpitting edema noted in lower extremities Skin: no rashes noted, dry Neuro: No focal deficits noted, CN II-XII intact, muscle strength in upper and lower extremities 5/5  Labs and Imaging: CBC BMET   Recent Labs Lab 12/02/14 1055  WBC 26.8*  HGB 14.6  HCT 46.8*  PLT 310    Recent Labs Lab 12/02/14 1055  NA 144  K 3.6  CO2 20*  BUN 13.2  CREATININE 1.1  GLUCOSE 216*  CALCIUM 9.3     [address] [place], DO 12/02/2014, 11:12 PM PGY-1, [place] Intern pager: [phone], text pages welcome  FPTS Upper-Level Resident Addendum  I have independently interviewed and examined the patient. I have discussed the above with the original author and agree with their documentation. My edits for correction/addition/clarification are in pink. Please see also any attending notes.   Frazier Richards, MD PGY-2, [place] Service pager: [phone] (text pages welcome through [facility])

## 2014-12-02 NOTE — Telephone Encounter (Signed)
Per desk RN I have moved appt from today to Wednesday. I have called and left message with the daughter.

## 2014-12-02 NOTE — ED Notes (Signed)
IV team at bedside 

## 2014-12-03 ENCOUNTER — Other Ambulatory Visit: Payer: Self-pay | Admitting: Oncology

## 2014-12-03 DIAGNOSIS — I82402 Acute embolism and thrombosis of unspecified deep veins of left lower extremity: Secondary | ICD-10-CM | POA: Diagnosis not present

## 2014-12-03 DIAGNOSIS — D72829 Elevated white blood cell count, unspecified: Secondary | ICD-10-CM

## 2014-12-03 DIAGNOSIS — I82C19 Acute embolism and thrombosis of unspecified internal jugular vein: Secondary | ICD-10-CM | POA: Insufficient documentation

## 2014-12-03 DIAGNOSIS — C50919 Malignant neoplasm of unspecified site of unspecified female breast: Secondary | ICD-10-CM | POA: Diagnosis not present

## 2014-12-03 DIAGNOSIS — I82C12 Acute embolism and thrombosis of left internal jugular vein: Secondary | ICD-10-CM | POA: Diagnosis not present

## 2014-12-03 LAB — CBC
HEMATOCRIT: 41.3 % (ref 36.0–46.0)
Hemoglobin: 13.7 g/dL (ref 12.0–15.0)
MCH: 28.6 pg (ref 26.0–34.0)
MCHC: 33.2 g/dL (ref 30.0–36.0)
MCV: 86.2 fL (ref 78.0–100.0)
PLATELETS: 276 10*3/uL (ref 150–400)
RBC: 4.79 MIL/uL (ref 3.87–5.11)
RDW: 14.6 % (ref 11.5–15.5)
WBC: 23.9 10*3/uL — ABNORMAL HIGH (ref 4.0–10.5)

## 2014-12-03 LAB — GLUCOSE, CAPILLARY
GLUCOSE-CAPILLARY: 95 mg/dL (ref 70–99)
Glucose-Capillary: 114 mg/dL — ABNORMAL HIGH (ref 70–99)
Glucose-Capillary: 126 mg/dL — ABNORMAL HIGH (ref 70–99)
Glucose-Capillary: 115 mg/dL — ABNORMAL HIGH (ref 70–99)

## 2014-12-03 LAB — BASIC METABOLIC PANEL
ANION GAP: 12 (ref 5–15)
BUN: 13 mg/dL (ref 6–23)
CHLORIDE: 108 mmol/L (ref 96–112)
CO2: 24 mmol/L (ref 19–32)
Calcium: 8.9 mg/dL (ref 8.4–10.5)
Creatinine, Ser: 0.98 mg/dL (ref 0.50–1.10)
GFR, EST AFRICAN AMERICAN: 68 mL/min — AB (ref >90)
GFR, EST NON AFRICAN AMERICAN: 59 mL/min — AB (ref >90)
Glucose, Bld: 105 mg/dL — ABNORMAL HIGH (ref 70–99)
Potassium: 4 mmol/L (ref 3.5–5.1)
SODIUM: 144 mmol/L (ref 135–145)

## 2014-12-03 LAB — LACTIC ACID, PLASMA
Lactic Acid, Venous: 3.6 mmol/L (ref 0.5–2.0)
Lactic Acid, Venous: 1.8 mmol/L (ref 0.5–2.0)

## 2014-12-03 LAB — PROTIME-INR
INR: 1.15 (ref 0.00–1.49)
PROTHROMBIN TIME: 14.9 s (ref 11.6–15.2)

## 2014-12-03 LAB — HEPARIN LEVEL (UNFRACTIONATED): HEPARIN UNFRACTIONATED: 0.84 [IU]/mL — AB (ref 0.30–0.70)

## 2014-12-03 MED ORDER — MELOXICAM 15 MG PO TABS
15.0000 mg | ORAL_TABLET | Freq: Every day | ORAL | Status: DC
  Administered 2014-12-04: 15 mg via ORAL
  Filled 2014-12-03 (×3): qty 1

## 2014-12-03 MED ORDER — INSULIN ASPART 100 UNIT/ML ~~LOC~~ SOLN: 0.0000 [IU] | Freq: Three times a day (TID) | SUBCUTANEOUS | Status: DC

## 2014-12-03 MED ORDER — PROCHLORPERAZINE MALEATE 10 MG PO TABS
10.0000 mg | ORAL_TABLET | Freq: Four times a day (QID) | ORAL | Status: DC | PRN
  Filled 2014-12-03: qty 1

## 2014-12-03 MED ORDER — ALTEPLASE 2 MG IJ SOLR
2.0000 mg | Freq: Once | INTRAMUSCULAR | Status: AC
  Administered 2014-12-03: 2 mg
  Filled 2014-12-03: qty 2

## 2014-12-03 MED ORDER — RIVAROXABAN 20 MG PO TABS: 20.0000 mg | ORAL_TABLET | Freq: Every day | ORAL | Status: DC

## 2014-12-03 MED ORDER — SODIUM CHLORIDE 0.9 % IJ SOLN
10.0000 mL | Freq: Two times a day (BID) | INTRAMUSCULAR | Status: DC
  Administered 2014-12-03: 10 mL

## 2014-12-03 MED ORDER — LORAZEPAM 0.5 MG PO TABS
0.5000 mg | ORAL_TABLET | Freq: Every evening | ORAL | Status: DC | PRN
  Administered 2014-12-03 (×2): 0.5 mg via ORAL
  Filled 2014-12-03 (×2): qty 1

## 2014-12-03 MED ORDER — LEVOTHYROXINE SODIUM 125 MCG PO TABS
125.0000 ug | ORAL_TABLET | Freq: Every day | ORAL | Status: DC
  Administered 2014-12-03 – 2014-12-04 (×2): 125 ug via ORAL
  Filled 2014-12-03 (×2): qty 1

## 2014-12-03 MED ORDER — EZETIMIBE 10 MG PO TABS
10.0000 mg | ORAL_TABLET | Freq: Every day | ORAL | Status: DC
  Administered 2014-12-03 – 2014-12-04 (×2): 10 mg via ORAL
  Filled 2014-12-03 (×2): qty 1

## 2014-12-03 MED ORDER — RIVAROXABAN 10 MG PO TABS
15.0000 mg | ORAL_TABLET | Freq: Two times a day (BID) | ORAL | Status: DC
  Administered 2014-12-03 – 2014-12-04 (×3): 15 mg via ORAL
  Filled 2014-12-03 (×2): qty 2

## 2014-12-03 MED ORDER — ACETAMINOPHEN 650 MG RE SUPP
650.0000 mg | Freq: Four times a day (QID) | RECTAL | Status: DC | PRN
Start: 2014-12-03 — End: 2014-12-04

## 2014-12-03 MED ORDER — SIMVASTATIN 40 MG PO TABS
40.0000 mg | ORAL_TABLET | Freq: Every day | ORAL | Status: DC
  Administered 2014-12-03: 40 mg via ORAL
  Filled 2014-12-03: qty 1

## 2014-12-03 MED ORDER — NITROGLYCERIN 0.4 MG SL SUBL: 0.4000 mg | SUBLINGUAL_TABLET | SUBLINGUAL | Status: DC | PRN

## 2014-12-03 MED ORDER — TOBRAMYCIN-DEXAMETHASONE 0.3-0.1 % OP SUSP
1.0000 [drp] | Freq: Two times a day (BID) | OPHTHALMIC | Status: DC
  Filled 2014-12-03: qty 2.5

## 2014-12-03 MED ORDER — METOPROLOL SUCCINATE ER 25 MG PO TB24
25.0000 mg | ORAL_TABLET | Freq: Every day | ORAL | Status: DC
Start: 2014-12-03 — End: 2014-12-04
  Administered 2014-12-03 – 2014-12-04 (×2): 25 mg via ORAL
  Filled 2014-12-03 (×2): qty 1

## 2014-12-03 MED ORDER — METOCLOPRAMIDE HCL 5 MG PO TABS
5.0000 mg | ORAL_TABLET | Freq: Four times a day (QID) | ORAL | Status: DC | PRN
  Administered 2014-12-03 – 2014-12-04 (×2): 5 mg via ORAL
  Filled 2014-12-03 (×2): qty 1

## 2014-12-03 MED ORDER — ONDANSETRON 8 MG PO TBDP
8.0000 mg | ORAL_TABLET | Freq: Two times a day (BID) | ORAL | Status: DC | PRN
  Filled 2014-12-03: qty 1

## 2014-12-03 MED ORDER — SODIUM CHLORIDE 0.9 % IJ SOLN
3.0000 mL | Freq: Two times a day (BID) | INTRAMUSCULAR | Status: DC
  Administered 2014-12-03: 3 mL via INTRAVENOUS

## 2014-12-03 MED ORDER — ACETAMINOPHEN 325 MG PO TABS: 650.0000 mg | ORAL_TABLET | Freq: Four times a day (QID) | ORAL | Status: DC | PRN

## 2014-12-03 MED ORDER — BUPROPION HCL ER (XL) 150 MG PO TB24
150.0000 mg | ORAL_TABLET | Freq: Every day | ORAL | Status: DC
  Administered 2014-12-03 – 2014-12-04 (×2): 150 mg via ORAL
  Filled 2014-12-03 (×2): qty 1

## 2014-12-03 MED ORDER — SODIUM CHLORIDE 0.9 % IJ SOLN
10.0000 mL | INTRAMUSCULAR | Status: DC | PRN
  Administered 2014-12-03 – 2014-12-04 (×3): 10 mL
  Filled 2014-12-03 (×3): qty 40

## 2014-12-03 NOTE — Discharge Instructions (Addendum)
You will be given a prescription for Xarelto 15mg  twice a day for 21 days; after the 21 days, you will begin to take Xarelto 20mg  daily. Please see your primary care provider for your prescription of Xarelto 20mg , which will start on 12/25/14. Information about Xarelto and DVTs is provided below.  Information on my medicine - XARELTO (rivaroxaban)  This medication education was reviewed with me or my healthcare representative as part of my discharge preparation.    WHY WAS XARELTO PRESCRIBED FOR YOU? Xarelto was prescribed to treat blood clots that may have been found in the veins of your legs (deep vein thrombosis) or in your lungs (pulmonary embolism) and to reduce the risk of them occurring again.  What do you need to know about Xarelto? The starting dose is one 15 mg tablet taken TWICE daily with food for the FIRST 21 DAYS then on 5/11 the dose is changed to one 20 mg tablet taken ONCE A DAY with your evening meal.  DO NOT stop taking Xarelto without talking to the health care provider who prescribed the medication.  Refill your prescription for 20 mg tablets before you run out.  After discharge, you should have regular check-up appointments with your healthcare provider that is prescribing your Xarelto.  In the future your dose may need to be changed if your kidney function changes by a significant amount.  What do you do if you miss a dose? If you are taking Xarelto TWICE DAILY and you miss a dose, take it as soon as you remember. You may take two 15 mg tablets (total 30 mg) at the same time then resume your regularly scheduled 15 mg twice daily the next day.  If you are taking Xarelto ONCE DAILY and you miss a dose, take it as soon as you remember on the same day then continue your regularly scheduled once daily regimen the next day. Do not take two doses of Xarelto at the same time.   Important Safety Information Xarelto is a blood thinner medicine that can cause bleeding. You  should call your healthcare provider right away if you experience any of the following: ? Bleeding from an injury or your nose that does not stop. ? Unusual colored urine (red or dark brown) or unusual colored stools (red or black). ? Unusual bruising for unknown reasons. ? A serious fall or if you hit your head (even if there is no bleeding).  Some medicines may interact with Xarelto and might increase your risk of bleeding while on Xarelto. To help avoid this, consult your healthcare provider or pharmacist prior to using any new prescription or non-prescription medications, including herbals, vitamins, non-steroidal anti-inflammatory drugs (NSAIDs) and supplements.  This website has more information on Xarelto: [email or web address]  Deep Vein Thrombosis A deep vein thrombosis (DVT) is a blood clot that develops in the deep, larger veins of the leg, arm, or pelvis. These are more dangerous than clots that might form in veins near the surface of the body. A DVT can lead to serious and even life-threatening complications if the clot breaks off and travels in the bloodstream to the lungs.  A DVT can damage the valves in your leg veins so that instead of flowing upward, the blood pools in the lower leg. This is called post-thrombotic syndrome, and it can result in pain, swelling, discoloration, and sores on the leg. CAUSES Usually, several things contribute to the formation of blood clots. Contributing factors include:  The flow of blood slows  down.  The inside of the vein is damaged in some way.  You have a condition that makes blood clot more easily. RISK FACTORS Some people are more likely than others to develop blood clots. Risk factors include:   Smoking.  Being overweight (obese).  Sitting or lying still for a long time. This includes long-distance travel, paralysis, or recovery from an illness or surgery. Other factors that increase risk are:   Older age, especially over 23 years of  age.  Having a family history of blood clots or if you have already had a blot clot.  Having major or lengthy surgery. This is especially true for surgery on the hip, knee, or belly (abdomen). Hip surgery is particularly high risk.  Having a long, thin tube (catheter) placed inside a vein during a medical procedure.  Breaking a hip or leg.  Having cancer or cancer treatment.  Pregnancy and childbirth.  Hormone changes make the blood clot more easily during pregnancy.  The fetus puts pressure on the veins of the pelvis.  There is a risk of injury to veins during delivery or a caesarean delivery. The risk is highest just after childbirth.  Medicines containing the female hormone estrogen. This includes birth control pills and hormone replacement therapy.  Other circulation or heart problems.  SIGNS AND SYMPTOMS When a clot forms, it can either partially or totally block the blood flow in that vein. Symptoms of a DVT can include:  Swelling of the leg or arm, especially if one side is much worse.  Warmth and redness of the leg or arm, especially if one side is much worse.  Pain in an arm or leg. If the clot is in the leg, symptoms may be more noticeable or worse when standing or walking. The symptoms of a DVT that has traveled to the lungs (pulmonary embolism, PE) usually start suddenly and include:  Shortness of breath.  Coughing.  Coughing up blood or blood-tinged mucus.  Chest pain. The chest pain is often worse with deep breaths.  Rapid heartbeat. Anyone with these symptoms should get emergency medical treatment right away. Do not wait to see if the symptoms will go away. Call your local emergency services (911 in the [place] if you have these symptoms. Do not drive yourself to the hospital. DIAGNOSIS If a DVT is suspected, your health care provider will take a full medical history and perform a physical exam. Tests that also may be required include:  Blood tests,  including studies of the clotting properties of the blood.  Ultrasound to see if you have clots in your legs or lungs.  X-rays to show the flow of blood when dye is injected into the veins (venogram).  Studies of your lungs if you have any chest symptoms. PREVENTION  Exercise the legs regularly. Take a brisk 30-minute walk every day.  Maintain a weight that is appropriate for your height.  Avoid sitting or lying in bed for long periods of time without moving your legs.  Women, particularly those over the age of 43 years, should consider the risks and benefits of taking estrogen medicines, including birth control pills.  Do not smoke, especially if you take estrogen medicines.  Long-distance travel can increase your risk of DVT. You should exercise your legs by walking or pumping the muscles every hour.  Many of the risk factors above relate to situations that exist with hospitalization, either for illness, injury, or elective surgery. Prevention may include medical and nonmedical measures.  Your health care provider will assess you for the need for venous thromboembolism prevention when you are admitted to the hospital. If you are having surgery, your surgeon will assess you the day of or day after surgery. TREATMENT Once identified, a DVT can be treated. It can also be prevented in some circumstances. Once you have had a DVT, you may be at increased risk for a DVT in the future. The most common treatment for DVT is blood-thinning (anticoagulant) medicine, which reduces the blood's tendency to clot. Anticoagulants can stop new blood clots from forming and stop old clots from growing. They cannot dissolve existing clots. Your body does this by itself over time. Anticoagulants can be given by mouth, through an IV tube, or by injection. Your health care provider will determine the best program for you. Other medicines or treatments that may be used are:  Heparin or related medicines (low  molecular weight heparin) are often the first treatment for a blood clot. They act quickly. However, they cannot be taken orally and must be given either in shot form or by IV tube.  Heparin can cause a fall in a component of blood that stops bleeding and forms blood clots (platelets). You will be monitored with blood tests to be sure this does not occur.  Warfarin is an anticoagulant that can be swallowed. It takes a few days to start working, so usually heparin or related medicines are used in combination. Once warfarin is working, heparin is usually stopped.  Factor Xa inhibitor medicines, such as rivaroxaban and apixaban, also reduce blood clotting. These medicines are taken orally and can often be used without heparin or related medicines.  Less commonly, clot dissolving drugs (thrombolytics) are used to dissolve a DVT. They carry a high risk of bleeding, so they are used mainly in severe cases where your life or a part of your body is threatened.  Very rarely, a blood clot in the leg needs to be removed surgically.  If you are unable to take anticoagulants, your health care provider may arrange for you to have a filter placed in a main vein in your abdomen. This filter prevents clots from traveling to your lungs. HOME CARE INSTRUCTIONS  Take all medicines as directed by your health care provider.  Learn as much as you can about DVT.  Wear a medical alert bracelet or carry a medical alert card.  Ask your health care provider how soon you can go back to normal activities. It is important to stay active to prevent blood clots. If you are on anticoagulant medicine, avoid contact sports.  It is very important to exercise. This is especially important while traveling, sitting, or standing for long periods of time. Exercise your legs by walking or by tightening and relaxing your leg muscles regularly. Take frequent walks.  You may need to wear compression stockings. These are tight elastic  stockings that apply pressure to the lower legs. This pressure can help keep the blood in the legs from clotting. Taking Warfarin Warfarin is a daily medicine that is taken by mouth. Your health care provider will advise you on the length of treatment (usually 3-6 months, sometimes lifelong). If you take warfarin:  Understand how to take warfarin and foods that can affect how warfarin works in Veterinary surgeon.  Too much and too little warfarin are both dangerous. Too much warfarin increases the risk of bleeding. Too little warfarin continues to allow the risk for blood clots. Warfarin and Regular Blood Testing  While taking warfarin, you will need to have regular blood tests to measure your blood clotting time. These blood tests usually include both the prothrombin time (PT) and international normalized ratio (INR) tests. The PT and INR results allow your health care provider to adjust your dose of warfarin. It is very important that you have your PT and INR tested as often as directed by your health care provider.  Warfarin and Your Diet Avoid major changes in your diet, or notify your health care provider before changing your diet. Arrange a visit with a registered dietitian to answer your questions. Many foods, especially foods high in vitamin K, can interfere with warfarin and affect the PT and INR results. You should eat a consistent amount of foods high in vitamin K. Foods high in vitamin K include:   Spinach, kale, broccoli, cabbage, collard and turnip greens, Brussels sprouts, peas, cauliflower, seaweed, and parsley.  Beef and pork liver.  Green tea.  Soybean oil. Warfarin with Other Medicines Many medicines can interfere with warfarin and affect the PT and INR results. You must:  Tell your health care provider about any and all medicines, vitamins, and supplements you take, including aspirin and other over-the-counter anti-inflammatory medicines. Be especially cautious with aspirin and  anti-inflammatory medicines. Ask your health care provider before taking these.  Do not take or discontinue any prescribed or over-the-counter medicine except on the advice of your health care provider or pharmacist. Warfarin Side Effects Warfarin can have side effects, such as easy bruising and difficulty stopping bleeding. Ask your health care provider or pharmacist about other side effects of warfarin. You will need to:  Hold pressure over cuts for longer than usual.  Notify your dentist and other health care providers that you are taking warfarin before you undergo any procedures where bleeding may occur. Warfarin with Alcohol and Tobacco   Drinking alcohol frequently can increase the effect of warfarin, leading to excess bleeding. It is best to avoid alcoholic drinks or to consume only very small amounts while taking warfarin. Notify your health care provider if you change your alcohol intake.   Do not use any tobacco products including cigarettes, chewing tobacco, or electronic cigarettes. If you smoke, quit. Ask your health care provider for help with quitting smoking. Alternative Medicines to Warfarin: Factor Xa Inhibitor Medicines  These blood-thinning medicines are taken by mouth, usually for several weeks or longer. It is important to take the medicine every single day at the same time each day.  There are no regular blood tests required when using these medicines.  There are fewer food and drug interactions than with warfarin.  The side effects of this class of medicine are similar to those of warfarin, including excessive bruising or bleeding. Ask your health care provider or pharmacist about other potential side effects. SEEK MEDICAL CARE IF:  You notice a rapid heartbeat.  You feel weaker or more tired than usual.  You feel faint.  You notice increased bruising.  You feel your symptoms are not getting better in the time expected.  You believe you are having side  effects of medicine. SEEK IMMEDIATE MEDICAL CARE IF:  You have chest pain.  You have trouble breathing.  You have new or increased swelling or pain in one leg.  You cough up blood.  You notice blood in vomit, in a bowel movement, or in urine. MAKE SURE YOU:  Understand these instructions.  Will watch your condition.  Will get help right away if you  are not doing well or get worse. Document Released: 08/02/2005 Document Revised: 12/17/2013 Document Reviewed: 04/09/2013 [facility] Patient Information 2015 [place], [place]. This information is not intended to replace advice given to you by your health care provider. Make sure you discuss any questions you have with your health care provider.

## 2014-12-03 NOTE — Progress Notes (Signed)
CRITICAL VALUE ALERT  Critical value received: 3.6  Date of notification:  4/19  Time of notification:  0233  Critical value read back:Yes.    Nurse who received alert:  Durene Fruits  MD notified (1st page):  Family Medicine (text page)  Time of first page:  [phone]  MD notified (2nd page):  Time of second page:  Responding MD:  Gerlean Ren  Time MD responded:  [phone]

## 2014-12-03 NOTE — Progress Notes (Signed)
Jordan [name]   DOB:12-23-1950   QM#:[id number]   DTO#:[id number]  Patient Care Team: Fara Chute, PA-C as PCP - General (Physician Assistant) [name], MD as Consulting Physician (General Surgery) Chauncey Cruel, MD as Consulting Physician (Oncology) Arloa Koh, MD as Consulting Physician (Radiation Oncology) Mauro Kaufmann, RN as Registered Nurse Rockwell Germany, RN as Registered Nurse Holley Bouche, NP as Nurse Practitioner (Nurse Practitioner)  Subjective: The patient was to begin chemotherapy with Cytoxan and Taxotere for 4 cycles with curative intent. At the time, due to left arm pain and edema and family history of 2 brothers who have had blood clots, further workup was needed prior to initiating chemotherapy. Doppler ultrasound on 4/18 revealed DVT requiring hospitalization for initiation of anticoagulation. Plans for chemo were postponed tentatively for 1 week.  Denies fevers, chills, night sweats, vision changes, or mucositis. Denies any respiratory complaints. Denies any chest pain or palpitations. Denies lower extremity swelling. Left arm swollen due to DVT.  Denies nausea, heartburn or change in bowel habits. Appetite is normal. Denies any dysuria. Denies abnormal skin rashes, or neuropathy. Denies any bleeding issues such as epistaxis, hematemesis, hematuria or hematochezia. Ambulating without difficulty.  Oncological History:   64 y.o.Jordan [name] woman status post right breast lower inner quadrant biopsy 09/24/2014 for a clinical T1a N0, stage IA invasive ductal carcinoma, grade 1 or 2, estrogen and progesterone receptor negative, with an MIB-1 of 41%, and HER-2 negative  (1) status post right lumpectomy and axillary sentinel lymph node sampling 10/17/2014 for a pT1b pN0, stage IA invasive ductal carcinoma, grade 3, repeat HER-2 again negative.  (2) adjuvant chemotherapy to consist of cyclophosphamide and docetaxel given every 3 weeks 4 with Neulasta  support  (3) adjuvant radiation to follow chemotherapy   (4) status post partial right colectomy with lymphadenectomy 03/30/2012 for a 2.7 cm tubular adenoma with high-grade dysplasia, no evidence of invasion, 0 of 11 lymph nodes involved ([phone] [id number])  (5) genetics testing sent 10/04/2014 through the [employer] gene panel /[employer] found no deleterious mutations in ATM, BARD1, BRCA1, BRCA2, BRIP1, CDH1, CHEK2, EPCAM, MLH1, MRE11A, MSH2, MSH6, MUTYH, NBN, NF1, PALB2, PMS2, PTEN, RAD50, RAD51C, RAD51D, SMARCA4, STK11, or TP53 (a) two variants of uncertain significance were found  (i) ATM, p.[id number] (ii) SMARCA4, p.[id number]  The patient was to begin chemotherapy with Cytoxan and Taxotere for 4 cycles  with curative intent At the time, due to left arm pain and edema and  family history of 2 brothers who have had blood clots, further workup was needed prior to initiating chemotherapy.    Doppler ultrasound on 4/18 revealed DVT requiring hospitalization for initiation of anticoagulation. Plans for chemo were postponed tentatively for 1 week.     Scheduled Meds: . alteplase  2 mg Intracatheter Once  . buPROPion  150 mg Oral Daily  . ezetimibe  10 mg Oral Daily  . insulin aspart  0-9 Units Subcutaneous TID WC  . levothyroxine  125 mcg Oral QAC breakfast  . meloxicam  15 mg Oral Q breakfast  . metoprolol succinate  25 mg Oral Daily  . simvastatin  40 mg Oral QHS  . sodium chloride  10-40 mL Intracatheter Q12H  . sodium chloride  3 mL Intravenous Q12H  . tobramycin-dexamethasone  1 drop Both Eyes BID   Continuous Infusions: . heparin 1,300 Units/hr (12/02/14 2309)   PRN Meds:acetaminophen **OR** acetaminophen, LORazepam, metoCLOPramide, nitroGLYCERIN, ondansetron, prochlorperazine, sodium chloride   Objective:  Filed Vitals:   12/03/14  0511  BP: 111/44  Pulse: 75  Temp: 97.5 F (36.4 C)  Resp:       GENERAL:alert, no  distress and comfortable SKIN: skin color, texture, turgor are normal, no rashes or significant lesions EYES: normal, conjunctiva are pink and non-injected, sclera clear OROPHARYNX:no exudate, no erythema and lips, buccal mucosa, and tongue normal  NECK: supple, thyroid normal size, non-tender, without nodularity LYMPH:  no palpable lymphadenopathy in the cervical, axillary or inguinal LUNGS: clear to auscultation and percussion with normal breathing effort HEART: regular rate & rhythm and no murmurs and no lower extremity edema ABDOMEN: soft, non-tender and normal bowel sounds Musculoskeletal:no cyanosis of digits and no clubbing  PSYCH: alert & oriented x 3 with fluent speech NEURO: no focal motor/sensory deficits    CBG (last 3)   Recent Labs  12/03/14 0628  GLUCAP 114*     Labs:   Recent Labs Lab 12/02/14 1055 12/03/14 0340  WBC 26.8* 23.9*  HGB 14.6 13.7  HCT 46.8* 41.3  PLT 310 276  MCV 86.9 86.2  MCH 27.2 28.6  MCHC 31.3* 33.2  RDW 14.6* 14.6  LYMPHSABS 1.8  --   MONOABS 0.8  --   EOSABS 0.0  --   BASOSABS 0.1  --      Chemistries:    Recent Labs Lab 12/02/14 1055 12/03/14 0340  NA 144 144  K 3.6 4.0  CL  --  108  CO2 20* 24  GLUCOSE 216* 105*  BUN 13.2 13  CREATININE 1.1 0.98  CALCIUM 9.3 8.9  AST 22  --   ALT 29  --   ALKPHOS 62  --   BILITOT 0.36  --     GFR Estimated Creatinine Clearance: 63.2 mL/min (by C-G formula based on Cr of 0.98).  Liver Function Tests:  Recent Labs Lab 12/02/14 1055  AST 22  ALT 29  ALKPHOS 62  BILITOT 0.36  PROT 6.8  ALBUMIN 3.3*    Urine Studies     Component Value Date/Time   COLORURINE YELLOW 03/09/2011 0857   APPEARANCEUR CLOUDY* 03/09/2011 0857   LABSPEC 1.022 03/09/2011 0857   PHURINE 5.5 03/09/2011 0857   GLUCOSEU NEGATIVE 03/09/2011 0857   HGBUR NEGATIVE 03/09/2011 0857   BILIRUBINUR SMALL* 03/09/2011 0857   KETONESUR NEGATIVE 03/09/2011 0857   PROTEINUR NEGATIVE 03/09/2011 0857    UROBILINOGEN 0.2 03/09/2011 0857   NITRITE NEGATIVE 03/09/2011 0857   LEUKOCYTESUR LARGE* 03/09/2011 0857    Coagulation profile  Recent Labs Lab 12/02/14 1900 12/03/14 0340  INR 1.03 1.15      Imaging Studies:  No results found.  Assessment/Plan: 64 y.o. Jordan [name] woman status post right breast lower inner quadrant biopsy 09/24/2014 for a clinical T1a N0, stage IA invasive ductal carcinoma, grade 1 or 2, estrogen and progesterone receptor negative, with an MIB-1 of 41%, and HER-2 negative  (1) status post right lumpectomy and axillary sentinel lymph node sampling 10/17/2014 for a pT1b pN0, stage IA invasive ductal carcinoma, grade 3, repeat HER-2 again negative.  (2) adjuvant chemotherapy to consist of cyclophosphamide and docetaxel given every 3 weeks 4 with Neulasta support to be postponed to 12/23/14 due to current hospitalization for left upper extremity DVT  (3) adjuvant radiation to follow chemotherapy   Left Upper Extremity DVT In the setting of malignancy and recent port A cath placement on 3/31 Continue anticoagulation with heparin to transition to Xarelto by Pharmacy  Leukocytosis Likely reactive No intervention is indicated at this time Will continue to  monitor   Full Code   Other medical issues as per admitting team     **Disclaimer: This note was dictated with voice recognition software. Similar sounding words can inadvertently be transcribed and this note may contain transcription errors which may not have been corrected upon publication of note.Sharene Butters E, PA-C 12/03/2014  12:49 PM

## 2014-12-03 NOTE — Progress Notes (Signed)
ANTICOAGULATION CONSULT NOTE  Pharmacy Consult for Heparin Indication: DVT  No Known Allergies  Patient Measurements: Weight: 230 lb 13.2 oz (104.7 kg) Heparin Dosing Weight: 74 kg  Vital Signs: Temp: 97.5 F (36.4 C) (04/19 0511) Temp Source: Oral (04/19 0511) BP: 111/44 mmHg (04/19 0511) Pulse Rate: 75 (04/19 0511)  Labs:  Recent Labs  12/02/14 1055 12/02/14 1900 12/03/14 0340  HGB 14.6  --  13.7  HCT 46.8*  --  41.3  PLT 310  --  276  LABPROT  --  13.6 14.9  INR  --  1.03 1.15  HEPARINUNFRC  --   --  0.84*  CREATININE 1.1  --  0.98    Estimated Creatinine Clearance: 63.2 mL/min (by C-G formula based on Cr of 0.98).  Assessment: 64 y.o. female with jugular vein thrombosis for heparin  Goal of Therapy:  Heparin level 0.3-0.7 units/ml Monitor platelets by anticoagulation protocol: Yes   Plan:  Decrease Heparin 1200 units/hr Check heparin level in 8 hours.  Phillis Knack, PharmD, BCPS  12/03/2014,6:26 AM

## 2014-12-03 NOTE — Progress Notes (Signed)
UR completed 

## 2014-12-03 NOTE — Progress Notes (Signed)
[facility] for Heparin > xarelto  Indication: DVT  No Known Allergies  Patient Measurements: Weight: 230 lb 13.2 oz (104.7 kg) Heparin Dosing Weight: 74 kg  Vital Signs: Temp: 98.1 F (36.7 C) (04/19 1249) Temp Source: Oral (04/19 0511) BP: 130/48 mmHg (04/19 1249) Pulse Rate: 70 (04/19 1249)  Labs:  Recent Labs  12/02/14 1055 12/02/14 1900 12/03/14 0340  HGB 14.6  --  13.7  HCT 46.8*  --  41.3  PLT 310  --  276  LABPROT  --  13.6 14.9  INR  --  1.03 1.15  HEPARINUNFRC  --   --  0.84*  CREATININE 1.1  --  0.98    Estimated Creatinine Clearance: 63.2 mL/min (by C-G formula based on Cr of 0.98).  Assessment: 64 y.o. female with jugular vein thrombosis started on IV heparin last night, now to transition to xarelto. CBC wnl. Scr 0.98, est. crcl ~ 60 ml/min. LFTs wnl.  Goal of Therapy:  Monitor platelets by anticoagulation protocol: Yes   Plan:  D/c IV heparin. Start Xarelto 15mg  BID for 21 days, then switch to 20 mg daily from 5/11 Monitor renal function and CBC Xarelto education with Pharmacist  Maryanna Shape, PharmD, BCPS  Clinical Pharmacist  Pager: [phone]   12/03/2014,1:45 PM

## 2014-12-03 NOTE — Consult Note (Signed)
ADDENDUM: Greatly appreciate your care of this patient.  Have appended practice guidelines for the management of upper extremity catheter associated ports. I would plant to keep the port unless it is non-functional and continue the xarelto/rivaroxaban for a few weeks beyond completion of chemotherapy and port removal.  Will reschedule chemo to start 12/09/2014. Patient is aware.  Thank you again for your help!  Chauncey Cruel, MD   12/03/2014 1:34 PM Medical Oncology and Hematology [facility] [address] [place], [place] [address] Tel. [phone]    Fax. [phone]  NB: International clinical practice guidelines for the treatment and prophylaxis of thrombosis associated with central venous catheters in patients with cancer.  AU  Debourdeau P, Farge D, Beckers M, Baglin C, Bauersachs RM, Karlyn Agee D, [place] A, Gerotzafias GT, [place], [place], [place], [place], [place] M, Marty M, Monreal M, Mousa SA, [employer], Pabinger I, Prandoni P, [place] MH, [place] MH, Streiff MB, Syrigos K, Bller [place], Boun[place] H  SO  J Thromb Haemost. 2013 Jan;11(1):71-80.  Summary.?Background:?Although long-term indwelling central venous catheters (CVCs) may lead to pulmonary embolism (PE) and loss of the CVC, there is lack of consensus on management of CVC-related thrombosis (CRT) in cancer patients and heterogeneity in clinical practices worldwide. Objectives:?To establish common international Good Clinical Practices Guidelines (GCPG) for the management of CRT in cancer patients. Methods:?An international working group of experts was set up to develop GCPG according to an evidence-based medicine approach, using the GRADE system. Results:?For the treatment of established CRT in cancer patients, we found no prospective randomized studies, two non-randomized prospective studies and one retrospective study examining the efficacy and safety of low-molecular-weight heparin (LMWH)  plus vitamin K antagonists (VKAs). One retrospective study evaluated the benefit of CVC removal and two small retrospective studies were on thrombolytic drugs. For the treatment of symptomatic CRT, anticoagulant treatment [facility]) is recommended for a minimum of 3?months; in this setting, LMWHs are suggested. VKAs can also be used, in the absence of direct comparisons of these two types of anticoagulants in this setting [Guidance]. The CVC can be kept in place if it is functional, well-positioned and non-infected and there is good resolution under close surveillance; whether the CVC is kept or removed, no standard approach in terms of AC duration has been established [Guidance]. For the prophylaxis of CRT in cancer patients, we found six randomized studies investigating the efficacy and safety of VKA vs. placebo or no treatment, one on the efficacy and safety of unfractionnated heparin, six on the value of LMWH, one double-blind randomized and one non randomized study on thrombolytic drugs and six meta-analyses of AC and CVC thromboprophylaxis. Type of catheter (open-ended like the Hickman() catheter vs. closed-ended catheter with a valve like the Groshong() catheter), its position (above, below or at the junction of the superior vena cava and the right atrium) and method of placement may influence the onset of CRT on the basis of six retrospective trials, four prospective non-randomized trials, three randomized trials and one meta-analysis. In light of these data: use of AC for routine prophylaxis of CRT is not recommended [1A]; a CVC should be inserted on the right side, in the jugular vein, and distal extremity of the CVC should be located at the junction of the superior vena cava and the right atrium [1A]. Conclusion:?Dissemination and implementation of these international GCPG for the prevention and treatment of CRT in cancer patients at each national level is a major public health priority, needing  worldwide  collaboration.  AD

## 2014-12-03 NOTE — Progress Notes (Signed)
Family Medicine Teaching Service Daily Progress Note Intern Pager: [phone]  Patient name: Jordan [name] Medical record number: [id number] Date of birth: [birth year] Age: 64 y.o. Gender: female  Primary Care Provider: JEFFERY,CHELLE, PA-C Consultants: None, Discussed with Interventional Radiology Code Status: Full  Assessment and Plan: 64 y.o. female presenting with left upper extremity swelling and pain, doppler with DVT of left internal jugular vein. PMH is significant for right sided breast cancer s/p lumpectomy 10/17/14, HLD, hypothyroidism, migraines, hemorrhoids, GERD, dizziness/vertigo, HTN, partial right colectomy 2013 for tubular adenoma and high-grade dysplasia  # DVT: Doppler prior to hospitalization with occlusion of left internal jugular vein. History of Port-A-Cath placement 11/14/14. Family history of blood clots. Increased risk with breast cancer. HR normal, satting 100% on RA - Heparin level 0.84 - INR 1.15 - Heparin drip per pharmacy--transition to Xarelto - Holding Aspirin. States she was told not to take it due to her breast cancer and chemotherapy. - Case discussed with Interventional Radiology. Recommended transition to oral anticoagulant when appropriate. Okay to have nurse flush port-a-cath. - Oncology consulted. Appreciate recommendations.--Believe DVT secondary to line and not due to metastasis or cancer due to lumpectomy. - IV team contacted to flush line  # Breast Cancer: Bilaterally screening mammography in 06/2014 with category B breast density and possible mass. Right diagnostic mammography and [place] in January 2016 showed slightly irregular mass in lower inner quadrant of right breast. [place] with small hypoechoic mass 77mm. Had right lumpectomy and axillary sentinel lymph node sampling on 10/17/14 and biopsy showed invasive ductal carcinoma, grade 2, estrogen and progesterone receptor negative. Port-A-Cath placed 11/14/14. Was scheduled for first doses of Taxotere and  Cytoxan on 4/18 but was sent to ED. Radiation to follow chemotherapy. Chemotherapy delayed until 4/20  # Hyperglycemia: No history of DM. Glucose 216 at admission. May be secondary to dexamethasone prior to chemotherapy - CBG 105 - Follow up A1C - Sensitive Insulin Sliding Scale - Continue to monitor  # Hypertension: BP 111-138 / 77-84. Home medication includes Metoprolol 25mg . States HCTZ was discontinued. - Continue home medication  # Depression: Home medication includes Bupropion 150mg  daily, Ativan 0.5mg  at bedtime PRN - Continue home medications  # Hyperlipidemia: Home medications include Zetia 10mg , Simvastatin 40mg  - Continue home medications  # Hypothyroidism: Home medications include Synthroid 177mcg. TSH 2.835 in January 2016 - Continue home medications  FEN/GI: Regular Diet Prophylaxis: Heparin Drip  Disposition: Admitted to [place], Mingo Amber attending  Subjective:  Feeling well today. Little arm pain, but states she has not been moving it that much. Notes nausea.  Objective: Temp:  [97.5 F (36.4 C)-98.4 F (36.9 C)] 97.5 F (36.4 C) (04/19 0511) Pulse Rate:  [70-82] 75 (04/19 0511) Resp:  [17-18] 18 (04/18 2345) BP: (111-147)/(44-84) 111/44 mmHg (04/19 0511) SpO2:  [99 %-100 %] 100 % (04/19 0511) Weight:  [230 lb 13.2 oz (104.7 kg)-231 lb 8 oz (105.008 kg)] 230 lb 13.2 oz (104.7 kg) (04/19 0040) Physical Exam: General: 64yo female resting comfortably in no apparent distress Cardiovascular: S1 and S2 noted. No murmurs. Regular rate and rhythm. Tenderness over left chest at site of Port-A-Cath Respiratory: Clear to auscultation bilaterally. No wheezing. No increased work of breathing. Abdomen: Soft and non-distended. Bowel sounds noted. No tenderness to palpation. Extremities: +1 edema in lower extremities. Edema of left arm.  Laboratory:  Recent Labs Lab 12/02/14 1055 12/03/14 0340  WBC 26.8* 23.9*  HGB 14.6 13.7  HCT 46.8*  41.3  PLT 310 276  Recent Labs Lab 12/02/14 1055 12/03/14 0340  NA 144 144  K 3.6 4.0  CL  --  108  CO2 20* 24  BUN 13.2 13  CREATININE 1.1 0.98  CALCIUM 9.3 8.9  PROT 6.8  --   BILITOT 0.36  --   ALKPHOS 62  --   ALT 29  --   AST 22  --   GLUCOSE 216* 105*  - Lactic Acid 3.6 > 1.8 - Heparin 0.84 - INR 1.15  Lorna Few, DO 12/03/2014, 8:35 AM PGY-1, [place] Intern pager: [phone], text pages welcome

## 2014-12-04 ENCOUNTER — Telehealth: Payer: Self-pay | Admitting: Oncology

## 2014-12-04 ENCOUNTER — Ambulatory Visit: Payer: Medicare PPO

## 2014-12-04 DIAGNOSIS — I82C12 Acute embolism and thrombosis of left internal jugular vein: Secondary | ICD-10-CM | POA: Diagnosis not present

## 2014-12-04 DIAGNOSIS — I82402 Acute embolism and thrombosis of unspecified deep veins of left lower extremity: Secondary | ICD-10-CM | POA: Diagnosis not present

## 2014-12-04 DIAGNOSIS — C50919 Malignant neoplasm of unspecified site of unspecified female breast: Secondary | ICD-10-CM | POA: Diagnosis not present

## 2014-12-04 LAB — CBC
HEMATOCRIT: 39.1 % (ref 36.0–46.0)
HEMOGLOBIN: 12.5 g/dL (ref 12.0–15.0)
MCH: 27.9 pg (ref 26.0–34.0)
MCHC: 32 g/dL (ref 30.0–36.0)
MCV: 87.3 fL (ref 78.0–100.0)
Platelets: 255 10*3/uL (ref 150–400)
RBC: 4.48 MIL/uL (ref 3.87–5.11)
RDW: 14.8 % (ref 11.5–15.5)
WBC: 13.3 10*3/uL — ABNORMAL HIGH (ref 4.0–10.5)

## 2014-12-04 LAB — GLUCOSE, CAPILLARY
GLUCOSE-CAPILLARY: 89 mg/dL (ref 70–99)
GLUCOSE-CAPILLARY: 80 mg/dL (ref 70–99)

## 2014-12-04 LAB — HEMOGLOBIN A1C
Hgb A1c MFr Bld: 6.4 % — ABNORMAL HIGH (ref 4.8–5.6)
MEAN PLASMA GLUCOSE: 137 mg/dL

## 2014-12-04 LAB — PROTIME-INR
INR: 1.44 (ref 0.00–1.49)
Prothrombin Time: 17.7 seconds — ABNORMAL HIGH (ref 11.6–15.2)

## 2014-12-04 MED ORDER — RIVAROXABAN 15 MG PO TABS: 15.0000 mg | ORAL_TABLET | Freq: Two times a day (BID) | ORAL | Status: DC

## 2014-12-04 MED ORDER — METFORMIN HCL 500 MG PO TABS
500.0000 mg | ORAL_TABLET | Freq: Every day | ORAL | Status: DC
Start: 2014-12-05 — End: 2014-12-04

## 2014-12-04 MED ORDER — HEPARIN SOD (PORK) LOCK FLUSH 100 UNIT/ML IV SOLN
500.0000 [IU] | INTRAVENOUS | Status: AC | PRN
  Administered 2014-12-04: 500 [IU]

## 2014-12-04 NOTE — Progress Notes (Signed)
Family Medicine Teaching Service Daily Progress Note Intern Pager: [phone]  Patient name: Jordan [name] Medical record number: [id number] Date of birth: October 06, 1950 Age: 64 y.o. Gender: female  Primary Care Provider: JEFFERY,CHELLE, PA-C Consultants: None, Discussed with Interventional Radiology Code Status: Full  Assessment and Plan: 64 y.o. female presenting with left upper extremity swelling and pain, doppler with DVT of left internal jugular vein. PMH is significant for right sided breast cancer s/p lumpectomy 10/17/14, HLD, hypothyroidism, migraines, hemorrhoids, GERD, dizziness/vertigo, HTN, partial right colectomy 2013 for tubular adenoma and high-grade dysplasia  # DVT: Doppler prior to hospitalization with occlusion of left internal jugular vein. History of Port-A-Cath placement 11/14/14. Family history of blood clots. Increased risk with breast cancer. HR normal, satting 100% on RA - Heparin level 0.84 - INR 1.15 - Heparin drip (4/18>4/19) - Transitioned to Xarelto 15mg  BID. Will contact Care Management to help affording after discharge. Continue 15mg  BID for 21 days and then switch to 20mg  daily (5/11) - Holding Aspirin. States she was told not to take it due to her breast cancer and chemotherapy. - Oncology consulted. Appreciate recommendations.--Believe DVT secondary to line and not due to metastasis or cancer due to lumpectomy. - IV team contacted to flush line BID  # Breast Cancer: Bilaterally screening mammography in 06/2014 with category B breast density and possible mass. Right diagnostic mammography and [place] in January 2016 showed slightly irregular mass in lower inner quadrant of right breast. [place] with small hypoechoic mass 49mm. Had right lumpectomy and axillary sentinel lymph node sampling on 10/17/14 and biopsy showed invasive ductal carcinoma, grade 2, estrogen and progesterone receptor negative. Port-A-Cath placed 11/14/14. Was scheduled for first doses of Taxotere and Cytoxan  on 4/18 but was sent to ED. Radiation to follow chemotherapy. Chemotherapy delayed until 4/25  # Hyperglycemia: No history of DM. Glucose 216 at admission. May be secondary to dexamethasone prior to chemotherapy - A1C 6.4 - Metformin 500mg  - Continue to monitor  FEN/GI: Regular Diet Prophylaxis: Heparin Drip  Disposition: Admitted to [facility] Medicine Teaching Service, Mingo Amber attending  Subjective:  Feeling well today. Pain in left arm resolved. Edema improving. Anxious for discharge.  Objective: Temp:  [98 F (36.7 C)-98.2 F (36.8 C)] 98.2 F (36.8 C) (04/20 0532) Pulse Rate:  [68-70] 69 (04/20 0532) Resp:  [18] 18 (04/20 0532) BP: (111-130)/(48-49) 122/49 mmHg (04/20 0532) SpO2:  [98 %-100 %] 99 % (04/20 0532) Physical Exam: General: 64yo female resting comfortably in no apparent distress Cardiovascular: S1 and S2 noted. No murmurs. Regular rate and rhythm. Tenderness over left chest at site of Port-A-Cath Respiratory: Clear to auscultation bilaterally. No wheezing. No increased work of breathing. Abdomen: Soft and non-distended. Bowel sounds noted. No tenderness to palpation. Extremities: +1 edema in lower extremities. Edema of left arm, improving.  Laboratory:  Recent Labs Lab 12/02/14 1055 12/03/14 0340 12/04/14 0520  WBC 26.8* 23.9* 13.3*  HGB 14.6 13.7 12.5  HCT 46.8* 41.3 39.1  PLT 310 276 255    Recent Labs Lab 12/02/14 1055 12/03/14 0340  NA 144 144  K 3.6 4.0  CL  --  108  CO2 20* 24  BUN 13.2 13  CREATININE 1.1 0.98  CALCIUM 9.3 8.9  PROT 6.8  --   BILITOT 0.36  --   ALKPHOS 62  --   ALT 29  --   AST 22  --   GLUCOSE 216* 105*  - Lactic Acid 3.6 > 1.8 - Heparin 0.84 - INR 1.15  [facility]  Zipporah Plants, DO 12/04/2014, 8:07 AM PGY-1, [place] Intern pager: [phone], text pages welcome

## 2014-12-04 NOTE — Plan of Care (Signed)
Problem: Consults Goal: Diagnosis - Venous Thromboembolism (VTE) Choose a selection Outcome: Completed/Met Date Met:  12/04/14 DVT (Deep Vein Thrombosis) Left IJ

## 2014-12-04 NOTE — Progress Notes (Signed)
Patient complaining of pain in her left arm, and states arm feels more "tight" than this AM. Upon assessment, no redness or significant change in arm noted. MD on call, Junie Panning made aware. Will continue to monitor.

## 2014-12-04 NOTE — Progress Notes (Signed)
UR completed 

## 2014-12-04 NOTE — Telephone Encounter (Signed)
called and left a message with new appointments for 4/25 and made her aware of md/chemo time lapse

## 2014-12-05 NOTE — Care Management Note (Signed)
CARE MANAGEMENT NOTE 12/05/2014  Patient:  Jordan [name], Jordan [name]   Account Number:  [id number]  Date Initiated:  12/04/2014  Documentation initiated by:  Ricki Miller  Subjective/Objective Assessment:   64 yr old female admitted with a left IJ DVT.     Action/Plan:   Patient was given 30 day card for Xarelto. Her co-pay will be $40.00, she didnt require any home health assistance.   Anticipated DC Date:  12/04/2014   Anticipated DC Plan:  HOME/SELF CARE      DC Planning Services  CM consult      Choice offered to / List presented to:     DME arranged  NA        HH arranged  NA      Status of service:   Medicare Important Message given?  NA - LOS <3 / Initial given by admissions (If response is "NO", the following Medicare IM given date fields will be blank) Date Medicare IM given:   Medicare IM given by:   Date Additional Medicare IM given:   Additional Medicare IM given by:    Discharge Disposition:  HOME/SELF CARE  Per UR Regulation:  Reviewed for med. necessity/level of care/duration of stay  If discussed at [place] of Stay Meetings, dates discussed:    Comments:

## 2014-12-06 NOTE — Progress Notes (Signed)
[facility] Discharge Summary  Patient name: Jordan [name] Medical record number: [id number] Date of birth: July 14, 1951 Age: 64 y.o. Gender: female Date of Admission: 12/02/2014  Date of Discharge: 12/04/14 Admitting Physician: Alveda Reasons, MD  Primary Care Provider: JEFFERY,CHELLE, PA-C Consultants: Interventional Radiology, Hematology/Oncology  Indication for Hospitalization: DVT  Discharge Diagnoses/Problem List:  DVT Breast Cancer Hypertension Hypothyroidism Depression  Disposition: Discharge Home  Discharge Condition: Stable  Discharge Exam: Please refer to Progress Note from 12/04/14  Brief Hospital Course:  Jordan [name] is a 64yo female who presented to the Emergency Department on 4/18 with increased tenderness and swelling in her left upper arm. History of Breast Cancer with Port-A-Cath placement on 11/14/14. Was going to first chemotherapy treatment on 4/18 when swelling was noted; subsequently sent for Dopplers, which showed left internal jugular vein DVT. Leukocytosis of 26.8 noted. Glucose noted to be 216. Admitted to [facility] Medicine Teaching Service.   Heparin drip initiated at admission. Home medications, including Metoprolol, Bupropion, Ativan PRN, Zetia, Simvastatin, and Synthroid, were continued during hospitalization. Interventional Radiology contacted concerning use of Port-A-Cath and recommended flushing catheter to ensure it could still be used. IV team contacted and flushed catheter BID throughout hospitalization and good blood return observed. Hematology/Oncology contacted and etiology of hypercoagulability discussed. Due to lumpectomy without concern for metastasis, it was felt that the DVT was secondary to catheter placement and not malignancy. Jordan [name] was transitioned off of heparin drip to Xarelto on 12/03/14. Pharmacy recommended dose of Xarelto 15mg  BID for 21 days and then transitioning to 20mg  daily on 12/25/14. Care  Management was contacted concerning affordability of Xarelto after discharge.  Due to elevated blood sugars at admission, Jordan [name] was initiated on sensitive insulin sliding scale and A1C was obtained. Suspected to be secondary to dexamethasone taken prior to anticipated chemotherapy on day of admission. A1C 6.4 and glucose improved during hospitalization.  Jordan [name] was discharged on 12/04/14 following initiation of anticoagulation and improvement in left arm swelling and pain. Leukocytosis improved to 13.3 prior to discharge.  Issues for Follow Up:  1. Monitor left arm edema and tenderness for continued improvement 2. Discharged with Xarelto. Continue 15mg  BID dosing for 21 days. Will transition to 20mg  daily dosing on 12/25/14. Needs prescription for daily dosing prior to 5/11. 3. Chemotherapy delayed until 12/09/14 4. A1C 6.4. Continue to monitor and counsel on diet and exercise.  Significant Procedures: None  Significant Labs and Imaging:   Recent Labs Lab 12/02/14 1055 12/03/14 0340 12/04/14 0520  WBC 26.8* 23.9* 13.3*  HGB 14.6 13.7 12.5  HCT 46.8* 41.3 39.1  PLT 310 276 255    Recent Labs Lab 12/02/14 1055 12/03/14 0340  NA 144 144  K 3.6 4.0  CL  --  108  CO2 20* 24  GLUCOSE 216* 105*  BUN 13.2 13  CREATININE 1.1 0.98  CALCIUM 9.3 8.9  ALKPHOS 62  --   AST 22  --   ALT 29  --   ALBUMIN 3.3*  --   - A1C 6.4  Results/Tests Pending at Time of Discharge: None  Discharge Medications:    Medication List    TAKE these medications        aspirin 81 MG tablet  Take 81 mg by mouth daily.     buPROPion 150 MG 24 hr tablet  Commonly known as:  WELLBUTRIN XL  Take 1 tablet (150 mg total) by mouth daily.     dexamethasone 4 MG tablet  Commonly known as:  DECADRON  Take 2 tablets (8 mg total) by mouth 2 (two) times daily. Start the day before Taxotere. Then again the day after chemo for 3 days.     ezetimibe 10 MG tablet  Commonly known as:  ZETIA  Take  1 tablet (10 mg total) by mouth daily.     levothyroxine 125 MCG tablet  Commonly known as:  SYNTHROID, LEVOTHROID  TAKE 1 TABLET BY MOUTH DAILY     lidocaine-prilocaine cream  Commonly known as:  EMLA  Apply to affected area once     loratadine 10 MG tablet  Commonly known as:  CLARITIN  Take 1 tablet (10 mg total) by mouth daily.     LORazepam 0.5 MG tablet  Commonly known as:  ATIVAN  Take 1 tablet (0.5 mg total) by mouth at bedtime as needed (Nausea or vomiting).     meclizine 25 MG tablet  Commonly known as:  ANTIVERT  Take 1 tablet (25 mg total) by mouth 4 (four) times daily as needed for dizziness.     meloxicam 15 MG tablet  Commonly known as:  MOBIC  Take 1 tablet (15 mg total) by mouth daily.     metoprolol succinate 25 MG 24 hr tablet  Commonly known as:  TOPROL-XL  Take 1 tablet (25 mg total) by mouth daily.     nitroGLYCERIN 0.4 MG SL tablet  Commonly known as:  NITROSTAT  Place 1 tablet (0.4 mg total) under the tongue every 5 (five) minutes as needed for chest pain.     ondansetron 8 MG tablet  Commonly known as:  ZOFRAN  Take 1 tablet (8 mg total) by mouth 2 (two) times daily. Start the day after chemo for 3 days. Then take as needed for nausea or vomiting.     oxyCODONE-acetaminophen 5-325 MG per tablet  Commonly known as:  ROXICET  Take 1-2 tablets by mouth every 4 (four) hours as needed.     PRENATAL VITAMINS PO  Take by mouth.     prochlorperazine 10 MG tablet  Commonly known as:  COMPAZINE  Take 1 tablet (10 mg total) by mouth every 6 (six) hours as needed (Nausea or vomiting).     Rivaroxaban 15 MG Tabs tablet  Commonly known as:  XARELTO  Take 1 tablet (15 mg total) by mouth 2 (two) times daily with a meal.     simvastatin 40 MG tablet  Commonly known as:  ZOCOR  Take 1 tablet (40 mg total) by mouth at bedtime.     tobramycin-dexamethasone ophthalmic solution  Commonly known as:  TOBRADEX  Place 1 drop into both eyes 2 (two) times  daily.     triamterene-hydrochlorothiazide 37.5-25 MG per tablet  Commonly known as:  MAXZIDE-25  TAKE 1 TABLET DAILY        Discharge Instructions: Please refer to Patient Instructions section of EMR for full details.  Patient was counseled important signs and symptoms that should prompt return to medical care, changes in medications, dietary instructions, activity restrictions, and follow up appointments.   Follow-Up Appointments:     Follow-up Information    Follow up with JEFFERY,CHELLE, PA-C. Schedule an appointment as soon as possible for a visit in 1 week.   Specialty:  Physician Assistant   Why:  Hospital Follow Up, Prescription for Xarelto   Contact information:   [place] [address] [phone]       Lorna Few, DO 12/06/2014, 10:43 PM  PGY-1, [place]

## 2014-12-09 ENCOUNTER — Ambulatory Visit (HOSPITAL_BASED_OUTPATIENT_CLINIC_OR_DEPARTMENT_OTHER): Payer: Medicare PPO

## 2014-12-09 ENCOUNTER — Other Ambulatory Visit: Payer: Medicare PPO

## 2014-12-09 ENCOUNTER — Telehealth: Payer: Self-pay | Admitting: Nurse Practitioner

## 2014-12-09 ENCOUNTER — Ambulatory Visit (HOSPITAL_BASED_OUTPATIENT_CLINIC_OR_DEPARTMENT_OTHER): Payer: Medicare PPO | Admitting: Nurse Practitioner

## 2014-12-09 ENCOUNTER — Ambulatory Visit: Payer: Medicare PPO | Admitting: Nurse Practitioner

## 2014-12-09 ENCOUNTER — Other Ambulatory Visit (HOSPITAL_BASED_OUTPATIENT_CLINIC_OR_DEPARTMENT_OTHER): Payer: Medicare PPO

## 2014-12-09 ENCOUNTER — Encounter: Payer: Self-pay | Admitting: *Deleted

## 2014-12-09 ENCOUNTER — Encounter: Payer: Self-pay | Admitting: Nurse Practitioner

## 2014-12-09 VITALS — BP 128/59 | HR 91 | Temp 98.0°F | Resp 18 | Ht 61.25 in | Wt 231.5 lb

## 2014-12-09 VITALS — BP 108/69 | HR 86 | Temp 98.2°F | Resp 18

## 2014-12-09 DIAGNOSIS — C50311 Malignant neoplasm of lower-inner quadrant of right female breast: Secondary | ICD-10-CM

## 2014-12-09 DIAGNOSIS — Z5111 Encounter for antineoplastic chemotherapy: Secondary | ICD-10-CM | POA: Diagnosis not present

## 2014-12-09 DIAGNOSIS — Z171 Estrogen receptor negative status [ER-]: Secondary | ICD-10-CM

## 2014-12-09 DIAGNOSIS — R739 Hyperglycemia, unspecified: Secondary | ICD-10-CM | POA: Insufficient documentation

## 2014-12-09 DIAGNOSIS — I82C12 Acute embolism and thrombosis of left internal jugular vein: Secondary | ICD-10-CM

## 2014-12-09 DIAGNOSIS — Z5189 Encounter for other specified aftercare: Secondary | ICD-10-CM | POA: Diagnosis not present

## 2014-12-09 LAB — CBC WITH DIFFERENTIAL/PLATELET
BASO%: 0.1 % (ref 0.0–2.0)
Basophils Absolute: 0 10*3/uL (ref 0.0–0.1)
EOS ABS: 0 10*3/uL (ref 0.0–0.5)
EOS%: 0 % (ref 0.0–7.0)
HCT: 44.2 % (ref 34.8–46.6)
HGB: 14.6 g/dL (ref 11.6–15.9)
LYMPH%: 8 % — ABNORMAL LOW (ref 14.0–49.7)
MCH: 28.6 pg (ref 25.1–34.0)
MCHC: 33 g/dL (ref 31.5–36.0)
MCV: 86.5 fL (ref 79.5–101.0)
MONO#: 1.2 10*3/uL — ABNORMAL HIGH (ref 0.1–0.9)
MONO%: 4.4 % (ref 0.0–14.0)
NEUT#: 23.4 10*3/uL — ABNORMAL HIGH (ref 1.5–6.5)
NEUT%: 87.5 % — AB (ref 38.4–76.8)
Platelets: 234 10*3/uL (ref 145–400)
RBC: 5.11 10*6/uL (ref 3.70–5.45)
RDW: 14.6 % — ABNORMAL HIGH (ref 11.2–14.5)
WBC: 26.7 10*3/uL — ABNORMAL HIGH (ref 3.9–10.3)
lymph#: 2.1 10*3/uL (ref 0.9–3.3)

## 2014-12-09 LAB — COMPREHENSIVE METABOLIC PANEL (CC13)
ALBUMIN: 3.2 g/dL — AB (ref 3.5–5.0)
ALT: 34 U/L (ref 0–55)
AST: 18 U/L (ref 5–34)
Alkaline Phosphatase: 59 U/L (ref 40–150)
Anion Gap: 16 mEq/L — ABNORMAL HIGH (ref 3–11)
BILIRUBIN TOTAL: 0.36 mg/dL (ref 0.20–1.20)
BUN: 15.9 mg/dL (ref 7.0–26.0)
CALCIUM: 9 mg/dL (ref 8.4–10.4)
CO2: 15 mEq/L — ABNORMAL LOW (ref 22–29)
Chloride: 110 mEq/L — ABNORMAL HIGH (ref 98–109)
Creatinine: 1 mg/dL (ref 0.6–1.1)
EGFR: 69 mL/min/{1.73_m2} — AB (ref >90)
Glucose: 227 mg/dl — ABNORMAL HIGH (ref 70–140)
POTASSIUM: 3.9 meq/L (ref 3.5–5.1)
Sodium: 141 mEq/L (ref 136–145)
TOTAL PROTEIN: 6.5 g/dL (ref 6.4–8.3)

## 2014-12-09 MED ORDER — SODIUM CHLORIDE 0.9 % IV SOLN
600.0000 mg/m2 | Freq: Once | INTRAVENOUS | Status: AC
  Administered 2014-12-09: 1280 mg via INTRAVENOUS
  Filled 2014-12-09: qty 64

## 2014-12-09 MED ORDER — SODIUM CHLORIDE 0.9 % IJ SOLN
10.0000 mL | INTRAMUSCULAR | Status: DC | PRN
  Administered 2014-12-09: 10 mL
  Filled 2014-12-09: qty 10

## 2014-12-09 MED ORDER — DEXTROSE 5 % IV SOLN
75.0000 mg/m2 | Freq: Once | INTRAVENOUS | Status: AC
  Administered 2014-12-09: 160 mg via INTRAVENOUS
  Filled 2014-12-09: qty 16

## 2014-12-09 MED ORDER — SODIUM CHLORIDE 0.9 % IV SOLN
Freq: Once | INTRAVENOUS | Status: AC
  Administered 2014-12-09: 11:00:00 via INTRAVENOUS

## 2014-12-09 MED ORDER — SODIUM CHLORIDE 0.9 % IV SOLN
Freq: Once | INTRAVENOUS | Status: AC
  Administered 2014-12-09: 12:00:00 via INTRAVENOUS
  Filled 2014-12-09: qty 8

## 2014-12-09 MED ORDER — HEPARIN SOD (PORK) LOCK FLUSH 100 UNIT/ML IV SOLN
500.0000 [IU] | Freq: Once | INTRAVENOUS | Status: AC | PRN
  Administered 2014-12-09: 500 [IU]
  Filled 2014-12-09: qty 5

## 2014-12-09 MED ORDER — PEGFILGRASTIM 6 MG/0.6ML ~~LOC~~ PSKT
6.0000 mg | PREFILLED_SYRINGE | Freq: Once | SUBCUTANEOUS | Status: AC
  Administered 2014-12-09: 6 mg via SUBCUTANEOUS
  Filled 2014-12-09: qty 0.6

## 2014-12-09 NOTE — Progress Notes (Signed)
[place]  [phone] [phone]     ID: Jordan [name] DOB: [birth year]  MR#: [id number]  VOZ#:[id number]  Patient Care Team: Fara Chute, PA-C as PCP - General (Physician Assistant) [name], MD as Consulting Physician (General Surgery) Chauncey Cruel, MD as Consulting Physician (Oncology) Arloa Koh, MD as Consulting Physician (Radiation Oncology) Mauro Kaufmann, RN as Registered Nurse Rockwell Germany, RN as Registered Nurse Holley Bouche, NP as Nurse Practitioner (Nurse Practitioner) PCP: [name] OTHER MD: [name] MD  CHIEF COMPLAINT: Early stage triple negative breast cancer  CURRENT TREATMENT: adjuvant chemotherapy   BREAST CANCER HISTORY: From the original intake note:  "Jordan [name]" [facility] 2015 showing breast density category B. A possible mass in the right breast was noted and on Jerry 20 03/05/2015 the patient underwent right diagnostic mammography with right ultrasonography. Spot compression views confirmed a slightly irregular mass in the lower inner quadrant of the right breast. This was not palpable by physical exam. Ultrasound showed a small hypoechoic mass measuring 5 mm in the area in question. Ultrasound of the right axilla was unremarkable.  Biopsy of the mass in question fibrin 05/06/2015 showed ([id number]) and invasive ductal carcinoma, grade 2, estrogen and progesterone receptor negative, with an MIB-1 of 41%, and no HER-2 amplification.  Bilateral breast MRI was obtained 10/04/2014. Results are pending.  The patient's subsequent history is as detailed below   INTERVAL HISTORY:  Jordan [name] returns today for follow up of her breast cancer. Today she is to begin cyclophosphamide and docetaxel given every 3 weeks 4 with Neulasta support. She was hospitalized early last week because of a left IJ DVT, and was placed on xarelto at discharge.  Her left arm is less sore and is no longer swollen.   REVIEW OF SYSTEMS: Jordan [name] feels well today and has no complaints to offer. A detailed review of systems is entirely negative, except where noted above.   PAST MEDICAL HISTORY: Past Medical History  Diagnosis Date  . Hyperlipidemia     takes Zetia and Zocor daily  . Thyroid disease   . Nasal congestion   . Leg swelling   . Constipation   . Nausea   . Generalized headaches     due to allergies, sinus  . PONV (postoperative nausea and vomiting)     pt states she is very easy to sedate  . Hypertension     takes Maxzide and Metoprolol daily  . Pneumonia     walking about 6-39yr ago  . History of bronchitis     last time about 6-772yrago  . Hx of seasonal allergies     takes OTC allergy med nightly  . History of migraine     last one about 15+yrs ago  . Dizziness     r/t side effects from meds  . Vertigo     takes Meclizine prn  . Joint pain   . Back pain     buldging disc  . GERD (gastroesophageal reflux disease)     takes Protonix as needed  . Hemorrhoids   . History of colon polyps   . Mass of colon   . Cancer     colon  . History of UTI   . Hypothyroidism     takes Synthroid daily  . Depression     takes Wellbutrin daily  . Wears glasses     PAST SURGICAL  HISTORY: Past Surgical History  Procedure Laterality Date  . Cesarean section  1968, 1970  . Knee surgery  1998    right - arthroscopic  . Abdominal hysterectomy    . Exploratory laparotomy    . Carpal tunnel release    . Colonoscopy    . Esophagogastroduodenoscopy    . Partial colectomy  03/30/2012    Procedure: PARTIAL COLECTOMY;  Surgeon: [name], MD;  Location: [facility];  Service: General;  Laterality: N/A;  . Appendectomy    . Radioactive seed guided mastectomy with axillary sentinel lymph node biopsy Right 10/17/2014    Procedure: RADIOACTIVE SEED GUIDED PARTIAL MASTECTOMY WITH AXILLARY SENTINEL LYMPH NODE BIOPSY;  Surgeon: [name], MD;   Location: [facility];  Service: General;  Laterality: Right;    FAMILY HISTORY Family History  Problem Relation Age of Onset  . Cancer Father 34    prostate  . Cancer Brother 50    colon  . Deep vein thrombosis Brother   . Cancer Maternal Aunt 55    breast  . Cancer Maternal Grandmother 47    breast  . Cancer Paternal Grandmother     colon  . Pulmonary embolism Brother     long-distance truck driver (PE x 2)  . Cancer Brother 46    colon  . Diabetes Maternal Grandfather   . Cancer Maternal Aunt 75    unknown type  . Mental illness Sister     history of Depression  . Seizures Sister 8    s/p traumatic brain injury  . Cancer Maternal Uncle     prostate   the patient's parents are still living, as of February 2016. Her father is 7 years old, with a history of prostate cancer diagnosed in his late 65s. The patient's mother is 1 years old. The patient had 5 brothers, 3 sisters. One brother died with prostate cancer at age 80. Another brother has a history of prostate cancer, age 28. One brother had colon cancer diagnosed age 4 as did a paternal grandmother. A maternal grandmother had breast cancer at age 29 as did a maternal aunt (diagnosed age 36.) A cousin was diagnosed with breast cancer the age of 100.  GYNECOLOGIC HISTORY:  No LMP recorded. Patient has had a hysterectomy. Menarche age 77, first live birth age 59. The patient is GX P1. She stopped having periods in 1981 when she underwent hysterectomy and unilateral salpingo-oophorectomy.. She did not take hormone replacement.  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 Her husband Jordan [name] owns a business that [employer] at night. The patient's biological son Jordan [name] works in Biomedical scientist in [place]. He has 2 children. One of them, the patient's granddaughter Jordan [name], 57 years old, lives with the patient, and is a Interior and spatial designer at [employer]. The patient also has an adopted son,  Jordan [name], lives in [place]. Jordan [name] attends a city of [employer] church in [place] where she grew up    ADVANCED DIRECTIVES: Not in place  HEALTH MAINTENANCE: History  Substance Use Topics  . Smoking status: Never Smoker   . Smokeless tobacco: Never Used  . Alcohol Use: No     Colonoscopy: 03/30/2013/[facility]  PAP:  Bone density:  Lipid panel:  No Known Allergies  Current Outpatient Prescriptions  Medication Sig Dispense Refill  . buPROPion (WELLBUTRIN XL) 150 MG 24 hr tablet Take 1 tablet (150 mg total) by mouth daily. 30 tablet 5  . dexamethasone (DECADRON) 4 MG  tablet Take 2 tablets (8 mg total) by mouth 2 (two) times daily. Start the day before Taxotere. Then again the day after chemo for 3 days. 30 tablet 1  . ezetimibe (ZETIA) 10 MG tablet Take 1 tablet (10 mg total) by mouth daily. 30 tablet 5  . levothyroxine (SYNTHROID, LEVOTHROID) 125 MCG tablet TAKE 1 TABLET BY MOUTH DAILY 90 tablet 1  . lidocaine-prilocaine (EMLA) cream Apply to affected area once 30 g 3  . loratadine (CLARITIN) 10 MG tablet Take 1 tablet (10 mg total) by mouth daily. 30 tablet 4  . LORazepam (ATIVAN) 0.5 MG tablet Take 1 tablet (0.5 mg total) by mouth at bedtime as needed (Nausea or vomiting). 30 tablet 0  . metoprolol succinate (TOPROL-XL) 25 MG 24 hr tablet Take 1 tablet (25 mg total) by mouth daily. 30 tablet 5  . ondansetron (ZOFRAN) 8 MG tablet Take 1 tablet (8 mg total) by mouth 2 (two) times daily. Start the day after chemo for 3 days. Then take as needed for nausea or vomiting. 30 tablet 1  . Prenatal Multivit-Min-Fe-FA (PRENATAL VITAMINS PO) Take by mouth.    . rivaroxaban (XARELTO) 15 MG TABS tablet Take 1 tablet (15 mg total) by mouth 2 (two) times daily with a meal. 42 tablet 0  . simvastatin (ZOCOR) 40 MG tablet Take 1 tablet (40 mg total) by mouth at bedtime. 30 tablet 5  . aspirin 81 MG tablet Take 81 mg by mouth daily.    . meclizine (ANTIVERT) 25 MG tablet Take 1 tablet (25 mg total) by  mouth 4 (four) times daily as needed for dizziness. (Patient not taking: Reported on 12/02/2014) 30 tablet 1  . meloxicam (MOBIC) 15 MG tablet Take 1 tablet (15 mg total) by mouth daily. (Patient not taking: Reported on 12/09/2014) 30 tablet 1  . nitroGLYCERIN (NITROSTAT) 0.4 MG SL tablet Place 1 tablet (0.4 mg total) under the tongue every 5 (five) minutes as needed for chest pain. (Patient not taking: Reported on 12/09/2014) 30 tablet 1  . oxyCODONE-acetaminophen (ROXICET) 5-325 MG per tablet Take 1-2 tablets by mouth every 4 (four) hours as needed. (Patient not taking: Reported on 12/02/2014) 50 tablet 0  . prochlorperazine (COMPAZINE) 10 MG tablet Take 1 tablet (10 mg total) by mouth every 6 (six) hours as needed (Nausea or vomiting). (Patient not taking: Reported on 12/09/2014) 30 tablet 1  . tobramycin-dexamethasone (TOBRADEX) ophthalmic solution Place 1 drop into both eyes 2 (two) times daily. (Patient not taking: Reported on 12/09/2014) 5 mL 0  . triamterene-hydrochlorothiazide (MAXZIDE-25) 37.5-25 MG per tablet TAKE 1 TABLET DAILY (Patient not taking: Reported on 12/02/2014) 30 tablet 12   No current facility-administered medications for this visit.   Facility-Administered Medications Ordered in Other Visits  Medication Dose Route Frequency Provider Last Rate Last Dose  . chlorhexidine (HIBICLENS) 4 % liquid 1 application  1 application Topical Once [name], MD      . chlorhexidine (HIBICLENS) 4 % liquid 1 application  1 application Topical Once [name], MD        OBJECTIVE: Middle-aged African-American woman who appears stated age 39 Vitals:   12/09/14 0942  BP: 128/59  Pulse: 91  Temp: 98 F (36.7 C)  Resp: 18     Body mass index is 43.37 kg/(m^2).    ECOG FS:1 - Symptomatic but completely ambulatory  Skin: warm, dry  HEENT: sclerae anicteric, conjunctivae pink, oropharynx clear. No thrush or mucositis.  Lymph Nodes: No cervical or  supraclavicular lymphadenopathy    Lungs: clear to auscultation bilaterally, no rales, wheezes, or rhonci  Heart: regular rate and rhythm  Abdomen: round, soft, non tender, positive bowel sounds  Musculoskeletal: No focal spinal tenderness, no peripheral edema  Neuro: non focal, well oriented, positive affect  Breasts: deferred  LAB RESULTS:  CMP     Component Value Date/Time   NA 141 12/09/2014 0921   NA 144 12/03/2014 0340   K 3.9 12/09/2014 0921   K 4.0 12/03/2014 0340   CL 108 12/03/2014 0340   CO2 15* 12/09/2014 0921   CO2 24 12/03/2014 0340   GLUCOSE 227* 12/09/2014 0921   GLUCOSE 105* 12/03/2014 0340   BUN 15.9 12/09/2014 0921   BUN 13 12/03/2014 0340   CREATININE 1.0 12/09/2014 0921   CREATININE 0.98 12/03/2014 0340   CREATININE 1.04 08/20/2014 1238   CALCIUM 9.0 12/09/2014 0921   CALCIUM 8.9 12/03/2014 0340   PROT 6.5 12/09/2014 0921   PROT 6.9 08/20/2014 1238   ALBUMIN 3.2* 12/09/2014 0921   ALBUMIN 3.9 08/20/2014 1238   AST 18 12/09/2014 0921   AST 23 08/20/2014 1238   ALT 34 12/09/2014 0921   ALT 24 08/20/2014 1238   ALKPHOS 59 12/09/2014 0921   ALKPHOS 54 08/20/2014 1238   BILITOT 0.36 12/09/2014 0921   BILITOT 0.6 08/20/2014 1238   GFRNONAA 59* 12/03/2014 0340   GFRAA 68* 12/03/2014 0340    INo results found for: SPEP, UPEP  Lab Results  Component Value Date   WBC 26.7* 12/09/2014   NEUTROABS 23.4* 12/09/2014   HGB 14.6 12/09/2014   HCT 44.2 12/09/2014   MCV 86.5 12/09/2014   PLT 234 12/09/2014      Chemistry      Component Value Date/Time   NA 141 12/09/2014 0921   NA 144 12/03/2014 0340   K 3.9 12/09/2014 0921   K 4.0 12/03/2014 0340   CL 108 12/03/2014 0340   CO2 15* 12/09/2014 0921   CO2 24 12/03/2014 0340   BUN 15.9 12/09/2014 0921   BUN 13 12/03/2014 0340   CREATININE 1.0 12/09/2014 0921   CREATININE 0.98 12/03/2014 0340   CREATININE 1.04 08/20/2014 1238      Component Value Date/Time   CALCIUM 9.0 12/09/2014 0921   CALCIUM 8.9 12/03/2014 0340   ALKPHOS  59 12/09/2014 0921   ALKPHOS 54 08/20/2014 1238   AST 18 12/09/2014 0921   AST 23 08/20/2014 1238   ALT 34 12/09/2014 0921   ALT 24 08/20/2014 1238   BILITOT 0.36 12/09/2014 0921   BILITOT 0.6 08/20/2014 1238       No results found for: LABCA2  No components found for: LABCA125   Recent Labs Lab 12/04/14 0919  INR 1.44    Urinalysis    Component Value Date/Time   COLORURINE YELLOW 03/09/2011 0857   APPEARANCEUR CLOUDY* 03/09/2011 0857   LABSPEC 1.022 03/09/2011 0857   PHURINE 5.5 03/09/2011 0857   GLUCOSEU NEGATIVE 03/09/2011 0857   HGBUR NEGATIVE 03/09/2011 0857   BILIRUBINUR SMALL* 03/09/2011 0857   KETONESUR NEGATIVE 03/09/2011 0857   PROTEINUR NEGATIVE 03/09/2011 0857   UROBILINOGEN 0.2 03/09/2011 0857   NITRITE NEGATIVE 03/09/2011 0857   LEUKOCYTESUR LARGE* 03/09/2011 0857    STUDIES: Ir Fluoro Guide Cv Line Left  11/14/2014   CLINICAL DATA:  64 year old female with right-sided breast cancer. She requires durable central venous access for chemotherapy. A left IJ approach port catheter will be placed.  EXAM: IR LEFT FLOURO GUIDE CV LINE;  IR ULTRASOUND GUIDANCE VASC ACCESS LEFT  Date: 11/14/2014  ANESTHESIA/SEDATION: Moderate (conscious) sedation was administered during this procedure. A total of 1.5 mg Versed and 75 mg Fentanyl were administered intravenously. The patient's vital signs were monitored continuously by radiology nursing throughout the course of the procedure.  Total sedation time: 36 minutes  FLUOROSCOPY TIME:  12 seconds  18 mGy  TECHNIQUE: The left neck and chest was prepped with chlorhexidine, and draped in the usual sterile fashion using maximum barrier technique (cap and mask, sterile gown, sterile gloves, large sterile sheet, hand hygiene and cutaneous antiseptic). Antibiotic prophylaxis was provided with 2g Ancef administered IV one hour prior to skin incision. Local anesthesia was attained by infiltration with 1% lidocaine with epinephrine.   Ultrasound demonstrated patency of the left internal jugular vein, and this was documented with an image. Under real-time ultrasound guidance, this vein was accessed with a 21 gauge micropuncture needle and image documentation was performed. A small dermatotomy was made at the access site with an 11 scalpel. A 0.018" wire was advanced into the SVC and the access needle exchanged for a 74F micropuncture vascular sheath. The 0.018" wire was then removed and a 0.035" wire advanced into the IVC.  An appropriate location for the subcutaneous reservoir was selected below the clavicle and an incision was made through the skin and underlying soft tissues. The subcutaneous tissues were then dissected using a combination of blunt and sharp surgical technique and a pocket was formed. A single lumen power injectable portacatheter was then tunneled through the subcutaneous tissues from the pocket to the dermatotomy and the port reservoir placed within the subcutaneous pocket.  The venous access site was then serially dilated and a peel away vascular sheath placed over the wire. The wire was removed and the port catheter advanced into position under fluoroscopic guidance. The catheter tip is positioned in the upper right atrium. This was documented with a spot image. The portacatheter was then tested and found to flush and aspirate well. The port was flushed with saline followed by 100 units/mL heparinized saline.  The pocket was then closed in two layers using first subdermal inverted interrupted absorbable sutures followed by a running subcuticular suture. The epidermis was then sealed with Dermabond. The dermatotomy at the venous access site was also sealed with Dermabond.  COMPLICATIONS: None.  The patient tolerated the procedure well.  IMPRESSION: Successful placement of a left IJ approach Power Port with ultrasound and fluoroscopic guidance. The catheter is ready for use.  Signed,  Criselda Peaches, MD  [facility]  [facility]   Electronically Signed   By: Jacqulynn Cadet M.D.   On: 11/14/2014 14:43   Ir US Guide Vasc Access Left  11/14/2014   CLINICAL DATA:  64 year old female with right-sided breast cancer. She requires durable central venous access for chemotherapy. A left IJ approach port catheter will be placed.  EXAM: IR LEFT FLOURO GUIDE CV LINE; IR ULTRASOUND GUIDANCE VASC ACCESS LEFT  Date: 11/14/2014  ANESTHESIA/SEDATION: Moderate (conscious) sedation was administered during this procedure. A total of 1.5 mg Versed and 75 mg Fentanyl were administered intravenously. The patient's vital signs were monitored continuously by radiology nursing throughout the course of the procedure.  Total sedation time: 36 minutes  FLUOROSCOPY TIME:  12 seconds  18 mGy  TECHNIQUE: The left neck and chest was prepped with chlorhexidine, and draped in the usual sterile fashion using maximum barrier technique (cap and mask, sterile gown, sterile gloves,  large sterile sheet, hand hygiene and cutaneous antiseptic). Antibiotic prophylaxis was provided with 2g Ancef administered IV one hour prior to skin incision. Local anesthesia was attained by infiltration with 1% lidocaine with epinephrine.  Ultrasound demonstrated patency of the left internal jugular vein, and this was documented with an image. Under real-time ultrasound guidance, this vein was accessed with a 21 gauge micropuncture needle and image documentation was performed. A small dermatotomy was made at the access site with an 11 scalpel. A 0.018" wire was advanced into the SVC and the access needle exchanged for a 49F micropuncture vascular sheath. The 0.018" wire was then removed and a 0.035" wire advanced into the IVC.  An appropriate location for the subcutaneous reservoir was selected below the clavicle and an incision was made through the skin and underlying soft tissues. The subcutaneous tissues were then dissected using a  combination of blunt and sharp surgical technique and a pocket was formed. A single lumen power injectable portacatheter was then tunneled through the subcutaneous tissues from the pocket to the dermatotomy and the port reservoir placed within the subcutaneous pocket.  The venous access site was then serially dilated and a peel away vascular sheath placed over the wire. The wire was removed and the port catheter advanced into position under fluoroscopic guidance. The catheter tip is positioned in the upper right atrium. This was documented with a spot image. The portacatheter was then tested and found to flush and aspirate well. The port was flushed with saline followed by 100 units/mL heparinized saline.  The pocket was then closed in two layers using first subdermal inverted interrupted absorbable sutures followed by a running subcuticular suture. The epidermis was then sealed with Dermabond. The dermatotomy at the venous access site was also sealed with Dermabond.  COMPLICATIONS: None.  The patient tolerated the procedure well.  IMPRESSION: Successful placement of a left IJ approach Power Port with ultrasound and fluoroscopic guidance. The catheter is ready for use.  Signed,  Criselda Peaches, MD  [facility]  [facility]   Electronically Signed   By: Jacqulynn Cadet M.D.   On: 11/14/2014 14:43    ASSESSMENT: 64 y.o.  [place] woman status post right breast lower inner quadrant biopsy 09/24/2014 for a clinical T1a N0, stage IA invasive ductal carcinoma, grade 1 or 2, estrogen and progesterone receptor negative, with an MIB-1 of 41%, and HER-2 negative   (1) status post right lumpectomy and axillary sentinel lymph node sampling 10/17/2014 for a pT1b pN0, stage IA  invasive ductal carcinoma, grade 3, repeat HER-2 again negative.   (2) adjuvant chemotherapy to consist of cyclophosphamide and docetaxel given every 3 weeks 4 with Neulasta support   (3)  adjuvant radiation to follow chemotherapy   (4) status post partial right colectomy with lymphadenectomy 03/30/2012 for a 2.7 cm tubular adenoma with high-grade dysplasia, no evidence of invasion, 0 of 11 lymph nodes involved ([id number])  (5) genetics testing sent 10/04/2014 through the [employer] gene panel /[facility] found no deleterious mutations in ATM, BARD1, BRCA1, BRCA2, BRIP1, CDH1, CHEK2, EPCAM, MLH1, MRE11A, MSH2, MSH6, MUTYH, NBN, NF1, PALB2, PMS2, PTEN, RAD50, RAD51C, RAD51D, SMARCA4, STK11, or TP53  (a) two variants of uncertain significance were found    (i) ATM, p.[id number]   (ii) SMARCA4, p.[id number]  (6) left IJ DVT 12/02/14, started on xarelto  (7) elevated blood glucose. A1c on 12/03/14 was 6.4.  PLAN: Jordan [name] is ready to begin chemo today. The labs were reviewed in  detail and were relatively stable. She has some continued leukocytosis since her hospitalization for the DVT and her blood sugar is mildly elevated. She is on steroids of course starting yesterday, and her last A1c was 6.4. We will continue to monitor her blood sugars, and make the necessary adjustments to her seroids in the future if necessary.   Jordan [name] will return next week for labs and a nadir visit. She understands and agrees with this plan. She knows the goal of treatment in her case is cure. She has been encouraged to call with any issues that might arise before her next visit here.   Laurie Panda, NP   12/09/2014 10:22 AM

## 2014-12-09 NOTE — Progress Notes (Signed)
Met with pt during 1st chemotherapy treatment. Relate she is doing well. Denies needs or concerns at this time. Encourage pt to call with questions. Received verbal understanding.

## 2014-12-09 NOTE — Patient Instructions (Signed)
[facility] Discharge Instructions for Patients Receiving Chemotherapy  Today you received the following chemotherapy agents Taxotere and Cytoxan  To help prevent nausea and vomiting after your treatment, we encourage you to take your nausea medication Zofran and Compazine as directed   If you develop nausea and vomiting that is not controlled by your nausea medication, call the clinic.   BELOW ARE SYMPTOMS THAT SHOULD BE REPORTED IMMEDIATELY:  *FEVER GREATER THAN 100.5 F  *CHILLS WITH OR WITHOUT FEVER  NAUSEA AND VOMITING THAT IS NOT CONTROLLED WITH YOUR NAUSEA MEDICATION  *UNUSUAL SHORTNESS OF BREATH  *UNUSUAL BRUISING OR BLEEDING  TENDERNESS IN MOUTH AND THROAT WITH OR WITHOUT PRESENCE OF ULCERS  *URINARY PROBLEMS  *BOWEL PROBLEMS  UNUSUAL RASH Items with * indicate a potential emergency and should be followed up as soon as possible.  Feel free to call the clinic you have any questions or concerns. The clinic phone number is [phone].  Please show the [facility] at check-in to the Emergency Department and triage nurse.

## 2014-12-09 NOTE — Telephone Encounter (Signed)
Appointments made and avs printed for patient °

## 2014-12-09 NOTE — Discharge Summary (Signed)
[facility] Discharge Summary  Patient name: Jordan Mathena DonahueMedical record number: [id number] Date of birth: May 13, 1952Age: 64 y.o.Gender: female Date of Admission: 4/18/2016Date of Discharge: 12/04/14 Admitting Physician: Alveda Reasons, MD  Primary Care Provider: JEFFERY,CHELLE, PA-C Consultants: Interventional Radiology, Hematology/Oncology  Indication for Hospitalization: DVT  Discharge Diagnoses/Problem List:  DVT Breast Cancer Hypertension Hypothyroidism Depression  Disposition: Discharge Home  Discharge Condition: Stable  Discharge Exam: Please refer to Progress Note from 12/04/14  Brief Hospital Course:  Jordan [name] is a 64yo female who presented to the Emergency Department on 4/18 with increased tenderness and swelling in her left upper arm. History of Breast Cancer with Port-A-Cath placement on 11/14/14. Was going to first chemotherapy treatment on 4/18 when swelling was noted; subsequently sent for Dopplers, which showed left internal jugular vein DVT. Leukocytosis of 26.8 noted. Glucose noted to be 216. Admitted to [facility] Teaching [facility].   Heparin drip initiated at admission. Home medications, including Metoprolol, Bupropion, Ativan PRN, Zetia, Simvastatin, and Synthroid, were continued during hospitalization. Interventional Radiology contacted concerning use of Port-A-Cath and recommended flushing catheter to ensure it could still be used. IV team contacted and flushed catheter BID throughout hospitalization and good blood return observed. Hematology/Oncology contacted and etiology of hypercoagulability discussed. Due to lumpectomy without concern for metastasis, it was felt that the DVT was secondary to catheter placement and not malignancy. Jordan [name] was transitioned off of heparin drip to Xarelto on 12/03/14. Pharmacy recommended dose of Xarelto 15mg  BID for 21 days and then  transitioning to 20mg  daily on 12/25/14. Care Management was contacted concerning affordability of Xarelto after discharge.  Due to elevated blood sugars at admission, Jordan [name] was initiated on sensitive insulin sliding scale and A1C was obtained. Suspected to be secondary to dexamethasone taken prior to anticipated chemotherapy on day of admission. A1C 6.4 and glucose improved during hospitalization.  Jordan [name] was discharged on 12/04/14 following initiation of anticoagulation and improvement in left arm swelling and pain. Leukocytosis improved to 13.3 prior to discharge.  Issues for Follow Up:  1. Monitor left arm edema and tenderness for continued improvement 2. Discharged with Xarelto. Continue 15mg  BID dosing for 21 days. Will transition to 20mg  daily dosing on 12/25/14. Needs prescription for daily dosing prior to 5/11. 3. Chemotherapy delayed until 12/09/14 4. A1C 6.4. Continue to monitor and counsel on diet and exercise.  Significant Procedures: None  Significant Labs and Imaging:   Last Labs      Recent Labs Lab 12/02/14 1055 12/03/14 0340 12/04/14 0520  WBC 26.8* 23.9* 13.3*  HGB 14.6 13.7 12.5  HCT 46.8* 41.3 39.1  PLT 310 276 255      Last Labs      Recent Labs Lab 12/02/14 1055 12/03/14 0340  NA 144 144  K 3.6 4.0  CL --  108  CO2 20* 24  GLUCOSE 216* 105*  BUN 13.2 13  CREATININE 1.1 0.98  CALCIUM 9.3 8.9  ALKPHOS 62 --   AST 22 --   ALT 29 --   ALBUMIN 3.3* --     - A1C 6.4  Results/Tests Pending at Time of Discharge: None  Discharge Medications:    Medication List    TAKE these medications       aspirin 81 MG tablet  Take 81 mg by mouth daily.     buPROPion 150 MG 24 hr tablet  Commonly known as: WELLBUTRIN XL  Take 1 tablet (150 mg total) by mouth daily.  dexamethasone 4 MG tablet  Commonly known as: DECADRON  Take 2 tablets (8 mg total) by  mouth 2 (two) times daily. Start the day before Taxotere. Then again the day after chemo for 3 days.     ezetimibe 10 MG tablet  Commonly known as: ZETIA  Take 1 tablet (10 mg total) by mouth daily.     levothyroxine 125 MCG tablet  Commonly known as: SYNTHROID, LEVOTHROID  TAKE 1 TABLET BY MOUTH DAILY     lidocaine-prilocaine cream  Commonly known as: EMLA  Apply to affected area once     loratadine 10 MG tablet  Commonly known as: CLARITIN  Take 1 tablet (10 mg total) by mouth daily.     LORazepam 0.5 MG tablet  Commonly known as: ATIVAN  Take 1 tablet (0.5 mg total) by mouth at bedtime as needed (Nausea or vomiting).     meclizine 25 MG tablet  Commonly known as: ANTIVERT  Take 1 tablet (25 mg total) by mouth 4 (four) times daily as needed for dizziness.     meloxicam 15 MG tablet  Commonly known as: MOBIC  Take 1 tablet (15 mg total) by mouth daily.     metoprolol succinate 25 MG 24 hr tablet  Commonly known as: TOPROL-XL  Take 1 tablet (25 mg total) by mouth daily.     nitroGLYCERIN 0.4 MG SL tablet  Commonly known as: NITROSTAT  Place 1 tablet (0.4 mg total) under the tongue every 5 (five) minutes as needed for chest pain.     ondansetron 8 MG tablet  Commonly known as: ZOFRAN  Take 1 tablet (8 mg total) by mouth 2 (two) times daily. Start the day after chemo for 3 days. Then take as needed for nausea or vomiting.     oxyCODONE-acetaminophen 5-325 MG per tablet  Commonly known as: ROXICET  Take 1-2 tablets by mouth every 4 (four) hours as needed.     PRENATAL VITAMINS PO  Take by mouth.     prochlorperazine 10 MG tablet  Commonly known as: COMPAZINE  Take 1 tablet (10 mg total) by mouth every 6 (six) hours as needed (Nausea or vomiting).     Rivaroxaban 15 MG Tabs tablet  Commonly known as: XARELTO  Take 1 tablet (15 mg total) by mouth 2 (two) times daily with a meal.      simvastatin 40 MG tablet  Commonly known as: ZOCOR  Take 1 tablet (40 mg total) by mouth at bedtime.     tobramycin-dexamethasone ophthalmic solution  Commonly known as: TOBRADEX  Place 1 drop into both eyes 2 (two) times daily.     triamterene-hydrochlorothiazide 37.5-25 MG per tablet  Commonly known as: MAXZIDE-25  TAKE 1 TABLET DAILY        Discharge Instructions: Please refer to Patient Instructions section of EMR for full details. Patient was counseled important signs and symptoms that should prompt return to medical care, changes in medications, dietary instructions, activity restrictions, and follow up appointments.   Follow-Up Appointments:     Follow-up Information    Follow up with JEFFERY,CHELLE, PA-C. Schedule an appointment as soon as possible for a visit in 1 week.   Specialty: Physician Assistant   Why: Hospital Follow Up, Prescription for Xarelto   Contact information:   [place] [address] [phone]       Lorna Few, [place] 12/06/2014, 10:43 PM PGY-1, [place]

## 2014-12-10 ENCOUNTER — Telehealth: Payer: Self-pay | Admitting: *Deleted

## 2014-12-10 NOTE — Telephone Encounter (Signed)
-----   Message from Cora Collum, RN sent at 12/09/2014 11:33 AM EDT ----- Regarding: chemo follow up call...Marland KitchenGM Contact: [phone]  1st time cytoxan and taxotere

## 2014-12-10 NOTE — Telephone Encounter (Signed)
No answer/busy

## 2014-12-16 ENCOUNTER — Other Ambulatory Visit (HOSPITAL_BASED_OUTPATIENT_CLINIC_OR_DEPARTMENT_OTHER): Payer: Medicare PPO

## 2014-12-16 ENCOUNTER — Ambulatory Visit (HOSPITAL_BASED_OUTPATIENT_CLINIC_OR_DEPARTMENT_OTHER): Payer: Medicare PPO | Admitting: Nurse Practitioner

## 2014-12-16 ENCOUNTER — Encounter: Payer: Self-pay | Admitting: Nurse Practitioner

## 2014-12-16 VITALS — BP 113/78 | HR 100 | Temp 98.0°F | Resp 19 | Ht 61.25 in | Wt 222.6 lb

## 2014-12-16 DIAGNOSIS — R7309 Other abnormal glucose: Secondary | ICD-10-CM

## 2014-12-16 DIAGNOSIS — G4700 Insomnia, unspecified: Secondary | ICD-10-CM

## 2014-12-16 DIAGNOSIS — E860 Dehydration: Secondary | ICD-10-CM

## 2014-12-16 DIAGNOSIS — I82C12 Acute embolism and thrombosis of left internal jugular vein: Secondary | ICD-10-CM | POA: Diagnosis not present

## 2014-12-16 DIAGNOSIS — C50311 Malignant neoplasm of lower-inner quadrant of right female breast: Secondary | ICD-10-CM | POA: Diagnosis not present

## 2014-12-16 DIAGNOSIS — R110 Nausea: Secondary | ICD-10-CM

## 2014-12-16 DIAGNOSIS — T451X5A Adverse effect of antineoplastic and immunosuppressive drugs, initial encounter: Secondary | ICD-10-CM | POA: Insufficient documentation

## 2014-12-16 DIAGNOSIS — K5900 Constipation, unspecified: Secondary | ICD-10-CM | POA: Insufficient documentation

## 2014-12-16 LAB — CBC WITH DIFFERENTIAL/PLATELET
HEMATOCRIT: 47 % — AB (ref 34.8–46.6)
HGB: 16 g/dL — ABNORMAL HIGH (ref 11.6–15.9)
MCH: 28.4 pg (ref 25.1–34.0)
MCHC: 34 g/dL (ref 31.5–36.0)
MCV: 83.3 fL (ref 79.5–101.0)
Platelets: 153 10*3/uL (ref 145–400)
RBC: 5.64 10*6/uL — ABNORMAL HIGH (ref 3.70–5.45)
RDW: 14.1 % (ref 11.2–14.5)
WBC: 12.9 10*3/uL — ABNORMAL HIGH (ref 3.9–10.3)

## 2014-12-16 LAB — MANUAL DIFFERENTIAL
ALC: 6.3 10*3/uL — ABNORMAL HIGH (ref 0.9–3.3)
ANC (CHCC manual diff): 4.9 10*3/uL (ref 1.5–6.5)
BASOPHIL: 0 % (ref 0–2)
Band Neutrophils: 10 % (ref 0–10)
Blasts: 3 % — ABNORMAL HIGH (ref 0–0)
EOS%: 2 % (ref 0–7)
LYMPH: 49 % (ref 14–49)
METAMYELOCYTES PCT: 9 % — AB (ref 0–0)
MONO: 8 % (ref 0–14)
MYELOCYTES: 14 % — AB (ref 0–0)
Other Cell: 0 % (ref 0–0)
PLT EST: ADEQUATE
PROMYELO: 0 % (ref 0–0)
SEG: 5 % — ABNORMAL LOW (ref 38–77)
Variant Lymph: 0 % (ref 0–0)
nRBC: 3 % — ABNORMAL HIGH (ref 0–0)

## 2014-12-16 LAB — COMPREHENSIVE METABOLIC PANEL (CC13)
ALK PHOS: 84 U/L (ref 40–150)
ALT: 55 U/L (ref 0–55)
AST: 27 U/L (ref 5–34)
Albumin: 3.4 g/dL — ABNORMAL LOW (ref 3.5–5.0)
Anion Gap: 10 mEq/L (ref 3–11)
BILIRUBIN TOTAL: 1.15 mg/dL (ref 0.20–1.20)
BUN: 21.9 mg/dL (ref 7.0–26.0)
CHLORIDE: 104 meq/L (ref 98–109)
CO2: 24 mEq/L (ref 22–29)
Calcium: 8.9 mg/dL (ref 8.4–10.4)
Creatinine: 1 mg/dL (ref 0.6–1.1)
EGFR: 70 mL/min/{1.73_m2} — ABNORMAL LOW (ref >90)
Glucose: 105 mg/dl (ref 70–140)
Potassium: 3.6 mEq/L (ref 3.5–5.1)
Sodium: 138 mEq/L (ref 136–145)
TOTAL PROTEIN: 6.5 g/dL (ref 6.4–8.3)

## 2014-12-16 NOTE — Progress Notes (Signed)
[facility]  [phone] [phone]) [phone]     ID: Jordan [name] DOB: July 10, 1951  MR#: [id number]  NIO#:[id number]  Patient Care Team: Fara Chute, PA-C as PCP - General (Physician Assistant) [name], MD as Consulting Physician (General Surgery) Chauncey Cruel, MD as Consulting Physician (Oncology) Arloa Koh, MD as Consulting Physician (Radiation Oncology) Mauro Kaufmann, RN as Registered Nurse Rockwell Germany, RN as Registered Nurse Holley Bouche, NP as Nurse Practitioner (Nurse Practitioner) PCP: [name] OTHER MD: [name] MD  CHIEF COMPLAINT: Early stage triple negative breast cancer  CURRENT TREATMENT: adjuvant chemotherapy   BREAST CANCER HISTORY: From the original intake note:  "Jordan [name]" had bilateral [facility] July [facility] 2015 showing breast density category B. A possible mass in the right breast was noted and on Jerry 20 03/05/2015 the patient underwent right diagnostic mammography with right ultrasonography. Spot compression views confirmed a slightly irregular mass in the lower inner quadrant of the right breast. This was not palpable by physical exam. Ultrasound showed a small hypoechoic mass measuring 5 mm in the area in question. Ultrasound of the right axilla was unremarkable.  Biopsy of the mass in question fibrin 05/06/2015 showed ([id number]) and invasive ductal carcinoma, grade 2, estrogen and progesterone receptor negative, with [name] MIB-1 of 41%, and no HER-2 amplification.  Bilateral breast MRI was obtained 10/04/2014. Results are pending.  The patient's subsequent history is as detailed below   INTERVAL HISTORY:  Jordan [name] returns today for follow up of her breast cancer. Today is day 8, cycle 1 of cyclophosphamide and docetaxel given every 3 weeks 4 with Neulasta support.  REVIEW OF SYSTEMS: Jordan [name] has a host of complaints today, overall she is not feeling well. She  is suffering immense bone pain and leg weakness, despite taking claratin for 3 days as advised. She having left clavicle pain, likely from her port placement. She has a small rash to her left chest wall that does not hurt or itch. Her appetite is poor secondary to nausea and she is not taking in many fluids. She has not thrown up. She was having frequent stools but now is "blocked up." she is having right upper quadrant pain. She had intense hot flashes on day 4, but these resolved. She can't sleep at night. A detailed review of systems is otherwise stable.  PAST MEDICAL HISTORY: Past Medical History  Diagnosis Date  . Hyperlipidemia     takes Zetia and Zocor daily  . Thyroid disease   . Nasal congestion   . Leg swelling   . Constipation   . Nausea   . Generalized headaches     due to allergies, sinus  . PONV (postoperative nausea and vomiting)     pt states she is very easy to sedate  . Hypertension     takes Maxzide and Metoprolol daily  . Pneumonia     walking about 6-94yr ago  . History of bronchitis     last time about 6-772yrago  . Hx of seasonal allergies     takes OTC allergy med nightly  . History of migraine     last one about 15+yrs ago  . Dizziness     r/t side effects from meds  . Vertigo     takes Meclizine prn  . Joint pain   . Back pain     buldging disc  . GERD (gastroesophageal reflux disease)  takes Protonix as needed  . Hemorrhoids   . History of colon polyps   . Mass of colon   . Cancer     colon  . History of UTI   . Hypothyroidism     takes Synthroid daily  . Depression     takes Wellbutrin daily  . Wears glasses     PAST SURGICAL HISTORY: Past Surgical History  Procedure Laterality Date  . Cesarean section  1968, 1970  . Knee surgery  1998    right - arthroscopic  . Abdominal hysterectomy    . Exploratory laparotomy    . Carpal tunnel release    . Colonoscopy    . Esophagogastroduodenoscopy    . Partial colectomy  03/30/2012     Procedure: PARTIAL COLECTOMY;  Surgeon: [name], MD;  Location: [facility];  Service: General;  Laterality: N/A;  . Appendectomy    . Radioactive seed guided mastectomy with axillary sentinel lymph node biopsy Right 10/17/2014    Procedure: RADIOACTIVE SEED GUIDED PARTIAL MASTECTOMY WITH AXILLARY SENTINEL LYMPH NODE BIOPSY;  Surgeon: [name], MD;  Location: [facility];  Service: General;  Laterality: Right;    FAMILY HISTORY Family History  Problem Relation Age of Onset  . Cancer Father 75    prostate  . Cancer Brother 79    colon  . Deep vein thrombosis Brother   . Cancer Maternal Aunt 59    breast  . Cancer Maternal Grandmother 14    breast  . Cancer Paternal Grandmother     colon  . Pulmonary embolism Brother     long-distance truck driver (PE x 2)  . Cancer Brother 40    colon  . Diabetes Maternal Grandfather   . Cancer Maternal Aunt 75    unknown type  . Mental illness Sister     history of Depression  . Seizures Sister 8    s/p traumatic brain injury  . Cancer Maternal Uncle     prostate   the patient's parents are still living, as of February 2016. Her father is 19 years old, with a history of prostate cancer diagnosed in his late 36s. The patient's mother is 70 years old. The patient had 5 brothers, 3 sisters. One brother died with prostate cancer at age 12. Another brother has a history of prostate cancer, age 12. One brother had colon cancer diagnosed age 30 as did a paternal grandmother. A maternal grandmother had breast cancer at age 81 as did a maternal aunt (diagnosed age 45.) A cousin was diagnosed with breast cancer the age of 17.  GYNECOLOGIC HISTORY:  No LMP recorded. Patient has had a hysterectomy. Menarche age 29, first live birth age 50. The patient is GX P1. She stopped having periods in 1981 when she underwent hysterectomy and unilateral salpingo-oophorectomy.. She did not take hormone replacement.  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 Her husband Jordan [name] owns a business that [employer] at night. The patient's biological son Jordan [name] works in Biomedical scientist in [place]. He has 2 children. One of them, the patient's granddaughter Jordan [name], 76 years old, lives with the patient, and is a Interior and spatial designer at [employer]. The patient also has [name] adopted son, Jordan [name], lives in [place]. Jordan [name] attends a city of refugee church in [place] where she grew up    ADVANCED DIRECTIVES: Not in place  HEALTH MAINTENANCE: History  Substance Use Topics  . Smoking status: Never Smoker   .  Smokeless tobacco: Never Used  . Alcohol Use: No     Colonoscopy: 03/30/2013/[facility]  PAP:  Bone density:  Lipid panel:  No Known Allergies  Current Outpatient Prescriptions  Medication Sig Dispense Refill  . aspirin 81 MG tablet Take 81 mg by mouth daily.    Marland Kitchen buPROPion (WELLBUTRIN XL) 150 MG 24 hr tablet Take 1 tablet (150 mg total) by mouth daily. 30 tablet 5  . ezetimibe (ZETIA) 10 MG tablet Take 1 tablet (10 mg total) by mouth daily. 30 tablet 5  . levothyroxine (SYNTHROID, LEVOTHROID) 125 MCG tablet TAKE 1 TABLET BY MOUTH DAILY 90 tablet 1  . loratadine (CLARITIN) 10 MG tablet Take 1 tablet (10 mg total) by mouth daily. 30 tablet 4  . LORazepam (ATIVAN) 0.5 MG tablet Take 1 tablet (0.5 mg total) by mouth at bedtime as needed (Nausea or vomiting). 30 tablet 0  . meloxicam (MOBIC) 15 MG tablet Take 1 tablet (15 mg total) by mouth daily. 30 tablet 1  . metoprolol succinate (TOPROL-XL) 25 MG 24 hr tablet Take 1 tablet (25 mg total) by mouth daily. 30 tablet 5  . ondansetron (ZOFRAN) 8 MG tablet Take 1 tablet (8 mg total) by mouth 2 (two) times daily. Start the day after chemo for 3 days. Then take as needed for nausea or vomiting. 30 tablet 1  . oxyCODONE-acetaminophen (ROXICET) 5-325 MG per tablet Take 1-2 tablets by mouth every 4 (four) hours as needed. 50 tablet 0  . Prenatal Multivit-Min-Fe-FA (PRENATAL  VITAMINS PO) Take by mouth.    . rivaroxaban (XARELTO) 15 MG TABS tablet Take 1 tablet (15 mg total) by mouth 2 (two) times daily with a meal. 42 tablet 0  . simvastatin (ZOCOR) 40 MG tablet Take 1 tablet (40 mg total) by mouth at bedtime. 30 tablet 5  . dexamethasone (DECADRON) 4 MG tablet Take 2 tablets (8 mg total) by mouth 2 (two) times daily. Start the day before Taxotere. Then again the day after chemo for 3 days. (Patient not taking: Reported on 12/16/2014) 30 tablet 1  . lidocaine-prilocaine (EMLA) cream Apply to affected area once (Patient not taking: Reported on 12/16/2014) 30 g 3  . meclizine (ANTIVERT) 25 MG tablet Take 1 tablet (25 mg total) by mouth 4 (four) times daily as needed for dizziness. (Patient not taking: Reported on 12/02/2014) 30 tablet 1  . nitroGLYCERIN (NITROSTAT) 0.4 MG SL tablet Place 1 tablet (0.4 mg total) under the tongue every 5 (five) minutes as needed for chest pain. (Patient not taking: Reported on 12/09/2014) 30 tablet 1  . prochlorperazine (COMPAZINE) 10 MG tablet Take 1 tablet (10 mg total) by mouth every 6 (six) hours as needed (Nausea or vomiting). (Patient not taking: Reported on 12/09/2014) 30 tablet 1  . tobramycin-dexamethasone (TOBRADEX) ophthalmic solution Place 1 drop into both eyes 2 (two) times daily. (Patient not taking: Reported on 12/09/2014) 5 mL 0  . triamterene-hydrochlorothiazide (MAXZIDE-25) 37.5-25 MG per tablet TAKE 1 TABLET DAILY (Patient not taking: Reported on 12/02/2014) 30 tablet 12   No current facility-administered medications for this visit.   Facility-Administered Medications Ordered in Other Visits  Medication Dose Route Frequency Provider Last Rate Last Dose  . chlorhexidine (HIBICLENS) 4 % liquid 1 application  1 application Topical Once [name], MD      . chlorhexidine (HIBICLENS) 4 % liquid 1 application  1 application Topical Once [name], MD        OBJECTIVE: Middle-aged African-American woman who appears stated  age Danley Danker Vitals:   12/16/14 1435  BP: 113/78  Pulse: 100  Temp: 98 F (36.7 C)  Resp: 19     Body mass index is 41.7 kg/(m^2).    ECOG FS:1 - Symptomatic but completely ambulatory  Skin scattered papules to left upper chest Sclerae unicteric, pupils equal and reactive Oropharynx clear and moist-- no thrush No cervical or supraclavicular adenopathy Lungs no rales or rhonchi Heart regular rate and rhythm Abd soft, nontender, positive bowel sounds MSK no focal spinal tenderness, no upper extremity lymphedema Neuro: nonfocal, well oriented, appropriate affect Breasts: deferred  LAB RESULTS:  CMP     Component Value Date/Time   NA 138 12/16/2014 1421   NA 144 12/03/2014 0340   K 3.6 12/16/2014 1421   K 4.0 12/03/2014 0340   CL 108 12/03/2014 0340   CO2 24 12/16/2014 1421   CO2 24 12/03/2014 0340   GLUCOSE 105 12/16/2014 1421   GLUCOSE 105* 12/03/2014 0340   BUN 21.9 12/16/2014 1421   BUN 13 12/03/2014 0340   CREATININE 1.0 12/16/2014 1421   CREATININE 0.98 12/03/2014 0340   CREATININE 1.04 08/20/2014 1238   CALCIUM 8.9 12/16/2014 1421   CALCIUM 8.9 12/03/2014 0340   PROT 6.5 12/16/2014 1421   PROT 6.9 08/20/2014 1238   ALBUMIN 3.4* 12/16/2014 1421   ALBUMIN 3.9 08/20/2014 1238   AST 27 12/16/2014 1421   AST 23 08/20/2014 1238   ALT 55 12/16/2014 1421   ALT 24 08/20/2014 1238   ALKPHOS 84 12/16/2014 1421   ALKPHOS 54 08/20/2014 1238   BILITOT 1.15 12/16/2014 1421   BILITOT 0.6 08/20/2014 1238   GFRNONAA 59* 12/03/2014 0340   GFRAA 68* 12/03/2014 0340    INo results found for: SPEP, UPEP  Lab Results  Component Value Date   WBC 12.9* 12/16/2014   NEUTROABS 23.4* 12/09/2014   HGB 16.0* 12/16/2014   HCT 47.0* 12/16/2014   MCV 83.3 12/16/2014   PLT 153 12/16/2014      Chemistry      Component Value Date/Time   NA 138 12/16/2014 1421   NA 144 12/03/2014 0340   K 3.6 12/16/2014 1421   K 4.0 12/03/2014 0340   CL 108 12/03/2014 0340   CO2 24 12/16/2014  1421   CO2 24 12/03/2014 0340   BUN 21.9 12/16/2014 1421   BUN 13 12/03/2014 0340   CREATININE 1.0 12/16/2014 1421   CREATININE 0.98 12/03/2014 0340   CREATININE 1.04 08/20/2014 1238      Component Value Date/Time   CALCIUM 8.9 12/16/2014 1421   CALCIUM 8.9 12/03/2014 0340   ALKPHOS 84 12/16/2014 1421   ALKPHOS 54 08/20/2014 1238   AST 27 12/16/2014 1421   AST 23 08/20/2014 1238   ALT 55 12/16/2014 1421   ALT 24 08/20/2014 1238   BILITOT 1.15 12/16/2014 1421   BILITOT 0.6 08/20/2014 1238       No results found for: LABCA2  No components found for: LABCA125  No results for input(s): INR in the last 168 hours.  Urinalysis    Component Value Date/Time   COLORURINE YELLOW 03/09/2011 0857   APPEARANCEUR CLOUDY* 03/09/2011 0857   LABSPEC 1.022 03/09/2011 0857   PHURINE 5.5 03/09/2011 0857   GLUCOSEU NEGATIVE 03/09/2011 0857   HGBUR NEGATIVE 03/09/2011 0857   BILIRUBINUR SMALL* 03/09/2011 0857   KETONESUR NEGATIVE 03/09/2011 0857   PROTEINUR NEGATIVE 03/09/2011 0857   UROBILINOGEN 0.2 03/09/2011 0857   NITRITE NEGATIVE 03/09/2011 0857   LEUKOCYTESUR LARGE* 03/09/2011  2440    STUDIES: No results found.  ASSESSMENT: 64 y.o.  [place] woman status post right breast lower inner quadrant biopsy 09/24/2014 for a clinical T1a N0, stage IA invasive ductal carcinoma, grade 1 or 2, estrogen and progesterone receptor negative, with [name] MIB-1 of 41%, and HER-2 negative   (1) status post right lumpectomy and axillary sentinel lymph node sampling 10/17/2014 for a pT1b pN0, stage IA  invasive ductal carcinoma, grade 3, repeat HER-2 again negative.   (2) adjuvant chemotherapy to consist of cyclophosphamide and docetaxel given every 3 weeks 4 with Neulasta support   (3) adjuvant radiation to follow chemotherapy   (4) status post partial right colectomy with lymphadenectomy 03/30/2012 for a 2.7 cm tubular adenoma with high-grade dysplasia, no evidence of invasion, 0 of 11 lymph  nodes involved ([id number])  (5) genetics testing sent 10/04/2014 through the [facility] gene panel /[facility] found no deleterious mutations in ATM, BARD1, BRCA1, BRCA2, BRIP1, CDH1, CHEK2, EPCAM, MLH1, MRE11A, MSH2, MSH6, MUTYH, NBN, NF1, PALB2, PMS2, PTEN, RAD50, RAD51C, RAD51D, SMARCA4, STK11, or TP53  (a) two variants of uncertain significance were found    (i) ATM, p.[id number]   (ii) SMARCA4, p.[id number]  (6) left IJ DVT 12/02/14, started on xarelto  (7) elevated blood glucose. A1c on 12/03/14 was 6.4.  PLAN: Jordan [name] is having a rough day. I advised she start on miralax and colace [name] attempt to relieve her constipation and possibly her abdominal pain. As far as her nausea, she believe she is taking everything as advised, but did not realize she could continue her antiemetics past the days written on her schedule. I advised that she go back to using the compazine every 6 hrs at the least, as zofran can contribute to constipation. I offered IV fluids this week, since she knows herself that she is not drinking well, but she declined, stating she wants to do as much as she can on her own. The clavicle pain is going to be related to the port, and the back and leg pain from the neulasta injection. She does not want to use the narcotics she has been prescribed, but didn't know that it was ok to take aleve, so she will start that tonight. She will use ativan tonight to help with sleep, which she had not tried previously.   Jordan [name] will return in 1 week for labs and a follow up visit with Dr. Jana Hakim. She will next be treated in 2 weeks. She understands and agrees with this plan. She knows the goal of treatment in her case is cure. She has been encouraged to call with any issues that might arise before her next visit here.    Laurie Panda, NP   12/16/2014 4:25 PM

## 2014-12-23 ENCOUNTER — Other Ambulatory Visit (HOSPITAL_BASED_OUTPATIENT_CLINIC_OR_DEPARTMENT_OTHER): Payer: Medicare PPO

## 2014-12-23 ENCOUNTER — Ambulatory Visit: Payer: Medicare PPO

## 2014-12-23 ENCOUNTER — Other Ambulatory Visit: Payer: Self-pay | Admitting: *Deleted

## 2014-12-23 ENCOUNTER — Ambulatory Visit (HOSPITAL_BASED_OUTPATIENT_CLINIC_OR_DEPARTMENT_OTHER): Payer: Medicare PPO | Admitting: Oncology

## 2014-12-23 ENCOUNTER — Other Ambulatory Visit: Payer: Medicare PPO

## 2014-12-23 VITALS — BP 114/79 | HR 78 | Temp 98.0°F | Resp 18 | Ht 61.25 in | Wt 226.9 lb

## 2014-12-23 DIAGNOSIS — T451X5A Adverse effect of antineoplastic and immunosuppressive drugs, initial encounter: Secondary | ICD-10-CM

## 2014-12-23 DIAGNOSIS — C50311 Malignant neoplasm of lower-inner quadrant of right female breast: Secondary | ICD-10-CM

## 2014-12-23 DIAGNOSIS — I82C12 Acute embolism and thrombosis of left internal jugular vein: Secondary | ICD-10-CM | POA: Diagnosis not present

## 2014-12-23 DIAGNOSIS — D509 Iron deficiency anemia, unspecified: Secondary | ICD-10-CM

## 2014-12-23 DIAGNOSIS — R110 Nausea: Secondary | ICD-10-CM

## 2014-12-23 LAB — CBC WITH DIFFERENTIAL/PLATELET
BASO%: 0.2 % (ref 0.0–2.0)
BASOS ABS: 0.1 10*3/uL (ref 0.0–0.1)
EOS%: 0 % (ref 0.0–7.0)
Eosinophils Absolute: 0 10*3/uL (ref 0.0–0.5)
HCT: 45.3 % (ref 34.8–46.6)
HEMOGLOBIN: 14.4 g/dL (ref 11.6–15.9)
LYMPH%: 10 % — AB (ref 14.0–49.7)
MCH: 27.4 pg (ref 25.1–34.0)
MCHC: 31.7 g/dL (ref 31.5–36.0)
MCV: 86.3 fL (ref 79.5–101.0)
MONO#: 1.7 10*3/uL — ABNORMAL HIGH (ref 0.1–0.9)
MONO%: 3.2 % (ref 0.0–14.0)
NEUT%: 86.6 % — ABNORMAL HIGH (ref 38.4–76.8)
NEUTROS ABS: 47 10*3/uL — AB (ref 1.5–6.5)
PLATELETS: 112 10*3/uL — AB (ref 145–400)
RBC: 5.25 10*6/uL (ref 3.70–5.45)
RDW: 14.9 % — ABNORMAL HIGH (ref 11.2–14.5)
WBC: 54.2 10*3/uL (ref 3.9–10.3)
lymph#: 5.4 10*3/uL — ABNORMAL HIGH (ref 0.9–3.3)

## 2014-12-23 LAB — COMPREHENSIVE METABOLIC PANEL (CC13)
ALBUMIN: 3.2 g/dL — AB (ref 3.5–5.0)
ALT: 68 U/L — ABNORMAL HIGH (ref 0–55)
AST: 39 U/L — ABNORMAL HIGH (ref 5–34)
Alkaline Phosphatase: 130 U/L (ref 40–150)
Anion Gap: 14 mEq/L — ABNORMAL HIGH (ref 3–11)
BUN: 25.1 mg/dL (ref 7.0–26.0)
CO2: 20 mEq/L — ABNORMAL LOW (ref 22–29)
Calcium: 8.6 mg/dL (ref 8.4–10.4)
Chloride: 107 mEq/L (ref 98–109)
Creatinine: 1.2 mg/dL — ABNORMAL HIGH (ref 0.6–1.1)
EGFR: 56 mL/min/{1.73_m2} — AB (ref >90)
GLUCOSE: 128 mg/dL (ref 70–140)
POTASSIUM: 3.3 meq/L — AB (ref 3.5–5.1)
Sodium: 142 mEq/L (ref 136–145)
Total Bilirubin: 0.51 mg/dL (ref 0.20–1.20)
Total Protein: 5.8 g/dL — ABNORMAL LOW (ref 6.4–8.3)

## 2014-12-23 LAB — TECHNOLOGIST REVIEW: Technologist Review: 4

## 2014-12-23 NOTE — Progress Notes (Signed)
[place]  Telephone:([phone]) [phone] Fax:(336) [phone]     ID: [name] DOB: 1951-05-07  MR#: [id number]  XBW#:[id number]  Patient Care Team: Fara Chute, PA-C as PCP - General (Physician Assistant) [name], MD as Consulting Physician (General Surgery) Chauncey Cruel, MD as Consulting Physician (Oncology) Arloa Koh, MD as Consulting Physician (Radiation Oncology) Mauro Kaufmann, RN as Registered Nurse Rockwell Germany, RN as Registered Nurse Holley Bouche, NP as Nurse Practitioner (Nurse Practitioner) PCP: [name] OTHER MD: [name] MD  CHIEF COMPLAINT: Early stage triple negative breast cancer  CURRENT TREATMENT: adjuvant chemotherapy   BREAST CANCER HISTORY: From the original intake note:  "Jordan [name]" [facility] showing breast density category B. A possible mass in the right breast was noted and on Jerry 20 03/05/2015 the patient underwent right diagnostic mammography with right ultrasonography. Spot compression views confirmed a slightly irregular mass in the lower inner quadrant of the right breast. This was not palpable by physical exam. Ultrasound showed a small hypoechoic mass measuring 5 mm in the area in question. Ultrasound of the right axilla was unremarkable.  Biopsy of the mass in question fibrin 05/06/2015 showed ([id number]) and invasive ductal carcinoma, grade 2, estrogen and progesterone receptor negative, with an MIB-1 of 41%, and no HER-2 amplification.  Bilateral breast MRI was obtained 10/04/2014. Results are pending.  The patient's subsequent history is as detailed below   INTERVAL HISTORY:  Jordan [name] returns today for follow up of her breast cancer. Today is day 15, cycle 1 of 4 planned cycles of cyclophosphamide and docetaxel given every 3 weeks with Neulasta support.  REVIEW OF SYSTEMS: Jordan [name] had a little trouble with the carboplatin, but when  he was slowed down and she did fine. She tells me her sugars were fine. She never had any mouth sores although her mouth were sensitive. She does have altered taste. She is waking up and about 4 in the morning as some mornings. And then she stays in bed late red I suggested she go ahead and get up at 8 anyway even if she did not sleep well. Otherwise she will retrain her body and out mixed date time/nighttime cycle and that is much more difficult to deal with. She did get some arthritic pains from the chemotherapy. She is taking low back for that. She does not tolerate oxycodone. She was slightly constipated with her chemotherapy. She's had some ankle swelling. Her hot flashes were not more intense than before. Otherwise a detailed review of systems today was stable  PAST MEDICAL HISTORY: Past Medical History  Diagnosis Date  . Hyperlipidemia     takes Zetia and Zocor daily  . Thyroid disease   . Nasal congestion   . Leg swelling   . Constipation   . Nausea   . Generalized headaches     due to allergies, sinus  . PONV (postoperative nausea and vomiting)     pt states she is very easy to sedate  . Hypertension     takes Maxzide and Metoprolol daily  . Pneumonia     walking about 6-31yr ago  . History of bronchitis     last time about 6-729yrago  . Hx of seasonal allergies     takes OTC allergy med nightly  . History of migraine     last one about 15+yrs ago  . Dizziness     r/t side  effects from meds  . Vertigo     takes Meclizine prn  . Joint pain   . Back pain     buldging disc  . GERD (gastroesophageal reflux disease)     takes Protonix as needed  . Hemorrhoids   . History of colon polyps   . Mass of colon   . Cancer     colon  . History of UTI   . Hypothyroidism     takes Synthroid daily  . Depression     takes Wellbutrin daily  . Wears glasses     PAST SURGICAL HISTORY: Past Surgical History  Procedure Laterality Date  . Cesarean section  1968, 1970  . Knee  surgery  1998    right - arthroscopic  . Abdominal hysterectomy    . Exploratory laparotomy    . Carpal tunnel release    . Colonoscopy    . Esophagogastroduodenoscopy    . Partial colectomy  03/30/2012    Procedure: PARTIAL COLECTOMY;  Surgeon: [name], MD;  Location: [facility];  Service: General;  Laterality: N/A;  . Appendectomy    . Radioactive seed guided mastectomy with axillary sentinel lymph node biopsy Right 10/17/2014    Procedure: RADIOACTIVE SEED GUIDED PARTIAL MASTECTOMY WITH AXILLARY SENTINEL LYMPH NODE BIOPSY;  Surgeon: [name], MD;  Location: [facility];  Service: General;  Laterality: Right;    FAMILY HISTORY Family History  Problem Relation Age of Onset  . Cancer Father 25    prostate  . Cancer Brother 57    colon  . Deep vein thrombosis Brother   . Cancer Maternal Aunt 58    breast  . Cancer Maternal Grandmother 72    breast  . Cancer Paternal Grandmother     colon  . Pulmonary embolism Brother     long-distance truck driver (PE x 2)  . Cancer Brother 29    colon  . Diabetes Maternal Grandfather   . Cancer Maternal Aunt 75    unknown type  . Mental illness Sister     history of Depression  . Seizures Sister 8    s/p traumatic brain injury  . Cancer Maternal Uncle     prostate   the patient's parents are still living, as of February 2016. Her father is 76 years old, with a history of prostate cancer diagnosed in his late 60s. The patient's mother is 100 years old. The patient had 5 brothers, 3 sisters. One brother died with prostate cancer at age 72. Another brother has a history of prostate cancer, age 50. One brother had colon cancer diagnosed age 103 as did a paternal grandmother. A maternal grandmother had breast cancer at age 41 as did a maternal aunt (diagnosed age 58.) A cousin was diagnosed with breast cancer the age of 39.  GYNECOLOGIC HISTORY:  No LMP recorded. Patient has had a hysterectomy. Menarche age 14, first live birth  age 63. The patient is GX P1. She stopped having periods in 1981 when she underwent hysterectomy and unilateral salpingo-oophorectomy.. She did not take hormone replacement.  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 Her husband [name] owns a business that [employer] at night. The patient's biological son [name] works in Biomedical scientist in [place]. He has 2 children. One of them, the patient's granddaughter [name], 93 years old, lives with the patient, and is a Interior and spatial designer at [employer]. The patient also has an adopted son, [name], lives in [place].  Jordan [name] attends a [employer] church in [place] where she grew up    ADVANCED DIRECTIVES: Not in place  HEALTH MAINTENANCE: History  Substance Use Topics  . Smoking status: Never Smoker   . Smokeless tobacco: Never Used  . Alcohol Use: No     Colonoscopy: 03/30/2013/[facility]  PAP:  Bone density:  Lipid panel:  No Known Allergies  Current Outpatient Prescriptions  Medication Sig Dispense Refill  . aspirin 81 MG tablet Take 81 mg by mouth daily.    Marland Kitchen buPROPion (WELLBUTRIN XL) 150 MG 24 hr tablet Take 1 tablet (150 mg total) by mouth daily. 30 tablet 5  . dexamethasone (DECADRON) 4 MG tablet Take 2 tablets (8 mg total) by mouth 2 (two) times daily. Start the day before Taxotere. Then again the day after chemo for 3 days. (Patient not taking: Reported on 12/16/2014) 30 tablet 1  . ezetimibe (ZETIA) 10 MG tablet Take 1 tablet (10 mg total) by mouth daily. 30 tablet 5  . levothyroxine (SYNTHROID, LEVOTHROID) 125 MCG tablet TAKE 1 TABLET BY MOUTH DAILY 90 tablet 1  . lidocaine-prilocaine (EMLA) cream Apply to affected area once (Patient not taking: Reported on 12/16/2014) 30 g 3  . loratadine (CLARITIN) 10 MG tablet Take 1 tablet (10 mg total) by mouth daily. 30 tablet 4  . LORazepam (ATIVAN) 0.5 MG tablet Take 1 tablet (0.5 mg total) by mouth at bedtime as needed (Nausea or vomiting). 30 tablet 0  .  meclizine (ANTIVERT) 25 MG tablet Take 1 tablet (25 mg total) by mouth 4 (four) times daily as needed for dizziness. (Patient not taking: Reported on 12/02/2014) 30 tablet 1  . meloxicam (MOBIC) 15 MG tablet Take 1 tablet (15 mg total) by mouth daily. 30 tablet 1  . metoprolol succinate (TOPROL-XL) 25 MG 24 hr tablet Take 1 tablet (25 mg total) by mouth daily. 30 tablet 5  . nitroGLYCERIN (NITROSTAT) 0.4 MG SL tablet Place 1 tablet (0.4 mg total) under the tongue every 5 (five) minutes as needed for chest pain. (Patient not taking: Reported on 12/09/2014) 30 tablet 1  . ondansetron (ZOFRAN) 8 MG tablet Take 1 tablet (8 mg total) by mouth 2 (two) times daily. Start the day after chemo for 3 days. Then take as needed for nausea or vomiting. 30 tablet 1  . oxyCODONE-acetaminophen (ROXICET) 5-325 MG per tablet Take 1-2 tablets by mouth every 4 (four) hours as needed. 50 tablet 0  . Prenatal Multivit-Min-Fe-FA (PRENATAL VITAMINS PO) Take by mouth.    . prochlorperazine (COMPAZINE) 10 MG tablet Take 1 tablet (10 mg total) by mouth every 6 (six) hours as needed (Nausea or vomiting). (Patient not taking: Reported on 12/09/2014) 30 tablet 1  . rivaroxaban (XARELTO) 15 MG TABS tablet Take 1 tablet (15 mg total) by mouth 2 (two) times daily with a meal. 42 tablet 0  . simvastatin (ZOCOR) 40 MG tablet Take 1 tablet (40 mg total) by mouth at bedtime. 30 tablet 5  . tobramycin-dexamethasone (TOBRADEX) ophthalmic solution Place 1 drop into both eyes 2 (two) times daily. (Patient not taking: Reported on 12/09/2014) 5 mL 0  . triamterene-hydrochlorothiazide (MAXZIDE-25) 37.5-25 MG per tablet TAKE 1 TABLET DAILY (Patient not taking: Reported on 12/02/2014) 30 tablet 12   No current facility-administered medications for this visit.   Facility-Administered Medications Ordered in Other Visits  Medication Dose Route Frequency Provider Last Rate Last Dose  . chlorhexidine (HIBICLENS) 4 % liquid 1 application  1 application  Topical Once  [name], MD      . chlorhexidine (HIBICLENS) 4 % liquid 1 application  1 application Topical Once [name], MD        OBJECTIVE: Middle-aged African-American woman in no acute distress Filed Vitals:   12/23/14 0838  BP: 114/79  Pulse: 78  Temp: 98 F (36.7 C)  Resp: 18     Body mass index is 42.51 kg/(m^2).    ECOG FS:1 - Symptomatic but completely ambulatory  Sclerae unicteric, pupils round and equal Oropharynx clear and moist-- no thrush or other lesions No cervical or supraclavicular adenopathy Lungs no rales or rhonchi Heart regular rate and rhythm Abd soft, obese, nontender, positive bowel sounds MSK no focal spinal tenderness, no upper extremity lymphedema Neuro: nonfocal, well oriented, appropriate affect Breasts: Deferred   LAB RESULTS:  CMP     Component Value Date/Time   NA 142 12/23/2014 0808   NA 144 12/03/2014 0340   K 3.3* 12/23/2014 0808   K 4.0 12/03/2014 0340   CL 108 12/03/2014 0340   CO2 20* 12/23/2014 0808   CO2 24 12/03/2014 0340   GLUCOSE 128 12/23/2014 0808   GLUCOSE 105* 12/03/2014 0340   BUN 25.1 12/23/2014 0808   BUN 13 12/03/2014 0340   CREATININE 1.2* 12/23/2014 0808   CREATININE 0.98 12/03/2014 0340   CREATININE 1.04 08/20/2014 1238   CALCIUM 8.6 12/23/2014 0808   CALCIUM 8.9 12/03/2014 0340   PROT 5.8* 12/23/2014 0808   PROT 6.9 08/20/2014 1238   ALBUMIN 3.2* 12/23/2014 0808   ALBUMIN 3.9 08/20/2014 1238   AST 39* 12/23/2014 0808   AST 23 08/20/2014 1238   ALT 68* 12/23/2014 0808   ALT 24 08/20/2014 1238   ALKPHOS 130 12/23/2014 0808   ALKPHOS 54 08/20/2014 1238   BILITOT 0.51 12/23/2014 0808   BILITOT 0.6 08/20/2014 1238   GFRNONAA 59* 12/03/2014 0340   GFRAA 68* 12/03/2014 0340    INo results found for: SPEP, UPEP  Lab Results  Component Value Date   WBC 54.2* 12/23/2014   NEUTROABS 47.0* 12/23/2014   HGB 14.4 12/23/2014   HCT 45.3 12/23/2014   MCV 86.3 12/23/2014   PLT 112* 12/23/2014       Chemistry      Component Value Date/Time   NA 142 12/23/2014 0808   NA 144 12/03/2014 0340   K 3.3* 12/23/2014 0808   K 4.0 12/03/2014 0340   CL 108 12/03/2014 0340   CO2 20* 12/23/2014 0808   CO2 24 12/03/2014 0340   BUN 25.1 12/23/2014 0808   BUN 13 12/03/2014 0340   CREATININE 1.2* 12/23/2014 0808   CREATININE 0.98 12/03/2014 0340   CREATININE 1.04 08/20/2014 1238      Component Value Date/Time   CALCIUM 8.6 12/23/2014 0808   CALCIUM 8.9 12/03/2014 0340   ALKPHOS 130 12/23/2014 0808   ALKPHOS 54 08/20/2014 1238   AST 39* 12/23/2014 0808   AST 23 08/20/2014 1238   ALT 68* 12/23/2014 0808   ALT 24 08/20/2014 1238   BILITOT 0.51 12/23/2014 0808   BILITOT 0.6 08/20/2014 1238       No results found for: LABCA2  No components found for: [id number]  No results for input(s): INR in the last 168 hours.  Urinalysis    Component Value Date/Time   COLORURINE YELLOW 03/09/2011 0857   APPEARANCEUR CLOUDY* 03/09/2011 0857   LABSPEC 1.022 03/09/2011 0857   PHURINE 5.5 03/09/2011 0857   GLUCOSEU NEGATIVE 03/09/2011 0857   HGBUR  NEGATIVE 03/09/2011 0857   BILIRUBINUR SMALL* 03/09/2011 0857   KETONESUR NEGATIVE 03/09/2011 0857   PROTEINUR NEGATIVE 03/09/2011 0857   UROBILINOGEN 0.2 03/09/2011 0857   NITRITE NEGATIVE 03/09/2011 0857   LEUKOCYTESUR LARGE* 03/09/2011 0857    STUDIES: No results found.  ASSESSMENT: 64 y.o.  [place] woman status post right breast lower inner quadrant biopsy 09/24/2014 for a clinical T1a N0, stage IA invasive ductal carcinoma, grade 1 or 2, estrogen and progesterone receptor negative, with an MIB-1 of 41%, and HER-2 negative   (1) status post right lumpectomy and axillary sentinel lymph node sampling 10/17/2014 for a pT1b pN0, stage IA  invasive ductal carcinoma, grade 3, repeat HER-2 again negative.   (2) adjuvant chemotherapy to consist of cyclophosphamide and docetaxel given every 3 weeks 4 with Neulasta support   (3) adjuvant  radiation to follow chemotherapy   (4) status post partial right colectomy with lymphadenectomy 03/30/2012 for a 2.7 cm tubular adenoma with high-grade dysplasia, no evidence of invasion, 0 of 11 lymph nodes involved ([id number])  (5) genetics testing sent 10/04/2014 through the [facility] gene panel /[facility] found no deleterious mutations in ATM, BARD1, BRCA1, BRCA2, BRIP1, CDH1, CHEK2, EPCAM, MLH1, MRE11A, MSH2, MSH6, MUTYH, NBN, NF1, PALB2, PMS2, PTEN, RAD50, RAD51C, RAD51D, SMARCA4, STK11, or TP53  (a) two variants of uncertain significance were found    (i) ATM, p.[id number]   (ii) SMARCA4, p.[id number]  (6) left IJ DVT 12/02/14, started on xarelto  (7) elevated blood glucose. A1c on 12/03/14 was 6.4.  PLAN: Jordan [name] did remarkably well with her first cycle of chemotherapy. I am not making any changes in her doses except to note that she may want to have the carboplatin given more slowly since she had a little bit of difficulty with it with the first cycle. Hopefully that problem will not recur.  There are some issues related to family stresses and we can discuss that when she comes with her husband on a follow-up visit.  She is going to cut her hydrochlorothiazide in half to avoid dehydration and she's going to make an effort to hydrate herself aggressively the week after chemotherapy.  She has had mild acneiform rash which is going to be due to Decadron. If this persists we can try doxycycline daily.  She has finally lost her hair. I wrote her a prescription for cranial per ceases. She is going to investigate whether her insurance will pay for that.  She has a good understanding of the overall plan. She agrees with it. She knows the goal of treatment in her case is cure. She will call with any problems that may develop before her next visit here.  Chauncey Cruel, MD   12/23/2014 9:01 AM

## 2014-12-30 ENCOUNTER — Encounter: Payer: Self-pay | Admitting: Nurse Practitioner

## 2014-12-30 ENCOUNTER — Other Ambulatory Visit (HOSPITAL_BASED_OUTPATIENT_CLINIC_OR_DEPARTMENT_OTHER): Payer: Medicare PPO

## 2014-12-30 ENCOUNTER — Ambulatory Visit (HOSPITAL_BASED_OUTPATIENT_CLINIC_OR_DEPARTMENT_OTHER): Payer: Medicare PPO

## 2014-12-30 ENCOUNTER — Other Ambulatory Visit: Payer: Self-pay | Admitting: *Deleted

## 2014-12-30 ENCOUNTER — Other Ambulatory Visit: Payer: Self-pay | Admitting: Oncology

## 2014-12-30 ENCOUNTER — Telehealth: Payer: Self-pay | Admitting: Nurse Practitioner

## 2014-12-30 ENCOUNTER — Ambulatory Visit (HOSPITAL_BASED_OUTPATIENT_CLINIC_OR_DEPARTMENT_OTHER): Payer: Medicare PPO | Admitting: Nurse Practitioner

## 2014-12-30 VITALS — BP 118/68 | HR 99 | Temp 98.4°F | Resp 19 | Ht 61.25 in | Wt 229.2 lb

## 2014-12-30 DIAGNOSIS — Z171 Estrogen receptor negative status [ER-]: Secondary | ICD-10-CM | POA: Diagnosis not present

## 2014-12-30 DIAGNOSIS — Z5111 Encounter for antineoplastic chemotherapy: Secondary | ICD-10-CM | POA: Diagnosis not present

## 2014-12-30 DIAGNOSIS — C50311 Malignant neoplasm of lower-inner quadrant of right female breast: Secondary | ICD-10-CM | POA: Diagnosis not present

## 2014-12-30 DIAGNOSIS — Z5189 Encounter for other specified aftercare: Secondary | ICD-10-CM

## 2014-12-30 DIAGNOSIS — Z452 Encounter for adjustment and management of vascular access device: Secondary | ICD-10-CM

## 2014-12-30 DIAGNOSIS — I82C12 Acute embolism and thrombosis of left internal jugular vein: Secondary | ICD-10-CM | POA: Diagnosis not present

## 2014-12-30 DIAGNOSIS — M79671 Pain in right foot: Secondary | ICD-10-CM

## 2014-12-30 LAB — CBC WITH DIFFERENTIAL/PLATELET
BASO%: 0.3 % (ref 0.0–2.0)
BASOS ABS: 0.1 10*3/uL (ref 0.0–0.1)
EOS%: 0 % (ref 0.0–7.0)
Eosinophils Absolute: 0 10*3/uL (ref 0.0–0.5)
HEMATOCRIT: 42.4 % (ref 34.8–46.6)
HEMOGLOBIN: 13.4 g/dL (ref 11.6–15.9)
LYMPH%: 5.3 % — AB (ref 14.0–49.7)
MCH: 27.3 pg (ref 25.1–34.0)
MCHC: 31.7 g/dL (ref 31.5–36.0)
MCV: 86.3 fL (ref 79.5–101.0)
MONO#: 0.8 10*3/uL (ref 0.1–0.9)
MONO%: 2.8 % (ref 0.0–14.0)
NEUT#: 24.8 10*3/uL — ABNORMAL HIGH (ref 1.5–6.5)
NEUT%: 91.6 % — AB (ref 38.4–76.8)
PLATELETS: 339 10*3/uL (ref 145–400)
RBC: 4.92 10*6/uL (ref 3.70–5.45)
RDW: 16.1 % — ABNORMAL HIGH (ref 11.2–14.5)
WBC: 27.1 10*3/uL — ABNORMAL HIGH (ref 3.9–10.3)
lymph#: 1.4 10*3/uL (ref 0.9–3.3)

## 2014-12-30 LAB — COMPREHENSIVE METABOLIC PANEL (CC13)
ALBUMIN: 3.3 g/dL — AB (ref 3.5–5.0)
ALK PHOS: 75 U/L (ref 40–150)
ALT: 71 U/L — ABNORMAL HIGH (ref 0–55)
AST: 39 U/L — ABNORMAL HIGH (ref 5–34)
Anion Gap: 12 mEq/L — ABNORMAL HIGH (ref 3–11)
BILIRUBIN TOTAL: 0.5 mg/dL (ref 0.20–1.20)
BUN: 19 mg/dL (ref 7.0–26.0)
CO2: 21 mEq/L — ABNORMAL LOW (ref 22–29)
Calcium: 9 mg/dL (ref 8.4–10.4)
Chloride: 113 mEq/L — ABNORMAL HIGH (ref 98–109)
Creatinine: 1 mg/dL (ref 0.6–1.1)
EGFR: 68 mL/min/{1.73_m2} — ABNORMAL LOW (ref >90)
Glucose: 135 mg/dl (ref 70–140)
POTASSIUM: 3.7 meq/L (ref 3.5–5.1)
Sodium: 146 mEq/L — ABNORMAL HIGH (ref 136–145)
Total Protein: 6 g/dL — ABNORMAL LOW (ref 6.4–8.3)

## 2014-12-30 MED ORDER — SODIUM CHLORIDE 0.9 % IV SOLN
Freq: Once | INTRAVENOUS | Status: AC
  Administered 2014-12-30: 16:00:00 via INTRAVENOUS
  Filled 2014-12-30: qty 8

## 2014-12-30 MED ORDER — PEGFILGRASTIM 6 MG/0.6ML ~~LOC~~ PSKT
6.0000 mg | PREFILLED_SYRINGE | Freq: Once | SUBCUTANEOUS | Status: AC
  Administered 2014-12-30: 6 mg via SUBCUTANEOUS
  Filled 2014-12-30: qty 0.6

## 2014-12-30 MED ORDER — HEPARIN SOD (PORK) LOCK FLUSH 100 UNIT/ML IV SOLN
500.0000 [IU] | Freq: Once | INTRAVENOUS | Status: AC | PRN
  Administered 2014-12-30: 500 [IU]
  Filled 2014-12-30: qty 5

## 2014-12-30 MED ORDER — RIVAROXABAN 20 MG PO TABS: 20.0000 mg | ORAL_TABLET | Freq: Every day | ORAL | Status: DC

## 2014-12-30 MED ORDER — SODIUM CHLORIDE 0.9 % IV SOLN
Freq: Once | INTRAVENOUS | Status: AC
  Administered 2014-12-30: 16:00:00 via INTRAVENOUS

## 2014-12-30 MED ORDER — MELOXICAM 15 MG PO TABS: 15.0000 mg | ORAL_TABLET | Freq: Every day | ORAL | Status: DC

## 2014-12-30 MED ORDER — ALTEPLASE 2 MG IJ SOLR
2.0000 mg | Freq: Once | INTRAMUSCULAR | Status: AC | PRN
  Administered 2014-12-30: 2 mg
  Filled 2014-12-30: qty 2

## 2014-12-30 MED ORDER — LORAZEPAM 0.5 MG PO TABS: 0.5000 mg | ORAL_TABLET | Freq: Every evening | ORAL | Status: DC | PRN

## 2014-12-30 MED ORDER — TOBRAMYCIN-DEXAMETHASONE 0.3-0.1 % OP SUSP: 1.0000 [drp] | Freq: Two times a day (BID) | OPHTHALMIC | Status: DC

## 2014-12-30 MED ORDER — SODIUM CHLORIDE 0.9 % IJ SOLN
10.0000 mL | INTRAMUSCULAR | Status: DC | PRN
  Administered 2014-12-30: 10 mL
  Filled 2014-12-30: qty 10

## 2014-12-30 MED ORDER — SODIUM CHLORIDE 0.9 % IV SOLN
600.0000 mg/m2 | Freq: Once | INTRAVENOUS | Status: AC
  Administered 2014-12-30: 1280 mg via INTRAVENOUS
  Filled 2014-12-30: qty 64

## 2014-12-30 MED ORDER — DEXTROSE 5 % IV SOLN
50.0000 mg/m2 | Freq: Once | INTRAVENOUS | Status: AC
  Administered 2014-12-30: 110 mg via INTRAVENOUS
  Filled 2014-12-30: qty 11

## 2014-12-30 NOTE — Progress Notes (Signed)
[facility]  [phone] [phone]     ID: Jordan [name] DOB: [birth year]  MR#: [id number]  CVE#:[id number]  Patient Care Team: Daphane Shepherd, PA-C as PCP - General (Physician Assistant) [name], MD as Consulting Physician (General Surgery) Chauncey Cruel, MD as Consulting Physician (Oncology) Arloa Koh, MD as Consulting Physician (Radiation Oncology) Mauro Kaufmann, RN as Registered Nurse Rockwell Germany, RN as Registered Nurse Holley Bouche, NP as Nurse Practitioner (Nurse Practitioner) PCP: [name] OTHER MD: [name] MD  CHIEF COMPLAINT: Early stage triple negative breast cancer  CURRENT TREATMENT: adjuvant chemotherapy   BREAST CANCER HISTORY: From the original intake note:  "Jordan [name]" [facility] 2015 showing breast density category B. A possible mass in the right breast was noted and on Jerry 20 03/05/2015 the patient underwent right diagnostic mammography with right ultrasonography. Spot compression views confirmed a slightly irregular mass in the lower inner quadrant of the right breast. This was not palpable by physical exam. Ultrasound showed a small hypoechoic mass measuring 5 mm in the area in question. Ultrasound of the right axilla was unremarkable.  Biopsy of the mass in question fibrin 05/06/2015 showed ([id number]) and invasive ductal carcinoma, grade 2, estrogen and progesterone receptor negative, with an MIB-1 of 41%, and no HER-2 amplification.  Bilateral breast MRI was obtained 10/04/2014. Results are pending.  The patient's subsequent history is as detailed below   INTERVAL HISTORY:  Jordan [name] returns today for follow up of her breast cancer. Today is day 1, cycle 2 of cyclophosphamide and docetaxel given every 3 weeks 4 with Neulasta support.  REVIEW OF SYSTEMS: Jordan [name] denies fevers, chills, nausea, vomiting, or changes in bowel or bladder  habits. She is drinking more, and her appetite is fair despite taste changes. She does not sleep well, even with lorazepam. Her rash appeared on her right shoulder this time, but it too has dried up. Her bone pain is managed with mobic. She denies mouth sores or rashes. She has no shortness of breath, chest pain, cough, or palpitations. A detailed review of systems is otherwise stable.  PAST MEDICAL HISTORY: Past Medical History  Diagnosis Date  . Hyperlipidemia     takes Zetia and Zocor daily  . Thyroid disease   . Nasal congestion   . Leg swelling   . Constipation   . Nausea   . Generalized headaches     due to allergies, sinus  . PONV (postoperative nausea and vomiting)     pt states she is very easy to sedate  . Hypertension     takes Maxzide and Metoprolol daily  . Pneumonia     walking about 6-69yr ago  . History of bronchitis     last time about 6-758yrago  . Hx of seasonal allergies     takes OTC allergy med nightly  . History of migraine     last one about 15+yrs ago  . Dizziness     r/t side effects from meds  . Vertigo     takes Meclizine prn  . Joint pain   . Back pain     buldging disc  . GERD (gastroesophageal reflux disease)     takes Protonix as needed  . Hemorrhoids   . History of colon polyps   . Mass of colon   . Cancer     colon  . History of UTI   . Hypothyroidism  takes Synthroid daily  . Depression     takes Wellbutrin daily  . Wears glasses     PAST SURGICAL HISTORY: Past Surgical History  Procedure Laterality Date  . Cesarean section  1968, 1970  . Knee surgery  1998    right - arthroscopic  . Abdominal hysterectomy    . Exploratory laparotomy    . Carpal tunnel release    . Colonoscopy    . Esophagogastroduodenoscopy    . Partial colectomy  03/30/2012    Procedure: PARTIAL COLECTOMY;  Surgeon: [name], MD;  Location: [facility];  Service: General;  Laterality: N/A;  . Appendectomy    . Radioactive seed guided mastectomy with  axillary sentinel lymph node biopsy Right 10/17/2014    Procedure: RADIOACTIVE SEED GUIDED PARTIAL MASTECTOMY WITH AXILLARY SENTINEL LYMPH NODE BIOPSY;  Surgeon: [name], MD;  Location: [facility];  Service: General;  Laterality: Right;    FAMILY HISTORY Family History  Problem Relation Age of Onset  . Cancer Father 29    prostate  . Cancer Brother 21    colon  . Deep vein thrombosis Brother   . Cancer Maternal Aunt 9    breast  . Cancer Maternal Grandmother 9    breast  . Cancer Paternal Grandmother     colon  . Pulmonary embolism Brother     long-distance truck driver (PE x 2)  . Cancer Brother 21    colon  . Diabetes Maternal Grandfather   . Cancer Maternal Aunt 75    unknown type  . Mental illness Sister     history of Depression  . Seizures Sister 8    s/p traumatic brain injury  . Cancer Maternal Uncle     prostate   the patient's parents are still living, as of February 2016. Her father is 52 years old, with a history of prostate cancer diagnosed in his late 88s. The patient's mother is 32 years old. The patient had 5 brothers, 3 sisters. One brother died with prostate cancer at age 59. Another brother has a history of prostate cancer, age 70. One brother had colon cancer diagnosed age 16 as did a paternal grandmother. A maternal grandmother had breast cancer at age 60 as did a maternal aunt (diagnosed age 52.) A cousin was diagnosed with breast cancer the age of 27.  GYNECOLOGIC HISTORY:  No LMP recorded. Patient has had a hysterectomy. Menarche age 41, first live birth age 18. The patient is GX P1. She stopped having periods in 1981 when she underwent hysterectomy and unilateral salpingo-oophorectomy.. She did not take hormone replacement.  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 Her husband Jordan [name] owns a business that [employer] at night. The patient's biological son Jordan [name] works in Biomedical scientist in [place]. He has 2 children.  One of them, the patient's granddaughter Jordan [name], 14 years old, lives with the patient, and is a Interior and spatial designer at [employer]. The patient also has an adopted son, Jordan [name], lives in [place]. Jordan [name] attends a city of refugee church in [place] where she grew up    ADVANCED DIRECTIVES: Not in place  HEALTH MAINTENANCE: History  Substance Use Topics  . Smoking status: Never Smoker   . Smokeless tobacco: Never Used  . Alcohol Use: No     Colonoscopy: 03/30/2013/[facility]  PAP:  Bone density:  Lipid panel:  No Known Allergies  Current Outpatient Prescriptions  Medication Sig Dispense Refill  . buPROPion (WELLBUTRIN XL)  150 MG 24 hr tablet Take 1 tablet (150 mg total) by mouth daily. 30 tablet 5  . ezetimibe (ZETIA) 10 MG tablet Take 1 tablet (10 mg total) by mouth daily. 30 tablet 5  . levothyroxine (SYNTHROID, LEVOTHROID) 125 MCG tablet TAKE 1 TABLET BY MOUTH DAILY 90 tablet 1  . lidocaine-prilocaine (EMLA) cream Apply to affected area once 30 g 3  . LORazepam (ATIVAN) 0.5 MG tablet Take 1 tablet (0.5 mg total) by mouth at bedtime as needed (Nausea or vomiting). 30 tablet 0  . meloxicam (MOBIC) 15 MG tablet Take 1 tablet (15 mg total) by mouth daily. 30 tablet 0  . metoprolol succinate (TOPROL-XL) 25 MG 24 hr tablet Take 1 tablet (25 mg total) by mouth daily. 30 tablet 5  . ondansetron (ZOFRAN) 8 MG tablet Take 1 tablet (8 mg total) by mouth 2 (two) times daily. Start the day after chemo for 3 days. Then take as needed for nausea or vomiting. 30 tablet 1  . Prenatal Multivit-Min-Fe-FA (PRENATAL VITAMINS PO) Take by mouth.    . simvastatin (ZOCOR) 40 MG tablet Take 1 tablet (40 mg total) by mouth at bedtime. 30 tablet 5  . tobramycin-dexamethasone (TOBRADEX) ophthalmic solution Place 1 drop into both eyes 2 (two) times daily. 5 mL 0  . triamterene-hydrochlorothiazide (MAXZIDE-25) 37.5-25 MG per tablet TAKE 1 TABLET DAILY (Patient taking differently: 0.5 tablets. TAKE  1 TABLET DAILY) 30 tablet 12  . dexamethasone (DECADRON) 4 MG tablet Take 2 tablets (8 mg total) by mouth 2 (two) times daily. Start the day before Taxotere. Then again the day after chemo for 3 days. (Patient not taking: Reported on 12/16/2014) 30 tablet 1  . loratadine (CLARITIN) 10 MG tablet Take 1 tablet (10 mg total) by mouth daily. (Patient not taking: Reported on 12/30/2014) 30 tablet 4  . meclizine (ANTIVERT) 25 MG tablet Take 1 tablet (25 mg total) by mouth 4 (four) times daily as needed for dizziness. (Patient not taking: Reported on 12/02/2014) 30 tablet 1  . nitroGLYCERIN (NITROSTAT) 0.4 MG SL tablet Place 1 tablet (0.4 mg total) under the tongue every 5 (five) minutes as needed for chest pain. (Patient not taking: Reported on 12/09/2014) 30 tablet 1  . prochlorperazine (COMPAZINE) 10 MG tablet Take 1 tablet (10 mg total) by mouth every 6 (six) hours as needed (Nausea or vomiting). (Patient not taking: Reported on 12/09/2014) 30 tablet 1  . rivaroxaban (XARELTO) 20 MG TABS tablet Take 1 tablet (20 mg total) by mouth daily with supper. 30 tablet 3   No current facility-administered medications for this visit.   Facility-Administered Medications Ordered in Other Visits  Medication Dose Route Frequency Provider Last Rate Last Dose  . chlorhexidine (HIBICLENS) 4 % liquid 1 application  1 application Topical Once [name], MD      . chlorhexidine (HIBICLENS) 4 % liquid 1 application  1 application Topical Once [name], MD        OBJECTIVE: Middle-aged African-American woman who appears stated age 89 Vitals:   12/30/14 1324  BP: 118/68  Pulse: 99  Temp: 98.4 F (36.9 C)  Resp: 19     Body mass index is 42.94 kg/(m^2).    ECOG FS:1 - Symptomatic but completely ambulatory  Skin: warm, dry, scattered papules to right shoulder and left chest HEENT: sclerae anicteric, conjunctivae pink, oropharynx clear. No thrush or mucositis.  Lymph Nodes: No cervical or supraclavicular  lymphadenopathy  Lungs: clear to auscultation bilaterally, no rales, wheezes, or  rhonci  Heart: regular rate and rhythm  Abdomen: round, soft, non tender, positive bowel sounds  Musculoskeletal: No focal spinal tenderness, no peripheral edema  Neuro: non focal, well oriented, positive affect  Breasts: deferred  LAB RESULTS:  CMP     Component Value Date/Time   NA 146* 12/30/2014 1311   NA 144 12/03/2014 0340   K 3.7 12/30/2014 1311   K 4.0 12/03/2014 0340   CL 108 12/03/2014 0340   CO2 21* 12/30/2014 1311   CO2 24 12/03/2014 0340   GLUCOSE 135 12/30/2014 1311   GLUCOSE 105* 12/03/2014 0340   BUN 19.0 12/30/2014 1311   BUN 13 12/03/2014 0340   CREATININE 1.0 12/30/2014 1311   CREATININE 0.98 12/03/2014 0340   CREATININE 1.04 08/20/2014 1238   CALCIUM 9.0 12/30/2014 1311   CALCIUM 8.9 12/03/2014 0340   PROT 6.0* 12/30/2014 1311   PROT 6.9 08/20/2014 1238   ALBUMIN 3.3* 12/30/2014 1311   ALBUMIN 3.9 08/20/2014 1238   AST 39* 12/30/2014 1311   AST 23 08/20/2014 1238   ALT 71* 12/30/2014 1311   ALT 24 08/20/2014 1238   ALKPHOS 75 12/30/2014 1311   ALKPHOS 54 08/20/2014 1238   BILITOT 0.50 12/30/2014 1311   BILITOT 0.6 08/20/2014 1238   GFRNONAA 59* 12/03/2014 0340   GFRAA 68* 12/03/2014 0340    INo results found for: SPEP, UPEP  Lab Results  Component Value Date   WBC 27.1* 12/30/2014   NEUTROABS 24.8* 12/30/2014   HGB 13.4 12/30/2014   HCT 42.4 12/30/2014   MCV 86.3 12/30/2014   PLT 339 12/30/2014      Chemistry      Component Value Date/Time   NA 146* 12/30/2014 1311   NA 144 12/03/2014 0340   K 3.7 12/30/2014 1311   K 4.0 12/03/2014 0340   CL 108 12/03/2014 0340   CO2 21* 12/30/2014 1311   CO2 24 12/03/2014 0340   BUN 19.0 12/30/2014 1311   BUN 13 12/03/2014 0340   CREATININE 1.0 12/30/2014 1311   CREATININE 0.98 12/03/2014 0340   CREATININE 1.04 08/20/2014 1238      Component Value Date/Time   CALCIUM 9.0 12/30/2014 1311   CALCIUM 8.9  12/03/2014 0340   ALKPHOS 75 12/30/2014 1311   ALKPHOS 54 08/20/2014 1238   AST 39* 12/30/2014 1311   AST 23 08/20/2014 1238   ALT 71* 12/30/2014 1311   ALT 24 08/20/2014 1238   BILITOT 0.50 12/30/2014 1311   BILITOT 0.6 08/20/2014 1238       No results found for: LABCA2  No components found for: VQMGQ676  No results for input(s): INR in the last 168 hours.  Urinalysis    Component Value Date/Time   COLORURINE YELLOW 03/09/2011 0857   APPEARANCEUR CLOUDY* 03/09/2011 0857   LABSPEC 1.022 03/09/2011 0857   PHURINE 5.5 03/09/2011 0857   GLUCOSEU NEGATIVE 03/09/2011 0857   HGBUR NEGATIVE 03/09/2011 0857   BILIRUBINUR SMALL* 03/09/2011 0857   KETONESUR NEGATIVE 03/09/2011 0857   PROTEINUR NEGATIVE 03/09/2011 0857   UROBILINOGEN 0.2 03/09/2011 0857   NITRITE NEGATIVE 03/09/2011 0857   LEUKOCYTESUR LARGE* 03/09/2011 0857    STUDIES: No results found.  ASSESSMENT: 64 y.o.  [place] woman status post right breast lower inner quadrant biopsy 09/24/2014 for a clinical T1a N0, stage IA invasive ductal carcinoma, grade 1 or 2, estrogen and progesterone receptor negative, with an MIB-1 of 41%, and HER-2 negative   (1) status post right lumpectomy and axillary sentinel lymph node sampling  10/17/2014 for a pT1b pN0, stage IA  invasive ductal carcinoma, grade 3, repeat HER-2 again negative.   (2) adjuvant chemotherapy to consist of cyclophosphamide and docetaxel given every 3 weeks 4 with Neulasta support   (3) adjuvant radiation to follow chemotherapy   (4) status post partial right colectomy with lymphadenectomy 03/30/2012 for a 2.7 cm tubular adenoma with high-grade dysplasia, no evidence of invasion, 0 of 11 lymph nodes involved ([id number])  (5) genetics testing sent 10/04/2014 through the [facility] gene panel /[facility] found no deleterious mutations in ATM, BARD1, BRCA1, BRCA2, BRIP1, CDH1, CHEK2, EPCAM, MLH1, MRE11A, MSH2, MSH6, MUTYH, NBN, NF1, PALB2, PMS2, PTEN,  RAD50, RAD51C, RAD51D, SMARCA4, STK11, or TP53  (a) two variants of uncertain significance were found    (i) ATM, p.[id number]   (ii) SMARCA4, p.[id number]  (6) left IJ DVT 12/02/14, started on xarelto  (7) elevated blood glucose. A1c on 12/03/14 was 6.4.  PLAN: Jordan [name] looks and feels well today. The labs were reviewed in detail and her AST and ALT are mildly elevated since her first cycle of treatment. The bilirubin is stable. I consulted with Dr. Jana Hakim and he dropped the dose of her docetaxel to 6m/m2. She will proceed with cycle 2 as planned today.   I have refilled her lorazepam and written her for the 214mof xarelto she will now start daily. I have also written a courtesy refill on the mobic which has helped with her joint pain from the chemo, but she generally receives this from her PCP Dr. JeJacqulynn Cadet  MaLelan Ponsill return in 1 week for labs and a nadir visit. She understands and agrees with this plan. She knows the goal of treatment in her case is cure. She has been encouraged to call with any issues that might arise before her next visit here.   HeLaurie PandaNP   12/30/2014 2:05 PM

## 2014-12-30 NOTE — Telephone Encounter (Signed)
Appointments made per pof and patient will get a new schedule in chemo

## 2014-12-30 NOTE — Patient Instructions (Signed)
[facility] Discharge Instructions for Patients Receiving Chemotherapy  Today you received the following chemotherapy agents Taxotere and Cytoxan  To help prevent nausea and vomiting after your treatment, we encourage you to take your nausea medication Zofran and Compazine as directed   If you develop nausea and vomiting that is not controlled by your nausea medication, call the clinic.   BELOW ARE SYMPTOMS THAT SHOULD BE REPORTED IMMEDIATELY:  *FEVER GREATER THAN 100.5 F  *CHILLS WITH OR WITHOUT FEVER  NAUSEA AND VOMITING THAT IS NOT CONTROLLED WITH YOUR NAUSEA MEDICATION  *UNUSUAL SHORTNESS OF BREATH  *UNUSUAL BRUISING OR BLEEDING  TENDERNESS IN MOUTH AND THROAT WITH OR WITHOUT PRESENCE OF ULCERS  *URINARY PROBLEMS  *BOWEL PROBLEMS  UNUSUAL RASH Items with * indicate a potential emergency and should be followed up as soon as possible.  Feel free to call the clinic you have any questions or concerns. The clinic phone number is [phone].  Please show the [facility] at check-in to the Emergency Department and triage nurse.

## 2014-12-30 NOTE — Progress Notes (Signed)
No blood return from [facility], pt requested PIV for Chemotherapy today instead of waiting for TPA to work. Cathflo given at 1555,PIV started by RN per pt request.

## 2015-01-06 ENCOUNTER — Ambulatory Visit (HOSPITAL_BASED_OUTPATIENT_CLINIC_OR_DEPARTMENT_OTHER): Payer: Medicare PPO | Admitting: Nurse Practitioner

## 2015-01-06 ENCOUNTER — Other Ambulatory Visit: Payer: Self-pay | Admitting: *Deleted

## 2015-01-06 ENCOUNTER — Encounter: Payer: Self-pay | Admitting: Nurse Practitioner

## 2015-01-06 ENCOUNTER — Other Ambulatory Visit (HOSPITAL_BASED_OUTPATIENT_CLINIC_OR_DEPARTMENT_OTHER): Payer: Medicare PPO

## 2015-01-06 VITALS — BP 118/69 | HR 78 | Temp 98.5°F | Resp 18 | Ht 61.25 in | Wt 226.9 lb

## 2015-01-06 DIAGNOSIS — C50311 Malignant neoplasm of lower-inner quadrant of right female breast: Secondary | ICD-10-CM

## 2015-01-06 DIAGNOSIS — Z171 Estrogen receptor negative status [ER-]: Secondary | ICD-10-CM | POA: Diagnosis not present

## 2015-01-06 DIAGNOSIS — I82C12 Acute embolism and thrombosis of left internal jugular vein: Secondary | ICD-10-CM | POA: Diagnosis not present

## 2015-01-06 LAB — COMPREHENSIVE METABOLIC PANEL (CC13)
ALT: 32 U/L (ref 0–55)
AST: 23 U/L (ref 5–34)
Albumin: 3.1 g/dL — ABNORMAL LOW (ref 3.5–5.0)
Alkaline Phosphatase: 89 U/L (ref 40–150)
Anion Gap: 13 mEq/L — ABNORMAL HIGH (ref 3–11)
BUN: 14.1 mg/dL (ref 7.0–26.0)
CHLORIDE: 107 meq/L (ref 98–109)
CO2: 22 meq/L (ref 22–29)
Calcium: 9 mg/dL (ref 8.4–10.4)
Creatinine: 1.2 mg/dL — ABNORMAL HIGH (ref 0.6–1.1)
EGFR: 57 mL/min/{1.73_m2} — AB (ref >90)
Glucose: 112 mg/dl (ref 70–140)
POTASSIUM: 3.6 meq/L (ref 3.5–5.1)
Sodium: 142 mEq/L (ref 136–145)
Total Bilirubin: 0.8 mg/dL (ref 0.20–1.20)
Total Protein: 6.1 g/dL — ABNORMAL LOW (ref 6.4–8.3)

## 2015-01-06 LAB — CBC WITH DIFFERENTIAL/PLATELET
BASO%: 0.6 % (ref 0.0–2.0)
Basophils Absolute: 0.1 10*3/uL (ref 0.0–0.1)
EOS%: 2 % (ref 0.0–7.0)
Eosinophils Absolute: 0.3 10*3/uL (ref 0.0–0.5)
HCT: 40.5 % (ref 34.8–46.6)
HGB: 13.3 g/dL (ref 11.6–15.9)
LYMPH%: 12.9 % — ABNORMAL LOW (ref 14.0–49.7)
MCH: 28.4 pg (ref 25.1–34.0)
MCHC: 32.9 g/dL (ref 31.5–36.0)
MCV: 86.6 fL (ref 79.5–101.0)
MONO#: 0 10*3/uL — ABNORMAL LOW (ref 0.1–0.9)
MONO%: 0.3 % (ref 0.0–14.0)
NEUT%: 84.2 % — ABNORMAL HIGH (ref 38.4–76.8)
NEUTROS ABS: 13.7 10*3/uL — AB (ref 1.5–6.5)
PLATELETS: 177 10*3/uL (ref 145–400)
RBC: 4.68 10*6/uL (ref 3.70–5.45)
RDW: 15.5 % — ABNORMAL HIGH (ref 11.2–14.5)
WBC: 16.3 10*3/uL — ABNORMAL HIGH (ref 3.9–10.3)
lymph#: 2.1 10*3/uL (ref 0.9–3.3)

## 2015-01-06 MED ORDER — DEXAMETHASONE 4 MG PO TABS: 8.0000 mg | ORAL_TABLET | Freq: Two times a day (BID) | ORAL | Status: DC

## 2015-01-06 NOTE — Progress Notes (Signed)
[facility]  [phone] [phone]     ID: LYRA ALAIMO DOB: Aug 06, 1951  MR#: [id number]  XLK#:[id number]  Patient Care Team: Harrison Mons, PA-C as PCP - General (Physician Assistant) [name], MD as Consulting Physician (General Surgery) Chauncey Cruel, MD as Consulting Physician (Oncology) Arloa Koh, MD as Consulting Physician (Radiation Oncology) Mauro Kaufmann, RN as Registered Nurse Rockwell Germany, RN as Registered Nurse Holley Bouche, NP as Nurse Practitioner (Nurse Practitioner) PCP: [name] OTHER MD: [name] MD  CHIEF COMPLAINT: Early stage triple negative breast cancer  CURRENT TREATMENT: adjuvant chemotherapy   BREAST CANCER HISTORY: From the original intake note:  "Jordan [name]" had [facility] July [facility] 2015 showing breast density category B. A possible mass in the right breast was noted and on Jerry 20 03/05/2015 the patient underwent right diagnostic mammography with right ultrasonography. Spot compression views confirmed a slightly irregular mass in the lower inner quadrant of the right breast. This was not palpable by physical exam. Ultrasound showed a small hypoechoic mass measuring 5 mm in the area in question. Ultrasound of the right axilla was unremarkable.  Biopsy of the mass in question fibrin 05/06/2015 showed ([id number]) and invasive ductal carcinoma, grade 2, estrogen and progesterone receptor negative, with an MIB-1 of 41%, and no HER-2 amplification.  Bilateral breast MRI was obtained 10/04/2014. Results are pending.  The patient's subsequent history is as detailed below   INTERVAL HISTORY:  Jordan [name] returns today for follow up of her breast cancer. Today is day 66, cycle 2 of cyclophosphamide and docetaxel given every 3 weeks 4 with Neulasta support.  REVIEW OF SYSTEMS: Jordan [name] denies fevers, chills, nausea or vomiting. She has a few days of loose  stools after eating a salad that didn't agree with her. It resolved without imodium. She is eating and drinking well despite taste changes. She has some mild heartburn. Overall she sleeps well at night and has decent energy during the day. She denies mouth sores, rashes, or peripheral neuropathy symptoms. 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 A detailed review of systems is otherwise stable.  PAST MEDICAL HISTORY: Past Medical History  Diagnosis Date  . Hyperlipidemia     takes Zetia and Zocor daily  . Thyroid disease   . Nasal congestion   . Leg swelling   . Constipation   . Nausea   . Generalized headaches     due to allergies, sinus  . PONV (postoperative nausea and vomiting)     pt states she is very easy to sedate  . Hypertension     takes Maxzide and Metoprolol daily  . Pneumonia     walking about 6-66yr ago  . History of bronchitis     last time about 6-741yrago  . Hx of seasonal allergies     takes OTC allergy med nightly  . History of migraine     last one about 15+yrs ago  . Dizziness     r/t side effects from meds  . Vertigo     takes Meclizine prn  . Joint pain   . Back pain     buldging disc  . GERD (gastroesophageal reflux disease)     takes Protonix as needed  . Hemorrhoids   . History of colon polyps   . Mass of colon   . Cancer  colon  . History of UTI   . Hypothyroidism     takes Synthroid daily  . Depression     takes Wellbutrin daily  . Wears glasses     PAST SURGICAL HISTORY: Past Surgical History  Procedure Laterality Date  . Cesarean section  1968, 1970  . Knee surgery  1998    right - arthroscopic  . Abdominal hysterectomy    . Exploratory laparotomy    . Carpal tunnel release    . Colonoscopy    . Esophagogastroduodenoscopy    . Partial colectomy  03/30/2012    Procedure: PARTIAL COLECTOMY;  Surgeon: [name], MD;  Location: [facility];  Service: General;  Laterality: N/A;    . Appendectomy    . Radioactive seed guided mastectomy with axillary sentinel lymph node biopsy Right 10/17/2014    Procedure: RADIOACTIVE SEED GUIDED PARTIAL MASTECTOMY WITH AXILLARY SENTINEL LYMPH NODE BIOPSY;  Surgeon: [name], MD;  Location: [facility];  Service: General;  Laterality: Right;    FAMILY HISTORY Family History  Problem Relation Age of Onset  . Cancer Father 59    prostate  . Cancer Brother 67    colon  . Deep vein thrombosis Brother   . Cancer Maternal Aunt 43    breast  . Cancer Maternal Grandmother 87    breast  . Cancer Paternal Grandmother     colon  . Pulmonary embolism Brother     long-distance truck driver (PE x 2)  . Cancer Brother 31    colon  . Diabetes Maternal Grandfather   . Cancer Maternal Aunt 75    unknown type  . Mental illness Sister     history of Depression  . Seizures Sister 8    s/p traumatic brain injury  . Cancer Maternal Uncle     prostate   the patient's parents are still living, as of February 2016. Her father is 28 years old, with a history of prostate cancer diagnosed in his late 42s. The patient's mother is 79 years old. The patient had 5 brothers, 3 sisters. One brother died with prostate cancer at age 40. Another brother has a history of prostate cancer, age 69. One brother had colon cancer diagnosed age 67 as did a paternal grandmother. A maternal grandmother had breast cancer at age 40 as did a maternal aunt (diagnosed age 85.) A cousin was diagnosed with breast cancer the age of 88.  GYNECOLOGIC HISTORY:  No LMP recorded. Patient has had a hysterectomy. Menarche age 30, first live birth age 61. The patient is GX P1. She stopped having periods in 1981 when she underwent hysterectomy and unilateral salpingo-oophorectomy.. She did not take hormone replacement.  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 Her husband [name] owns a business that [employer] at night. The patient's biological son  [name] works in Biomedical scientist in [place]. He has 2 children. One of them, the patient's granddaughter [name], 22 years old, lives with the patient, and is a Interior and spatial designer at [employer]. The patient also has an adopted son, [name], lives in [place]. Jordan [name] attends a city of refugee church in [place] where she grew up    ADVANCED DIRECTIVES: Not in place  HEALTH MAINTENANCE: History  Substance Use Topics  . Smoking status: Never Smoker   . Smokeless tobacco: Never Used  . Alcohol Use: No     Colonoscopy: 03/30/2013/[facility]  PAP:  Bone density:  Lipid panel:  No Known  Allergies  Current Outpatient Prescriptions  Medication Sig Dispense Refill  . buPROPion (WELLBUTRIN XL) 150 MG 24 hr tablet Take 1 tablet (150 mg total) by mouth daily. 30 tablet 5  . ezetimibe (ZETIA) 10 MG tablet Take 1 tablet (10 mg total) by mouth daily. 30 tablet 5  . levothyroxine (SYNTHROID, LEVOTHROID) 125 MCG tablet TAKE 1 TABLET BY MOUTH DAILY 90 tablet 1  . LORazepam (ATIVAN) 0.5 MG tablet Take 1 tablet (0.5 mg total) by mouth at bedtime as needed (Nausea or vomiting). 30 tablet 0  . metoprolol succinate (TOPROL-XL) 25 MG 24 hr tablet Take 1 tablet (25 mg total) by mouth daily. 30 tablet 5  . ondansetron (ZOFRAN) 8 MG tablet Take 1 tablet (8 mg total) by mouth 2 (two) times daily. Start the day after chemo for 3 days. Then take as needed for nausea or vomiting. 30 tablet 1  . Prenatal Multivit-Min-Fe-FA (PRENATAL VITAMINS PO) Take by mouth.    . prochlorperazine (COMPAZINE) 10 MG tablet Take 1 tablet (10 mg total) by mouth every 6 (six) hours as needed (Nausea or vomiting). 30 tablet 1  . rivaroxaban (XARELTO) 20 MG TABS tablet Take 1 tablet (20 mg total) by mouth daily with supper. 30 tablet 3  . simvastatin (ZOCOR) 40 MG tablet Take 1 tablet (40 mg total) by mouth at bedtime. 30 tablet 5  . tobramycin-dexamethasone (TOBRADEX) ophthalmic solution Place 1 drop into both eyes 2 (two)  times daily. 5 mL 0  . triamterene-hydrochlorothiazide (MAXZIDE-25) 37.5-25 MG per tablet TAKE 1 TABLET DAILY (Patient taking differently: 0.5 tablets. TAKE 1 TABLET DAILY) 30 tablet 12  . dexamethasone (DECADRON) 4 MG tablet Take 2 tablets (8 mg total) by mouth 2 (two) times daily. Start the day before Taxotere. Then again the day after chemo for 3 days. 30 tablet 1  . lidocaine-prilocaine (EMLA) cream Apply to affected area once (Patient not taking: Reported on 01/06/2015) 30 g 3  . loratadine (CLARITIN) 10 MG tablet Take 1 tablet (10 mg total) by mouth daily. (Patient not taking: Reported on 12/30/2014) 30 tablet 4  . meclizine (ANTIVERT) 25 MG tablet Take 1 tablet (25 mg total) by mouth 4 (four) times daily as needed for dizziness. (Patient not taking: Reported on 12/02/2014) 30 tablet 1  . meloxicam (MOBIC) 15 MG tablet Take 1 tablet (15 mg total) by mouth daily. (Patient not taking: Reported on 01/06/2015) 30 tablet 0  . nitroGLYCERIN (NITROSTAT) 0.4 MG SL tablet Place 1 tablet (0.4 mg total) under the tongue every 5 (five) minutes as needed for chest pain. (Patient not taking: Reported on 12/09/2014) 30 tablet 1   No current facility-administered medications for this visit.   Facility-Administered Medications Ordered in Other Visits  Medication Dose Route Frequency Provider Last Rate Last Dose  . chlorhexidine (HIBICLENS) 4 % liquid 1 application  1 application Topical Once [name], MD      . chlorhexidine (HIBICLENS) 4 % liquid 1 application  1 application Topical Once [name], MD        OBJECTIVE: Middle-aged African-American woman who appears stated age 64 Vitals:   01/06/15 1506  BP: 118/69  Pulse: 78  Temp: 98.5 F (36.9 C)  Resp: 18     Body mass index is 42.51 kg/(m^2).    ECOG FS:1 - Symptomatic but completely ambulatory  Sclerae unicteric, pupils round and equal Oropharynx clear and moist-- no thrush or other lesions No cervical or supraclavicular  adenopathy Lungs no rales or rhonchi  Heart regular rate and rhythm Abd soft, nontender, positive bowel sounds MSK no focal spinal tenderness, no upper extremity lymphedema Neuro: nonfocal, well oriented, appropriate affect Breasts: deferred  LAB RESULTS:  CMP     Component Value Date/Time   NA 142 01/06/2015 1448   NA 144 12/03/2014 0340   K 3.6 01/06/2015 1448   K 4.0 12/03/2014 0340   CL 108 12/03/2014 0340   CO2 22 01/06/2015 1448   CO2 24 12/03/2014 0340   GLUCOSE 112 01/06/2015 1448   GLUCOSE 105* 12/03/2014 0340   BUN 14.1 01/06/2015 1448   BUN 13 12/03/2014 0340   CREATININE 1.2* 01/06/2015 1448   CREATININE 0.98 12/03/2014 0340   CREATININE 1.04 08/20/2014 1238   CALCIUM 9.0 01/06/2015 1448   CALCIUM 8.9 12/03/2014 0340   PROT 6.1* 01/06/2015 1448   PROT 6.9 08/20/2014 1238   ALBUMIN 3.1* 01/06/2015 1448   ALBUMIN 3.9 08/20/2014 1238   AST 23 01/06/2015 1448   AST 23 08/20/2014 1238   ALT 32 01/06/2015 1448   ALT 24 08/20/2014 1238   ALKPHOS 89 01/06/2015 1448   ALKPHOS 54 08/20/2014 1238   BILITOT 0.80 01/06/2015 1448   BILITOT 0.6 08/20/2014 1238   GFRNONAA 59* 12/03/2014 0340   GFRAA 68* 12/03/2014 0340    INo results found for: SPEP, UPEP  Lab Results  Component Value Date   WBC 16.3* 01/06/2015   NEUTROABS 13.7* 01/06/2015   HGB 13.3 01/06/2015   HCT 40.5 01/06/2015   MCV 86.6 01/06/2015   PLT 177 01/06/2015      Chemistry      Component Value Date/Time   NA 142 01/06/2015 1448   NA 144 12/03/2014 0340   K 3.6 01/06/2015 1448   K 4.0 12/03/2014 0340   CL 108 12/03/2014 0340   CO2 22 01/06/2015 1448   CO2 24 12/03/2014 0340   BUN 14.1 01/06/2015 1448   BUN 13 12/03/2014 0340   CREATININE 1.2* 01/06/2015 1448   CREATININE 0.98 12/03/2014 0340   CREATININE 1.04 08/20/2014 1238      Component Value Date/Time   CALCIUM 9.0 01/06/2015 1448   CALCIUM 8.9 12/03/2014 0340   ALKPHOS 89 01/06/2015 1448   ALKPHOS 54 08/20/2014 1238   AST  23 01/06/2015 1448   AST 23 08/20/2014 1238   ALT 32 01/06/2015 1448   ALT 24 08/20/2014 1238   BILITOT 0.80 01/06/2015 1448   BILITOT 0.6 08/20/2014 1238       No results found for: LABCA2  No components found for: LABCA125  No results for input(s): INR in the last 168 hours.  Urinalysis    Component Value Date/Time   COLORURINE YELLOW 03/09/2011 0857   APPEARANCEUR CLOUDY* 03/09/2011 0857   LABSPEC 1.022 03/09/2011 0857   PHURINE 5.5 03/09/2011 0857   GLUCOSEU NEGATIVE 03/09/2011 0857   HGBUR NEGATIVE 03/09/2011 0857   BILIRUBINUR SMALL* 03/09/2011 0857   KETONESUR NEGATIVE 03/09/2011 0857   PROTEINUR NEGATIVE 03/09/2011 0857   UROBILINOGEN 0.2 03/09/2011 0857   NITRITE NEGATIVE 03/09/2011 0857   LEUKOCYTESUR LARGE* 03/09/2011 0857    STUDIES: No results found.  ASSESSMENT: 64 y.o.  [place] woman status post right breast lower inner quadrant biopsy 09/24/2014 for a clinical T1a N0, stage IA invasive ductal carcinoma, grade 1 or 2, estrogen and progesterone receptor negative, with an MIB-1 of 41%, and HER-2 negative   (1) status post right lumpectomy and axillary sentinel lymph node sampling 10/17/2014 for a pT1b pN0, stage IA  invasive ductal carcinoma, grade 3, repeat HER-2 again negative.   (2) adjuvant chemotherapy to consist of cyclophosphamide and docetaxel given every 3 weeks 4 with Neulasta support   (3) adjuvant radiation to follow chemotherapy   (4) status post partial right colectomy with lymphadenectomy 03/30/2012 for a 2.7 cm tubular adenoma with high-grade dysplasia, no evidence of invasion, 0 of 11 lymph nodes involved ([id number])  (5) genetics testing sent 10/04/2014 through the [facility] gene panel /[facility] found no deleterious mutations in ATM, BARD1, BRCA1, BRCA2, BRIP1, CDH1, CHEK2, EPCAM, MLH1, MRE11A, MSH2, MSH6, MUTYH, NBN, NF1, PALB2, PMS2, PTEN, RAD50, RAD51C, RAD51D, SMARCA4, STK11, or TP53  (a) two variants of uncertain  significance were found    (i) ATM, p.[id number]   (ii) SMARCA4, p.[id number]  (6) left IJ DVT 12/02/14, started on xarelto  (7) elevated blood glucose. A1c on 12/03/14 was 6.4.  PLAN: Jordan [name] is doing well this week. The labs were reviewed in detail and were entirely stable. She is getting the hang of chemotherapy, and no adjustments will need to be made to her regimen or antiemetic advice.  Jordan [name] will return in 2 weeks for the start of cycle 3 of cyclophosphamide and docetaxel. She understands and agrees with this plan. She knows the goal of treatment in her case is cure. She has been encouraged to call with any issues that might arise before her next visit here.   Laurie Panda, NP   01/06/2015 3:59 PM

## 2015-01-14 ENCOUNTER — Ambulatory Visit: Payer: Medicare PPO

## 2015-01-14 ENCOUNTER — Other Ambulatory Visit: Payer: Medicare PPO

## 2015-01-20 ENCOUNTER — Encounter: Payer: Self-pay | Admitting: Nurse Practitioner

## 2015-01-20 ENCOUNTER — Ambulatory Visit (HOSPITAL_BASED_OUTPATIENT_CLINIC_OR_DEPARTMENT_OTHER): Payer: Medicare PPO

## 2015-01-20 ENCOUNTER — Other Ambulatory Visit: Payer: Medicare PPO

## 2015-01-20 ENCOUNTER — Ambulatory Visit: Payer: Medicare PPO | Admitting: Nurse Practitioner

## 2015-01-20 ENCOUNTER — Other Ambulatory Visit: Payer: Self-pay | Admitting: Oncology

## 2015-01-20 ENCOUNTER — Telehealth: Payer: Self-pay | Admitting: Nurse Practitioner

## 2015-01-20 ENCOUNTER — Ambulatory Visit (HOSPITAL_BASED_OUTPATIENT_CLINIC_OR_DEPARTMENT_OTHER): Payer: Medicare PPO | Admitting: *Deleted

## 2015-01-20 ENCOUNTER — Ambulatory Visit (HOSPITAL_BASED_OUTPATIENT_CLINIC_OR_DEPARTMENT_OTHER): Payer: Medicare PPO | Admitting: Nurse Practitioner

## 2015-01-20 VITALS — BP 116/69 | HR 94 | Temp 98.2°F | Resp 18 | Ht 61.25 in | Wt 228.4 lb

## 2015-01-20 DIAGNOSIS — Z171 Estrogen receptor negative status [ER-]: Secondary | ICD-10-CM | POA: Diagnosis not present

## 2015-01-20 DIAGNOSIS — I82C12 Acute embolism and thrombosis of left internal jugular vein: Secondary | ICD-10-CM

## 2015-01-20 DIAGNOSIS — C50311 Malignant neoplasm of lower-inner quadrant of right female breast: Secondary | ICD-10-CM

## 2015-01-20 DIAGNOSIS — Z5111 Encounter for antineoplastic chemotherapy: Secondary | ICD-10-CM | POA: Diagnosis not present

## 2015-01-20 DIAGNOSIS — Z452 Encounter for adjustment and management of vascular access device: Secondary | ICD-10-CM

## 2015-01-20 DIAGNOSIS — R7309 Other abnormal glucose: Secondary | ICD-10-CM

## 2015-01-20 DIAGNOSIS — Z5189 Encounter for other specified aftercare: Secondary | ICD-10-CM | POA: Diagnosis not present

## 2015-01-20 LAB — CBC WITH DIFFERENTIAL/PLATELET
BASO%: 0.1 % (ref 0.0–2.0)
Basophils Absolute: 0 10*3/uL (ref 0.0–0.1)
EOS%: 0 % (ref 0.0–7.0)
Eosinophils Absolute: 0 10*3/uL (ref 0.0–0.5)
HCT: 40.7 % (ref 34.8–46.6)
HEMOGLOBIN: 13.3 g/dL (ref 11.6–15.9)
LYMPH%: 6.1 % — AB (ref 14.0–49.7)
MCH: 28.7 pg (ref 25.1–34.0)
MCHC: 32.7 g/dL (ref 31.5–36.0)
MCV: 87.9 fL (ref 79.5–101.0)
MONO#: 0.8 10*3/uL (ref 0.1–0.9)
MONO%: 3 % (ref 0.0–14.0)
NEUT#: 24.5 10*3/uL — ABNORMAL HIGH (ref 1.5–6.5)
NEUT%: 90.8 % — AB (ref 38.4–76.8)
Platelets: 228 10*3/uL (ref 145–400)
RBC: 4.63 10*6/uL (ref 3.70–5.45)
RDW: 17.6 % — AB (ref 11.2–14.5)
WBC: 27 10*3/uL — ABNORMAL HIGH (ref 3.9–10.3)
lymph#: 1.7 10*3/uL (ref 0.9–3.3)

## 2015-01-20 LAB — COMPREHENSIVE METABOLIC PANEL (CC13)
ALBUMIN: 3.3 g/dL — AB (ref 3.5–5.0)
ALT: 42 U/L (ref 0–55)
AST: 26 U/L (ref 5–34)
Alkaline Phosphatase: 78 U/L (ref 40–150)
Anion Gap: 12 mEq/L — ABNORMAL HIGH (ref 3–11)
BUN: 16.4 mg/dL (ref 7.0–26.0)
CO2: 23 mEq/L (ref 22–29)
CREATININE: 1 mg/dL (ref 0.6–1.1)
Calcium: 8.6 mg/dL (ref 8.4–10.4)
Chloride: 110 mEq/L — ABNORMAL HIGH (ref 98–109)
EGFR: 67 mL/min/{1.73_m2} — ABNORMAL LOW (ref >90)
GLUCOSE: 135 mg/dL (ref 70–140)
Potassium: 3.5 mEq/L (ref 3.5–5.1)
Sodium: 144 mEq/L (ref 136–145)
Total Bilirubin: 0.56 mg/dL (ref 0.20–1.20)
Total Protein: 6.5 g/dL (ref 6.4–8.3)

## 2015-01-20 MED ORDER — SODIUM CHLORIDE 0.9 % IV SOLN
Freq: Once | INTRAVENOUS | Status: AC
  Administered 2015-01-20: 16:00:00 via INTRAVENOUS
  Filled 2015-01-20: qty 8

## 2015-01-20 MED ORDER — SODIUM CHLORIDE 0.9 % IV SOLN
INTRAVENOUS | Status: DC
  Administered 2015-01-20: 16:00:00 via INTRAVENOUS

## 2015-01-20 MED ORDER — HEPARIN SOD (PORK) LOCK FLUSH 100 UNIT/ML IV SOLN
500.0000 [IU] | Freq: Once | INTRAVENOUS | Status: AC | PRN
  Administered 2015-01-20: 500 [IU]
  Filled 2015-01-20: qty 5

## 2015-01-20 MED ORDER — SODIUM CHLORIDE 0.9 % IV SOLN
600.0000 mg/m2 | Freq: Once | INTRAVENOUS | Status: AC
  Administered 2015-01-20: 1280 mg via INTRAVENOUS
  Filled 2015-01-20: qty 64

## 2015-01-20 MED ORDER — SODIUM CHLORIDE 0.9 % IJ SOLN
10.0000 mL | INTRAMUSCULAR | Status: DC | PRN
  Administered 2015-01-20: 10 mL
  Filled 2015-01-20: qty 10

## 2015-01-20 MED ORDER — ALTEPLASE 2 MG IJ SOLR
2.0000 mg | Freq: Once | INTRAMUSCULAR | Status: AC | PRN
  Administered 2015-01-20: 2 mg
  Filled 2015-01-20: qty 2

## 2015-01-20 MED ORDER — DOCETAXEL CHEMO INJECTION 160 MG/16ML
50.0000 mg/m2 | Freq: Once | INTRAVENOUS | Status: AC
  Administered 2015-01-20: 110 mg via INTRAVENOUS
  Filled 2015-01-20: qty 11

## 2015-01-20 MED ORDER — PEGFILGRASTIM 6 MG/0.6ML ~~LOC~~ PSKT
6.0000 mg | PREFILLED_SYRINGE | Freq: Once | SUBCUTANEOUS | Status: AC
  Administered 2015-01-20: 6 mg via SUBCUTANEOUS
  Filled 2015-01-20: qty 0.6

## 2015-01-20 NOTE — Progress Notes (Signed)
1515: Pt's pac accessed, unable to get blood return. Thane Edu, RN assessed pac as well.  PAC flushes easily with NS, pt denies pain, unable to get blood return.    1530: Cathflo instilled in PAC.  Patient agreeable to having PIV placed to begin treatment.  1600: PAC checked, unable to get blood return at this time. Pt reports pain to PAC site.  Val, Dr. Virgie Dad RN, notified and will set up a dye study for patient before her next treatment. Pt verbalized understanding.  Will continue to reassess PAC during treatment.

## 2015-01-20 NOTE — Telephone Encounter (Signed)
per Nira Conn to move pt appt-cld & spoke to pt to adv of appt-pt understood

## 2015-01-20 NOTE — Progress Notes (Signed)
1740. Blood return noted with only 2.32ml withdrawal. Deaccessed patient with flush/heparin flush.

## 2015-01-20 NOTE — Patient Instructions (Addendum)
[facility] Discharge Instructions for Patients Receiving Chemotherapy  Today you received the following chemotherapy agents Taxotere, Cytoxan.  To help prevent nausea and vomiting after your treatment, we encourage you to take your nausea medication as directed.    If you develop nausea and vomiting that is not controlled by your nausea medication, call the clinic.   BELOW ARE SYMPTOMS THAT SHOULD BE REPORTED IMMEDIATELY:  *FEVER GREATER THAN 100.5 F  *CHILLS WITH OR WITHOUT FEVER  NAUSEA AND VOMITING THAT IS NOT CONTROLLED WITH YOUR NAUSEA MEDICATION  *UNUSUAL SHORTNESS OF BREATH  *UNUSUAL BRUISING OR BLEEDING  TENDERNESS IN MOUTH AND THROAT WITH OR WITHOUT PRESENCE OF ULCERS  *URINARY PROBLEMS  *BOWEL PROBLEMS  UNUSUAL RASH Items with * indicate a potential emergency and should be followed up as soon as possible.  Feel free to call the clinic you have any questions or concerns. The clinic phone number is [phone].  Please show the [facility] at check-in to the Emergency Department and triage nurse.  Pegfilgrastim injection What is this medicine? PEGFILGRASTIM (peg fil GRA stim) is a long-acting granulocyte colony-stimulating factor that stimulates the growth of neutrophils, a type of white blood cell important in the body's fight against infection. It is used to reduce the incidence of fever and infection in patients with certain types of cancer who are receiving chemotherapy that affects the bone marrow. This medicine may be used for other purposes; ask your health care provider or pharmacist if you have questions. COMMON BRAND NAME(S): Neulasta What should I tell my health care provider before I take this medicine? They need to know if you have any of these conditions: -latex allergy -ongoing radiation therapy -sickle cell disease -skin reactions to acrylic adhesives (On-Body Injector only) -an unusual or allergic reaction to pegfilgrastim,  filgrastim, other medicines, foods, dyes, or preservatives -pregnant or trying to get pregnant -breast-feeding How should I use this medicine? This medicine is for injection under the skin. If you get this medicine at home, you will be taught how to prepare and give the pre-filled syringe or how to use the On-body Injector. Refer to the patient Instructions for Use for detailed instructions. Use exactly as directed. Take your medicine at regular intervals. Do not take your medicine more often than directed. It is important that you put your used needles and syringes in a special sharps container. Do not put them in a trash can. If you do not have a sharps container, call your pharmacist or healthcare provider to get one. Talk to your pediatrician regarding the use of this medicine in children. Special care may be needed. Overdosage: If you think you have taken too much of this medicine contact a poison control center or emergency room at once. NOTE: This medicine is only for you. Do not share this medicine with others. What if I miss a dose? It is important not to miss your dose. Call your doctor or health care professional if you miss your dose. If you miss a dose due to an On-body Injector failure or leakage, a new dose should be administered as soon as possible using a single prefilled syringe for manual use. What may interact with this medicine? Interactions have not been studied. Give your health care provider a list of all the medicines, herbs, non-prescription drugs, or dietary supplements you use. Also tell them if you smoke, drink alcohol, or use illegal drugs. Some items may interact with your medicine. This list may not describe  all possible interactions. Give your health care provider a list of all the medicines, herbs, non-prescription drugs, or dietary supplements you use. Also tell them if you smoke, drink alcohol, or use illegal drugs. Some items may interact with your medicine. What  should I watch for while using this medicine? You may need blood work done while you are taking this medicine. If you are going to need a MRI, CT scan, or other procedure, tell your doctor that you are using this medicine (On-Body Injector only). What side effects may I notice from receiving this medicine? Side effects that you should report to your doctor or health care professional as soon as possible: -allergic reactions like skin rash, itching or hives, swelling of the face, lips, or tongue -dizziness -fever -pain, redness, or irritation at site where injected -pinpoint red spots on the skin -shortness of breath or breathing problems -stomach or side pain, or pain at the shoulder -swelling -tiredness -trouble passing urine Side effects that usually do not require medical attention (report to your doctor or health care professional if they continue or are bothersome): -bone pain -muscle pain This list may not describe all possible side effects. Call your doctor for medical advice about side effects. You may report side effects to FDA at 1-800-FDA-1088. Where should I keep my medicine? Keep out of the reach of children. Store pre-filled syringes in a refrigerator between 2 and 8 degrees C (36 and 46 degrees F). Do not freeze. Keep in carton to protect from light. Throw away this medicine if it is left out of the refrigerator for more than 48 hours. Throw away any unused medicine after the expiration date. NOTE: This sheet is a summary. It may not cover all possible information. If you have questions about this medicine, talk to your doctor, pharmacist, or health care provider.  2015, Elsevier/Gold Standard. (2013-11-01 16:14:05)

## 2015-01-20 NOTE — Patient Instructions (Signed)
[facility] Discharge Instructions for Patients Receiving Chemotherapy  Today you received the following chemotherapy agents;  Taxotere and Cytoxan.    To help prevent nausea and vomiting after your treatment, we encourage you to take your nausea medication as directed.     If you develop nausea and vomiting that is not controlled by your nausea medication, call the clinic.   BELOW ARE SYMPTOMS THAT SHOULD BE REPORTED IMMEDIATELY:  *FEVER GREATER THAN 100.5 F  *CHILLS WITH OR WITHOUT FEVER  NAUSEA AND VOMITING THAT IS NOT CONTROLLED WITH YOUR NAUSEA MEDICATION  *UNUSUAL SHORTNESS OF BREATH  *UNUSUAL BRUISING OR BLEEDING  TENDERNESS IN MOUTH AND THROAT WITH OR WITHOUT PRESENCE OF ULCERS  *URINARY PROBLEMS  *BOWEL PROBLEMS  UNUSUAL RASH Items with * indicate a potential emergency and should be followed up as soon as possible.  Feel free to call the clinic you have any questions or concerns. The clinic phone number is [phone].  Please show the CHEMO ALERT CARD at check-in to the Emergency Department and triage nurse.   

## 2015-01-20 NOTE — Telephone Encounter (Signed)
Referral noted for rad/onc and they will call the patient with an appointment

## 2015-01-20 NOTE — Progress Notes (Signed)
Jordan [name]  [phone]) [phone] [phone]) [phone]     ID: Jordan [name] DOB: 03-23-1951  MR#: [id number]  GLO#:[id number]  Patient Care Team: Harrison Mons, PA-C as PCP - General (Physician Assistant) [name], MD as Consulting Physician (General Surgery) Chauncey Cruel, MD as Consulting Physician (Oncology) Arloa Koh, MD as Consulting Physician (Radiation Oncology) Mauro Kaufmann, RN as Registered Nurse Rockwell Germany, RN as Registered Nurse Holley Bouche, NP as Nurse Practitioner (Nurse Practitioner) PCP: [name] OTHER MD: [name] MD  CHIEF COMPLAINT: Early stage triple negative breast cancer  CURRENT TREATMENT: adjuvant chemotherapy   BREAST CANCER HISTORY: From the original intake note:  "Jordan [name]" [facility] showing breast density category B. A possible mass in the right breast was noted and on Jerry 20 03/05/2015 the patient underwent right diagnostic mammography with right ultrasonography. Spot compression views confirmed a slightly irregular mass in the lower inner quadrant of the right breast. This was not palpable by physical exam. Ultrasound showed a small hypoechoic mass measuring 5 mm in the area in question. Ultrasound of the right axilla was unremarkable.  Biopsy of the mass in question fibrin 05/06/2015 showed ([id number]) and invasive ductal carcinoma, grade 2, estrogen and progesterone receptor negative, with an MIB-1 of 41%, and no HER-2 amplification.  Bilateral breast MRI was obtained 10/04/2014. Results are pending.  The patient's subsequent history is as detailed below   INTERVAL HISTORY:  Jordan [name] returns today for follow up of her breast cancer. Today is day 1, cycle 3 of cyclophosphamide and docetaxel given every 3 weeks 4 with Neulasta support.  REVIEW OF SYSTEMS: Jordan [name] is feeling well today. She denies fevers, chills, nausea, vomiting, or  changes in bowel or bladder habits. Her appetite is only fair secondary to taste changes. She helped set up and decorate a birthday party this past weekend and performed a repetitive motion with her fingers while tying up gift bags. As a consequence her fingertips were achy yesterday and Saturday, but are much improved today. She is worn out energy wise, but sleeps well. A detailed review of systems is otherwise stable.  PAST MEDICAL HISTORY: Past Medical History  Diagnosis Date  . Hyperlipidemia     takes Zetia and Zocor daily  . Thyroid disease   . Nasal congestion   . Leg swelling   . Constipation   . Nausea   . Generalized headaches     due to allergies, sinus  . PONV (postoperative nausea and vomiting)     pt states she is very easy to sedate  . Hypertension     takes Maxzide and Metoprolol daily  . Pneumonia     walking about 6-40yr ago  . History of bronchitis     last time about 6-762yrago  . Hx of seasonal allergies     takes OTC allergy med nightly  . History of migraine     last one about 15+yrs ago  . Dizziness     r/t side effects from meds  . Vertigo     takes Meclizine prn  . Joint pain   . Back pain     buldging disc  . GERD (gastroesophageal reflux disease)     takes Protonix as needed  . Hemorrhoids   . History of colon polyps   . Mass of colon   . Cancer     colon  . History of UTI   .  Hypothyroidism     takes Synthroid daily  . Depression     takes Wellbutrin daily  . Wears glasses     PAST SURGICAL HISTORY: Past Surgical History  Procedure Laterality Date  . Cesarean section  1968, 1970  . Knee surgery  1998    right - arthroscopic  . Abdominal hysterectomy    . Exploratory laparotomy    . Carpal tunnel release    . Colonoscopy    . Esophagogastroduodenoscopy    . Partial colectomy  03/30/2012    Procedure: PARTIAL COLECTOMY;  Surgeon: [name], MD;  Location: [facility];  Service: General;  Laterality: N/A;  . Appendectomy    .  Radioactive seed guided mastectomy with axillary sentinel lymph node biopsy Right 10/17/2014    Procedure: RADIOACTIVE SEED GUIDED PARTIAL MASTECTOMY WITH AXILLARY SENTINEL LYMPH NODE BIOPSY;  Surgeon: [name], MD;  Location: [facility];  Service: General;  Laterality: Right;    FAMILY HISTORY Family History  Problem Relation Age of Onset  . Cancer Father 36    prostate  . Cancer Brother 19    colon  . Deep vein thrombosis Brother   . Cancer Maternal Aunt 52    breast  . Cancer Maternal Grandmother 66    breast  . Cancer Paternal Grandmother     colon  . Pulmonary embolism Brother     long-distance truck driver (PE x 2)  . Cancer Brother 42    colon  . Diabetes Maternal Grandfather   . Cancer Maternal Aunt 75    unknown type  . Mental illness Sister     history of Depression  . Seizures Sister 8    s/p traumatic brain injury  . Cancer Maternal Uncle     prostate   the patient's parents are still living, as of February 2016. Her father is 60 years old, with a history of prostate cancer diagnosed in his late 43s. The patient's mother is 43 years old. The patient had 5 brothers, 3 sisters. One brother died with prostate cancer at age 52. Another brother has a history of prostate cancer, age 62. One brother had colon cancer diagnosed age 62 as did a paternal grandmother. A maternal grandmother had breast cancer at age 20 as did a maternal aunt (diagnosed age 30.) A cousin was diagnosed with breast cancer the age of 23.  GYNECOLOGIC HISTORY:  No LMP recorded. Patient has had a hysterectomy. Menarche age 20, first live birth age 71. The patient is GX P1. She stopped having periods in 1981 when she underwent hysterectomy and unilateral salpingo-oophorectomy.. She did not take hormone replacement.  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 Her husband Jordan [name] owns a business that [employer] at night. The patient's biological son Jordan [name] works in  Biomedical scientist in [place]. He has 2 children. One of them, the patient's granddaughter Jordan [name], 9 years old, lives with the patient, and is a Interior and spatial designer at [employer]. The patient also has an adopted son, Jordan [name], lives in [place]. Jordan [name] attends a city of [employer] church in [place] where she grew up    ADVANCED DIRECTIVES: Not in place  HEALTH MAINTENANCE: History  Substance Use Topics  . Smoking status: Never Smoker   . Smokeless tobacco: Never Used  . Alcohol Use: No     Colonoscopy: 03/30/2013/[facility]  PAP:  Bone density:  Lipid panel:  No Known Allergies  Current Outpatient Prescriptions  Medication Sig Dispense Refill  .  buPROPion (WELLBUTRIN XL) 150 MG 24 hr tablet Take 1 tablet (150 mg total) by mouth daily. 30 tablet 5  . dexamethasone (DECADRON) 4 MG tablet Take 2 tablets (8 mg total) by mouth 2 (two) times daily. Start the day before Taxotere. Then again the day after chemo for 3 days. 30 tablet 1  . ezetimibe (ZETIA) 10 MG tablet Take 1 tablet (10 mg total) by mouth daily. 30 tablet 5  . levothyroxine (SYNTHROID, LEVOTHROID) 125 MCG tablet TAKE 1 TABLET BY MOUTH DAILY 90 tablet 1  . lidocaine-prilocaine (EMLA) cream Apply to affected area once 30 g 3  . LORazepam (ATIVAN) 0.5 MG tablet Take 1 tablet (0.5 mg total) by mouth at bedtime as needed (Nausea or vomiting). 30 tablet 0  . meloxicam (MOBIC) 15 MG tablet Take 1 tablet (15 mg total) by mouth daily. 30 tablet 0  . metoprolol succinate (TOPROL-XL) 25 MG 24 hr tablet Take 1 tablet (25 mg total) by mouth daily. 30 tablet 5  . ondansetron (ZOFRAN) 8 MG tablet Take 1 tablet (8 mg total) by mouth 2 (two) times daily. Start the day after chemo for 3 days. Then take as needed for nausea or vomiting. 30 tablet 1  . Prenatal Multivit-Min-Fe-FA (PRENATAL VITAMINS PO) Take by mouth.    . rivaroxaban (XARELTO) 20 MG TABS tablet Take 1 tablet (20 mg total) by mouth daily with supper. 30 tablet 3  .  simvastatin (ZOCOR) 40 MG tablet Take 1 tablet (40 mg total) by mouth at bedtime. 30 tablet 5  . tobramycin-dexamethasone (TOBRADEX) ophthalmic solution Place 1 drop into both eyes 2 (two) times daily. 5 mL 0  . triamterene-hydrochlorothiazide (MAXZIDE-25) 37.5-25 MG per tablet TAKE 1 TABLET DAILY (Patient taking differently: 0.5 tablets. TAKE 1 TABLET DAILY) 30 tablet 12  . loratadine (CLARITIN) 10 MG tablet Take 1 tablet (10 mg total) by mouth daily. (Patient not taking: Reported on 12/30/2014) 30 tablet 4  . meclizine (ANTIVERT) 25 MG tablet Take 1 tablet (25 mg total) by mouth 4 (four) times daily as needed for dizziness. (Patient not taking: Reported on 12/02/2014) 30 tablet 1  . nitroGLYCERIN (NITROSTAT) 0.4 MG SL tablet Place 1 tablet (0.4 mg total) under the tongue every 5 (five) minutes as needed for chest pain. (Patient not taking: Reported on 12/09/2014) 30 tablet 1  . prochlorperazine (COMPAZINE) 10 MG tablet Take 1 tablet (10 mg total) by mouth every 6 (six) hours as needed (Nausea or vomiting). (Patient not taking: Reported on 01/20/2015) 30 tablet 1   No current facility-administered medications for this visit.   Facility-Administered Medications Ordered in Other Visits  Medication Dose Route Frequency Provider Last Rate Last Dose  . 0.9 %  sodium chloride infusion   Intravenous Continuous Chauncey Cruel, MD 20 mL/hr at 01/20/15 1550    . chlorhexidine (HIBICLENS) 4 % liquid 1 application  1 application Topical Once [name], MD      . chlorhexidine (HIBICLENS) 4 % liquid 1 application  1 application Topical Once [name], MD      . cyclophosphamide (CYTOXAN) 1,280 mg in sodium chloride 0.9 % 250 mL chemo infusion  600 mg/m2 (Treatment Plan Actual) Intravenous Once Chauncey Cruel, MD      . DOCEtaxel (TAXOTERE) 110 mg in dextrose 5 % 250 mL chemo infusion  50 mg/m2 (Treatment Plan Actual) Intravenous Once Chauncey Cruel, MD      . heparin lock flush 100 unit/mL  500  Units Intracatheter Once  PRN Chauncey Cruel, MD      . pegfilgrastim (NEULASTA ONPRO KIT) injection 6 mg  6 mg Subcutaneous Once Chauncey Cruel, MD      . sodium chloride 0.9 % injection 10 mL  10 mL Intracatheter PRN Chauncey Cruel, MD        OBJECTIVE: Middle-aged African-American woman who appears stated age 16 Vitals:   01/20/15 1433  BP: 116/69  Pulse: 94  Temp: 98.2 F (36.8 C)  Resp: 18     Body mass index is 42.79 kg/(m^2).    ECOG FS:1 - Symptomatic but completely ambulatory  Skin: excessively dry hands, hyper pigmented nail beds HEENT: sclerae anicteric, conjunctivae pink, oropharynx clear. No thrush or mucositis.  Lymph Nodes: No cervical or supraclavicular lymphadenopathy  Lungs: clear to auscultation bilaterally, no rales, wheezes, or rhonci  Heart: regular rate and rhythm  Abdomen: round, soft, non tender, positive bowel sounds  Musculoskeletal: No focal spinal tenderness, no peripheral edema  Neuro: non focal, well oriented, positive affect  Breasts: deferred  LAB RESULTS:  CMP     Component Value Date/Time   NA 144 01/20/2015 1422   NA 144 12/03/2014 0340   K 3.5 01/20/2015 1422   K 4.0 12/03/2014 0340   CL 108 12/03/2014 0340   CO2 23 01/20/2015 1422   CO2 24 12/03/2014 0340   GLUCOSE 135 01/20/2015 1422   GLUCOSE 105* 12/03/2014 0340   BUN 16.4 01/20/2015 1422   BUN 13 12/03/2014 0340   CREATININE 1.0 01/20/2015 1422   CREATININE 0.98 12/03/2014 0340   CREATININE 1.04 08/20/2014 1238   CALCIUM 8.6 01/20/2015 1422   CALCIUM 8.9 12/03/2014 0340   PROT 6.5 01/20/2015 1422   PROT 6.9 08/20/2014 1238   ALBUMIN 3.3* 01/20/2015 1422   ALBUMIN 3.9 08/20/2014 1238   AST 26 01/20/2015 1422   AST 23 08/20/2014 1238   ALT 42 01/20/2015 1422   ALT 24 08/20/2014 1238   ALKPHOS 78 01/20/2015 1422   ALKPHOS 54 08/20/2014 1238   BILITOT 0.56 01/20/2015 1422   BILITOT 0.6 08/20/2014 1238   GFRNONAA 59* 12/03/2014 0340   GFRAA 68* 12/03/2014 0340      INo results found for: SPEP, UPEP  Lab Results  Component Value Date   WBC 27.0* 01/20/2015   NEUTROABS 24.5* 01/20/2015   HGB 13.3 01/20/2015   HCT 40.7 01/20/2015   MCV 87.9 01/20/2015   PLT 228 01/20/2015      Chemistry      Component Value Date/Time   NA 144 01/20/2015 1422   NA 144 12/03/2014 0340   K 3.5 01/20/2015 1422   K 4.0 12/03/2014 0340   CL 108 12/03/2014 0340   CO2 23 01/20/2015 1422   CO2 24 12/03/2014 0340   BUN 16.4 01/20/2015 1422   BUN 13 12/03/2014 0340   CREATININE 1.0 01/20/2015 1422   CREATININE 0.98 12/03/2014 0340   CREATININE 1.04 08/20/2014 1238      Component Value Date/Time   CALCIUM 8.6 01/20/2015 1422   CALCIUM 8.9 12/03/2014 0340   ALKPHOS 78 01/20/2015 1422   ALKPHOS 54 08/20/2014 1238   AST 26 01/20/2015 1422   AST 23 08/20/2014 1238   ALT 42 01/20/2015 1422   ALT 24 08/20/2014 1238   BILITOT 0.56 01/20/2015 1422   BILITOT 0.6 08/20/2014 1238       No results found for: LABCA2  No components found for: LABCA125  No results for input(s): INR in the last 168  hours.  Urinalysis    Component Value Date/Time   COLORURINE YELLOW 03/09/2011 0857   APPEARANCEUR CLOUDY* 03/09/2011 0857   LABSPEC 1.022 03/09/2011 0857   PHURINE 5.5 03/09/2011 0857   GLUCOSEU NEGATIVE 03/09/2011 0857   HGBUR NEGATIVE 03/09/2011 0857   BILIRUBINUR SMALL* 03/09/2011 0857   KETONESUR NEGATIVE 03/09/2011 0857   PROTEINUR NEGATIVE 03/09/2011 0857   UROBILINOGEN 0.2 03/09/2011 0857   NITRITE NEGATIVE 03/09/2011 0857   LEUKOCYTESUR LARGE* 03/09/2011 0857    STUDIES: No results found.  ASSESSMENT: 64 y.o.  [place] woman status post right breast lower inner quadrant biopsy 09/24/2014 for a clinical T1a N0, stage IA invasive ductal carcinoma, grade 1 or 2, estrogen and progesterone receptor negative, with an MIB-1 of 41%, and HER-2 negative   (1) status post right lumpectomy and axillary sentinel lymph node sampling 10/17/2014 for a pT1b  pN0, stage IA  invasive ductal carcinoma, grade 3, repeat HER-2 again negative.   (2) adjuvant chemotherapy to consist of cyclophosphamide and docetaxel given every 3 weeks 4 with Neulasta support   (3) adjuvant radiation to follow chemotherapy   (4) status post partial right colectomy with lymphadenectomy 03/30/2012 for a 2.7 cm tubular adenoma with high-grade dysplasia, no evidence of invasion, 0 of 11 lymph nodes involved ([id number])  (5) genetics testing sent 10/04/2014 through the [facility] gene panel /[facility] found no deleterious mutations in ATM, BARD1, BRCA1, BRCA2, BRIP1, CDH1, CHEK2, EPCAM, MLH1, MRE11A, MSH2, MSH6, MUTYH, NBN, NF1, PALB2, PMS2, PTEN, RAD50, RAD51C, RAD51D, SMARCA4, STK11, or TP53  (a) two variants of uncertain significance were found    (i) ATM, p.[id number]   (ii) SMARCA4, p.[id number]  (6) left IJ DVT 12/02/14, started on xarelto  (7) elevated blood glucose. A1c on 12/03/14 was 6.4.  PLAN: The labs were reviewed in detail and were stable. We discussed her possible neuropathy symptoms, but at this time they are very mild. She will proceed with cycle 3 of cyclophosphamide and docetaxel as planned today. It is possible that we may have to remove the docetaxel from her final cycle however.   She was give samples of aquaphor for her dry hands. I also placed the referral order to Dr. Charlton Amor office for a rad onc consult visit.   Jordan [name] will return in 1 week for a nadir visit. She understands and agrees with this plan. She knows the goal of treatment in her case is cure. She has been encouraged to call with any issues that might arise before her next visit here.   Laurie Panda, NP   01/20/2015 4:22 PM

## 2015-01-27 ENCOUNTER — Encounter: Payer: Self-pay | Admitting: Nurse Practitioner

## 2015-01-27 ENCOUNTER — Ambulatory Visit (HOSPITAL_BASED_OUTPATIENT_CLINIC_OR_DEPARTMENT_OTHER): Payer: Medicare PPO | Admitting: Nurse Practitioner

## 2015-01-27 ENCOUNTER — Other Ambulatory Visit (HOSPITAL_BASED_OUTPATIENT_CLINIC_OR_DEPARTMENT_OTHER): Payer: Medicare PPO

## 2015-01-27 VITALS — BP 102/58 | HR 99 | Temp 98.1°F | Resp 18 | Ht 61.25 in | Wt 225.1 lb

## 2015-01-27 DIAGNOSIS — Z171 Estrogen receptor negative status [ER-]: Secondary | ICD-10-CM | POA: Diagnosis not present

## 2015-01-27 DIAGNOSIS — R7309 Other abnormal glucose: Secondary | ICD-10-CM

## 2015-01-27 DIAGNOSIS — I82C12 Acute embolism and thrombosis of left internal jugular vein: Secondary | ICD-10-CM

## 2015-01-27 DIAGNOSIS — C50311 Malignant neoplasm of lower-inner quadrant of right female breast: Secondary | ICD-10-CM

## 2015-01-27 LAB — COMPREHENSIVE METABOLIC PANEL (CC13)
ALT: 29 U/L (ref 0–55)
AST: 17 U/L (ref 5–34)
Albumin: 3.1 g/dL — ABNORMAL LOW (ref 3.5–5.0)
Alkaline Phosphatase: 89 U/L (ref 40–150)
Anion Gap: 11 mEq/L (ref 3–11)
BILIRUBIN TOTAL: 1.22 mg/dL — AB (ref 0.20–1.20)
BUN: 15.6 mg/dL (ref 7.0–26.0)
CHLORIDE: 106 meq/L (ref 98–109)
CO2: 24 meq/L (ref 22–29)
CREATININE: 1.1 mg/dL (ref 0.6–1.1)
Calcium: 9 mg/dL (ref 8.4–10.4)
EGFR: 61 mL/min/{1.73_m2} — AB (ref >90)
Glucose: 119 mg/dl (ref 70–140)
Potassium: 3.4 mEq/L — ABNORMAL LOW (ref 3.5–5.1)
Sodium: 141 mEq/L (ref 136–145)
Total Protein: 6.1 g/dL — ABNORMAL LOW (ref 6.4–8.3)

## 2015-01-27 LAB — CBC WITH DIFFERENTIAL/PLATELET
BASO%: 1.3 % (ref 0.0–2.0)
Basophils Absolute: 0.1 10*3/uL (ref 0.0–0.1)
EOS%: 6.5 % (ref 0.0–7.0)
Eosinophils Absolute: 0.5 10*3/uL (ref 0.0–0.5)
HEMATOCRIT: 37.5 % (ref 34.8–46.6)
HGB: 12.2 g/dL (ref 11.6–15.9)
LYMPH%: 13.1 % — AB (ref 14.0–49.7)
MCH: 28.3 pg (ref 25.1–34.0)
MCHC: 32.5 g/dL (ref 31.5–36.0)
MCV: 87.2 fL (ref 79.5–101.0)
MONO#: 1.7 10*3/uL — AB (ref 0.1–0.9)
MONO%: 24.3 % — AB (ref 0.0–14.0)
NEUT#: 3.9 10*3/uL (ref 1.5–6.5)
NEUT%: 54.8 % (ref 38.4–76.8)
PLATELETS: 150 10*3/uL (ref 145–400)
RBC: 4.3 10*6/uL (ref 3.70–5.45)
RDW: 17.6 % — ABNORMAL HIGH (ref 11.2–14.5)
WBC: 7.1 10*3/uL (ref 3.9–10.3)
lymph#: 0.9 10*3/uL (ref 0.9–3.3)

## 2015-01-27 MED ORDER — LORAZEPAM 0.5 MG PO TABS: 0.5000 mg | ORAL_TABLET | Freq: Every evening | ORAL | Status: DC | PRN

## 2015-01-27 NOTE — Progress Notes (Signed)
[facility]  [phone] [phone]     ID: JANAA [name] DOB: August 28, 1950  MR#: [id number]  SPQ#:[id number]  Patient Care Team: Jordan Mons, PA-C as PCP - General (Physician Assistant) Jordan Messing III, MD as Consulting Physician (General Surgery) Jordan Cruel, MD as Consulting Physician (Oncology) Jordan Koh, MD as Consulting Physician (Radiation Oncology) Jordan Kaufmann, RN as Registered Nurse Jordan Germany, RN as Registered Nurse Jordan Bouche, NP as Nurse Practitioner (Nurse Practitioner) PCP: Jordan [name] OTHER MD: Jordan Kato MD  CHIEF COMPLAINT: Early stage triple negative breast cancer  CURRENT TREATMENT: adjuvant chemotherapy   BREAST CANCER HISTORY: From the original intake note:  "Jordan [name]" [facility] July [facility] 2015 showing breast density category B. A possible mass in the right breast was noted and on Jerry 20 03/05/2015 the patient underwent right diagnostic mammography with right ultrasonography. Spot compression views confirmed a slightly irregular mass in the lower inner quadrant of the right breast. This was not palpable by physical exam. Ultrasound showed a small hypoechoic mass measuring 5 mm in the area in question. Ultrasound of the right axilla was unremarkable.  Biopsy of the mass in question fibrin 05/06/2015 showed ([id number]) and invasive ductal carcinoma, grade 2, estrogen and progesterone receptor negative, with an MIB-1 of 41%, and no HER-2 amplification.  Bilateral breast MRI was obtained 10/04/2014. Results are pending.  The patient's subsequent history is as detailed below   INTERVAL HISTORY:  Jordan [name] returns today for follow up of her breast cancer. Today is day 8, cycle 3 of cyclophosphamide and docetaxel given every 3 weeks 4 with Neulasta support.  REVIEW OF SYSTEMS: Jordan [name] denies fevers, chills, nausea, vomiting, or changes in bowel habits. She  has had urinary urge incontinence for a few weeks now, but it is worse lately. She drinking lots of fluids and has little time to make it to the bathroom when she does feel like she needs to go. Her appetite is fair, secondary to taste changes. Her fingertips are still achy, but no worse than last week. She denies numbness or tingling. She is fatigued, but sleeps well. A detailed review of systems is otherwise stable.  PAST MEDICAL HISTORY: Past Medical History  Diagnosis Date  . Hyperlipidemia     takes Zetia and Zocor daily  . Thyroid disease   . Nasal congestion   . Leg swelling   . Constipation   . Nausea   . Generalized headaches     due to allergies, sinus  . PONV (postoperative nausea and vomiting)     pt states she is very easy to sedate  . Hypertension     takes Maxzide and Metoprolol daily  . Pneumonia     walking about 6-55yr ago  . History of bronchitis     last time about 6-762yrago  . Hx of seasonal allergies     takes OTC allergy med nightly  . History of migraine     last one about 15+yrs ago  . Dizziness     r/t side effects from meds  . Vertigo     takes Meclizine prn  . Joint pain   . Back pain     buldging disc  . GERD (gastroesophageal reflux disease)     takes Protonix as needed  . Hemorrhoids   . History of colon polyps   . Mass of colon   . Cancer     colon  .  History of UTI   . Hypothyroidism     takes Synthroid daily  . Depression     takes Wellbutrin daily  . Wears glasses     PAST SURGICAL HISTORY: Past Surgical History  Procedure Laterality Date  . Cesarean section  1968, 1970  . Knee surgery  1998    right - arthroscopic  . Abdominal hysterectomy    . Exploratory laparotomy    . Carpal tunnel release    . Colonoscopy    . Esophagogastroduodenoscopy    . Partial colectomy  03/30/2012    Procedure: PARTIAL COLECTOMY;  Surgeon: Jordan Ober, MD;  Location: [facility];  Service: General;  Laterality: N/A;  . Appendectomy    .  Radioactive seed guided mastectomy with axillary sentinel lymph node biopsy Right 10/17/2014    Procedure: RADIOACTIVE SEED GUIDED PARTIAL MASTECTOMY WITH AXILLARY SENTINEL LYMPH NODE BIOPSY;  Surgeon: Jordan Messing III, MD;  Location: [facility];  Service: General;  Laterality: Right;    FAMILY HISTORY Family History  Problem Relation Age of Onset  . Cancer Father 50    prostate  . Cancer Brother 60    colon  . Deep vein thrombosis Brother   . Cancer Maternal Aunt 14    breast  . Cancer Maternal Grandmother 53    breast  . Cancer Paternal Grandmother     colon  . Pulmonary embolism Brother     long-distance truck driver (PE x 2)  . Cancer Brother 90    colon  . Diabetes Maternal Grandfather   . Cancer Maternal Aunt 75    unknown type  . Mental illness Sister     history of Depression  . Seizures Sister 8    s/p traumatic brain injury  . Cancer Maternal Uncle     prostate   the patient's parents are still living, as of February 2016. Her father is 85 years old, with a history of prostate cancer diagnosed in his late 64s. The patient's mother is 73 years old. The patient had 5 brothers, 3 sisters. One brother died with prostate cancer at age 39. Another brother has a history of prostate cancer, age 78. One brother had colon cancer diagnosed age 6 as did a paternal grandmother. A maternal grandmother had breast cancer at age 68 as did a maternal aunt (diagnosed age 86.) A cousin was diagnosed with breast cancer the age of 49.  GYNECOLOGIC HISTORY:  No LMP recorded. Patient has had a hysterectomy. Menarche age 55, first live birth age 86. The patient is GX P1. She stopped having periods in 1981 when she underwent hysterectomy and unilateral salpingo-oophorectomy.. She did not take hormone replacement.  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 Her husband Jordan [name] owns a business that [employer] at night. The patient's biological son Jordan [name] works in  Biomedical scientist in [place]. He has 2 children. One of them, the patient's granddaughter Jordan [name], 106 years old, lives with the patient, and is a Interior and spatial designer at [employer]. The patient also has an adopted son, Jordan Moore, lives in [place]. Jordan [name] attends a city of refugee church in [place] where she grew up    ADVANCED DIRECTIVES: Not in place  HEALTH MAINTENANCE: History  Substance Use Topics  . Smoking status: Never Smoker   . Smokeless tobacco: Never Used  . Alcohol Use: No     Colonoscopy: 03/30/2013/[facility]  PAP:  Bone density:  Lipid panel:  No Known Allergies  Current Outpatient  Prescriptions  Medication Sig Dispense Refill  . buPROPion (WELLBUTRIN XL) 150 MG 24 hr tablet Take 1 tablet (150 mg total) by mouth daily. 30 tablet 5  . ezetimibe (ZETIA) 10 MG tablet Take 1 tablet (10 mg total) by mouth daily. 30 tablet 5  . levothyroxine (SYNTHROID, LEVOTHROID) 125 MCG tablet TAKE 1 TABLET BY MOUTH DAILY 90 tablet 1  . meloxicam (MOBIC) 15 MG tablet Take 1 tablet (15 mg total) by mouth daily. 30 tablet 0  . metoprolol succinate (TOPROL-XL) 25 MG 24 hr tablet Take 1 tablet (25 mg total) by mouth daily. 30 tablet 5  . rivaroxaban (XARELTO) 20 MG TABS tablet Take 1 tablet (20 mg total) by mouth daily with supper. (Patient taking differently: Take 20 mg by mouth daily. ) 30 tablet 3  . simvastatin (ZOCOR) 40 MG tablet Take 1 tablet (40 mg total) by mouth at bedtime. 30 tablet 5  . tobramycin-dexamethasone (TOBRADEX) ophthalmic solution Place 1 drop into both eyes 2 (two) times daily. 5 mL 0  . triamterene-hydrochlorothiazide (MAXZIDE-25) 37.5-25 MG per tablet TAKE 1 TABLET DAILY (Patient taking differently: 0.5 tablets. TAKE 1 TABLET DAILY) 30 tablet 12  . dexamethasone (DECADRON) 4 MG tablet Take 2 tablets (8 mg total) by mouth 2 (two) times daily. Start the day before Taxotere. Then again the day after chemo for 3 days. (Patient not taking: Reported on 01/27/2015) 30  tablet 1  . lidocaine-prilocaine (EMLA) cream Apply to affected area once (Patient not taking: Reported on 01/27/2015) 30 g 3  . loratadine (CLARITIN) 10 MG tablet Take 1 tablet (10 mg total) by mouth daily. (Patient not taking: Reported on 12/30/2014) 30 tablet 4  . LORazepam (ATIVAN) 0.5 MG tablet Take 1 tablet (0.5 mg total) by mouth at bedtime as needed (Nausea or vomiting). 30 tablet 0  . meclizine (ANTIVERT) 25 MG tablet Take 1 tablet (25 mg total) by mouth 4 (four) times daily as needed for dizziness. (Patient not taking: Reported on 12/02/2014) 30 tablet 1  . nitroGLYCERIN (NITROSTAT) 0.4 MG SL tablet Place 1 tablet (0.4 mg total) under the tongue every 5 (five) minutes as needed for chest pain. (Patient not taking: Reported on 12/09/2014) 30 tablet 1  . ondansetron (ZOFRAN) 8 MG tablet Take 1 tablet (8 mg total) by mouth 2 (two) times daily. Start the day after chemo for 3 days. Then take as needed for nausea or vomiting. (Patient not taking: Reported on 01/27/2015) 30 tablet 1  . Prenatal Multivit-Min-Fe-FA (PRENATAL VITAMINS PO) Take by mouth.    . prochlorperazine (COMPAZINE) 10 MG tablet Take 1 tablet (10 mg total) by mouth every 6 (six) hours as needed (Nausea or vomiting). (Patient not taking: Reported on 01/20/2015) 30 tablet 1   No current facility-administered medications for this visit.   Facility-Administered Medications Ordered in Other Visits  Medication Dose Route Frequency Provider Last Rate Last Dose  . chlorhexidine (HIBICLENS) 4 % liquid 1 application  1 application Topical Once Jordan Messing III, MD      . chlorhexidine (HIBICLENS) 4 % liquid 1 application  1 application Topical Once Jordan Messing III, MD        OBJECTIVE: Middle-aged African-American woman who appears stated age 21 Vitals:   01/27/15 1044  BP: 102/58  Pulse: 99  Temp: 98.1 F (36.7 C)  Resp: 18     Body mass index is 42.17 kg/([name]^2).    ECOG FS:1 - Symptomatic but completely ambulatory  Sclerae unicteric,  pupils round and equal Oropharynx  clear and moist-- no thrush or other lesions No cervical or supraclavicular adenopathy Lungs no rales or rhonchi Heart regular rate and rhythm Abd soft, nontender, positive bowel sounds MSK no focal spinal tenderness, no upper extremity lymphedema Neuro: nonfocal, well oriented, appropriate affect Breasts: deferred  LAB RESULTS:  CMP     Component Value Date/Time   NA 141 01/27/2015 1015   NA 144 12/03/2014 0340   K 3.4* 01/27/2015 1015   K 4.0 12/03/2014 0340   CL 108 12/03/2014 0340   CO2 24 01/27/2015 1015   CO2 24 12/03/2014 0340   GLUCOSE 119 01/27/2015 1015   GLUCOSE 105* 12/03/2014 0340   BUN 15.6 01/27/2015 1015   BUN 13 12/03/2014 0340   CREATININE 1.1 01/27/2015 1015   CREATININE 0.98 12/03/2014 0340   CREATININE 1.04 08/20/2014 1238   CALCIUM 9.0 01/27/2015 1015   CALCIUM 8.9 12/03/2014 0340   PROT 6.1* 01/27/2015 1015   PROT 6.9 08/20/2014 1238   ALBUMIN 3.1* 01/27/2015 1015   ALBUMIN 3.9 08/20/2014 1238   AST 17 01/27/2015 1015   AST 23 08/20/2014 1238   ALT 29 01/27/2015 1015   ALT 24 08/20/2014 1238   ALKPHOS 89 01/27/2015 1015   ALKPHOS 54 08/20/2014 1238   BILITOT 1.22* 01/27/2015 1015   BILITOT 0.6 08/20/2014 1238   GFRNONAA 59* 12/03/2014 0340   GFRAA 68* 12/03/2014 0340    INo results found for: SPEP, UPEP  Lab Results  Component Value Date   WBC 7.1 01/27/2015   NEUTROABS 3.9 01/27/2015   HGB 12.2 01/27/2015   HCT 37.5 01/27/2015   MCV 87.2 01/27/2015   PLT 150 01/27/2015      Chemistry      Component Value Date/Time   NA 141 01/27/2015 1015   NA 144 12/03/2014 0340   K 3.4* 01/27/2015 1015   K 4.0 12/03/2014 0340   CL 108 12/03/2014 0340   CO2 24 01/27/2015 1015   CO2 24 12/03/2014 0340   BUN 15.6 01/27/2015 1015   BUN 13 12/03/2014 0340   CREATININE 1.1 01/27/2015 1015   CREATININE 0.98 12/03/2014 0340   CREATININE 1.04 08/20/2014 1238      Component Value Date/Time   CALCIUM 9.0  01/27/2015 1015   CALCIUM 8.9 12/03/2014 0340   ALKPHOS 89 01/27/2015 1015   ALKPHOS 54 08/20/2014 1238   AST 17 01/27/2015 1015   AST 23 08/20/2014 1238   ALT 29 01/27/2015 1015   ALT 24 08/20/2014 1238   BILITOT 1.22* 01/27/2015 1015   BILITOT 0.6 08/20/2014 1238       No results found for: LABCA2  No components found for: [id number]  No results for input(s): INR in the last 168 hours.  Urinalysis    Component Value Date/Time   COLORURINE YELLOW 03/09/2011 0857   APPEARANCEUR CLOUDY* 03/09/2011 0857   LABSPEC 1.022 03/09/2011 0857   PHURINE 5.5 03/09/2011 0857   GLUCOSEU NEGATIVE 03/09/2011 0857   HGBUR NEGATIVE 03/09/2011 0857   BILIRUBINUR SMALL* 03/09/2011 0857   KETONESUR NEGATIVE 03/09/2011 0857   PROTEINUR NEGATIVE 03/09/2011 0857   UROBILINOGEN 0.2 03/09/2011 0857   NITRITE NEGATIVE 03/09/2011 0857   LEUKOCYTESUR LARGE* 03/09/2011 0857    STUDIES: No results found.  ASSESSMENT: 64 y.o.  [place] woman status post right breast lower inner quadrant biopsy 09/24/2014 for a clinical T1a N0, stage IA invasive ductal carcinoma, grade 1 or 2, estrogen and progesterone receptor negative, with an MIB-1 of 41%, and HER-2 negative   (1)  status post right lumpectomy and axillary sentinel lymph node sampling 10/17/2014 for a pT1b pN0, stage IA  invasive ductal carcinoma, grade 3, repeat HER-2 again negative.   (2) adjuvant chemotherapy to consist of cyclophosphamide and docetaxel given every 3 weeks 4 with Neulasta support   (3) adjuvant radiation to follow chemotherapy   (4) status post partial right colectomy with lymphadenectomy 03/30/2012 for a 2.7 cm tubular adenoma with high-grade dysplasia, no evidence of invasion, 0 of 11 lymph nodes involved ([id number])  (5) genetics testing sent 10/04/2014 through the [facility] gene panel /[facility] found no deleterious mutations in ATM, BARD1, BRCA1, BRCA2, BRIP1, CDH1, CHEK2, EPCAM, MLH1, MRE11A, MSH2, MSH6, MUTYH, NBN,  NF1, PALB2, PMS2, PTEN, RAD50, RAD51C, RAD51D, SMARCA4, STK11, or TP53  (a) two variants of uncertain significance were found    (i) ATM, p.[id number]   (ii) SMARCA4, p.[id number]  (6) left IJ DVT 12/02/14, started on xarelto  (7) elevated blood glucose. A1c on 12/03/14 was 6.4.  PLAN: Overall, Jordan [name] is doing well today. I instructed her on how to perform "kegels" in order to strengthen her pelvic floor muscles and improve her incontinence. The labs were reviewed in detail and her has doubled to 1.22. We will continue to monitor this value, as it may drop by the time she is due for her final treatment.  She meets with Dr. Valere Dross next week to discuss radiation.   Jordan [name] will return in 2 weeks for her 4th and final cycle of cyclophosphamide and docetaxel. She understands and agrees with this plan. She knows the goal of treatment in her case is cure. She has been encouraged to call with any issues that might arise before her next visit here.   Laurie Panda, NP   01/27/2015 11:18 AM

## 2015-01-28 DIAGNOSIS — C50311 Malignant neoplasm of lower-inner quadrant of right female breast: Secondary | ICD-10-CM | POA: Diagnosis not present

## 2015-01-29 ENCOUNTER — Other Ambulatory Visit: Payer: Self-pay | Admitting: Oncology

## 2015-02-03 ENCOUNTER — Ambulatory Visit: Payer: Medicare PPO

## 2015-02-03 ENCOUNTER — Other Ambulatory Visit: Payer: Medicare PPO

## 2015-02-05 ENCOUNTER — Encounter: Payer: Self-pay | Admitting: Radiation Oncology

## 2015-02-05 NOTE — Progress Notes (Signed)
Location of Breast Cancer: Right Breast, Lower Inner Quadrant   Histology per Pathology Report: Right Breast  10/17/14 Diagnosis 1. Lymph node, sentinel, biopsy, right axillary - ONE LYMPH NODE, NEGATIVE FOR TUMOR (0/1). 2. Lymph node, sentinel, biopsy, right axillary - ONE LYMPH NODE, NEGATIVE FOR TUMOR (0/1). 3. Breast, lumpectomy, right - INVASIVE DUCTAL CARCINOMA, SEE COMMENT. - NEGATIVE FOR LYMPHOVASCULAR INVASIVE. - INVASIVE TUMOR IS 4 MM FROM THE NEAREST MARGIN (LATERAL). - SEE TUMOR SYNOPTIC TEMPLATE BELOW. 4. Breast, excision, right new superior margin - BENIGN BREAST TISSUE, SEE COMMENT. 1 of 4 FINAL for Schuermann, Tykia M [phone]) Diagnosis(continued) - NEGATIVE FOR ATYPIA OR MALIGNANCY. - CALCIFICATIONS IDENTIFIED. - SURGICAL MARGIN, NEGATIVE FOR ATYPIA OR MALIGNANCY  09/24/14 Diagnosis Breast, right, needle core biopsy, mass, LIQ, 3:30 4 cm fn - INVASIVE DUCTAL CARCINOMA. - DUCTAL CARCINOMA IN SITU. - SEE COMMENT.  Receptor Status: ER(0%), PR (0%), Her2-neu (Neg), Ki-67(41%)  Ms. Khanh M. Norkus had bilateral screening [facility] July 05 2015 showing breast density category B. A possible mass in the right breast was noted and on Jerry 20 03/05/2015 the patient underwent right diagnostic mammography with right ultrasonography. Spot compression views confirmed a slightly irregular mass in the lower inner quadrant of the right breast. This was not palpable by physical exam. Ultrasound showed a small hypoechoic mass measuring 5 mm in the area in question. Ultrasound of the right axilla was unremarkable.    Past/Anticipated interventions by surgeon, if any: Dr. Autumn Messing: Lumpectomy Right Breast  Past/Anticipated interventions by medical oncology, if any: Chemotherapy: Adjuvant chemotherapy to consist of cyclophosphamide and docetaxel given every 3 weeks 4 with Neulasta support  Lymphedema issues, if EHA:ZCUN    Pain issues, if any:  None  SAFETY ISSUES:  Prior radiation? No  Pacemaker/ICD? No  Possible current pregnancy?No  Is the patient on methotrexate? No  Current Complaints / other details:    No LMP recorded. Patient has had a hysterectomy. Menarche age 64, first live birth age 32. The patient is G3,P1.  BC ~ 3 years, She stopped having periods in 1981 when she underwent hysterectomy and unilateral salpingo-oophorectomy. Hormone replacement therapy less than 6 months.     Deirdre Evener, RN 02/05/2015,4:22 PM

## 2015-02-06 ENCOUNTER — Ambulatory Visit
Admission: RE | Admit: 2015-02-06 | Discharge: 2015-02-06 | Disposition: A | Discharge status: Home / Self Care | Payer: Medicare PPO | Source: Ambulatory Visit | Attending: Radiation Oncology | Admitting: Radiation Oncology

## 2015-02-06 ENCOUNTER — Encounter: Payer: Self-pay | Admitting: Radiation Oncology

## 2015-02-06 VITALS — BP 99/64 | HR 96 | Temp 98.5°F | Ht 61.0 in | Wt 218.8 lb

## 2015-02-06 DIAGNOSIS — C50311 Malignant neoplasm of lower-inner quadrant of right female breast: Secondary | ICD-10-CM

## 2015-02-06 DIAGNOSIS — Z510 Encounter for antineoplastic radiation therapy: Secondary | ICD-10-CM | POA: Diagnosis not present

## 2015-02-06 DIAGNOSIS — Z171 Estrogen receptor negative status [ER-]: Secondary | ICD-10-CM | POA: Insufficient documentation

## 2015-02-06 HISTORY — DX: Malignant neoplasm of unspecified site of unspecified female breast: C50.919

## 2015-02-06 NOTE — Addendum Note (Signed)
Encounter addended by: Benn Moulder, RN on: 02/06/2015  3:31 PM<BR>     Documentation filed: Arn Medal VN

## 2015-02-06 NOTE — Addendum Note (Signed)
Encounter addended by: Srikar Chiang M Rindi Beechy, RN on: 02/06/2015  1:16 PM<BR>     Documentation filed: Charges VN

## 2015-02-06 NOTE — Progress Notes (Signed)
CC: Dr. Autumn Messing  Follow-up note:  Diagnosis: Stage I A (T1b N0 M0) invasive ductal/DCIS of the right breast (triple negative)  History: [name] is a pleasant 64 year old female who is seen today for review and scheduling of her right breast radiation therapy in the management of her T1b N0 invasive ductal carcinoma of the right breast.  I first saw the patient in consultation at the multidisciplinary clinic on 10/02/2014.  At the time of a screening mammogram on 09/03/2014 she was felt to have a right breast mass. Additional views and ultrasound on 09/11/2014 showed a mass within the lower inner quadrant of the right breast at 3:30. Ultrasound showed a 0.5 x 0.4 x 0.4cm mass, 4 cm from the nipple. The axilla appeared to be benign. Right breast biopsy on 09/24/2014 was diagnostic for invasive ductal carcinoma, grade II and also high-grade DCIS. Her invasive disease was ER/PR negative with a Ki-67 of 41%.She was also HER-2/neu negative.  Breast MR on 10/04/2014 showed post biopsy changes along the lower inner quadrant of the right breast.  She underwent a right lumpectomy and sentinel lymph node biopsy by Dr. Marlou Starks on 10/17/2014.  She was found to have a 0.6 MEV primary tumor with the closest margin being 0.4 cm, laterally.  2 lymph nodes were free of metastatic disease.  She went on to receive adjuvant chemotherapy with Dr. Jana Hakim in her last cycle is this coming Monday, June 27.  Unfortunately, her Port-A-Cath worked only 1 time and she is had chemotherapy through her left upper extremity peripheral veins.  She is in good spirits.  Physical examination: Alert and oriented. Filed Vitals:   02/06/15 1038  BP: 99/64  Pulse: 96  Temp: 98.5 F (36.9 C)   Head and neck examination: Remarkable for alopecia.  Nodes: There is no palpable cervical, supraclavicular, or axillary lymphadenopathy.  Breasts: There is a partial mastectomy scar along the lower inner quadrant of the right breast at  approximately 4:00.  No masses are appreciated.  Left breast without masses or lesions.  Extremities: There appears to be trace left upper extremity lymphedema.  There also appears to be skin changes from recent superficial phlebitis along her left forearm.  Impression: Pathologic stage IA (T1b N0 M0) invasive ductal/DCIS of the right breast.  She is almost finished with her adjuvant chemotherapy.  We discussed right breast radiation therapy along with the potential acute and late toxicities.  I expect to have her return on July 18 for her CT simulation.  She'll undergo standard fractionation.  Consent is signed today.  Her prognosis appears to be favorable.  Plan: As above.  30 minutes was spent face-to-face with the patient, primarily counseling patient and coordinating her care.

## 2015-02-07 ENCOUNTER — Other Ambulatory Visit: Payer: Self-pay | Admitting: Nurse Practitioner

## 2015-02-07 NOTE — Telephone Encounter (Signed)
Too early

## 2015-02-10 ENCOUNTER — Ambulatory Visit (HOSPITAL_BASED_OUTPATIENT_CLINIC_OR_DEPARTMENT_OTHER): Payer: Medicare PPO | Admitting: Nurse Practitioner

## 2015-02-10 ENCOUNTER — Encounter: Payer: Self-pay | Admitting: Nurse Practitioner

## 2015-02-10 ENCOUNTER — Ambulatory Visit (HOSPITAL_BASED_OUTPATIENT_CLINIC_OR_DEPARTMENT_OTHER): Payer: Medicare PPO

## 2015-02-10 ENCOUNTER — Other Ambulatory Visit (HOSPITAL_BASED_OUTPATIENT_CLINIC_OR_DEPARTMENT_OTHER): Payer: Medicare PPO

## 2015-02-10 VITALS — BP 97/62 | HR 98 | Temp 98.8°F | Resp 18 | Wt 222.6 lb

## 2015-02-10 DIAGNOSIS — I82C12 Acute embolism and thrombosis of left internal jugular vein: Secondary | ICD-10-CM | POA: Diagnosis not present

## 2015-02-10 DIAGNOSIS — R7309 Other abnormal glucose: Secondary | ICD-10-CM

## 2015-02-10 DIAGNOSIS — Z171 Estrogen receptor negative status [ER-]: Secondary | ICD-10-CM | POA: Diagnosis not present

## 2015-02-10 DIAGNOSIS — Z7901 Long term (current) use of anticoagulants: Secondary | ICD-10-CM | POA: Diagnosis not present

## 2015-02-10 DIAGNOSIS — Z5111 Encounter for antineoplastic chemotherapy: Secondary | ICD-10-CM | POA: Diagnosis not present

## 2015-02-10 DIAGNOSIS — Z5189 Encounter for other specified aftercare: Secondary | ICD-10-CM | POA: Diagnosis not present

## 2015-02-10 DIAGNOSIS — C50311 Malignant neoplasm of lower-inner quadrant of right female breast: Secondary | ICD-10-CM | POA: Diagnosis not present

## 2015-02-10 LAB — COMPREHENSIVE METABOLIC PANEL (CC13)
ALT: 24 U/L (ref 0–55)
AST: 20 U/L (ref 5–34)
Albumin: 3.2 g/dL — ABNORMAL LOW (ref 3.5–5.0)
Alkaline Phosphatase: 80 U/L (ref 40–150)
Anion Gap: 10 mEq/L (ref 3–11)
BUN: 15 mg/dL (ref 7.0–26.0)
CO2: 22 mEq/L (ref 22–29)
Calcium: 9 mg/dL (ref 8.4–10.4)
Chloride: 109 mEq/L (ref 98–109)
Creatinine: 1.1 mg/dL (ref 0.6–1.1)
EGFR: 59 mL/min/{1.73_m2} — AB (ref >90)
GLUCOSE: 171 mg/dL — AB (ref 70–140)
Potassium: 3.7 mEq/L (ref 3.5–5.1)
SODIUM: 141 meq/L (ref 136–145)
Total Bilirubin: 0.57 mg/dL (ref 0.20–1.20)
Total Protein: 6.7 g/dL (ref 6.4–8.3)

## 2015-02-10 LAB — CBC WITH DIFFERENTIAL/PLATELET
BASO%: 0.1 % (ref 0.0–2.0)
Basophils Absolute: 0 10*3/uL (ref 0.0–0.1)
EOS ABS: 0 10*3/uL (ref 0.0–0.5)
EOS%: 0 % (ref 0.0–7.0)
HCT: 38 % (ref 34.8–46.6)
HGB: 12.5 g/dL (ref 11.6–15.9)
LYMPH#: 1.1 10*3/uL (ref 0.9–3.3)
LYMPH%: 6.4 % — ABNORMAL LOW (ref 14.0–49.7)
MCH: 28.7 pg (ref 25.1–34.0)
MCHC: 32.9 g/dL (ref 31.5–36.0)
MCV: 87.4 fL (ref 79.5–101.0)
MONO#: 0.6 10*3/uL (ref 0.1–0.9)
MONO%: 3.5 % (ref 0.0–14.0)
NEUT%: 90 % — ABNORMAL HIGH (ref 38.4–76.8)
NEUTROS ABS: 15.1 10*3/uL — AB (ref 1.5–6.5)
Platelets: 311 10*3/uL (ref 145–400)
RBC: 4.35 10*6/uL (ref 3.70–5.45)
RDW: 18 % — AB (ref 11.2–14.5)
WBC: 16.8 10*3/uL — AB (ref 3.9–10.3)

## 2015-02-10 MED ORDER — PEGFILGRASTIM 6 MG/0.6ML ~~LOC~~ PSKT
6.0000 mg | PREFILLED_SYRINGE | Freq: Once | SUBCUTANEOUS | Status: AC
  Administered 2015-02-10: 6 mg via SUBCUTANEOUS
  Filled 2015-02-10: qty 0.6

## 2015-02-10 MED ORDER — TOBRAMYCIN-DEXAMETHASONE 0.3-0.1 % OP SUSP: 1.0000 [drp] | Freq: Two times a day (BID) | OPHTHALMIC | Status: DC

## 2015-02-10 MED ORDER — SODIUM CHLORIDE 0.9 % IV SOLN
600.0000 mg/m2 | Freq: Once | INTRAVENOUS | Status: AC
  Administered 2015-02-10: 1280 mg via INTRAVENOUS
  Filled 2015-02-10: qty 64

## 2015-02-10 MED ORDER — HEPARIN SOD (PORK) LOCK FLUSH 100 UNIT/ML IV SOLN
500.0000 [IU] | Freq: Once | INTRAVENOUS | Status: AC | PRN
  Administered 2015-02-10: 500 [IU]
  Filled 2015-02-10: qty 5

## 2015-02-10 MED ORDER — LORATADINE 10 MG PO TABS: 10.0000 mg | ORAL_TABLET | Freq: Every day | ORAL | Status: DC

## 2015-02-10 MED ORDER — SODIUM CHLORIDE 0.9 % IV SOLN
Freq: Once | INTRAVENOUS | Status: AC
  Administered 2015-02-10: 13:00:00 via INTRAVENOUS
  Filled 2015-02-10: qty 8

## 2015-02-10 MED ORDER — SODIUM CHLORIDE 0.9 % IJ SOLN
10.0000 mL | INTRAMUSCULAR | Status: DC | PRN
  Administered 2015-02-10: 10 mL
  Filled 2015-02-10: qty 10

## 2015-02-10 MED ORDER — DOCETAXEL CHEMO INJECTION 160 MG/16ML
50.0000 mg/m2 | Freq: Once | INTRAVENOUS | Status: AC
  Administered 2015-02-10: 110 mg via INTRAVENOUS
  Filled 2015-02-10: qty 11

## 2015-02-10 MED ORDER — SODIUM CHLORIDE 0.9 % IV SOLN
Freq: Once | INTRAVENOUS | Status: AC
  Administered 2015-02-10: 13:00:00 via INTRAVENOUS

## 2015-02-10 NOTE — Patient Instructions (Signed)
[facility] Discharge Instructions for Patients Receiving Chemotherapy  Today you received the following chemotherapy agents Taxotere, Cytoxan.  To help prevent nausea and vomiting after your treatment, we encourage you to take your nausea medication as directed.    If you develop nausea and vomiting that is not controlled by your nausea medication, call the clinic.   BELOW ARE SYMPTOMS THAT SHOULD BE REPORTED IMMEDIATELY:  *FEVER GREATER THAN 100.5 F  *CHILLS WITH OR WITHOUT FEVER  NAUSEA AND VOMITING THAT IS NOT CONTROLLED WITH YOUR NAUSEA MEDICATION  *UNUSUAL SHORTNESS OF BREATH  *UNUSUAL BRUISING OR BLEEDING  TENDERNESS IN MOUTH AND THROAT WITH OR WITHOUT PRESENCE OF ULCERS  *URINARY PROBLEMS  *BOWEL PROBLEMS  UNUSUAL RASH Items with * indicate a potential emergency and should be followed up as soon as possible.  Feel free to call the clinic you have any questions or concerns. The clinic phone number is [phone].  Please show the [facility] at check-in to the Emergency Department and triage nurse.  Pegfilgrastim injection What is this medicine? PEGFILGRASTIM (peg fil GRA stim) is a long-acting granulocyte colony-stimulating factor that stimulates the growth of neutrophils, a type of white blood cell important in the body's fight against infection. It is used to reduce the incidence of fever and infection in patients with certain types of cancer who are receiving chemotherapy that affects the bone marrow. This medicine may be used for other purposes; ask your health care provider or pharmacist if you have questions. COMMON BRAND NAME(S): Neulasta What should I tell my health care provider before I take this medicine? They need to know if you have any of these conditions: -latex allergy -ongoing radiation therapy -sickle cell disease -skin reactions to acrylic adhesives (On-Body Injector only) -an unusual or allergic reaction to pegfilgrastim,  filgrastim, other medicines, foods, dyes, or preservatives -pregnant or trying to get pregnant -breast-feeding How should I use this medicine? This medicine is for injection under the skin. If you get this medicine at home, you will be taught how to prepare and give the pre-filled syringe or how to use the On-body Injector. Refer to the patient Instructions for Use for detailed instructions. Use exactly as directed. Take your medicine at regular intervals. Do not take your medicine more often than directed. It is important that you put your used needles and syringes in a special sharps container. Do not put them in a trash can. If you do not have a sharps container, call your pharmacist or healthcare provider to get one. Talk to your pediatrician regarding the use of this medicine in children. Special care may be needed. Overdosage: If you think you have taken too much of this medicine contact a poison control center or emergency room at once. NOTE: This medicine is only for you. Do not share this medicine with others. What if I miss a dose? It is important not to miss your dose. Call your doctor or health care professional if you miss your dose. If you miss a dose due to an On-body Injector failure or leakage, a new dose should be administered as soon as possible using a single prefilled syringe for manual use. What may interact with this medicine? Interactions have not been studied. Give your health care provider a list of all the medicines, herbs, non-prescription drugs, or dietary supplements you use. Also tell them if you smoke, drink alcohol, or use illegal drugs. Some items may interact with your medicine. This list may not describe  all possible interactions. Give your health care provider a list of all the medicines, herbs, non-prescription drugs, or dietary supplements you use. Also tell them if you smoke, drink alcohol, or use illegal drugs. Some items may interact with your medicine. What  should I watch for while using this medicine? You may need blood work done while you are taking this medicine. If you are going to need a MRI, CT scan, or other procedure, tell your doctor that you are using this medicine (On-Body Injector only). What side effects may I notice from receiving this medicine? Side effects that you should report to your doctor or health care professional as soon as possible: -allergic reactions like skin rash, itching or hives, swelling of the face, lips, or tongue -dizziness -fever -pain, redness, or irritation at site where injected -pinpoint red spots on the skin -shortness of breath or breathing problems -stomach or side pain, or pain at the shoulder -swelling -tiredness -trouble passing urine Side effects that usually do not require medical attention (report to your doctor or health care professional if they continue or are bothersome): -bone pain -muscle pain This list may not describe all possible side effects. Call your doctor for medical advice about side effects. You may report side effects to FDA at 1-800-FDA-1088. Where should I keep my medicine? Keep out of the reach of children. Store pre-filled syringes in a refrigerator between 2 and 8 degrees C (36 and 46 degrees F). Do not freeze. Keep in carton to protect from light. Throw away this medicine if it is left out of the refrigerator for more than 48 hours. Throw away any unused medicine after the expiration date. NOTE: This sheet is a summary. It may not cover all possible information. If you have questions about this medicine, talk to your doctor, pharmacist, or health care provider.  2015, Elsevier/Gold Standard. (2013-11-01 16:14:05)

## 2015-02-10 NOTE — Progress Notes (Signed)
[facility]  Telephone:([phone] [phone]) [phone]     ID: Jordan [name] DOB: 09/18/1950  MR#: [id number]  VWA#:[id number]  Patient Care Team: Harrison Mons, PA-C as PCP - General (Physician Assistant) [name], MD as Consulting Physician (General Surgery) Chauncey Cruel, MD as Consulting Physician (Oncology) Arloa Koh, MD as Consulting Physician (Radiation Oncology) Mauro Kaufmann, RN as Registered Nurse Rockwell Germany, RN as Registered Nurse Holley Bouche, NP as Nurse Practitioner (Nurse Practitioner) PCP: [name] OTHER MD: [name] MD  CHIEF COMPLAINT: Early stage triple negative breast cancer  CURRENT TREATMENT: adjuvant chemotherapy   BREAST CANCER HISTORY: From the original intake note:  "Jordan [name]" [facility] July [facility] 2015 showing breast density category B. A possible mass in the right breast was noted and on Jerry 20 03/05/2015 the patient underwent right diagnostic mammography with right ultrasonography. Spot compression views confirmed a slightly irregular mass in the lower inner quadrant of the right breast. This was not palpable by physical exam. Ultrasound showed a small hypoechoic mass measuring 5 mm in the area in question. Ultrasound of the right axilla was unremarkable.  Biopsy of the mass in question fibrin 05/06/2015 showed ([id number]) and invasive ductal carcinoma, grade 2, estrogen and progesterone receptor negative, with an MIB-1 of 41%, and no HER-2 amplification.  Bilateral breast MRI was obtained 10/04/2014. Results are pending.  The patient's subsequent history is as detailed below   INTERVAL HISTORY:  Jordan [name] returns today for follow up of her breast cancer. Today is day 1, cycle 4 of cyclophosphamide and docetaxel given every 3 weeks 4 with Neulasta support.  REVIEW OF SYSTEMS: Jordan [name] is very fatigued and sluggish today. She states she is drinking plenty  of fluids and is sleeping well. She denies fevers, chills, nausea, vomiting, or change sin bowel or bladder habits. Her appetite is decreased however, and she is down 3lb. She denies mouth sores or rashes. Her fingers are no longer achy, and were never numb or tingly. A detailed review of systems is otherwise stable.   PAST MEDICAL HISTORY: Past Medical History  Diagnosis Date  . Hyperlipidemia     takes Zetia and Zocor daily  . Thyroid disease   . Nasal congestion   . Leg swelling   . Constipation   . Nausea   . Generalized headaches     due to allergies, sinus  . PONV (postoperative nausea and vomiting)     pt states she is very easy to sedate  . Hypertension     takes Maxzide and Metoprolol daily  . Pneumonia     walking about 6-88yr ago  . History of bronchitis     last time about 6-737yrago  . Hx of seasonal allergies     takes OTC allergy med nightly  . History of migraine     last one about 15+yrs ago  . Dizziness     r/t side effects from meds  . Vertigo     takes Meclizine prn  . Joint pain   . Back pain     buldging disc  . GERD (gastroesophageal reflux disease)     takes Protonix as needed  . Hemorrhoids   . History of colon polyps   . Mass of colon   . Cancer     colon  . History of UTI   . Hypothyroidism     takes Synthroid daily  . Depression  takes Wellbutrin daily  . Wears glasses   . Breast cancer 10/17/2014    lower inner quadrant of the right breast    PAST SURGICAL HISTORY: Past Surgical History  Procedure Laterality Date  . Cesarean section  1968, 1970  . Knee surgery  1998    right - arthroscopic  . Abdominal hysterectomy    . Exploratory laparotomy    . Carpal tunnel release    . Colonoscopy    . Esophagogastroduodenoscopy    . Partial colectomy  03/30/2012    Procedure: PARTIAL COLECTOMY;  Surgeon: [name], MD;  Location: [facility];  Service: General;  Laterality: N/A;  . Appendectomy    . Radioactive seed guided mastectomy  with axillary sentinel lymph node biopsy Right 10/17/2014    Procedure: RADIOACTIVE SEED GUIDED PARTIAL MASTECTOMY WITH AXILLARY SENTINEL LYMPH NODE BIOPSY;  Surgeon: [name], MD;  Location: [facility];  Service: General;  Laterality: Right;    FAMILY HISTORY Family History  Problem Relation Age of Onset  . Cancer Father 66    prostate  . Cancer Brother 59    colon  . Deep vein thrombosis Brother   . Cancer Maternal Aunt 70    breast  . Cancer Maternal Grandmother 79    breast  . Cancer Paternal Grandmother     colon  . Pulmonary embolism Brother     long-distance truck driver (PE x 2)  . Cancer Brother 74    colon  . Diabetes Maternal Grandfather   . Cancer Maternal Aunt 75    unknown type  . Mental illness Sister     history of Depression  . Seizures Sister 8    s/p traumatic brain injury  . Cancer Maternal Uncle     prostate   the patient's parents are still living, as of February 2016. Her father is 63 years old, with a history of prostate cancer diagnosed in his late 90s. The patient's mother is 36 years old. The patient had 5 brothers, 3 sisters. One brother died with prostate cancer at age 74. Another brother has a history of prostate cancer, age 94. One brother had colon cancer diagnosed age 57 as did a paternal grandmother. A maternal grandmother had breast cancer at age 45 as did a maternal aunt (diagnosed age 27.) A cousin was diagnosed with breast cancer the age of 42.  GYNECOLOGIC HISTORY:  No LMP recorded. Patient has had a hysterectomy. Menarche age 58, first live birth age 52. The patient is GX P1. She stopped having periods in 1981 when she underwent hysterectomy and unilateral salpingo-oophorectomy.. She did not take hormone replacement.  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 Her husband Jordan [name] owns a business that [employer] at night. The patient's biological son Jordan [name] works in Biomedical scientist in [place]. He has 2  children. One of them, the patient's granddaughter Jordan [name], 56 years old, lives with the patient, and is a Interior and spatial designer at [employer]. The patient also has an adopted son, Jordan [name], lives in [place]. Jordan [name] attends a city of refugee church in [place] where she grew up    ADVANCED DIRECTIVES: Not in place  HEALTH MAINTENANCE: History  Substance Use Topics  . Smoking status: Never Smoker   . Smokeless tobacco: Never Used  . Alcohol Use: No     Colonoscopy: 03/30/2013/[facility]  PAP:  Bone density:  Lipid panel:  No Known Allergies  Current Outpatient Prescriptions  Medication Sig Dispense Refill  .  buPROPion (WELLBUTRIN XL) 150 MG 24 hr tablet Take 1 tablet (150 mg total) by mouth daily. 30 tablet 5  . dexamethasone (DECADRON) 4 MG tablet Take 2 tablets (8 mg total) by mouth 2 (two) times daily. Start the day before Taxotere. Then again the day after chemo for 3 days. 30 tablet 1  . ezetimibe (ZETIA) 10 MG tablet Take 1 tablet (10 mg total) by mouth daily. 30 tablet 5  . levothyroxine (SYNTHROID, LEVOTHROID) 125 MCG tablet TAKE 1 TABLET BY MOUTH DAILY 90 tablet 1  . lidocaine-prilocaine (EMLA) cream Apply to affected area once 30 g 3  . loratadine (CLARITIN) 10 MG tablet Take 1 tablet (10 mg total) by mouth daily. 30 tablet 4  . LORazepam (ATIVAN) 0.5 MG tablet Take 1 tablet (0.5 mg total) by mouth at bedtime as needed (Nausea or vomiting). 30 tablet 0  . meloxicam (MOBIC) 15 MG tablet Take 1 tablet (15 mg total) by mouth daily. 30 tablet 0  . metoprolol succinate (TOPROL-XL) 25 MG 24 hr tablet Take 1 tablet (25 mg total) by mouth daily. 30 tablet 5  . ondansetron (ZOFRAN) 8 MG tablet Take 1 tablet (8 mg total) by mouth 2 (two) times daily. Start the day after chemo for 3 days. Then take as needed for nausea or vomiting. 30 tablet 1  . Prenatal Multivit-Min-Fe-FA (PRENATAL VITAMINS PO) Take by mouth.    . rivaroxaban (XARELTO) 20 MG TABS tablet Take 1 tablet (20  mg total) by mouth daily with supper. (Patient taking differently: Take 20 mg by mouth daily. ) 30 tablet 3  . simvastatin (ZOCOR) 40 MG tablet Take 1 tablet (40 mg total) by mouth at bedtime. 30 tablet 5  . tobramycin-dexamethasone (TOBRADEX) ophthalmic solution Place 1 drop into both eyes 2 (two) times daily. 5 mL 0  . triamterene-hydrochlorothiazide (MAXZIDE-25) 37.5-25 MG per tablet TAKE 1 TABLET DAILY (Patient taking differently: 0.5 tablets. TAKE 1 TABLET DAILY) 30 tablet 12  . meclizine (ANTIVERT) 25 MG tablet Take 1 tablet (25 mg total) by mouth 4 (four) times daily as needed for dizziness. (Patient not taking: Reported on 02/10/2015) 30 tablet 1  . nitroGLYCERIN (NITROSTAT) 0.4 MG SL tablet Place 1 tablet (0.4 mg total) under the tongue every 5 (five) minutes as needed for chest pain. (Patient not taking: Reported on 02/10/2015) 30 tablet 1  . prochlorperazine (COMPAZINE) 10 MG tablet Take 1 tablet (10 mg total) by mouth every 6 (six) hours as needed (Nausea or vomiting). (Patient not taking: Reported on 02/10/2015) 30 tablet 1   No current facility-administered medications for this visit.   Facility-Administered Medications Ordered in Other Visits  Medication Dose Route Frequency Provider Last Rate Last Dose  . chlorhexidine (HIBICLENS) 4 % liquid 1 application  1 application Topical Once [name], MD      . chlorhexidine (HIBICLENS) 4 % liquid 1 application  1 application Topical Once [name], MD        OBJECTIVE: Middle-aged African-American woman who appears stated age 64 Vitals:   02/10/15 1144  BP: 97/62  Pulse: 98  Temp: 98.8 F (37.1 C)  Resp: 18     Body mass index is 42.08 kg/(m^2).    ECOG FS:1 - Symptomatic but completely ambulatory  Skin: warm, dry  HEENT: sclerae anicteric, conjunctivae pink, oropharynx clear. No thrush or mucositis.  Lymph Nodes: No cervical or supraclavicular lymphadenopathy  Lungs: clear to auscultation bilaterally, no rales, wheezes,  or rhonci  Heart: regular rate and  rhythm  Abdomen: round, soft, non tender, positive bowel sounds  Musculoskeletal: No focal spinal tenderness, no peripheral edema  Neuro: non focal, well oriented, positive affect  Breasts: deferred  LAB RESULTS:  CMP     Component Value Date/Time   NA 141 02/10/2015 1109   NA 144 12/03/2014 0340   K 3.7 02/10/2015 1109   K 4.0 12/03/2014 0340   CL 108 12/03/2014 0340   CO2 22 02/10/2015 1109   CO2 24 12/03/2014 0340   GLUCOSE 171* 02/10/2015 1109   GLUCOSE 105* 12/03/2014 0340   BUN 15.0 02/10/2015 1109   BUN 13 12/03/2014 0340   CREATININE 1.1 02/10/2015 1109   CREATININE 0.98 12/03/2014 0340   CREATININE 1.04 08/20/2014 1238   CALCIUM 9.0 02/10/2015 1109   CALCIUM 8.9 12/03/2014 0340   PROT 6.7 02/10/2015 1109   PROT 6.9 08/20/2014 1238   ALBUMIN 3.2* 02/10/2015 1109   ALBUMIN 3.9 08/20/2014 1238   AST 20 02/10/2015 1109   AST 23 08/20/2014 1238   ALT 24 02/10/2015 1109   ALT 24 08/20/2014 1238   ALKPHOS 80 02/10/2015 1109   ALKPHOS 54 08/20/2014 1238   BILITOT 0.57 02/10/2015 1109   BILITOT 0.6 08/20/2014 1238   GFRNONAA 59* 12/03/2014 0340   GFRAA 68* 12/03/2014 0340    INo results found for: SPEP, UPEP  Lab Results  Component Value Date   WBC 16.8* 02/10/2015   NEUTROABS 15.1* 02/10/2015   HGB 12.5 02/10/2015   HCT 38.0 02/10/2015   MCV 87.4 02/10/2015   PLT 311 02/10/2015      Chemistry      Component Value Date/Time   NA 141 02/10/2015 1109   NA 144 12/03/2014 0340   K 3.7 02/10/2015 1109   K 4.0 12/03/2014 0340   CL 108 12/03/2014 0340   CO2 22 02/10/2015 1109   CO2 24 12/03/2014 0340   BUN 15.0 02/10/2015 1109   BUN 13 12/03/2014 0340   CREATININE 1.1 02/10/2015 1109   CREATININE 0.98 12/03/2014 0340   CREATININE 1.04 08/20/2014 1238      Component Value Date/Time   CALCIUM 9.0 02/10/2015 1109   CALCIUM 8.9 12/03/2014 0340   ALKPHOS 80 02/10/2015 1109   ALKPHOS 54 08/20/2014 1238   AST 20  02/10/2015 1109   AST 23 08/20/2014 1238   ALT 24 02/10/2015 1109   ALT 24 08/20/2014 1238   BILITOT 0.57 02/10/2015 1109   BILITOT 0.6 08/20/2014 1238       No results found for: LABCA2  No components found for: LABCA125  No results for input(s): INR in the last 168 hours.  Urinalysis    Component Value Date/Time   COLORURINE YELLOW 03/09/2011 0857   APPEARANCEUR CLOUDY* 03/09/2011 0857   LABSPEC 1.022 03/09/2011 0857   PHURINE 5.5 03/09/2011 0857   GLUCOSEU NEGATIVE 03/09/2011 0857   HGBUR NEGATIVE 03/09/2011 0857   BILIRUBINUR SMALL* 03/09/2011 0857   KETONESUR NEGATIVE 03/09/2011 0857   PROTEINUR NEGATIVE 03/09/2011 0857   UROBILINOGEN 0.2 03/09/2011 0857   NITRITE NEGATIVE 03/09/2011 0857   LEUKOCYTESUR LARGE* 03/09/2011 0857    STUDIES: No results found.  ASSESSMENT: 64 y.o.  [place] woman status post right breast lower inner quadrant biopsy 09/24/2014 for a clinical T1a N0, stage IA invasive ductal carcinoma, grade 1 or 2, estrogen and progesterone receptor negative, with an MIB-1 of 41%, and HER-2 negative   (1) status post right lumpectomy and axillary sentinel lymph node sampling 10/17/2014 for a pT1b pN0, stage  IA  invasive ductal carcinoma, grade 3, repeat HER-2 again negative.   (2) adjuvant chemotherapy to consist of cyclophosphamide and docetaxel given every 3 weeks 4 with Neulasta support   (3) adjuvant radiation to follow chemotherapy   (4) status post partial right colectomy with lymphadenectomy 03/30/2012 for a 2.7 cm tubular adenoma with high-grade dysplasia, no evidence of invasion, 0 of 11 lymph nodes involved ([id number])  (5) genetics testing sent 10/04/2014 through the [facility] gene panel /[facility] found no deleterious mutations in ATM, BARD1, BRCA1, BRCA2, BRIP1, CDH1, CHEK2, EPCAM, MLH1, MRE11A, MSH2, MSH6, MUTYH, NBN, NF1, PALB2, PMS2, PTEN, RAD50, RAD51C, RAD51D, SMARCA4, STK11, or TP53  (a) two variants of uncertain significance  were found    (i) ATM, p.[id number]   (ii) SMARCA4, p.[id number]  (6) left IJ DVT 12/02/14, started on xarelto  (7) elevated blood glucose. A1c on 12/03/14 was 6.4.  PLAN: Besides fatigue, Jordan [name] is managing treatment well. She is not anemic at this tie. The labs were reviewed in detail and her bili has dropped by half back to normal at 0.57. She will proceed with her 4th and final cycle of cyclophosphamide and docetaxel as planned today.   Jordan [name] will return in 1 week for labs and a nadir visit. She understands and agrees with this plan. She knows the goal of treatment in her case is cure. She has been encouraged to call with any issues that might arise before her next visit here.  Laurie Panda, NP   02/10/2015 12:15 PM

## 2015-02-11 ENCOUNTER — Other Ambulatory Visit: Payer: Self-pay | Admitting: General Surgery

## 2015-02-18 ENCOUNTER — Ambulatory Visit (HOSPITAL_BASED_OUTPATIENT_CLINIC_OR_DEPARTMENT_OTHER): Payer: Medicare PPO | Admitting: Nurse Practitioner

## 2015-02-18 ENCOUNTER — Encounter: Payer: Self-pay | Admitting: *Deleted

## 2015-02-18 ENCOUNTER — Encounter: Payer: Self-pay | Admitting: Nurse Practitioner

## 2015-02-18 ENCOUNTER — Other Ambulatory Visit (HOSPITAL_BASED_OUTPATIENT_CLINIC_OR_DEPARTMENT_OTHER): Payer: Medicare PPO

## 2015-02-18 VITALS — BP 100/79 | HR 106 | Temp 98.8°F | Resp 18 | Ht 61.0 in | Wt 217.2 lb

## 2015-02-18 DIAGNOSIS — C50311 Malignant neoplasm of lower-inner quadrant of right female breast: Secondary | ICD-10-CM | POA: Diagnosis not present

## 2015-02-18 DIAGNOSIS — E876 Hypokalemia: Secondary | ICD-10-CM

## 2015-02-18 LAB — COMPREHENSIVE METABOLIC PANEL (CC13)
ALBUMIN: 3.4 g/dL — AB (ref 3.5–5.0)
ALT: 26 U/L (ref 0–55)
ANION GAP: 12 meq/L — AB (ref 3–11)
AST: 22 U/L (ref 5–34)
Alkaline Phosphatase: 101 U/L (ref 40–150)
BILIRUBIN TOTAL: 0.77 mg/dL (ref 0.20–1.20)
BUN: 14.7 mg/dL (ref 7.0–26.0)
CALCIUM: 9.6 mg/dL (ref 8.4–10.4)
CO2: 23 mEq/L (ref 22–29)
Chloride: 106 mEq/L (ref 98–109)
Creatinine: 1.2 mg/dL — ABNORMAL HIGH (ref 0.6–1.1)
EGFR: 57 mL/min/{1.73_m2} — AB (ref >90)
GLUCOSE: 126 mg/dL (ref 70–140)
Potassium: 3.2 mEq/L — ABNORMAL LOW (ref 3.5–5.1)
SODIUM: 141 meq/L (ref 136–145)
TOTAL PROTEIN: 6.7 g/dL (ref 6.4–8.3)

## 2015-02-18 LAB — CBC WITH DIFFERENTIAL/PLATELET
BASO%: 1 % (ref 0.0–2.0)
Basophils Absolute: 0.2 10*3/uL — ABNORMAL HIGH (ref 0.0–0.1)
EOS ABS: 0.1 10*3/uL (ref 0.0–0.5)
EOS%: 0.6 % (ref 0.0–7.0)
HCT: 38.4 % (ref 34.8–46.6)
HGB: 12.9 g/dL (ref 11.6–15.9)
LYMPH%: 8.8 % — AB (ref 14.0–49.7)
MCH: 29.3 pg (ref 25.1–34.0)
MCHC: 33.6 g/dL (ref 31.5–36.0)
MCV: 87.1 fL (ref 79.5–101.0)
MONO#: 2.5 10*3/uL — ABNORMAL HIGH (ref 0.1–0.9)
MONO%: 14.3 % — AB (ref 0.0–14.0)
NEUT%: 75.3 % (ref 38.4–76.8)
NEUTROS ABS: 13 10*3/uL — AB (ref 1.5–6.5)
NRBC: 1 % — AB (ref 0–0)
PLATELETS: 258 10*3/uL (ref 145–400)
RBC: 4.41 10*6/uL (ref 3.70–5.45)
RDW: 17.8 % — ABNORMAL HIGH (ref 11.2–14.5)
WBC: 17.2 10*3/uL — ABNORMAL HIGH (ref 3.9–10.3)
lymph#: 1.5 10*3/uL (ref 0.9–3.3)

## 2015-02-18 NOTE — Progress Notes (Signed)
[place]  [phone] [phone]) [phone]     ID: Jordan [name] DOB: 1951/08/09  MR#: [id number]  VEH#:[id number]  Patient Care Team: Harrison Mons, PA-C as PCP - General (Physician Assistant) [name], MD as Consulting Physician (General Surgery) Chauncey Cruel, MD as Consulting Physician (Oncology) Arloa Koh, MD as Consulting Physician (Radiation Oncology) Mauro Kaufmann, RN as Registered Nurse Rockwell Germany, RN as Registered Nurse Holley Bouche, NP as Nurse Practitioner (Nurse Practitioner) PCP: [name] OTHER MD: [name] MD  CHIEF COMPLAINT: Early stage triple negative breast cancer  CURRENT TREATMENT: adjuvant chemotherapy  BREAST CANCER HISTORY: From the original intake note:  "Jordan [name]" [facility] 2015 showing breast density category B. A possible mass in the right breast was noted and on Jerry 20 03/05/2015 the patient underwent right diagnostic mammography with right ultrasonography. Spot compression views confirmed a slightly irregular mass in the lower inner quadrant of the right breast. This was not palpable by physical exam. Ultrasound showed a small hypoechoic mass measuring 5 mm in the area in question. Ultrasound of the right axilla was unremarkable.  Biopsy of the mass in question fibrin 05/06/2015 showed ([id number]) and invasive ductal carcinoma, grade 2, estrogen and progesterone receptor negative, with an MIB-1 of 41%, and no HER-2 amplification.  Bilateral breast MRI was obtained 10/04/2014. Results are pending.  The patient's subsequent history is as detailed below   INTERVAL HISTORY:  Jordan [name] returns today for follow up of her breast cancer. Today is day 1, cycle 4 of cyclophosphamide and docetaxel given every 3 weeks 4 with Neulasta support.  REVIEW OF SYSTEMS: Jordan [name] denies fevers, chills, nausea, or vomiting. She has had "mushy" stools  daily, but no true diarrhea. Her appetite is down and her energy is low. She denies mouth sores, rashes, or peripheral neuropathy symptoms. She has shortness of breath with exertion, but denies chest pain, cough, or palpitations. She has no headaches, dizziness, or vision changes. She denies anxiety but does have some depression related to family stressors. Her marriage the source of most of this. A detailed review of systems is otherwise stable.  PAST MEDICAL HISTORY: Past Medical History  Diagnosis Date  . Hyperlipidemia     takes Zetia and Zocor daily  . Thyroid disease   . Nasal congestion   . Leg swelling   . Constipation   . Nausea   . Generalized headaches     due to allergies, sinus  . PONV (postoperative nausea and vomiting)     pt states she is very easy to sedate  . Hypertension     takes Maxzide and Metoprolol daily  . Pneumonia     walking about 6-53yr ago  . History of bronchitis     last time about 6-789yrago  . Hx of seasonal allergies     takes OTC allergy med nightly  . History of migraine     last one about 66+yrs ago  . Dizziness     r/t side effects from meds  . Vertigo     takes Meclizine prn  . Joint pain   . Back pain     buldging disc  . GERD (gastroesophageal reflux disease)     takes Protonix as needed  . Hemorrhoids   . History of colon polyps   . Mass of colon   . Cancer     colon  . History of UTI   .  Hypothyroidism     takes Synthroid daily  . Depression     takes Wellbutrin daily  . Wears glasses   . Breast cancer 10/17/2014    lower inner quadrant of the right breast    PAST SURGICAL HISTORY: Past Surgical History  Procedure Laterality Date  . Cesarean section  1968, 1970  . Knee surgery  1998    right - arthroscopic  . Abdominal hysterectomy    . Exploratory laparotomy    . Carpal tunnel release    . Colonoscopy    . Esophagogastroduodenoscopy    . Partial colectomy  03/30/2012    Procedure: PARTIAL COLECTOMY;  Surgeon:  [name], MD;  Location: [facility];  Service: General;  Laterality: N/A;  . Appendectomy    . Radioactive seed guided mastectomy with axillary sentinel lymph node biopsy Right 10/17/2014    Procedure: RADIOACTIVE SEED GUIDED PARTIAL MASTECTOMY WITH AXILLARY SENTINEL LYMPH NODE BIOPSY;  Surgeon: [name], MD;  Location: [facility];  Service: General;  Laterality: Right;    FAMILY HISTORY Family History  Problem Relation Age of Onset  . Cancer Father 48    prostate  . Cancer Brother 43    colon  . Deep vein thrombosis Brother   . Cancer Maternal Aunt 3    breast  . Cancer Maternal Grandmother 37    breast  . Cancer Paternal Grandmother     colon  . Pulmonary embolism Brother     long-distance truck driver (PE x 2)  . Cancer Brother 9    colon  . Diabetes Maternal Grandfather   . Cancer Maternal Aunt 75    unknown type  . Mental illness Sister     history of Depression  . Seizures Sister 8    s/p traumatic brain injury  . Cancer Maternal Uncle     prostate   the patient's parents are still living, as of February 2016. Her father is 38 years old, with a history of prostate cancer diagnosed in his late 55s. The patient's mother is 8 years old. The patient had 5 brothers, 3 sisters. One brother died with prostate cancer at age 69. Another brother has a history of prostate cancer, age 56. One brother had colon cancer diagnosed age 47 as did a paternal grandmother. A maternal grandmother had breast cancer at age 73 as did a maternal aunt (diagnosed age 73.) A cousin was diagnosed with breast cancer the age of 62.  GYNECOLOGIC HISTORY:  No LMP recorded. Patient has had a hysterectomy. Menarche age 1, first live birth age 56. The patient is GX P1. She stopped having periods in 1981 when she underwent hysterectomy and unilateral salpingo-oophorectomy.. She did not take hormone replacement.  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 Her husband [name]  owns a business that [employer] at night. The patient's biological son [name] works in Biomedical scientist in [place]. He has 2 children. One of them, the patient's granddaughter [name], 26 years old, lives with the patient, and is a Interior and spatial designer at [employer]. The patient also has an adopted son, [name], lives in [place]. Jordan [name] attends a city of refugee church in [place] where she grew up    ADVANCED DIRECTIVES: Not in place  HEALTH MAINTENANCE: History  Substance Use Topics  . Smoking status: Never Smoker   . Smokeless tobacco: Never Used  . Alcohol Use: No     Colonoscopy: 03/30/2013/[facility]  PAP:  Bone density:  Lipid panel:  No Known Allergies  Current Outpatient Prescriptions  Medication Sig Dispense Refill  . buPROPion (WELLBUTRIN XL) 150 MG 24 hr tablet Take 1 tablet (150 mg total) by mouth daily. 30 tablet 5  . ezetimibe (ZETIA) 10 MG tablet Take 1 tablet (10 mg total) by mouth daily. 30 tablet 5  . levothyroxine (SYNTHROID, LEVOTHROID) 125 MCG tablet TAKE 1 TABLET BY MOUTH DAILY 90 tablet 1  . meloxicam (MOBIC) 15 MG tablet Take 1 tablet (15 mg total) by mouth daily. 30 tablet 0  . metoprolol succinate (TOPROL-XL) 25 MG 24 hr tablet Take 1 tablet (25 mg total) by mouth daily. 30 tablet 5  . Prenatal Multivit-Min-Fe-FA (PRENATAL VITAMINS PO) Take by mouth.    . rivaroxaban (XARELTO) 20 MG TABS tablet Take 1 tablet (20 mg total) by mouth daily with supper. (Patient taking differently: Take 20 mg by mouth daily. ) 30 tablet 3  . simvastatin (ZOCOR) 40 MG tablet Take 1 tablet (40 mg total) by mouth at bedtime. 30 tablet 5  . triamterene-hydrochlorothiazide (MAXZIDE-25) 37.5-25 MG per tablet TAKE 1 TABLET DAILY (Patient taking differently: 1 tablet. TAKE 1 TABLET DAILY) 30 tablet 12  . lidocaine-prilocaine (EMLA) cream Apply to affected area once (Patient not taking: Reported on 02/18/2015) 30 g 3  . LORazepam (ATIVAN) 0.5 MG tablet Take 1 tablet (0.5 mg  total) by mouth at bedtime as needed (Nausea or vomiting). (Patient not taking: Reported on 02/18/2015) 30 tablet 0  . meclizine (ANTIVERT) 25 MG tablet Take 1 tablet (25 mg total) by mouth 4 (four) times daily as needed for dizziness. (Patient not taking: Reported on 02/10/2015) 30 tablet 1  . nitroGLYCERIN (NITROSTAT) 0.4 MG SL tablet Place 1 tablet (0.4 mg total) under the tongue every 5 (five) minutes as needed for chest pain. (Patient not taking: Reported on 02/10/2015) 30 tablet 1   No current facility-administered medications for this visit.   Facility-Administered Medications Ordered in Other Visits  Medication Dose Route Frequency Provider Last Rate Last Dose  . chlorhexidine (HIBICLENS) 4 % liquid 1 application  1 application Topical Once [name], MD      . chlorhexidine (HIBICLENS) 4 % liquid 1 application  1 application Topical Once [name], MD        OBJECTIVE: Middle-aged African-American woman who appears stated age 2 Vitals:   02/18/15 1454  BP: 100/79  Pulse: 106  Temp: 98.8 F (37.1 C)  Resp: 18     Body mass index is 41.06 kg/(m^2).    ECOG FS:1 - Symptomatic but completely ambulatory  Skin: warm, dry  HEENT: sclerae anicteric, conjunctivae pink, oropharynx clear. No thrush or mucositis.  Lymph Nodes: No cervical or supraclavicular lymphadenopathy  Lungs: clear to auscultation bilaterally, no rales, wheezes, or rhonci  Heart: regular rate and rhythm  Abdomen: round, soft, non tender, positive bowel sounds  Musculoskeletal: No focal spinal tenderness, no peripheral edema  Neuro: non focal, well oriented, positive affect  Breasts: deferred  LAB RESULTS:  CMP     Component Value Date/Time   NA 141 02/18/2015 1429   NA 144 12/03/2014 0340   K 3.2* 02/18/2015 1429   K 4.0 12/03/2014 0340   CL 108 12/03/2014 0340   CO2 23 02/18/2015 1429   CO2 24 12/03/2014 0340   GLUCOSE 126 02/18/2015 1429   GLUCOSE 105* 12/03/2014 0340   BUN 14.7 02/18/2015  1429   BUN 13 12/03/2014 0340   CREATININE 1.2* 02/18/2015 1429  CREATININE 0.98 12/03/2014 0340   CREATININE 1.04 08/20/2014 1238   CALCIUM 9.6 02/18/2015 1429   CALCIUM 8.9 12/03/2014 0340   PROT 6.7 02/18/2015 1429   PROT 6.9 08/20/2014 1238   ALBUMIN 3.4* 02/18/2015 1429   ALBUMIN 3.9 08/20/2014 1238   AST 22 02/18/2015 1429   AST 23 08/20/2014 1238   ALT 26 02/18/2015 1429   ALT 24 08/20/2014 1238   ALKPHOS 101 02/18/2015 1429   ALKPHOS 54 08/20/2014 1238   BILITOT 0.77 02/18/2015 1429   BILITOT 0.6 08/20/2014 1238   GFRNONAA 59* 12/03/2014 0340   GFRAA 68* 12/03/2014 0340    INo results found for: SPEP, UPEP  Lab Results  Component Value Date   WBC 17.2* 02/18/2015   NEUTROABS 13.0* 02/18/2015   HGB 12.9 02/18/2015   HCT 38.4 02/18/2015   MCV 87.1 02/18/2015   PLT 258 02/18/2015      Chemistry      Component Value Date/Time   NA 141 02/18/2015 1429   NA 144 12/03/2014 0340   K 3.2* 02/18/2015 1429   K 4.0 12/03/2014 0340   CL 108 12/03/2014 0340   CO2 23 02/18/2015 1429   CO2 24 12/03/2014 0340   BUN 14.7 02/18/2015 1429   BUN 13 12/03/2014 0340   CREATININE 1.2* 02/18/2015 1429   CREATININE 0.98 12/03/2014 0340   CREATININE 1.04 08/20/2014 1238      Component Value Date/Time   CALCIUM 9.6 02/18/2015 1429   CALCIUM 8.9 12/03/2014 0340   ALKPHOS 101 02/18/2015 1429   ALKPHOS 54 08/20/2014 1238   AST 22 02/18/2015 1429   AST 23 08/20/2014 1238   ALT 26 02/18/2015 1429   ALT 24 08/20/2014 1238   BILITOT 0.77 02/18/2015 1429   BILITOT 0.6 08/20/2014 1238       No results found for: LABCA2  No components found for: LABCA125  No results for input(s): INR in the last 168 hours.  Urinalysis    Component Value Date/Time   COLORURINE YELLOW 03/09/2011 0857   APPEARANCEUR CLOUDY* 03/09/2011 0857   LABSPEC 1.022 03/09/2011 0857   PHURINE 5.5 03/09/2011 0857   GLUCOSEU NEGATIVE 03/09/2011 0857   HGBUR NEGATIVE 03/09/2011 0857   BILIRUBINUR  SMALL* 03/09/2011 0857   KETONESUR NEGATIVE 03/09/2011 0857   PROTEINUR NEGATIVE 03/09/2011 0857   UROBILINOGEN 0.2 03/09/2011 0857   NITRITE NEGATIVE 03/09/2011 0857   LEUKOCYTESUR LARGE* 03/09/2011 0857    STUDIES: No results found.  ASSESSMENT: 64 y.o.  [place] woman status post right breast lower inner quadrant biopsy 09/24/2014 for a clinical T1a N0, stage IA invasive ductal carcinoma, grade 1 or 2, estrogen and progesterone receptor negative, with an MIB-1 of 41%, and HER-2 negative   (1) status post right lumpectomy and axillary sentinel lymph node sampling 10/17/2014 for a pT1b pN0, stage IA  invasive ductal carcinoma, grade 3, repeat HER-2 again negative.   (2) adjuvant chemotherapy to consist of cyclophosphamide and docetaxel given every 3 weeks 4 with Neulasta support   (3) adjuvant radiation to follow chemotherapy   (4) status post partial right colectomy with lymphadenectomy 03/30/2012 for a 2.7 cm tubular adenoma with high-grade dysplasia, no evidence of invasion, 0 of 11 lymph nodes involved ([id number])  (5) genetics testing sent 10/04/2014 through the [facility] gene panel /[facility] found no deleterious mutations in ATM, BARD1, BRCA1, BRCA2, BRIP1, CDH1, CHEK2, EPCAM, MLH1, MRE11A, MSH2, MSH6, MUTYH, NBN, NF1, PALB2, PMS2, PTEN, RAD50, RAD51C, RAD51D, SMARCA4, STK11, or TP53  (a) two  variants of uncertain significance were found    (i) ATM, p.[id number]   (ii) SMARCA4, p.[id number]  (6) left IJ DVT 12/02/14, started on xarelto  (7) elevated blood glucose. A1c on 12/03/14 was 6.4.  PLAN: Jordan [name] is excited to have completed chemotherapy. The labs were reviewed in detail were relatively stable. Her potassium is down to 3.2, and she will add potassium rich foods to her diet.   Jordan [name] will have her radiation simulation with Dr. Valere Dross next week, and start soon afterward. She will meet with Dr. Jana Hakim in late August to discuss long term follow up. She understands and agrees  with this plan. She knows the goal of treatment in her case is cure. She has been encouraged to call with any issues that might arise before her next visit here.  Laurie Panda, NP   02/18/2015 4:32 PM

## 2015-02-21 ENCOUNTER — Telehealth: Payer: Self-pay | Admitting: *Deleted

## 2015-02-21 NOTE — Telephone Encounter (Signed)
Received call from patient about getting her port removed.  Informed her we would see when her XRT gets scheduled and let Dr. Ethlyn Gallery know when she will be finished with XRT.  Patient verbalized understanding.

## 2015-02-26 ENCOUNTER — Encounter: Payer: Self-pay | Admitting: Skilled Nursing Facility1

## 2015-02-26 NOTE — Progress Notes (Signed)
Subjective:     Patient ID: Jordan [name], female   DOB: 03-21-1951, 63 y.o.   MRN: [id number]  HPI   Review of Systems     Objective:   Physical Exam To assist the patient in identifying dietary strategies to gain some lost wt back.      Assessment:     Pt identified as being malnourished due to lost wt. Pt contacted via telephone via [phone]. Pt was unavailable.    Plan:     Dietitian left a message prompting the pt call Ernestene Kiel CSO, RD,LDN at [phone] at their earliest convenience.

## 2015-03-03 ENCOUNTER — Ambulatory Visit
Admission: RE | Admit: 2015-03-03 | Discharge: 2015-03-03 | Disposition: A | Discharge status: Home / Self Care | Payer: Medicare PPO | Source: Ambulatory Visit | Attending: Radiation Oncology | Admitting: Radiation Oncology

## 2015-03-03 ENCOUNTER — Telehealth: Payer: Self-pay | Admitting: Oncology

## 2015-03-03 DIAGNOSIS — Z171 Estrogen receptor negative status [ER-]: Secondary | ICD-10-CM | POA: Diagnosis not present

## 2015-03-03 DIAGNOSIS — C50311 Malignant neoplasm of lower-inner quadrant of right female breast: Secondary | ICD-10-CM | POA: Diagnosis not present

## 2015-03-03 DIAGNOSIS — Z510 Encounter for antineoplastic radiation therapy: Secondary | ICD-10-CM | POA: Diagnosis not present

## 2015-03-03 NOTE — Progress Notes (Signed)
Complex simulation/treatment planning note: The patient was taken to the CT simulator.  She was placed supine.  A Vac lock immobilization device was constructed on a custom breast board.  Her right breast was marked with radiopaque wires.  Her partial mastectomy scar was also marked in addition to her nipple.  She was then scanned.  An isocenter was chosen along the central breast.  The CT data set was sent to the planning system right contoured her tumor bed.  She was set up to medial and lateral right breast tangents.  2 unique MLCs were designed to conform the field.  I prescribing 4500 cGy in 25 sessions utilizing high energy photons.  This be followed by tumor bed boost for a further 1600 cGy in 8 sessions.  She is now ready for 3-D simulation.

## 2015-03-03 NOTE — Telephone Encounter (Signed)
Left message to confirm appointment for August.

## 2015-03-04 ENCOUNTER — Encounter (HOSPITAL_COMMUNITY): Payer: Self-pay

## 2015-03-04 ENCOUNTER — Encounter: Payer: Self-pay | Admitting: Physician Assistant

## 2015-03-04 ENCOUNTER — Ambulatory Visit (INDEPENDENT_AMBULATORY_CARE_PROVIDER_SITE_OTHER): Payer: Medicare PPO | Admitting: Physician Assistant

## 2015-03-04 VITALS — BP 142/77 | HR 88 | Temp 98.0°F | Resp 16 | Ht 61.5 in | Wt 227.0 lb

## 2015-03-04 DIAGNOSIS — F32A Depression, unspecified: Secondary | ICD-10-CM

## 2015-03-04 DIAGNOSIS — F329 Major depressive disorder, single episode, unspecified: Secondary | ICD-10-CM | POA: Diagnosis not present

## 2015-03-04 DIAGNOSIS — I10 Essential (primary) hypertension: Secondary | ICD-10-CM

## 2015-03-04 DIAGNOSIS — E785 Hyperlipidemia, unspecified: Secondary | ICD-10-CM

## 2015-03-04 DIAGNOSIS — E039 Hypothyroidism, unspecified: Secondary | ICD-10-CM | POA: Diagnosis not present

## 2015-03-04 MED ORDER — BUPROPION HCL ER (XL) 150 MG PO TB24: 150.0000 mg | ORAL_TABLET | Freq: Every day | ORAL | Status: DC

## 2015-03-04 MED ORDER — LEVOTHYROXINE SODIUM 125 MCG PO TABS: ORAL_TABLET | ORAL | Status: DC

## 2015-03-04 MED ORDER — METOPROLOL SUCCINATE ER 25 MG PO TB24: 25.0000 mg | ORAL_TABLET | Freq: Every day | ORAL | Status: DC

## 2015-03-04 MED ORDER — EZETIMIBE 10 MG PO TABS: 10.0000 mg | ORAL_TABLET | Freq: Every day | ORAL | Status: DC

## 2015-03-04 MED ORDER — SIMVASTATIN 40 MG PO TABS: 40.0000 mg | ORAL_TABLET | Freq: Every day | ORAL | Status: DC

## 2015-03-04 NOTE — Progress Notes (Signed)
Patient ID: Jordan [name], female    DOB: 02/25/1951, 64 y.o.   MRN: [id number]  PCP: Lockie Bothun, PA-C  Subjective:   Chief Complaint  Patient presents with  . Medication Refill    HPI Presents for medication refill.  Over all feels well. Starts radiation therapy for breast cancer soon. 30 treatments anticipated. She's got lots of determination, which makes up for inconsistent family support. She and her husband are basically roommates, and she's trying to decide if she's going to leave him. Her daughter (biologically, her granddaughter) is living with them, but nearly independent. Her sister has completed her school and is now taking on the care of their parents and doing some cooking for her. Her women's group is going strong now that she is able to cordinate things again. She's hoping to provide women with the confidence and skills to be independent in today's society.    Review of Systems  Constitutional: Positive for fatigue. Negative for activity change, appetite change and unexpected weight change.  HENT: Negative for congestion, dental problem, ear pain, hearing loss, mouth sores, postnasal drip, rhinorrhea, sneezing, sore throat, tinnitus and trouble swallowing.   Eyes: Negative for photophobia, pain, redness and visual disturbance.  Respiratory: Negative for cough, chest tightness and shortness of breath.   Cardiovascular: Negative for chest pain, palpitations and leg swelling.  Gastrointestinal: Negative for nausea, vomiting, abdominal pain, diarrhea, constipation and blood in stool.  Genitourinary: Negative for dysuria, urgency, frequency and hematuria.  Musculoskeletal: Negative for myalgias, arthralgias, gait problem and neck stiffness.  Skin: Negative for rash.       Hyperpigmentation under her nails, from chemotherapy. Hair loss consistent with chemotherapy.  Neurological: Negative for dizziness, speech difficulty, weakness, light-headedness, numbness and  headaches.  Hematological: Negative for adenopathy.  Psychiatric/Behavioral: Negative for confusion and sleep disturbance. The patient is not nervous/anxious.        Patient Active Problem List   Diagnosis Date Noted  . Constipation 12/16/2014  . Chemotherapy-induced nausea 12/16/2014  . Hyperglycemia 12/09/2014  . Internal jugular vein thrombosis   . Leukocytosis   . DVT (deep venous thrombosis) 12/02/2014  . Osteoarthritis of both feet 11/26/2014  . Poor venous access   . Colorectal cancer 10/03/2014  . Breast cancer of lower-inner quadrant of right female breast 09/27/2014  . Severe obesity (BMI >= 40) 11/06/2013  . Anemia, iron deficiency 04/25/2013  . GERD (gastroesophageal reflux disease) 02/12/2013  . HTN (hypertension) 10/21/2011  . Hypothyroidism 10/21/2011  . Depression 10/21/2011  . Hyperlipidemia 10/21/2011  . Lung nodules 10/21/2011  . Vertigo 10/21/2011  . Vitamin D deficiency 10/21/2011     Prior to Admission medications   Medication Sig Start Date End Date Taking? Authorizing Provider  buPROPion (WELLBUTRIN XL) 150 MG 24 hr tablet Take 1 tablet (150 mg total) by mouth daily. 09/11/14  Yes Ashle Stief, PA-C  ezetimibe (ZETIA) 10 MG tablet Take 1 tablet (10 mg total) by mouth daily. 08/20/14  Yes Kiev Labrosse, PA-C  levothyroxine (SYNTHROID, LEVOTHROID) 125 MCG tablet TAKE 1 TABLET BY MOUTH DAILY 09/11/14  Yes Esker Dever, PA-C  LORazepam (ATIVAN) 0.5 MG tablet Take 1 tablet (0.5 mg total) by mouth at bedtime as needed (Nausea or vomiting). 01/27/15  Yes Laurie Panda, NP  meclizine (ANTIVERT) 25 MG tablet Take 1 tablet (25 mg total) by mouth 4 (four) times daily as needed for dizziness. 08/20/14  Yes Dominico Rod, PA-C  meloxicam (MOBIC) 15 MG tablet Take 1 tablet (15 mg total) by  mouth daily. 12/30/14  Yes Laurie Panda, NP  metoprolol succinate (TOPROL-XL) 25 MG 24 hr tablet Take 1 tablet (25 mg total) by mouth daily. 08/20/14  Yes Toshua Honsinger,  PA-C  nitroGLYCERIN (NITROSTAT) 0.4 MG SL tablet Place 1 tablet (0.4 mg total) under the tongue every 5 (five) minutes as needed for chest pain. 11/23/12  Yes Graylyn Bunney, PA-C  Prenatal Multivit-Min-Fe-FA (PRENATAL VITAMINS PO) Take by mouth.   Yes Historical Provider, MD  rivaroxaban (XARELTO) 20 MG TABS tablet Take 1 tablet (20 mg total) by mouth daily with supper. Patient taking differently: Take 20 mg by mouth daily.  12/30/14  Yes Laurie Panda, NP  simvastatin (ZOCOR) 40 MG tablet Take 1 tablet (40 mg total) by mouth at bedtime. 08/20/14  Yes Antino Mayabb, PA-C  triamterene-hydrochlorothiazide (MAXZIDE-25) 37.5-25 MG per tablet TAKE 1 TABLET DAILY Patient taking differently: 1 tablet. TAKE 1 TABLET DAILY 08/20/14  Yes Halena Mohar, PA-C     No Known Allergies     Objective:  Physical Exam  Constitutional: She is oriented to person, place, and time. She appears well-developed and well-nourished. She is active and cooperative. No distress.  BP 142/77 mmHg  Pulse 88  Temp(Src) 98 F (36.7 C)  Resp 16  Ht 5' 1.5" (1.562 m)  Wt 227 lb (102.967 kg)  BMI 42.20 kg/m2   Eyes: Conjunctivae are normal. No scleral icterus.  Neck: Neck supple. No thyromegaly present.  Cardiovascular: Normal rate, regular rhythm, normal heart sounds and intact distal pulses.   Pulmonary/Chest: Effort normal and breath sounds normal.  Lymphadenopathy:    She has no cervical adenopathy.  Neurological: She is alert and oriented to person, place, and time.  Skin: Skin is warm and dry.  Psychiatric: She has a normal mood and affect. Her behavior is normal.           Assessment & Plan:   1. Hyperlipidemia - simvastatin (ZOCOR) 40 MG tablet; Take 1 tablet (40 mg total) by mouth at bedtime.  Dispense: 30 tablet; Refill: 5 - ezetimibe (ZETIA) 10 MG tablet; Take 1 tablet (10 mg total) by mouth daily.  Dispense: 30 tablet; Refill: 5  2. Essential hypertension Slightly above goal today, but given  her cancer treatment, we elect not to adjust her regimen at this time. - metoprolol succinate (TOPROL-XL) 25 MG 24 hr tablet; Take 1 tablet (25 mg total) by mouth daily.  Dispense: 30 tablet; Refill: 5  3. Hypothyroidism, unspecified hypothyroidism type - levothyroxine (SYNTHROID, LEVOTHROID) 125 MCG tablet; TAKE 1 TABLET BY MOUTH DAILY  Dispense: 30 tablet; Refill: 5  4. Depression Stable. Controlled. Continue current treatment. - buPROPion (WELLBUTRIN XL) 150 MG 24 hr tablet; Take 1 tablet (150 mg total) by mouth daily.  Dispense: 30 tablet; Refill: 5  We will hold off on labs today, given the frequent lab draws with oncology. I'll see her back in 6 months, sooner if needed.   Fara Chute, PA-C Physician Assistant-Certified [facility]

## 2015-03-04 NOTE — Patient Instructions (Signed)
Let me know when you need the refill of Xarelto.

## 2015-03-06 ENCOUNTER — Encounter: Payer: Self-pay | Admitting: Radiation Oncology

## 2015-03-06 DIAGNOSIS — C50311 Malignant neoplasm of lower-inner quadrant of right female breast: Secondary | ICD-10-CM | POA: Diagnosis not present

## 2015-03-06 DIAGNOSIS — Z171 Estrogen receptor negative status [ER-]: Secondary | ICD-10-CM | POA: Diagnosis not present

## 2015-03-06 DIAGNOSIS — Z510 Encounter for antineoplastic radiation therapy: Secondary | ICD-10-CM | POA: Diagnosis not present

## 2015-03-06 NOTE — Progress Notes (Signed)
3-D simulation note: The patient completed 3-D simulation for treatment to her right breast.  She was set up to medial and lateral right breast tangents with mixed 10 MV/6 MV photons along with electronic compensation.  4 unique custom treatment devices are employed to conform the field.  I prescribing 4500 cGy in 25 sessions utilizing mixed 6 MV/10 MV photons.  Dose volume histograms were obtained for the target structures including her tumor bed and avoidance structures including the lungs and heart.  We met our departmental guidelines.  The tumor bed will be boosted for 1600 cGy in 8 sessions following her tangential field treatment.

## 2015-03-08 DIAGNOSIS — C50311 Malignant neoplasm of lower-inner quadrant of right female breast: Secondary | ICD-10-CM | POA: Diagnosis not present

## 2015-03-08 DIAGNOSIS — Z510 Encounter for antineoplastic radiation therapy: Secondary | ICD-10-CM | POA: Diagnosis not present

## 2015-03-08 DIAGNOSIS — Z171 Estrogen receptor negative status [ER-]: Secondary | ICD-10-CM | POA: Diagnosis not present

## 2015-03-10 ENCOUNTER — Ambulatory Visit
Admission: RE | Admit: 2015-03-10 | Discharge: 2015-03-10 | Disposition: A | Discharge status: Home / Self Care | Payer: Medicare PPO | Source: Ambulatory Visit | Attending: Radiation Oncology | Admitting: Radiation Oncology

## 2015-03-10 DIAGNOSIS — Z171 Estrogen receptor negative status [ER-]: Secondary | ICD-10-CM | POA: Diagnosis not present

## 2015-03-10 DIAGNOSIS — Z510 Encounter for antineoplastic radiation therapy: Secondary | ICD-10-CM | POA: Diagnosis not present

## 2015-03-10 DIAGNOSIS — C50311 Malignant neoplasm of lower-inner quadrant of right female breast: Secondary | ICD-10-CM | POA: Diagnosis not present

## 2015-03-11 ENCOUNTER — Other Ambulatory Visit: Payer: Self-pay | Admitting: Physician Assistant

## 2015-03-11 ENCOUNTER — Ambulatory Visit
Admission: RE | Admit: 2015-03-11 | Discharge: 2015-03-11 | Disposition: A | Discharge status: Home / Self Care | Payer: Medicare PPO | Source: Ambulatory Visit | Attending: Radiation Oncology | Admitting: Radiation Oncology

## 2015-03-11 DIAGNOSIS — Z171 Estrogen receptor negative status [ER-]: Secondary | ICD-10-CM | POA: Diagnosis not present

## 2015-03-11 DIAGNOSIS — C50311 Malignant neoplasm of lower-inner quadrant of right female breast: Secondary | ICD-10-CM | POA: Diagnosis not present

## 2015-03-11 DIAGNOSIS — Z510 Encounter for antineoplastic radiation therapy: Secondary | ICD-10-CM | POA: Diagnosis not present

## 2015-03-12 ENCOUNTER — Encounter: Payer: Self-pay | Admitting: Radiation Oncology

## 2015-03-12 ENCOUNTER — Ambulatory Visit
Admission: RE | Admit: 2015-03-12 | Discharge: 2015-03-12 | Disposition: A | Discharge status: Home / Self Care | Payer: Medicare PPO | Source: Ambulatory Visit | Attending: Radiation Oncology | Admitting: Radiation Oncology

## 2015-03-12 DIAGNOSIS — C50311 Malignant neoplasm of lower-inner quadrant of right female breast: Secondary | ICD-10-CM | POA: Diagnosis not present

## 2015-03-12 DIAGNOSIS — Z510 Encounter for antineoplastic radiation therapy: Secondary | ICD-10-CM | POA: Diagnosis not present

## 2015-03-12 DIAGNOSIS — Z171 Estrogen receptor negative status [ER-]: Secondary | ICD-10-CM | POA: Diagnosis not present

## 2015-03-12 NOTE — Progress Notes (Signed)
Weekly Management Note:  Site: Right Breast Current Dose:  360  cGy Projected Dose: 4500  cGy  Narrative: The patient is reviewed today for routine under treatment assessment. CBCT/MVCT images/port films were reviewed. The chart was reviewed.   She is without complaints today. She began her XRT yesterday. She is await patient education for skin care. She denies any skin changes.   Physical Examination: There were no vitals filed for this visit..  Weight:  . Not examined today.  Impression: Tolerating radiation therapy well.  Plan: Continue radiation therapy as planned.

## 2015-03-13 ENCOUNTER — Other Ambulatory Visit: Payer: Self-pay | Admitting: Oncology

## 2015-03-13 ENCOUNTER — Ambulatory Visit
Admission: RE | Admit: 2015-03-13 | Discharge: 2015-03-13 | Disposition: A | Discharge status: Home / Self Care | Payer: Medicare PPO | Source: Ambulatory Visit | Attending: Radiation Oncology | Admitting: Radiation Oncology

## 2015-03-13 DIAGNOSIS — C50311 Malignant neoplasm of lower-inner quadrant of right female breast: Secondary | ICD-10-CM | POA: Diagnosis not present

## 2015-03-13 DIAGNOSIS — Z510 Encounter for antineoplastic radiation therapy: Secondary | ICD-10-CM | POA: Diagnosis not present

## 2015-03-13 DIAGNOSIS — Z171 Estrogen receptor negative status [ER-]: Secondary | ICD-10-CM | POA: Diagnosis not present

## 2015-03-13 MED ORDER — LORAZEPAM 0.5 MG PO TABS: 0.5000 mg | ORAL_TABLET | Freq: Every evening | ORAL | Status: DC | PRN

## 2015-03-14 ENCOUNTER — Ambulatory Visit
Admission: RE | Admit: 2015-03-14 | Discharge: 2015-03-14 | Disposition: A | Discharge status: Home / Self Care | Payer: Medicare PPO | Source: Ambulatory Visit | Attending: Radiation Oncology | Admitting: Radiation Oncology

## 2015-03-14 DIAGNOSIS — C50311 Malignant neoplasm of lower-inner quadrant of right female breast: Secondary | ICD-10-CM | POA: Diagnosis not present

## 2015-03-14 DIAGNOSIS — Z510 Encounter for antineoplastic radiation therapy: Secondary | ICD-10-CM | POA: Diagnosis not present

## 2015-03-14 DIAGNOSIS — Z171 Estrogen receptor negative status [ER-]: Secondary | ICD-10-CM | POA: Diagnosis not present

## 2015-03-17 ENCOUNTER — Ambulatory Visit
Admission: RE | Admit: 2015-03-17 | Discharge: 2015-03-17 | Disposition: A | Discharge status: Home / Self Care | Payer: Medicare PPO | Source: Ambulatory Visit | Attending: Radiation Oncology | Admitting: Radiation Oncology

## 2015-03-17 DIAGNOSIS — Z510 Encounter for antineoplastic radiation therapy: Secondary | ICD-10-CM | POA: Diagnosis not present

## 2015-03-17 DIAGNOSIS — Z171 Estrogen receptor negative status [ER-]: Secondary | ICD-10-CM | POA: Diagnosis not present

## 2015-03-17 DIAGNOSIS — C50311 Malignant neoplasm of lower-inner quadrant of right female breast: Secondary | ICD-10-CM | POA: Diagnosis not present

## 2015-03-18 ENCOUNTER — Ambulatory Visit
Admission: RE | Admit: 2015-03-18 | Discharge: 2015-03-18 | Disposition: A | Discharge status: Home / Self Care | Payer: Medicare PPO | Source: Ambulatory Visit | Attending: Radiation Oncology | Admitting: Radiation Oncology

## 2015-03-18 ENCOUNTER — Ambulatory Visit: Payer: Medicare PPO

## 2015-03-18 ENCOUNTER — Encounter: Payer: Self-pay | Admitting: Radiation Oncology

## 2015-03-18 VITALS — BP 88/60 | HR 66 | Temp 98.4°F | Resp 16 | Wt 226.1 lb

## 2015-03-18 DIAGNOSIS — Z510 Encounter for antineoplastic radiation therapy: Secondary | ICD-10-CM | POA: Diagnosis not present

## 2015-03-18 DIAGNOSIS — Z171 Estrogen receptor negative status [ER-]: Secondary | ICD-10-CM | POA: Diagnosis not present

## 2015-03-18 DIAGNOSIS — C50311 Malignant neoplasm of lower-inner quadrant of right female breast: Secondary | ICD-10-CM | POA: Diagnosis not present

## 2015-03-18 MED ORDER — RADIAPLEXRX EX GEL
Freq: Once | CUTANEOUS | Status: AC
Start: 2015-03-18 — End: 2015-03-18
  Administered 2015-03-18: 15:00:00 via TOPICAL

## 2015-03-18 MED ORDER — ALRA NON-METALLIC DEODORANT (RAD-ONC)
1.0000 "application " | Freq: Once | TOPICAL | Status: AC
  Administered 2015-03-18: 1 via TOPICAL

## 2015-03-18 NOTE — Addendum Note (Signed)
Encounter addended by: Benn Moulder, RN on: 03/18/2015  5:14 PM<BR>     Documentation filed: Demographics Visit, Inpatient [facility]

## 2015-03-18 NOTE — Progress Notes (Signed)
Weekly rad txs 6/33 completed  Right breast , slight erythema, pt education done, ,gave book, alra , radiaplex gel, discussed fatiogue,skin irritation,swelling,tenderness, pain, verbal understanding, teach back, no c/o pain Increase protein in diet and drink plenty fluids,water 1:47 PM BP 88/60 mmHg  Pulse 66  Temp(Src) 98.4 F (36.9 C) (Oral)  Resp 16  Wt 226 lb 1.6 oz (102.558 kg)  Wt Readings from Last 3 Encounters:  03/18/15 226 lb 1.6 oz (102.558 kg)  03/04/15 227 lb (102.967 kg)  02/18/15 217 lb 3.2 oz (98.521 kg)

## 2015-03-18 NOTE — Addendum Note (Signed)
Encounter addended by: Benn Moulder, RN on: 03/18/2015  5:11 PM<BR>     Documentation filed: Inpatient Patient Education, Orders, Dx Association

## 2015-03-18 NOTE — Progress Notes (Signed)
CC: Harrison Mons, PA-C  Weekly Management Note:  Site: Right breast Current Dose:  1080  cGy Projected Dose: 4500  cGy  Narrative: The patient is seen today for routine under treatment assessment. CBCT/MVCT images/port films were reviewed. The chart was reviewed.   She is without complaints today except for a recent fall, falling on her right side.  Soon after her fall she developed left wrist discomfort and swelling along the left forearm/hand.  She is not sure if she fell on her left wrist/hand.  Physical Examination: .  Weight: 226 lb 1.6 oz (102.558 kg).  There is faint hyperpigmentation the skin along the left breast.  On inspection of the left upper extremity there is edema of the left forearm and hand.  She does have slight discomfort on palpation of the central left wrist.  She does appear to have left arm superficial phlebitis from chemotherapy.  Impression: Tolerating radiation therapy well, although she does have a history of trauma to her left wrist with pain and edema.  Of note is that her treatment and surgery are on the right side.  I doubt that she has thrombosis of her left-sided Port-A-Cath, but this could explain her distal left upper extremity edema.  However, she states that the appearance of her left arm is essentially unchanged.  I instructed her to see her primary care physician office.  Plan: Continue radiation therapy as planned.

## 2015-03-19 ENCOUNTER — Ambulatory Visit
Admission: RE | Admit: 2015-03-19 | Discharge: 2015-03-19 | Disposition: A | Discharge status: Home / Self Care | Payer: Medicare PPO | Source: Ambulatory Visit | Attending: Radiation Oncology | Admitting: Radiation Oncology

## 2015-03-19 DIAGNOSIS — Z510 Encounter for antineoplastic radiation therapy: Secondary | ICD-10-CM | POA: Diagnosis not present

## 2015-03-19 DIAGNOSIS — C50311 Malignant neoplasm of lower-inner quadrant of right female breast: Secondary | ICD-10-CM | POA: Diagnosis not present

## 2015-03-19 DIAGNOSIS — Z171 Estrogen receptor negative status [ER-]: Secondary | ICD-10-CM | POA: Diagnosis not present

## 2015-03-20 ENCOUNTER — Encounter: Payer: Self-pay | Admitting: Radiation Oncology

## 2015-03-20 ENCOUNTER — Ambulatory Visit
Admission: RE | Admit: 2015-03-20 | Discharge: 2015-03-20 | Disposition: A | Discharge status: Home / Self Care | Payer: Medicare PPO | Source: Ambulatory Visit | Attending: Radiation Oncology | Admitting: Radiation Oncology

## 2015-03-20 DIAGNOSIS — C50311 Malignant neoplasm of lower-inner quadrant of right female breast: Secondary | ICD-10-CM | POA: Diagnosis not present

## 2015-03-20 DIAGNOSIS — Z171 Estrogen receptor negative status [ER-]: Secondary | ICD-10-CM | POA: Diagnosis not present

## 2015-03-20 DIAGNOSIS — Z510 Encounter for antineoplastic radiation therapy: Secondary | ICD-10-CM | POA: Diagnosis not present

## 2015-03-20 NOTE — Progress Notes (Signed)
Electron beam complex simulation note: The patient underwent virtual simulation for her right breast boost with electrons.  She was set up en face.  One custom block is constructed to conform the field.  A Monte Carlo calculation and plan are performed.  I am prescribing 1600 cGy in 8 sessions utilizing 12 MEV electrons.  A special port plan is requested.

## 2015-03-21 ENCOUNTER — Ambulatory Visit
Admission: RE | Admit: 2015-03-21 | Discharge: 2015-03-21 | Disposition: A | Discharge status: Home / Self Care | Payer: Medicare PPO | Source: Ambulatory Visit | Attending: Radiation Oncology | Admitting: Radiation Oncology

## 2015-03-21 DIAGNOSIS — C50311 Malignant neoplasm of lower-inner quadrant of right female breast: Secondary | ICD-10-CM | POA: Diagnosis not present

## 2015-03-21 DIAGNOSIS — Z171 Estrogen receptor negative status [ER-]: Secondary | ICD-10-CM | POA: Diagnosis not present

## 2015-03-21 DIAGNOSIS — Z510 Encounter for antineoplastic radiation therapy: Secondary | ICD-10-CM | POA: Diagnosis not present

## 2015-03-24 ENCOUNTER — Ambulatory Visit
Admission: RE | Admit: 2015-03-24 | Discharge: 2015-03-24 | Disposition: A | Discharge status: Home / Self Care | Payer: Medicare PPO | Source: Ambulatory Visit | Attending: Radiation Oncology | Admitting: Radiation Oncology

## 2015-03-24 VITALS — BP 104/67 | HR 81 | Temp 98.3°F | Wt 231.5 lb

## 2015-03-24 DIAGNOSIS — C50311 Malignant neoplasm of lower-inner quadrant of right female breast: Secondary | ICD-10-CM

## 2015-03-24 DIAGNOSIS — Z510 Encounter for antineoplastic radiation therapy: Secondary | ICD-10-CM | POA: Diagnosis not present

## 2015-03-24 DIAGNOSIS — Z171 Estrogen receptor negative status [ER-]: Secondary | ICD-10-CM | POA: Diagnosis not present

## 2015-03-24 NOTE — Progress Notes (Signed)
   Weekly Management Note:  outpatient    ICD-9-CM ICD-10-CM   1. Breast cancer of lower-inner quadrant of right female breast 174.3 C50.311     Current Dose:  18 Gy  Projected Dose: 61 Gy   Narrative:  The patient presents for routine under treatment assessment.  CBCT/MVCT images/Port film x-rays were reviewed.  The chart was checked. Weekly assessment of radiation to right breast.Completed 10 of 33 fractions.Mild tanning of skin.Greatest concern is left wrist pain post fall from bathtub 2 weeks ago.Has appointment 03/25/15 with primary care physician at 4:00 pm    Physical Findings:  weight is 231 lb 8 oz (105.008 kg). Her temperature is 98.3 F (36.8 C). Her blood pressure is 104/67 and her pulse is 81.   Wt Readings from Last 3 Encounters:  03/24/15 231 lb 8 oz (105.008 kg)  03/18/15 226 lb 1.6 oz (102.558 kg)  03/04/15 227 lb (102.967 kg)   Mild hyperpigmentation of right breast.  Impression:  The patient is tolerating radiotherapy.  Plan:  Continue radiotherapy as planned. Continue application of radiaplex.   ________________________________   Eppie Gibson, M.D.

## 2015-03-24 NOTE — Progress Notes (Signed)
Weekly assessment of radiation to right breast.Completed 10 of 33 fractions.Mild tanning of skin.Greatest concern is left wrist pain post fall from bathtub 2 weeks ago.Has appointment 03/25/15 with primary care physician at 4:11 pm.Continue application of radiaplex.

## 2015-03-25 ENCOUNTER — Ambulatory Visit
Admission: RE | Admit: 2015-03-25 | Discharge: 2015-03-25 | Disposition: A | Discharge status: Home / Self Care | Payer: Medicare PPO | Source: Ambulatory Visit | Attending: Radiation Oncology | Admitting: Radiation Oncology

## 2015-03-25 ENCOUNTER — Encounter: Payer: Self-pay | Admitting: Physician Assistant

## 2015-03-25 ENCOUNTER — Ambulatory Visit (INDEPENDENT_AMBULATORY_CARE_PROVIDER_SITE_OTHER): Payer: Medicare PPO | Admitting: Family Medicine

## 2015-03-25 ENCOUNTER — Ambulatory Visit (INDEPENDENT_AMBULATORY_CARE_PROVIDER_SITE_OTHER): Payer: Medicare PPO

## 2015-03-25 VITALS — BP 114/74 | HR 85 | Temp 98.7°F | Resp 16 | Wt 229.0 lb

## 2015-03-25 DIAGNOSIS — Z510 Encounter for antineoplastic radiation therapy: Secondary | ICD-10-CM | POA: Diagnosis not present

## 2015-03-25 DIAGNOSIS — M25532 Pain in left wrist: Secondary | ICD-10-CM

## 2015-03-25 DIAGNOSIS — M7989 Other specified soft tissue disorders: Secondary | ICD-10-CM | POA: Diagnosis not present

## 2015-03-25 DIAGNOSIS — I82C12 Acute embolism and thrombosis of left internal jugular vein: Secondary | ICD-10-CM

## 2015-03-25 DIAGNOSIS — Z171 Estrogen receptor negative status [ER-]: Secondary | ICD-10-CM | POA: Diagnosis not present

## 2015-03-25 DIAGNOSIS — C50311 Malignant neoplasm of lower-inner quadrant of right female breast: Secondary | ICD-10-CM

## 2015-03-25 DIAGNOSIS — M79642 Pain in left hand: Secondary | ICD-10-CM | POA: Diagnosis not present

## 2015-03-25 NOTE — Progress Notes (Signed)
[facility] [address] [place] [address] [phone]  Date:  03/25/2015   Name:  Jordan [name]   DOB:  1950/08/27   MRN:  [id number]  PCP:  JEFFERY,CHELLE, PA-C    Chief Complaint: Wrist Pain   History of Present Illness:  This is a 64 y.o. female with PMH breast cancer currently undergoing chemo and radiation who is presenting with left hand and wrist pain and swelling x 2.5 weeks. States 3 weeks ago she fell in the shower. Her hand and wrist hurt for a few hours and then resolved. 3 days later she developed swelling and pain in her hand/wrist/arm. She does not have decreased movement or paresthesias. She does have a history of left IJ thrombosis 11/2014. She had a left upper extremity venous duplex d/t left arm swelling which found the thrombosis. She is currently on xarelto 20 mg QD. She denies SOB, wheezing, palpitations. Pt is depressed about her cancer but looking forward to September 1st when treatment ends. She states she is very tired of coming to doctors offices.  Review of Systems:  Review of Systems See HPI  Patient Active Problem List   Diagnosis Date Noted  . Constipation 12/16/2014  . Chemotherapy-induced nausea 12/16/2014  . Hyperglycemia 12/09/2014  . Internal jugular vein thrombosis   . Leukocytosis   . DVT (deep venous thrombosis) 12/02/2014  . Osteoarthritis of both feet 11/26/2014  . Poor venous access   . Colorectal cancer 10/03/2014  . Breast cancer of lower-inner quadrant of right female breast 09/27/2014  . Severe obesity (BMI >= 40) 11/06/2013  . Anemia, iron deficiency 04/25/2013  . GERD (gastroesophageal reflux disease) 02/12/2013  . HTN (hypertension) 10/21/2011  . Hypothyroidism 10/21/2011  . Depression 10/21/2011  . Hyperlipidemia 10/21/2011  . Lung nodules 10/21/2011  . Vertigo 10/21/2011  . Vitamin D deficiency 10/21/2011    Prior to Admission medications   Medication Sig Start Date End Date Taking?  Authorizing Provider  buPROPion (WELLBUTRIN XL) 150 MG 24 hr tablet Take 1 tablet (150 mg total) by mouth daily. 03/04/15  Yes Chelle Jeffery, PA-C  ezetimibe (ZETIA) 10 MG tablet Take 1 tablet (10 mg total) by mouth daily. 03/04/15  Yes Chelle Jeffery, PA-C  ibuprofen (ADVIL,MOTRIN) 200 MG tablet Take 200 mg by mouth every 6 (six) hours as needed.   Yes Historical Provider, MD  levothyroxine (SYNTHROID, LEVOTHROID) 125 MCG tablet TAKE 1 TABLET BY MOUTH DAILY 03/04/15  Yes Chelle Jeffery, PA-C  loratadine (CLARITIN) 10 MG tablet Take 10 mg by mouth daily. 03/13/15  Yes Historical Provider, MD  LORazepam (ATIVAN) 0.5 MG tablet Take 1 tablet (0.5 mg total) by mouth at bedtime as needed for anxiety. 03/13/15  Yes Chauncey Cruel, MD  meclizine (ANTIVERT) 25 MG tablet Take 1 tablet (25 mg total) by mouth 4 (four) times daily as needed for dizziness. 08/20/14  Yes Chelle Jeffery, PA-C  meloxicam (MOBIC) 15 MG tablet Take 1 tablet (15 mg total) by mouth daily. 12/30/14  Yes Laurie Panda, NP  metoprolol succinate (TOPROL-XL) 25 MG 24 hr tablet Take 1 tablet (25 mg total) by mouth daily. 03/04/15  Yes Chelle Jeffery, PA-C  nitroGLYCERIN (NITROSTAT) 0.4 MG SL tablet Place 1 tablet (0.4 mg total) under the tongue every 5 (five) minutes as needed for chest pain. 11/23/12  Yes Chelle Jeffery, PA-C  Prenatal Multivit-Min-Fe-FA (PRENATAL VITAMINS PO) Take by mouth.   Yes Historical Provider, MD  rivaroxaban (XARELTO) 20 MG TABS tablet  Take 1 tablet (20 mg total) by mouth daily with supper. Patient taking differently: Take 20 mg by mouth daily.  12/30/14  Yes Laurie Panda, NP  simvastatin (ZOCOR) 40 MG tablet Take 1 tablet (40 mg total) by mouth at bedtime. 03/04/15  Yes Chelle Jeffery, PA-C  triamterene-hydrochlorothiazide (MAXZIDE-25) 37.5-25 MG per tablet TAKE 1 TABLET DAILY Patient taking differently: 1 tablet. TAKE 1 TABLET DAILY 08/20/14  Yes Harrison Mons, PA-C    Not on File  Past Surgical History   Procedure Laterality Date  . Cesarean section  1968, 1970  . Knee surgery  1998    right - arthroscopic  . Abdominal hysterectomy    . Exploratory laparotomy    . Carpal tunnel release    . Colonoscopy    . Esophagogastroduodenoscopy    . Partial colectomy  03/30/2012    Procedure: PARTIAL COLECTOMY;  Surgeon: [name], MD;  Location: [facility];  Service: General;  Laterality: N/A;  . Appendectomy    . Radioactive seed guided mastectomy with axillary sentinel lymph node biopsy Right 10/17/2014    Procedure: RADIOACTIVE SEED GUIDED PARTIAL MASTECTOMY WITH AXILLARY SENTINEL LYMPH NODE BIOPSY;  Surgeon: [name], MD;  Location: [facility];  Service: General;  Laterality: Right;    History  Substance Use Topics  . Smoking status: Never Smoker   . Smokeless tobacco: Never Used  . Alcohol Use: No    Family History  Problem Relation Age of Onset  . Cancer Father 98    prostate  . Cancer Brother 44    colon  . Deep vein thrombosis Brother   . Cancer Maternal Aunt 82    breast  . Cancer Maternal Grandmother 26    breast  . Cancer Paternal Grandmother     colon  . Pulmonary embolism Brother     long-distance truck driver (PE x 2)  . Cancer Brother 61    colon  . Diabetes Maternal Grandfather   . Cancer Maternal Aunt 75    unknown type  . Mental illness Sister     history of Depression  . Seizures Sister 8    s/p traumatic brain injury  . Cancer Maternal Uncle     prostate    Medication list has been reviewed and updated.  Physical Examination:  Physical Exam  Constitutional: She is oriented to person, place, and time. She appears well-developed and well-nourished. No distress.  HENT:  Head: Normocephalic and atraumatic.  Right Ear: Hearing normal.  Left Ear: Hearing normal.  Nose: Nose normal.  Eyes: Conjunctivae and lids are normal. Right eye exhibits no discharge. Left eye exhibits no discharge. No scleral icterus.  Cardiovascular: Normal  rate, regular rhythm, normal heart sounds and normal pulses.   No murmur heard. Pulmonary/Chest: Effort normal. No respiratory distress. She has no wheezes. She has no rhonchi. She has no rales.  Musculoskeletal: Normal range of motion.       Right shoulder: She exhibits normal range of motion.       Right wrist: Normal.       Left wrist: She exhibits tenderness (dorsal), bony tenderness (scaphoid) and swelling (1+ pitting edema to 2 inches above wrist). She exhibits normal range of motion.       Right hand: Normal.       Left hand: She exhibits tenderness (diffuse over dorsal hand), bony tenderness and swelling (1+ pitting edema over dorsal hand). She exhibits normal range of motion and normal capillary refill.  Normal sensation noted. Normal strength noted.  General tenderness over forearm and upper arm with squeezing, no swelling present  Neurological: She is alert and oriented to person, place, and time. No sensory deficit.  Skin: Skin is warm, dry and intact. No lesion and no rash noted.  Psychiatric: She has a normal mood and affect. Her speech is normal and behavior is normal. Thought content normal.   BP 114/74 mmHg  Pulse 85  Temp(Src) 98.7 F (37.1 C) (Oral)  Resp 16  Wt 229 lb (103.874 kg) [facility] reading (PRIMARY) by  Dr. Linna Darner: no acute abnormality  Assessment and Plan:  1. Left wrist pain 2. Pain of left hand 3. Left arm swelling 4. Internal jugular vein thrombosis, left 5. Breast cancer of lower-inner quadrant of right female breast Radiograph negative. Placed in thumb spica splint d/t scaphoid tenderness although scaphoid fracture should have shown up by now since injury 3 weeks ago. D/t swelling and pitting edema of hand, suspicious for DVT of arm or neck. Pt does have a history of internal jugular DVT dx'd 4 months ago and is on xarelto daily. She does remain at increased risk of DVT d/t cancer. Ordered STAT left UE venous doppler. If negative, will treat like sprain and  she will return in 1 week for f/u. - DG Hand Complete Left; Future - VAS [place] UPPER EXTREMITY VENOUS DUPLEX; Future    Benjaman Pott. Drenda Freeze, MHS [facility]  03/26/2015

## 2015-03-25 NOTE — Patient Instructions (Signed)
You will get a phone call to make appointment for ultrasound. Take advil 400 mg alternating with tylenol three times a day. Elevate arm, wear splint at all times and apply ice. If ultrasound negative, plan to follow up with me next Tuesday before 6 pm.

## 2015-03-26 ENCOUNTER — Other Ambulatory Visit: Payer: Self-pay | Admitting: Nurse Practitioner

## 2015-03-26 ENCOUNTER — Telehealth: Payer: Self-pay

## 2015-03-26 ENCOUNTER — Ambulatory Visit (HOSPITAL_COMMUNITY): Payer: Medicare PPO

## 2015-03-26 ENCOUNTER — Ambulatory Visit
Admission: RE | Admit: 2015-03-26 | Discharge: 2015-03-26 | Disposition: A | Discharge status: Home / Self Care | Payer: Medicare PPO | Source: Ambulatory Visit | Attending: Radiation Oncology | Admitting: Radiation Oncology

## 2015-03-26 DIAGNOSIS — Z171 Estrogen receptor negative status [ER-]: Secondary | ICD-10-CM | POA: Diagnosis not present

## 2015-03-26 DIAGNOSIS — C50311 Malignant neoplasm of lower-inner quadrant of right female breast: Secondary | ICD-10-CM | POA: Diagnosis not present

## 2015-03-26 DIAGNOSIS — Z510 Encounter for antineoplastic radiation therapy: Secondary | ICD-10-CM | POA: Diagnosis not present

## 2015-03-26 NOTE — Telephone Encounter (Signed)
Patient states that she missed a call from [place]. She does not know what it is about. Maybe an x-ray she had done on 03/25/2015.  [phone]

## 2015-03-26 NOTE — Telephone Encounter (Signed)
It was about doppler.  Appt was at 230.  Please call and reschedule doppler for 03/27/15

## 2015-03-26 NOTE — Progress Notes (Signed)
Discussed with Bennett Scrape, PA-C.  Patient fell, but now hurting more with swelling back of hand and wrist.  Xrays examined.  No pathology noted.  Treatment plan agreed on with rest, splint, ice if needed, return if symptoms persist.  Fenton Malling. Linna Darner MD

## 2015-03-27 ENCOUNTER — Ambulatory Visit
Admission: RE | Admit: 2015-03-27 | Discharge: 2015-03-27 | Disposition: A | Discharge status: Home / Self Care | Payer: Medicare PPO | Source: Ambulatory Visit | Attending: Radiation Oncology | Admitting: Radiation Oncology

## 2015-03-27 ENCOUNTER — Other Ambulatory Visit: Payer: Self-pay | Admitting: *Deleted

## 2015-03-27 DIAGNOSIS — C50311 Malignant neoplasm of lower-inner quadrant of right female breast: Secondary | ICD-10-CM | POA: Diagnosis not present

## 2015-03-27 DIAGNOSIS — M25532 Pain in left wrist: Secondary | ICD-10-CM

## 2015-03-27 DIAGNOSIS — Z510 Encounter for antineoplastic radiation therapy: Secondary | ICD-10-CM | POA: Diagnosis not present

## 2015-03-27 DIAGNOSIS — M79642 Pain in left hand: Secondary | ICD-10-CM

## 2015-03-27 DIAGNOSIS — Z171 Estrogen receptor negative status [ER-]: Secondary | ICD-10-CM | POA: Diagnosis not present

## 2015-03-27 DIAGNOSIS — M7989 Other specified soft tissue disorders: Secondary | ICD-10-CM

## 2015-03-27 NOTE — Telephone Encounter (Signed)
Pt notified and pt will be there on 8/12 at 3pm at [facility].

## 2015-03-27 NOTE — Telephone Encounter (Signed)
LMOM to CB on home number. VM not set up on cell. Wanted to make sure I would be able to get in touch with pt before rescheduling appt.

## 2015-03-28 ENCOUNTER — Telehealth: Payer: Self-pay | Admitting: *Deleted

## 2015-03-28 ENCOUNTER — Encounter (HOSPITAL_COMMUNITY): Payer: Medicare PPO

## 2015-03-28 ENCOUNTER — Ambulatory Visit (HOSPITAL_COMMUNITY)
Admission: RE | Admit: 2015-03-28 | Discharge: 2015-03-28 | Disposition: A | Discharge status: Home / Self Care | Payer: Medicare PPO | Source: Ambulatory Visit | Attending: Physician Assistant | Admitting: Physician Assistant

## 2015-03-28 ENCOUNTER — Ambulatory Visit
Admission: RE | Admit: 2015-03-28 | Discharge: 2015-03-28 | Disposition: A | Discharge status: Home / Self Care | Payer: Medicare PPO | Source: Ambulatory Visit | Attending: Radiation Oncology | Admitting: Radiation Oncology

## 2015-03-28 DIAGNOSIS — M79642 Pain in left hand: Secondary | ICD-10-CM | POA: Diagnosis not present

## 2015-03-28 DIAGNOSIS — M7989 Other specified soft tissue disorders: Secondary | ICD-10-CM | POA: Insufficient documentation

## 2015-03-28 DIAGNOSIS — M79602 Pain in left arm: Secondary | ICD-10-CM | POA: Insufficient documentation

## 2015-03-28 DIAGNOSIS — Z510 Encounter for antineoplastic radiation therapy: Secondary | ICD-10-CM | POA: Diagnosis not present

## 2015-03-28 DIAGNOSIS — M25532 Pain in left wrist: Secondary | ICD-10-CM | POA: Diagnosis not present

## 2015-03-28 DIAGNOSIS — C50311 Malignant neoplasm of lower-inner quadrant of right female breast: Secondary | ICD-10-CM | POA: Diagnosis not present

## 2015-03-28 DIAGNOSIS — Z171 Estrogen receptor negative status [ER-]: Secondary | ICD-10-CM | POA: Diagnosis not present

## 2015-03-28 NOTE — Progress Notes (Signed)
VASCULAR LAB PRELIMINARY  PRELIMINARY  PRELIMINARY  PRELIMINARY  Left upper extremity venous duplex completed.    Preliminary report:  Left :  No evidence of DVT or superficial thrombosis.    Jordan [name], RVS 03/28/2015, 4:04 PM

## 2015-03-28 NOTE — Telephone Encounter (Signed)
Bennett Scrape this pts report of her Venous Duplex is in EPIC.  Pt is stating that she is in a lot of pain and will you call her some meds in for pain. She states Tylenol is not working.

## 2015-03-29 NOTE — Telephone Encounter (Signed)
Pain is better today - she felt like the pain got worse after her [place] yesterday when her arm was held in uncomfortable positions for long periods of time.  I woke her up from sleep at 11am and she felt better as I was talking to her compared to when she called yesterday.  We will use her regular medications and contact me this afternoon if her pain has continued for me to call her in some pain medication.

## 2015-03-31 ENCOUNTER — Ambulatory Visit
Admission: RE | Admit: 2015-03-31 | Discharge: 2015-03-31 | Disposition: A | Discharge status: Home / Self Care | Payer: Medicare PPO | Source: Ambulatory Visit | Attending: Radiation Oncology | Admitting: Radiation Oncology

## 2015-03-31 VITALS — BP 111/70 | HR 79 | Temp 98.3°F | Wt 228.8 lb

## 2015-03-31 DIAGNOSIS — C50311 Malignant neoplasm of lower-inner quadrant of right female breast: Secondary | ICD-10-CM

## 2015-03-31 DIAGNOSIS — Z171 Estrogen receptor negative status [ER-]: Secondary | ICD-10-CM | POA: Diagnosis not present

## 2015-03-31 DIAGNOSIS — Z510 Encounter for antineoplastic radiation therapy: Secondary | ICD-10-CM | POA: Diagnosis not present

## 2015-03-31 NOTE — Progress Notes (Signed)
Weekly assessment of radiation to right breast.Completed 15 of 33 treatments.Denies pain.Mild tanning of skin.

## 2015-03-31 NOTE — Progress Notes (Signed)
Weekly Management Note:  Site: Right breast Current Dose:  2700  cGy Projected Dose: 4500  cGy followed by boost  Narrative: The patient is seen today for routine under treatment assessment. CBCT/MVCT images/port films were reviewed. The chart was reviewed.   She is without complaints today.  She uses Radioplex gel.  Her left wrist is in a splint because of a sprain.  Physical Examination:  Filed Vitals:   03/31/15 1115  BP: 111/70  Pulse: 79  Temp: 98.3 F (36.8 C)  .  Weight: 228 lb 12.8 oz (103.783 kg).  There is faint hyperpigmentation the skin along the right inframammary region.  No areas of desquamation.  Impression: Tolerating radiation therapy well.  Plan: Continue radiation therapy as planned.

## 2015-04-01 ENCOUNTER — Ambulatory Visit
Admission: RE | Admit: 2015-04-01 | Discharge: 2015-04-01 | Disposition: A | Discharge status: Home / Self Care | Payer: Medicare PPO | Source: Ambulatory Visit | Attending: Radiation Oncology | Admitting: Radiation Oncology

## 2015-04-01 DIAGNOSIS — Z510 Encounter for antineoplastic radiation therapy: Secondary | ICD-10-CM | POA: Diagnosis not present

## 2015-04-01 DIAGNOSIS — Z171 Estrogen receptor negative status [ER-]: Secondary | ICD-10-CM | POA: Diagnosis not present

## 2015-04-01 DIAGNOSIS — C50311 Malignant neoplasm of lower-inner quadrant of right female breast: Secondary | ICD-10-CM | POA: Diagnosis not present

## 2015-04-02 ENCOUNTER — Ambulatory Visit
Admission: RE | Admit: 2015-04-02 | Discharge: 2015-04-02 | Disposition: A | Discharge status: Home / Self Care | Payer: Medicare PPO | Source: Ambulatory Visit | Attending: Radiation Oncology | Admitting: Radiation Oncology

## 2015-04-02 DIAGNOSIS — C50311 Malignant neoplasm of lower-inner quadrant of right female breast: Secondary | ICD-10-CM | POA: Diagnosis not present

## 2015-04-02 DIAGNOSIS — Z171 Estrogen receptor negative status [ER-]: Secondary | ICD-10-CM | POA: Diagnosis not present

## 2015-04-02 DIAGNOSIS — Z510 Encounter for antineoplastic radiation therapy: Secondary | ICD-10-CM | POA: Diagnosis not present

## 2015-04-03 ENCOUNTER — Ambulatory Visit
Admission: RE | Admit: 2015-04-03 | Discharge: 2015-04-03 | Disposition: A | Discharge status: Home / Self Care | Payer: Medicare PPO | Source: Ambulatory Visit | Attending: Radiation Oncology | Admitting: Radiation Oncology

## 2015-04-03 DIAGNOSIS — Z510 Encounter for antineoplastic radiation therapy: Secondary | ICD-10-CM | POA: Diagnosis not present

## 2015-04-03 DIAGNOSIS — Z171 Estrogen receptor negative status [ER-]: Secondary | ICD-10-CM | POA: Diagnosis not present

## 2015-04-03 DIAGNOSIS — C50311 Malignant neoplasm of lower-inner quadrant of right female breast: Secondary | ICD-10-CM | POA: Diagnosis not present

## 2015-04-04 ENCOUNTER — Ambulatory Visit
Admission: RE | Admit: 2015-04-04 | Discharge: 2015-04-04 | Disposition: A | Discharge status: Home / Self Care | Payer: Medicare PPO | Source: Ambulatory Visit | Attending: Radiation Oncology | Admitting: Radiation Oncology

## 2015-04-04 DIAGNOSIS — Z510 Encounter for antineoplastic radiation therapy: Secondary | ICD-10-CM | POA: Diagnosis not present

## 2015-04-04 DIAGNOSIS — Z171 Estrogen receptor negative status [ER-]: Secondary | ICD-10-CM | POA: Diagnosis not present

## 2015-04-04 DIAGNOSIS — C50311 Malignant neoplasm of lower-inner quadrant of right female breast: Secondary | ICD-10-CM | POA: Diagnosis not present

## 2015-04-07 ENCOUNTER — Ambulatory Visit
Admission: RE | Admit: 2015-04-07 | Discharge: 2015-04-07 | Disposition: A | Discharge status: Home / Self Care | Payer: Medicare PPO | Source: Ambulatory Visit | Attending: Radiation Oncology | Admitting: Radiation Oncology

## 2015-04-07 ENCOUNTER — Ambulatory Visit: Payer: Medicare PPO | Admitting: Radiation Oncology

## 2015-04-07 VITALS — BP 102/65 | HR 75 | Temp 98.2°F | Ht 61.5 in | Wt 227.8 lb

## 2015-04-07 DIAGNOSIS — C50311 Malignant neoplasm of lower-inner quadrant of right female breast: Secondary | ICD-10-CM

## 2015-04-07 DIAGNOSIS — Z510 Encounter for antineoplastic radiation therapy: Secondary | ICD-10-CM | POA: Diagnosis not present

## 2015-04-07 DIAGNOSIS — Z171 Estrogen receptor negative status [ER-]: Secondary | ICD-10-CM | POA: Diagnosis not present

## 2015-04-07 NOTE — Progress Notes (Signed)
Weekly Management Note:  Site: Right breast Current Dose:  3600  cGy Projected Dose: 4500  cGy followed by 8 fraction boost  Narrative: The patient is seen today for routine under treatment assessment. CBCT/MVCT images/port films were reviewed. The chart was reviewed.   She is without new complaints today.  She states that her left wrist remains swollen from her fall.  Physical Examination:  Filed Vitals:   04/07/15 1233  BP: 102/65  Pulse: 75  Temp: 98.2 F (36.8 C)  .  Weight: 227 lb 12.8 oz (103.329 kg).  There is mild hyperpigmentation along the right breast with focal desquamation along the inframammary region.  Impression: Tolerating radiation therapy well.  Plan: Continue radiation therapy as planned.

## 2015-04-07 NOTE — Progress Notes (Signed)
Jordan [name] has received has 20 fractions to her right breast.  Note mild desquamation in the inframmary fold and hyperpigmentation of the breast.

## 2015-04-08 ENCOUNTER — Ambulatory Visit: Admission: RE | Admit: 2015-04-08 | Payer: Medicare PPO | Source: Ambulatory Visit | Admitting: Radiation Oncology

## 2015-04-08 ENCOUNTER — Ambulatory Visit
Admission: RE | Admit: 2015-04-08 | Discharge: 2015-04-08 | Disposition: A | Discharge status: Home / Self Care | Payer: Medicare PPO | Source: Ambulatory Visit | Attending: Radiation Oncology | Admitting: Radiation Oncology

## 2015-04-08 DIAGNOSIS — C50311 Malignant neoplasm of lower-inner quadrant of right female breast: Secondary | ICD-10-CM | POA: Diagnosis not present

## 2015-04-08 DIAGNOSIS — Z171 Estrogen receptor negative status [ER-]: Secondary | ICD-10-CM | POA: Diagnosis not present

## 2015-04-08 DIAGNOSIS — Z510 Encounter for antineoplastic radiation therapy: Secondary | ICD-10-CM | POA: Diagnosis not present

## 2015-04-09 ENCOUNTER — Ambulatory Visit: Admission: RE | Admit: 2015-04-09 | Payer: Medicare PPO | Source: Ambulatory Visit | Admitting: Radiation Oncology

## 2015-04-09 ENCOUNTER — Ambulatory Visit
Admission: RE | Admit: 2015-04-09 | Discharge: 2015-04-09 | Disposition: A | Discharge status: Home / Self Care | Payer: Medicare PPO | Source: Ambulatory Visit | Attending: Radiation Oncology | Admitting: Radiation Oncology

## 2015-04-09 DIAGNOSIS — Z510 Encounter for antineoplastic radiation therapy: Secondary | ICD-10-CM | POA: Diagnosis not present

## 2015-04-09 DIAGNOSIS — C50311 Malignant neoplasm of lower-inner quadrant of right female breast: Secondary | ICD-10-CM | POA: Diagnosis not present

## 2015-04-09 DIAGNOSIS — Z171 Estrogen receptor negative status [ER-]: Secondary | ICD-10-CM | POA: Diagnosis not present

## 2015-04-10 ENCOUNTER — Ambulatory Visit
Admission: RE | Admit: 2015-04-10 | Discharge: 2015-04-10 | Disposition: A | Discharge status: Home / Self Care | Payer: Medicare PPO | Source: Ambulatory Visit | Attending: Radiation Oncology | Admitting: Radiation Oncology

## 2015-04-10 DIAGNOSIS — C50311 Malignant neoplasm of lower-inner quadrant of right female breast: Secondary | ICD-10-CM | POA: Diagnosis not present

## 2015-04-10 DIAGNOSIS — Z171 Estrogen receptor negative status [ER-]: Secondary | ICD-10-CM | POA: Diagnosis not present

## 2015-04-10 DIAGNOSIS — Z510 Encounter for antineoplastic radiation therapy: Secondary | ICD-10-CM | POA: Diagnosis not present

## 2015-04-11 ENCOUNTER — Ambulatory Visit
Admission: RE | Admit: 2015-04-11 | Discharge: 2015-04-11 | Disposition: A | Discharge status: Home / Self Care | Payer: Medicare PPO | Source: Ambulatory Visit | Attending: Radiation Oncology | Admitting: Radiation Oncology

## 2015-04-11 DIAGNOSIS — Z510 Encounter for antineoplastic radiation therapy: Secondary | ICD-10-CM | POA: Diagnosis not present

## 2015-04-11 DIAGNOSIS — C50311 Malignant neoplasm of lower-inner quadrant of right female breast: Secondary | ICD-10-CM | POA: Diagnosis not present

## 2015-04-11 DIAGNOSIS — Z171 Estrogen receptor negative status [ER-]: Secondary | ICD-10-CM | POA: Diagnosis not present

## 2015-04-14 ENCOUNTER — Ambulatory Visit
Admission: RE | Admit: 2015-04-14 | Discharge: 2015-04-14 | Disposition: A | Discharge status: Home / Self Care | Payer: Medicare PPO | Source: Ambulatory Visit | Attending: Radiation Oncology | Admitting: Radiation Oncology

## 2015-04-14 VITALS — BP 111/86 | HR 79 | Temp 98.0°F | Wt 226.8 lb

## 2015-04-14 DIAGNOSIS — C50311 Malignant neoplasm of lower-inner quadrant of right female breast: Secondary | ICD-10-CM | POA: Diagnosis not present

## 2015-04-14 DIAGNOSIS — Z510 Encounter for antineoplastic radiation therapy: Secondary | ICD-10-CM | POA: Diagnosis not present

## 2015-04-14 DIAGNOSIS — Z171 Estrogen receptor negative status [ER-]: Secondary | ICD-10-CM | POA: Diagnosis not present

## 2015-04-14 NOTE — Progress Notes (Signed)
Weekly Management Note:  Site: right breast Current Dose:   4500  cGy Projected Dose:  4500  cGy  followed by 8 fraction boost  Narrative: The patient is seen today for routine under treatment assessment. CBCT/MVCT images/port films were reviewed. The chart was reviewed.    She is without complaints today. She uses Radioplex gel.  Physical Examination:  Filed Vitals:   04/14/15 1119  BP: 111/86  Pulse: 79  Temp: 98 F (36.7 C)  .  Weight: 226 lb 12.8 oz (102.876 kg).  There is hyperpigmentation the skin with no areas of desquamation. There is impending desquamation along the inframammary fold.  Impression: Tolerating radiation therapy well.  Plan: Continue radiation therapy as planned.

## 2015-04-14 NOTE — Progress Notes (Signed)
Weekly assessment of radiation to right [employer].ch 25 of 33 treatments.Skin with mild tanning, no peeling.Denies pain.Fatigue improved.

## 2015-04-15 ENCOUNTER — Telehealth: Payer: Self-pay | Admitting: Oncology

## 2015-04-15 ENCOUNTER — Ambulatory Visit (HOSPITAL_BASED_OUTPATIENT_CLINIC_OR_DEPARTMENT_OTHER): Payer: Medicare PPO | Admitting: Oncology

## 2015-04-15 ENCOUNTER — Other Ambulatory Visit (HOSPITAL_BASED_OUTPATIENT_CLINIC_OR_DEPARTMENT_OTHER): Payer: Medicare PPO

## 2015-04-15 ENCOUNTER — Ambulatory Visit
Admission: RE | Admit: 2015-04-15 | Discharge: 2015-04-15 | Disposition: A | Discharge status: Home / Self Care | Payer: Medicare PPO | Source: Ambulatory Visit | Attending: Radiation Oncology | Admitting: Radiation Oncology

## 2015-04-15 VITALS — BP 91/56 | HR 62 | Temp 98.0°F | Resp 18 | Ht 61.5 in | Wt 226.9 lb

## 2015-04-15 DIAGNOSIS — R7309 Other abnormal glucose: Secondary | ICD-10-CM

## 2015-04-15 DIAGNOSIS — I82C12 Acute embolism and thrombosis of left internal jugular vein: Secondary | ICD-10-CM

## 2015-04-15 DIAGNOSIS — Z7901 Long term (current) use of anticoagulants: Secondary | ICD-10-CM

## 2015-04-15 DIAGNOSIS — C50311 Malignant neoplasm of lower-inner quadrant of right female breast: Secondary | ICD-10-CM

## 2015-04-15 DIAGNOSIS — Z171 Estrogen receptor negative status [ER-]: Secondary | ICD-10-CM | POA: Diagnosis not present

## 2015-04-15 DIAGNOSIS — Z510 Encounter for antineoplastic radiation therapy: Secondary | ICD-10-CM | POA: Diagnosis not present

## 2015-04-15 LAB — COMPREHENSIVE METABOLIC PANEL (CC13)
ALT: 24 U/L (ref 0–55)
ANION GAP: 8 meq/L (ref 3–11)
AST: 25 U/L (ref 5–34)
Albumin: 3.2 g/dL — ABNORMAL LOW (ref 3.5–5.0)
Alkaline Phosphatase: 45 U/L (ref 40–150)
BILIRUBIN TOTAL: 0.78 mg/dL (ref 0.20–1.20)
BUN: 16.5 mg/dL (ref 7.0–26.0)
CALCIUM: 9 mg/dL (ref 8.4–10.4)
CHLORIDE: 112 meq/L — AB (ref 98–109)
CO2: 24 mEq/L (ref 22–29)
CREATININE: 0.9 mg/dL (ref 0.6–1.1)
EGFR: 73 mL/min/{1.73_m2} — ABNORMAL LOW (ref >90)
Glucose: 109 mg/dl (ref 70–140)
Potassium: 3.2 mEq/L — ABNORMAL LOW (ref 3.5–5.1)
Sodium: 145 mEq/L (ref 136–145)
TOTAL PROTEIN: 6.2 g/dL — AB (ref 6.4–8.3)

## 2015-04-15 LAB — CBC WITH DIFFERENTIAL/PLATELET
BASO%: 0.8 % (ref 0.0–2.0)
BASOS ABS: 0.1 10*3/uL (ref 0.0–0.1)
EOS ABS: 0.4 10*3/uL (ref 0.0–0.5)
EOS%: 5.6 % (ref 0.0–7.0)
HCT: 40.1 % (ref 34.8–46.6)
HGB: 12.7 g/dL (ref 11.6–15.9)
LYMPH#: 0.8 10*3/uL — AB (ref 0.9–3.3)
LYMPH%: 11.9 % — AB (ref 14.0–49.7)
MCH: 28.6 pg (ref 25.1–34.0)
MCHC: 31.7 g/dL (ref 31.5–36.0)
MCV: 90.2 fL (ref 79.5–101.0)
MONO#: 0.5 10*3/uL (ref 0.1–0.9)
MONO%: 8.1 % (ref 0.0–14.0)
NEUT#: 4.8 10*3/uL (ref 1.5–6.5)
NEUT%: 73.6 % (ref 38.4–76.8)
PLATELETS: 248 10*3/uL (ref 145–400)
RBC: 4.45 10*6/uL (ref 3.70–5.45)
RDW: 16.6 % — ABNORMAL HIGH (ref 11.2–14.5)
WBC: 6.5 10*3/uL (ref 3.9–10.3)

## 2015-04-15 NOTE — Progress Notes (Signed)
[place] [facility]  [phone] [phone]) [phone]     ID: Jordan [name] DOB: 10/29/1950  MR#: [id number]  ZHY#:[id number]  Patient Care Team: Harrison Mons, PA-C as PCP - General (Physician Assistant) [name], MD as Consulting Physician (General Surgery) Chauncey Cruel, MD as Consulting Physician (Oncology) Arloa Koh, MD as Consulting Physician (Radiation Oncology) Mauro Kaufmann, RN as Registered Nurse Rockwell Germany, RN as Registered Nurse Holley Bouche, NP as Nurse Practitioner (Nurse Practitioner) PCP: [name] OTHER MD: [name] MD  CHIEF COMPLAINT: Early stage triple negative breast cancer  CURRENT TREATMENT: Completing adjuvant radiation BREAST CANCER HISTORY: From the original intake note:  "Jordan [name]" [facility] 2015 showing breast density category B. A possible mass in the right breast was noted and on Jerry 20 03/05/2015 the patient underwent right diagnostic mammography with right ultrasonography. Spot compression views confirmed a slightly irregular mass in the lower inner quadrant of the right breast. This was not palpable by physical exam. Ultrasound showed a small hypoechoic mass measuring 5 mm in the area in question. Ultrasound of the right axilla was unremarkable.  Biopsy of the mass in question fibrin 05/06/2015 showed ([id number]) and invasive ductal carcinoma, grade 2, estrogen and progesterone receptor negative, with an MIB-1 of 41%, and no HER-2 amplification.  Bilateral breast MRI was obtained 10/04/2014. Results are pending.  The patient's subsequent history is as detailed below   INTERVAL HISTORY:  Jordan [name] returns today for follow up of her breast cancer. She is currently receiving her adjuvant radiation treatments, which will be completed nextweek.Sheistoleratingthemwell.Shedoesfeelfatigue and she is having mild skin problems as described  below  REVIEW OF SYSTEMS: Jordan [name] is walking perhaps 1000 steps a day according to her phone. She would like that to climb to about 4000 but right now she is a little bit too tired to push too much. She fell hit the top and injured her left wrist. X-rays were obtained which showed no fracture. She is wearing a splint, which helps. She is having some pain there and in other joints as well. The other pains are not new. There are not more intense or persistent than before. She continues on Xarelto daily for her left internal jugular vein clot diagnosed in April. She has had no bleeding complications from that medication. A detailed review of systems today was otherwise stable  PAST MEDICAL HISTORY: Past Medical History  Diagnosis Date  . Hyperlipidemia     takes Zetia and Zocor daily  . Thyroid disease   . Nasal congestion   . Leg swelling   . Constipation   . Nausea   . Generalized headaches     due to allergies, sinus  . PONV (postoperative nausea and vomiting)     pt states she is very easy to sedate  . Hypertension     takes Maxzide and Metoprolol daily  . Pneumonia     walking about 6-33yr ago  . History of bronchitis     last time about 6-741yrago  . Hx of seasonal allergies     takes OTC allergy med nightly  . History of migraine     last one about 15+yrs ago  . Dizziness     r/t side effects from meds  . Vertigo     takes Meclizine prn  . Joint pain   . Back pain     buldging disc  . GERD (gastroesophageal reflux  disease)     takes Protonix as needed  . Hemorrhoids   . History of colon polyps   . Mass of colon   . Cancer     colon  . History of UTI   . Hypothyroidism     takes Synthroid daily  . Depression     takes Wellbutrin daily  . Wears glasses   . Breast cancer 10/17/2014    lower inner quadrant of the right breast    PAST SURGICAL HISTORY: Past Surgical History  Procedure Laterality Date  . Cesarean section  1968, 1970  . Knee surgery  1998    right -  arthroscopic  . Abdominal hysterectomy    . Exploratory laparotomy    . Carpal tunnel release    . Colonoscopy    . Esophagogastroduodenoscopy    . Partial colectomy  03/30/2012    Procedure: PARTIAL COLECTOMY;  Surgeon: [name], MD;  Location: [facility];  Service: General;  Laterality: N/A;  . Appendectomy    . Radioactive seed guided mastectomy with axillary sentinel lymph node biopsy Right 10/17/2014    Procedure: RADIOACTIVE SEED GUIDED PARTIAL MASTECTOMY WITH AXILLARY SENTINEL LYMPH NODE BIOPSY;  Surgeon: [name], MD;  Location: [facility];  Service: General;  Laterality: Right;    FAMILY HISTORY Family History  Problem Relation Age of Onset  . Cancer Father 67    prostate  . Cancer Brother 58    colon  . Deep vein thrombosis Brother   . Cancer Maternal Aunt 76    breast  . Cancer Maternal Grandmother 27    breast  . Cancer Paternal Grandmother     colon  . Pulmonary embolism Brother     long-distance truck driver (PE x 2)  . Cancer Brother 64    colon  . Diabetes Maternal Grandfather   . Cancer Maternal Aunt 75    unknown type  . Mental illness Sister     history of Depression  . Seizures Sister 8    s/p traumatic brain injury  . Cancer Maternal Uncle     prostate   the patient's parents are still living, as of February 2016. Her father is 31 years old, with a history of prostate cancer diagnosed in his late 2s. The patient's mother is 18 years old. The patient had 5 brothers, 3 sisters. One brother died with prostate cancer at age 4. Another brother has a history of prostate cancer, age 56. One brother had colon cancer diagnosed age 22 as did a paternal grandmother. A maternal grandmother had breast cancer at age 69 as did a maternal aunt (diagnosed age 20.) A cousin was diagnosed with breast cancer the age of 20.  GYNECOLOGIC HISTORY:  No LMP recorded. Patient has had a hysterectomy. Menarche age 48, first live birth age 77. The patient is GX  P1. She stopped having periods in 1981 when she underwent hysterectomy and unilateral salpingo-oophorectomy.. She did not take hormone replacement.  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 Her husband [name] owns a business that [employer] at night. The patient's biological son [name] works in Biomedical scientist in [place]. He has 2 children. One of them, the patient's granddaughter [name], 88 years old, lives with the patient, and is a Interior and spatial designer at [employer]. The patient also has an adopted son, [name], lives in [place]. Jordan [name] attends a city of refugee church in [place] where she grew up    ADVANCED  DIRECTIVES: Not in place  HEALTH MAINTENANCE: Social History  Substance Use Topics  . Smoking status: Never Smoker   . Smokeless tobacco: Never Used  . Alcohol Use: No     Colonoscopy: 03/30/2013/[facility]  PAP:  Bone density:  Lipid panel:  No Known Allergies  Current Outpatient Prescriptions  Medication Sig Dispense Refill  . buPROPion (WELLBUTRIN XL) 150 MG 24 hr tablet Take 1 tablet (150 mg total) by mouth daily. 30 tablet 5  . ezetimibe (ZETIA) 10 MG tablet Take 1 tablet (10 mg total) by mouth daily. 30 tablet 5  . levothyroxine (SYNTHROID, LEVOTHROID) 125 MCG tablet TAKE 1 TABLET BY MOUTH DAILY 30 tablet 5  . loratadine (CLARITIN) 10 MG tablet Take 10 mg by mouth daily.  4  . LORazepam (ATIVAN) 0.5 MG tablet Take 1 tablet (0.5 mg total) by mouth at bedtime as needed for anxiety. 30 tablet 1  . meclizine (ANTIVERT) 25 MG tablet Take 1 tablet (25 mg total) by mouth 4 (four) times daily as needed for dizziness. 30 tablet 1  . meloxicam (MOBIC) 15 MG tablet Take 1 tablet (15 mg total) by mouth daily. 30 tablet 0  . metoprolol succinate (TOPROL-XL) 25 MG 24 hr tablet Take 1 tablet (25 mg total) by mouth daily. 30 tablet 5  . nitroGLYCERIN (NITROSTAT) 0.4 MG SL tablet Place 1 tablet (0.4 mg total) under the tongue every 5 (five) minutes as  needed for chest pain. 30 tablet 1  . Prenatal Multivit-Min-Fe-FA (PRENATAL VITAMINS PO) Take by mouth.    . rivaroxaban (XARELTO) 20 MG TABS tablet Take 1 tablet (20 mg total) by mouth daily with supper. (Patient taking differently: Take 20 mg by mouth daily. ) 30 tablet 3  . simvastatin (ZOCOR) 40 MG tablet Take 1 tablet (40 mg total) by mouth at bedtime. 30 tablet 5  . triamterene-hydrochlorothiazide (MAXZIDE-25) 37.5-25 MG per tablet TAKE 1 TABLET DAILY (Patient taking differently: 1 tablet. TAKE 1 TABLET DAILY) 30 tablet 12   No current facility-administered medications for this visit.   Facility-Administered Medications Ordered in Other Visits  Medication Dose Route Frequency Provider Last Rate Last Dose  . chlorhexidine (HIBICLENS) 4 % liquid 1 application  1 application Topical Once [name], MD        OBJECTIVE: Middle-aged African-American woman walking with a cane Filed Vitals:   04/15/15 1119  BP: 91/56  Pulse: 62  Temp: 98 F (36.7 C)  Resp: 18     Body mass index is 42.18 kg/(m^2).    ECOG FS:1 - Symptomatic but completely ambulatory  Sclerae unicteric, EOMs intact Oropharynx clear and moist obese, No cervical or supraclavicular adenopathy Lungs no rales or rhonchi Heart regular rate and rhythm Abd soft, nontender, positive bowel sounds MSK no focal spinal tenderness, grade 1 left wrist and hand swelling Neuro: nonfocal, well oriented, appropriate affect Breasts: The right breast is status post lumpectomy and is currently receiving radiation. There is some hyperpigmentation. There is dry desquamation in the inferior mammary fold. Otherwise there is no evidence of disease recurrence. The right axilla is benign per the left breast is unremarkable.  LAB RESULTS:  CMP     Component Value Date/Time   NA 145 04/15/2015 0957   NA 144 12/03/2014 0340   K 3.2* 04/15/2015 0957   K 4.0 12/03/2014 0340   CL 108 12/03/2014 0340   CO2 24 04/15/2015 0957   CO2 24  12/03/2014 0340   GLUCOSE 109 04/15/2015 0957   GLUCOSE 105*  12/03/2014 0340   BUN 16.5 04/15/2015 0957   BUN 13 12/03/2014 0340   CREATININE 0.9 04/15/2015 0957   CREATININE 0.98 12/03/2014 0340   CREATININE 1.04 08/20/2014 1238   CALCIUM 9.0 04/15/2015 0957   CALCIUM 8.9 12/03/2014 0340   PROT 6.2* 04/15/2015 0957   PROT 6.9 08/20/2014 1238   ALBUMIN 3.2* 04/15/2015 0957   ALBUMIN 3.9 08/20/2014 1238   AST 25 04/15/2015 0957   AST 23 08/20/2014 1238   ALT 24 04/15/2015 0957   ALT 24 08/20/2014 1238   ALKPHOS 45 04/15/2015 0957   ALKPHOS 54 08/20/2014 1238   BILITOT 0.78 04/15/2015 0957   BILITOT 0.6 08/20/2014 1238   GFRNONAA 59* 12/03/2014 0340   GFRAA 68* 12/03/2014 0340    INo results found for: SPEP, UPEP  Lab Results  Component Value Date   WBC 6.5 04/15/2015   NEUTROABS 4.8 04/15/2015   HGB 12.7 04/15/2015   HCT 40.1 04/15/2015   MCV 90.2 04/15/2015   PLT 248 04/15/2015      Chemistry      Component Value Date/Time   NA 145 04/15/2015 0957   NA 144 12/03/2014 0340   K 3.2* 04/15/2015 0957   K 4.0 12/03/2014 0340   CL 108 12/03/2014 0340   CO2 24 04/15/2015 0957   CO2 24 12/03/2014 0340   BUN 16.5 04/15/2015 0957   BUN 13 12/03/2014 0340   CREATININE 0.9 04/15/2015 0957   CREATININE 0.98 12/03/2014 0340   CREATININE 1.04 08/20/2014 1238      Component Value Date/Time   CALCIUM 9.0 04/15/2015 0957   CALCIUM 8.9 12/03/2014 0340   ALKPHOS 45 04/15/2015 0957   ALKPHOS 54 08/20/2014 1238   AST 25 04/15/2015 0957   AST 23 08/20/2014 1238   ALT 24 04/15/2015 0957   ALT 24 08/20/2014 1238   BILITOT 0.78 04/15/2015 0957   BILITOT 0.6 08/20/2014 1238       No results found for: LABCA2  No components found for: LABCA125  No results for input(s): INR in the last 168 hours.  Urinalysis    Component Value Date/Time   COLORURINE YELLOW 03/09/2011 0857   APPEARANCEUR CLOUDY* 03/09/2011 0857   LABSPEC 1.022 03/09/2011 0857   PHURINE 5.5  03/09/2011 0857   GLUCOSEU NEGATIVE 03/09/2011 0857   HGBUR NEGATIVE 03/09/2011 0857   BILIRUBINUR SMALL* 03/09/2011 0857   KETONESUR NEGATIVE 03/09/2011 0857   PROTEINUR NEGATIVE 03/09/2011 0857   UROBILINOGEN 0.2 03/09/2011 0857   NITRITE NEGATIVE 03/09/2011 0857   LEUKOCYTESUR LARGE* 03/09/2011 0857    STUDIES: Dg Hand Complete Left  03/25/2015   CLINICAL DATA:  Left hand pain after fall in bathtub. Initial encounter.  EXAM: LEFT HAND - COMPLETE 3+ VIEW  COMPARISON:  None.  FINDINGS: There is no evidence of fracture or dislocation. There is no evidence of arthropathy or other focal bone abnormality. Soft tissues are unremarkable.  IMPRESSION: Normal left hand.   Electronically Signed   By: Marijo Conception, M.D.   On: 03/25/2015 17:25    ASSESSMENT: 64 y.o.  [place] woman status post right breast lower inner quadrant biopsy 09/24/2014 for a clinical T1a N0, stage IA invasive ductal carcinoma, grade 1 or 2, estrogen and progesterone receptor negative, with an MIB-1 of 41%, and HER-2 negative   (1) status post right lumpectomy and axillary sentinel lymph node sampling 10/17/2014 for a pT1b pN0, stage IA  invasive ductal carcinoma, grade 3, repeat HER-2 again negative.   (2) adjuvant chemotherapy  started 12/09/2014, consisting of cyclophosphamide and docetaxel given every 3 weeks 4, completed 02/10/2015  (a) the docetaxel dose was decreased after the first cycle because of elevations in transaminases.   (3) adjuvant radiation started 03/18/2015  (4) status post partial right colectomy with lymphadenectomy 03/30/2012 for a 2.7 cm tubular adenoma with high-grade dysplasia, no evidence of invasion, 0 of 11 lymph nodes involved (SZA [phone])  (5) genetics testing sent 10/04/2014 through the [facility] gene panel /[facility] found no deleterious mutations in ATM, BARD1, BRCA1, BRCA2, BRIP1, CDH1, CHEK2, EPCAM, MLH1, MRE11A, MSH2, MSH6, MUTYH, NBN, NF1, PALB2, PMS2, PTEN, RAD50, RAD51C,  RAD51D, SMARCA4, STK11, or TP53  (a) two variants of uncertain significance were found    (i) ATM, p.L2330V   (ii) SMARCA4, p.L8453M  (6) left IJ DVT 12/02/14, started on xarelto  (a) left upper extremity Doppler ultrasonography 03/28/2015 was negative: also shows left IJ clear  (7) elevated blood glucose. A1c on 12/03/14 was 6.4.  PLAN: Jordan [name] will complete her radiation treatments next week. I think over the next several weeks she will be improving her functional status some so that instead of walking thousand steps a day she will walk perhaps 09-3998 steps per day, which is her goal.  She had a Doppler ultrasonography of her left upper extremity because of her fall and injury to her left wrist. That study also shows that the left subclavian vein was phasic, spontaneous, and compressible, indicating that the clot there has cleared. Accordingly she will stop the Xarelto when she runs out of medication (she has approximately 4 weeks on hand).  Once she has been off the Xarelto a week, she can have her port removed.  I think Valere Dross would be an excellent candidate for our finding your new normal group. Interestingly she also belongs to "women empowering women" group. She has a very clear idea of what it means to be responsible for one self and developing self respect.  She will see [place] again in approximately 3 months. At that time we will initiate long-term follow-up. If she will see Dr. Marlou Starks 3 months after that visit, we will see her again 3 months after her visit with Dr. Marlou Starks, and continue to "tag team her" in that fashion until she completes her first 2 years of follow-up, after which we will broaden the follow-up interval.  Chauncey Cruel, MD   04/15/2015 11:50 AM

## 2015-04-15 NOTE — Telephone Encounter (Signed)
Gave avs & calendar for November.  °

## 2015-04-16 ENCOUNTER — Ambulatory Visit
Admission: RE | Admit: 2015-04-16 | Discharge: 2015-04-16 | Disposition: A | Discharge status: Home / Self Care | Payer: Medicare PPO | Source: Ambulatory Visit | Attending: Radiation Oncology | Admitting: Radiation Oncology

## 2015-04-16 DIAGNOSIS — Z510 Encounter for antineoplastic radiation therapy: Secondary | ICD-10-CM | POA: Diagnosis not present

## 2015-04-16 DIAGNOSIS — C50311 Malignant neoplasm of lower-inner quadrant of right female breast: Secondary | ICD-10-CM | POA: Diagnosis not present

## 2015-04-16 DIAGNOSIS — Z171 Estrogen receptor negative status [ER-]: Secondary | ICD-10-CM | POA: Diagnosis not present

## 2015-04-17 ENCOUNTER — Ambulatory Visit
Admission: RE | Admit: 2015-04-17 | Discharge: 2015-04-17 | Disposition: A | Discharge status: Home / Self Care | Payer: Medicare PPO | Source: Ambulatory Visit | Attending: Radiation Oncology | Admitting: Radiation Oncology

## 2015-04-17 DIAGNOSIS — C50311 Malignant neoplasm of lower-inner quadrant of right female breast: Secondary | ICD-10-CM | POA: Diagnosis not present

## 2015-04-17 DIAGNOSIS — Z171 Estrogen receptor negative status [ER-]: Secondary | ICD-10-CM | POA: Diagnosis not present

## 2015-04-17 DIAGNOSIS — Z510 Encounter for antineoplastic radiation therapy: Secondary | ICD-10-CM | POA: Diagnosis not present

## 2015-04-18 ENCOUNTER — Ambulatory Visit
Admission: RE | Admit: 2015-04-18 | Discharge: 2015-04-18 | Disposition: A | Discharge status: Home / Self Care | Payer: Medicare PPO | Source: Ambulatory Visit | Attending: Radiation Oncology | Admitting: Radiation Oncology

## 2015-04-18 DIAGNOSIS — Z510 Encounter for antineoplastic radiation therapy: Secondary | ICD-10-CM | POA: Diagnosis not present

## 2015-04-18 DIAGNOSIS — C50311 Malignant neoplasm of lower-inner quadrant of right female breast: Secondary | ICD-10-CM | POA: Diagnosis not present

## 2015-04-18 DIAGNOSIS — Z171 Estrogen receptor negative status [ER-]: Secondary | ICD-10-CM | POA: Diagnosis not present

## 2015-04-21 ENCOUNTER — Ambulatory Visit: Payer: Medicare PPO

## 2015-04-22 ENCOUNTER — Ambulatory Visit
Admission: RE | Admit: 2015-04-22 | Discharge: 2015-04-22 | Disposition: A | Discharge status: Home / Self Care | Payer: Medicare PPO | Source: Ambulatory Visit | Attending: Radiation Oncology | Admitting: Radiation Oncology

## 2015-04-22 ENCOUNTER — Ambulatory Visit: Payer: Medicare PPO

## 2015-04-22 VITALS — BP 103/52 | HR 80 | Temp 98.4°F | Wt 226.7 lb

## 2015-04-22 DIAGNOSIS — Z171 Estrogen receptor negative status [ER-]: Secondary | ICD-10-CM | POA: Diagnosis not present

## 2015-04-22 DIAGNOSIS — Z510 Encounter for antineoplastic radiation therapy: Secondary | ICD-10-CM | POA: Diagnosis not present

## 2015-04-22 DIAGNOSIS — C50311 Malignant neoplasm of lower-inner quadrant of right female breast: Secondary | ICD-10-CM

## 2015-04-22 MED ORDER — RADIAPLEXRX EX GEL
Freq: Once | CUTANEOUS | Status: AC
  Administered 2015-04-22: 12:00:00 via TOPICAL

## 2015-04-22 NOTE — Progress Notes (Signed)
Weekly assessment of radiation to right breast.Completed 31 of 33 treatments.Has mild dry peel of mammary fold.Applying neosporin pain ointment to this area and radiaplex on remaining breast which is discolored revealing maked follicles..Given another tube of radiaplex and appointment for one month follow up.Generalized fatigue.

## 2015-04-22 NOTE — Progress Notes (Signed)
Weekly Management Note:  Site:R Breast Current Dose:  5500  cGy Projected Dose: 6100  cGy  Narrative: The patient is seen today for routine under treatment assessment. CBCT/MVCT images/port films were reviewed. The chart was reviewed.   She is without complaints today except for discomfort along the inframammary region.  She has been applying Neosporin ointment.  Physical Examination:  Filed Vitals:   04/22/15 1116  BP: 103/52  Pulse: 80  Temp: 98.4 F (36.9 C)  .  Weight: 226 lb 11.2 oz (102.83 kg).  There is hyperpigmentation the skin with a small area of dry/moist desquamation along the right inframammary region.  Impression: Tolerating radiation therapy well, although she does have a small area of desquamation.  She will finish her treatment this Thursday.  Plan: One-month follow-up after completion of radiation therapy.

## 2015-04-23 ENCOUNTER — Ambulatory Visit
Admission: RE | Admit: 2015-04-23 | Discharge: 2015-04-23 | Disposition: A | Discharge status: Home / Self Care | Payer: Medicare PPO | Source: Ambulatory Visit | Attending: Radiation Oncology | Admitting: Radiation Oncology

## 2015-04-23 DIAGNOSIS — Z510 Encounter for antineoplastic radiation therapy: Secondary | ICD-10-CM | POA: Diagnosis not present

## 2015-04-23 DIAGNOSIS — C50311 Malignant neoplasm of lower-inner quadrant of right female breast: Secondary | ICD-10-CM | POA: Diagnosis not present

## 2015-04-23 DIAGNOSIS — Z171 Estrogen receptor negative status [ER-]: Secondary | ICD-10-CM | POA: Diagnosis not present

## 2015-04-24 ENCOUNTER — Ambulatory Visit: Payer: Medicare PPO

## 2015-04-24 ENCOUNTER — Ambulatory Visit
Admission: RE | Admit: 2015-04-24 | Discharge: 2015-04-24 | Disposition: A | Discharge status: Home / Self Care | Payer: Medicare PPO | Source: Ambulatory Visit | Attending: Radiation Oncology | Admitting: Radiation Oncology

## 2015-04-24 DIAGNOSIS — Z171 Estrogen receptor negative status [ER-]: Secondary | ICD-10-CM | POA: Diagnosis not present

## 2015-04-24 DIAGNOSIS — Z510 Encounter for antineoplastic radiation therapy: Secondary | ICD-10-CM | POA: Diagnosis not present

## 2015-04-24 DIAGNOSIS — C50311 Malignant neoplasm of lower-inner quadrant of right female breast: Secondary | ICD-10-CM | POA: Diagnosis not present

## 2015-04-25 ENCOUNTER — Ambulatory Visit
Admission: RE | Admit: 2015-04-25 | Discharge: 2015-04-25 | Disposition: A | Discharge status: Home / Self Care | Payer: Medicare PPO | Source: Ambulatory Visit | Attending: Radiation Oncology | Admitting: Radiation Oncology

## 2015-04-25 DIAGNOSIS — C50311 Malignant neoplasm of lower-inner quadrant of right female breast: Secondary | ICD-10-CM | POA: Diagnosis not present

## 2015-04-25 DIAGNOSIS — Z171 Estrogen receptor negative status [ER-]: Secondary | ICD-10-CM | POA: Diagnosis not present

## 2015-04-25 DIAGNOSIS — Z510 Encounter for antineoplastic radiation therapy: Secondary | ICD-10-CM | POA: Diagnosis not present

## 2015-04-28 ENCOUNTER — Encounter: Payer: Self-pay | Admitting: Radiation Oncology

## 2015-04-28 NOTE — Progress Notes (Signed)
[facility] End of Treatment Note  Name:Jordan [name]  Date: 04/28/2015 VAN:[id number] DOB:[birth year]   Status:outpatient    CC: JEFFERY,CHELLE, PA-C , Dr. [name]   REFERRING PHYSICIAN: Dr. [name]     DIAGNOSIS:  Stage I A (T1b N0 M0) invasive ductal/DCIS of the right breast (triple negative)  INDICATION FOR TREATMENT: Curative   TREATMENT DATES: 03/11/2015 through 04/25/2015                          SITE/DOSE:   Right breast 4500 cGy in 25 sessions, right breast boost 1600 cGy in 8 sessions                         BEAMS/ENERGY:   Tangential fields with mixed 6 MV/10 MV photons to the right breast, 12 MEV electrons delivered en face for her right breast boost                NARRATIVE:   She tolerated treatment well with only focal area of moist desquamation along the mid right inframammary region by completion of therapy.  She had the expected degree of hyperpigmentation the skin and patchy dry desquamation.                         PLAN: Routine followup in one month. Patient instructed to call if questions or worsening complaints in interim.

## 2015-05-02 ENCOUNTER — Other Ambulatory Visit: Payer: Self-pay | Admitting: Nurse Practitioner

## 2015-05-02 ENCOUNTER — Other Ambulatory Visit: Payer: Self-pay | Admitting: *Deleted

## 2015-05-06 ENCOUNTER — Other Ambulatory Visit: Payer: Self-pay | Admitting: Radiation Oncology

## 2015-05-06 DIAGNOSIS — C50311 Malignant neoplasm of lower-inner quadrant of right female breast: Secondary | ICD-10-CM

## 2015-05-09 ENCOUNTER — Telehealth: Payer: Self-pay | Admitting: *Deleted

## 2015-05-09 NOTE — Telephone Encounter (Signed)
CALLED PATIENT TO INFORM OF APPT. FOR SURVIVORSHIP PROGRAM ON 05-15-15 @ 10 AM , SPOKE WITH PATIENT AND SHE IS AWARE OF THIS APPT.

## 2015-05-15 ENCOUNTER — Encounter: Payer: Self-pay | Admitting: Nurse Practitioner

## 2015-05-15 ENCOUNTER — Ambulatory Visit (HOSPITAL_BASED_OUTPATIENT_CLINIC_OR_DEPARTMENT_OTHER): Payer: Medicare PPO | Admitting: Nurse Practitioner

## 2015-05-15 VITALS — BP 104/61 | HR 88 | Temp 97.9°F | Resp 16 | Ht 61.5 in | Wt 224.5 lb

## 2015-05-15 DIAGNOSIS — C50311 Malignant neoplasm of lower-inner quadrant of right female breast: Secondary | ICD-10-CM

## 2015-05-15 NOTE — Progress Notes (Signed)
CLINIC:  Cancer Survivorship   REASON FOR VISIT:  Routine follow-up post-treatment for a recent history of breast cancer.  BRIEF ONCOLOGIC HISTORY:    Breast cancer of lower-inner quadrant of right female breast   09/04/2014 Mammogram Right breast: possible mass warrants further evaluation with spot compression views and possibly ultrasound   09/11/2014 Breast US Right breast: small hypoechoic mass (5 x 5 x 4 mm in size).with low-level internal echoes located within the medial aspect at 3:30 o'clock position,  4 cm from nipple    09/24/2014 Initial Biopsy Right breast needle core biopsy (LIQ mass): Invasive ductal carcinoma, ER- (0%), PR- (0%), HER2/neu negative by FISH, Ki67 41%,  DCIS   10/03/2014 Procedure Genetic testing OvaNext panel Cephus Shelling) reveals VUS at ATM (p.L2330V), and SMARCA4 (p.V1404G), otherwise neg at BARD1, BRCA1, BRCA2, BRIP1, CDH1, CHEK2, EPCAM, MLH1, MRE11A, MSH2, MSH6, MUTYH, NBN, NF1, PALB2, PMS2, PTEN, RAD50, RAD51C, RAD51D, STK11, TP53   10/04/2014 Breast MRI Post biopsy change, right breast lower inner quadrant, adjacent clip artifact at the location of biopsy proven breast cancer. No MRI evidence for multifocal/ multicentric or contralateral malignancy.   10/04/2014 Clinical Stage Stage IA: T1a N0   10/17/2014 Definitive Surgery Right lumpectomy/SLNB Marlou Starks): Invasive ductal carcinoma, no LVI, HER2/neu repeated and remains negative (ratio 0.94), 2 sentinel LN removed and negative for malignancy (0/2)   10/17/2014 Pathologic Stage Stage IA: pT1b pN0   12/09/2014 - 02/10/2015 Chemotherapy Adjuvant chemotherapy with cyclophosphamide and docetaxel x 4 cycles   03/11/2015 - 04/25/2015 Radiation Therapy Adjuvant RT Valere Dross) completed: Right breast 45 Gy in 25 sessions, right breast boost 16 Gy in 8 sessions; total dose 60 Gy    Colorectal cancer   10/03/2014 Initial Diagnosis Colorectal cancer    INTERVAL HISTORY:  Ms. Dingley presents to the [facility] today for our initial  meeting to review her survivorship care plan detailing her treatment course for breast cancer, as well as monitoring long-term side effects of that treatment, education regarding health maintenance, screening, and overall wellness and health promotion.     Overall, Ms. Wandrey reports feeling quite well since completing her radiation therapy approximately 3 weeks ago.  She reports that the skin redness over her right breast has improved. She continues with significant fatigue, particularly when performing activities outside the house, but this is no worse since completing radiation.  She denies any pain or mass in her breast.  She does have pain in her right hand and arm where she fell in the shower in the last few weeks and continues to wear a brace.  She does have pain related to her arthritis, but this is no worse. She has a good appetite and denies weight loss.  She has had no cough, shortness of breath, or headache.  She continues with changes in her nails related to her docetaxel, but no other lingering symptoms post chemotherapy. Ms. Mannor is scheduled to see Dr. Valere Dross for follow up in October 2016 and Gentry Fitz NP in follow up in November 2016.    REVIEW OF SYSTEMS:  General: Denies fever, chills, or night sweats. Cardiac: Denies palpitations, chest pain, and lower extremity edema.  Respiratory: Denies dyspnea on exertion.  GI: Denies abdominal pain, constipation, diarrhea, nausea, or vomiting.  GU: Denies dysuria, hematuria, vaginal bleeding, vaginal discharge, or vaginal dryness.  Musculoskeletal: Pain as above.  Neuro: Denies peripheral neuropathy. Skin: Denies rash, pruritis, or open wounds.  Breast: Denies any new nodularity, masses, tenderness, nipple changes, or nipple discharge.  Psych: Denies depression, anxiety, insomnia, or memory loss.   A 14-point review of systems was completed and was negative, except as noted above.   ONCOLOGY TREATMENT TEAM:  1. Surgeon:  Dr.  Marlou Starks at [facility]  2. Medical Oncologist: Dr. Jana Hakim 3. Radiation Oncologist: Dr. Valere Dross    PAST MEDICAL/SURGICAL HISTORY:  Past Medical History  Diagnosis Date  . Hyperlipidemia     takes Zetia and Zocor daily  . Thyroid disease   . Nasal congestion   . Leg swelling   . Constipation   . Nausea   . Generalized headaches     due to allergies, sinus  . PONV (postoperative nausea and vomiting)     pt states she is very easy to sedate  . Hypertension     takes Maxzide and Metoprolol daily  . Pneumonia     walking about 6-72yr ago  . History of bronchitis     last time about 6-778yrago  . Hx of seasonal allergies     takes OTC allergy med nightly  . History of migraine     last one about 15+yrs ago  . Dizziness     r/t side effects from meds  . Vertigo     takes Meclizine prn  . Joint pain   . Back pain     buldging disc  . GERD (gastroesophageal reflux disease)     takes Protonix as needed  . Hemorrhoids   . History of colon polyps   . Mass of colon   . Cancer     colon  . History of UTI   . Hypothyroidism     takes Synthroid daily  . Depression     takes Wellbutrin daily  . Wears glasses   . Breast cancer 10/17/2014    lower inner quadrant of the right breast   Past Surgical History  Procedure Laterality Date  . Cesarean section  1968, 1970  . Knee surgery  1998    right - arthroscopic  . Abdominal hysterectomy    . Exploratory laparotomy    . Carpal tunnel release    . Colonoscopy    . Esophagogastroduodenoscopy    . Partial colectomy  03/30/2012    Procedure: PARTIAL COLECTOMY;  Surgeon: JaGwenyth OberMD;  [facility] Service: General;  Laterality: N/A;  . Appendectomy    . Radioactive seed guided mastectomy with axillary sentinel lymph node biopsy Right 10/17/2014    Procedure: RADIOACTIVE SEED GUIDED PARTIAL MASTECTOMY WITH AXILLARY SENTINEL LYMPH NODE BIOPSY;  Surgeon: PaAutumn MessingII, MD;  [facility]  Service: General;  Laterality: Right;     ALLERGIES:  No Known Allergies   CURRENT MEDICATIONS:  Current Outpatient Prescriptions on File Prior to Visit  Medication Sig Dispense Refill  . buPROPion (WELLBUTRIN XL) 150 MG 24 hr tablet Take 1 tablet (150 mg total) by mouth daily. 30 tablet 5  . ezetimibe (ZETIA) 10 MG tablet Take 1 tablet (10 mg total) by mouth daily. 30 tablet 5  . levothyroxine (SYNTHROID, LEVOTHROID) 125 MCG tablet TAKE 1 TABLET BY MOUTH DAILY 30 tablet 5  . loratadine (CLARITIN) 10 MG tablet Take 10 mg by mouth daily.  4  . LORazepam (ATIVAN) 0.5 MG tablet Take 1 tablet (0.5 mg total) by mouth at bedtime as needed for anxiety. 30 tablet 1  . meclizine (ANTIVERT) 25 MG tablet Take 1 tablet (25 mg total) by mouth 4 (four) times daily as  needed for dizziness. 30 tablet 1  . meloxicam (MOBIC) 15 MG tablet TAKE 1 TABLET EVERY DAY 30 tablet 3  . metoprolol succinate (TOPROL-XL) 25 MG 24 hr tablet Take 1 tablet (25 mg total) by mouth daily. 30 tablet 5  . nitroGLYCERIN (NITROSTAT) 0.4 MG SL tablet Place 1 tablet (0.4 mg total) under the tongue every 5 (five) minutes as needed for chest pain. 30 tablet 1  . Prenatal Multivit-Min-Fe-FA (PRENATAL VITAMINS PO) Take by mouth.    . rivaroxaban (XARELTO) 20 MG TABS tablet Take 1 tablet (20 mg total) by mouth daily with supper. (Patient not taking: Reported on 04/22/2015) 30 tablet 3  . simvastatin (ZOCOR) 40 MG tablet Take 1 tablet (40 mg total) by mouth at bedtime. 30 tablet 5  . triamterene-hydrochlorothiazide (MAXZIDE-25) 37.5-25 MG per tablet TAKE 1 TABLET DAILY (Patient taking differently: 1 tablet. TAKE 1 TABLET DAILY) 30 tablet 12   Current Facility-Administered Medications on File Prior to Visit  Medication Dose Route Frequency Provider Last Rate Last Dose  . chlorhexidine (HIBICLENS) 4 % liquid 1 application  1 application Topical Once [name], MD         ONCOLOGIC FAMILY HISTORY:  Family History  Problem Relation  Age of Onset  . Cancer Father 3    prostate  . Cancer Brother 37    colon  . Deep vein thrombosis Brother   . Cancer Maternal Aunt 65    breast  . Cancer Maternal Grandmother 17    breast  . Cancer Paternal Grandmother     colon  . Pulmonary embolism Brother     long-distance truck driver (PE x 2)  . Cancer Brother 65    colon  . Diabetes Maternal Grandfather   . Cancer Maternal Aunt 75    unknown type  . Mental illness Sister     history of Depression  . Seizures Sister 8    s/p traumatic brain injury  . Cancer Maternal Uncle     prostate     GENETIC COUNSELING/TESTING: OvaNext panel Cephus Shelling) performed 10/03/2014 reveals Variant of Unknown Significance at ATM (p.L2330V), and SMARCA4 (p.V1404G), otherwise no clinically significant variants at BARD1, BRCA1, BRCA2, BRIP1, CDH1, CHEK2, EPCAM, MLH1, MRE11A, MSH2, MSH6, MUTYH, NBN, NF1, PALB2, PMS2, PTEN, RAD50, RAD51C, RAD51D, STK11, or TP53.  SOCIAL HISTORY:  Odaliz CHERYLANNE ARDELEAN is married and lives with her husband and granddaughter in [place], [place].  She has 1 children who is deceased.  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  She denies any current or history of tobacco, alcohol, or illicit drug use.     PHYSICAL EXAMINATION:  Vital Signs:   Filed Vitals:   05/15/15 1141  BP: 104/61  Pulse: 88  Temp: 97.9 F (36.6 C)  Resp: 16   ECOG Performance Status: 1 General: Well-nourished, well-appearing female in no acute distress.  She is unaccompanied in clinic today.   HEENT: Head is atraumatic and normocephalic.  Pupils equal and reactive to light and accomodation. Conjunctivae clear without exudate.  Sclerae anicteric. Oral mucosa is pink, moist, and intact without lesions.  Oropharynx is pink without lesions or erythema.  Lymph: No cervical, supraclavicular, infraclavicular, or axillary lymphadenopathy noted on palpation.  Cardiovascular: Regular rate and rhythm without murmurs,  rubs, or gallops. Respiratory: Clear to auscultation bilaterally. Chest expansion symmetric without accessory muscle use on inspiration or expiration.  GI: Abdomen soft and round. No tenderness to palpation. Bowel sounds normoactive in 4 quadrants.  GU: Deferred.  Musculoskeletal: Splint at left wrist. Mild swelling in left hand. Limited ROM in left arm. Neuro: No focal deficits. Steady gait.  Psych: Mood and affect normal and appropriate for situation.  Extremities: Darkened nailbeds bilaterally in hands and feet.  No cyanosis or clubbing.  Skin: Warm and dry. No open lesions noted.   LABORATORY DATA:  None for this visit.  DIAGNOSTIC IMAGING:  None for this visit.     ASSESSMENT AND PLAN:   1. History of breast cancer: Stage IA Invasive ductal carcinoma of the right breast, triple negative, S/P lumpectomy, adjuvant chemotherapy with docetaxel and cyclophosphamide followed by adjuvant radiation therapy, as above, now in a program of survillance.  Ms. Massar is doing well with no clinical symptoms of recurrence at this time.  I believe that her fatigue is most likely related to her recent radiation therapy, and will continue to slowly improve.  I have encouraged her to begin regular exercise - beginning with 5 minutes a day/ building up to 30 minutes per day - to help with these symptoms.  She will follow-up with Gentry Fitz NP in November as well as Dr. Valere Dross in Radiation in October 2016 with history and physical exam per surveillance protocol.  A comprehensive survivorship care plan and treatment summary was reviewed with the patient today detailing her breast cancer diagnosis, treatment course, potential late/long-term effects of treatment, appropriate follow-up care with recommendations for the future, and patient education resources.  A copy of this summary, along with a letter will be sent to the patient's primary care provider via in basket message after today's visit.  Ms. Heather is  welcome to return to the Survivorship Clinic in the future, as needed; no follow-up will be scheduled at this time.    2. Cancer screening:  Due to Ms. Leazer's history and her age, she should receive screening for skin cancers and gynecologic cancers. Based on her history of colorectal cancer, she should continue surveillance colonoscopy at intervals per her gastroenterology.  She says that her next colonscopy is due in 2021.  The information and recommendations are listed on the patient's comprehensive care plan/treatment summary and were reviewed in detail with the patient.    3. Health maintenance and wellness promotion: Ms. Schaffer was encouraged to consume 5-7 servings of fruits and vegetables per day. We reviewed the "Nutrition Rainbow" handout, as well as the handout about "Nutrition for Breast Cancer Survivors."  She was also encouraged to build up to being able to engage in moderate to vigorous exercise for 30 minutes per day most days of the week. We discussed the [employer] fitness program, which is designed for cancer survivors to help them become more physically fit after cancer treatments.  She was instructed to limit her alcohol consumption and continue to abstain from tobacco use.   4. Support services/counseling: It is not uncommon for this period of the patient's cancer care trajectory to be one of many emotions and stressors.  We discussed an opportunity for her to participate in the next session of [facility] ("Finding Your New Normal") support group series designed for patients after they have completed treatment.   Ms. Khachatryan was encouraged to take advantage of our many other support services programs, support groups, and/or counseling in coping with her new life as a cancer survivor after completing anti-cancer treatment.  She was offered support today through active listening and expressive supportive counseling.  She was given information regarding our available services and  encouraged  to contact me with any questions or for help enrolling in any of our support group/programs.    A total of 50 minutes of face-to-face time was spent with this patient with greater than 50% of that time in counseling and care-coordination.   Sylvan Cheese, NP  Survivorship Program [place] [phone]   Note: PRIMARY CARE PROVIDER [place], [place] [phone]

## 2015-05-26 ENCOUNTER — Other Ambulatory Visit: Payer: Self-pay | Admitting: Oncology

## 2015-05-26 ENCOUNTER — Encounter: Payer: Self-pay | Admitting: Radiation Oncology

## 2015-05-27 ENCOUNTER — Telehealth: Payer: Self-pay | Admitting: Physician Assistant

## 2015-05-27 ENCOUNTER — Ambulatory Visit
Admission: RE | Admit: 2015-05-27 | Discharge: 2015-05-27 | Disposition: A | Discharge status: Home / Self Care | Payer: Medicare PPO | Source: Ambulatory Visit | Attending: Radiation Oncology | Admitting: Radiation Oncology

## 2015-05-27 ENCOUNTER — Encounter: Payer: Self-pay | Admitting: Radiation Oncology

## 2015-05-27 VITALS — BP 94/59 | HR 79 | Temp 98.2°F | Ht 61.5 in | Wt 227.4 lb

## 2015-05-27 DIAGNOSIS — C50311 Malignant neoplasm of lower-inner quadrant of right female breast: Secondary | ICD-10-CM

## 2015-05-27 HISTORY — DX: Personal history of irradiation: Z92.3

## 2015-05-27 NOTE — Telephone Encounter (Signed)
Spoke with pt, advised message. 

## 2015-05-27 NOTE — Telephone Encounter (Signed)
Please call this patient.  I received a note from Dr. Valere Dross (Radiation oncologist) concerned that her blood pressure was too low.  When she was here in July, it was actually a little bit high, but since then it appears to be running on the low side, sometimes too low.  I advise that she stop the metoprolol succinate (Toprol XL). Next time her labs are drawn, I recommend a TSH (it was normal in 08/2014).  In addition, she needs a seasonal flu vaccine.

## 2015-05-27 NOTE — Progress Notes (Signed)
Jordan [name] main concern is lack of sleep.  Denies any issues in her prior tx field.

## 2015-05-27 NOTE — Progress Notes (Signed)
CC: Dr. [name]  Follow-up note:  [name] visits today approximately 1 month follow-up completion of radiation therapy following conservative surgery and adjuvant chemotherapy in the management of her T1b invasive ductal/DCIS of the right breast (triple negative).  Her major complaint today is that of fatigue and lightheadedness.  She tells me she is been having trouble sleeping for the past 2-3 weeks.  She is hypotensive today.  She is on metoprolol.  She is pleased with her cosmesis.  Physical examination: Alert and oriented. Filed Vitals:   05/27/15 1056  BP: 94/59  Pulse: 79  Temp: 98.2 F (36.8 C)   Nodes: There is no palpable cervical, supraclavicular, or axillary lymphadenopathy.  Breasts: There is residual hyperpigmentation the skin along the right breast with continued patchy dry desquamation.  There is moderate thickening of the inferior/ central breast as expected.  No dominant masses are appreciated.  Left breast without masses or lesions.  Extremities: Without edema.  Impression: Satisfactory progress from a breast cancer standpoint, but I am concerned about her hypotension.  I told her to call her primary care physician/nurse to see if she should at least temporarily stop her metoprolol.  She may also need to have a TSH to rule out hypothyroidism.  She should have repeat mammography by January 2017.  She tells me that she will see Dr. Marlou Starks for a follow-up visit in the near future and she will see medical oncology in late November.  Plan: As above.  I've not scheduled Mr. Jordan [name] for formal follow-up visit, but we will be more than happy to see her in the future should the need arise.

## 2015-06-05 ENCOUNTER — Encounter: Payer: Self-pay | Admitting: Physician Assistant

## 2015-06-05 ENCOUNTER — Ambulatory Visit (INDEPENDENT_AMBULATORY_CARE_PROVIDER_SITE_OTHER): Payer: Medicare PPO | Admitting: Emergency Medicine

## 2015-06-05 VITALS — BP 108/71 | HR 82 | Temp 98.3°F | Resp 16 | Ht 61.5 in | Wt 230.4 lb

## 2015-06-05 DIAGNOSIS — Z95828 Presence of other vascular implants and grafts: Secondary | ICD-10-CM | POA: Diagnosis not present

## 2015-06-05 DIAGNOSIS — I890 Lymphedema, not elsewhere classified: Secondary | ICD-10-CM | POA: Diagnosis not present

## 2015-06-05 DIAGNOSIS — I10 Essential (primary) hypertension: Secondary | ICD-10-CM | POA: Diagnosis not present

## 2015-06-05 DIAGNOSIS — Z23 Encounter for immunization: Secondary | ICD-10-CM | POA: Diagnosis not present

## 2015-06-05 NOTE — Patient Instructions (Signed)
Cut maxzide dose in half and take daily. Continue to take BP daily and record. Send recordings by mychart to either me or Chelle to determine if you can stay at half tab or if you need to go back to full tab. You will get phone call to make appt with Dr. Marlou Starks.

## 2015-06-05 NOTE — Progress Notes (Signed)
[facility] [address] [place] [address] [phone]  Date:  06/05/2015   Name:  Jordan [name]   DOB:  [birth year]   MRN:  [id number]  PCP:  JEFFERY,CHELLE, PA-C    Chief Complaint: follow up blood pressure and left arm and hand swelling   History of Present Illness:  This is a 64 y.o. female with PMH HTN, HLD, hypothyroidism, depression, left breast cancer s/p chemo and radiation, hx DVT of left IJ who is presenting for blood pressure recheck. She finished chemo in July. Finished radiation in September. When she was at her oncologist's clinic on 10/11 it was noted that her BP was 94/59. Harrison Mons, PA-C advised that she stop her metoprolol. She states she also stopped the maxzide for 3 days. She checked her BP a few times a day and noted a SBP of 160 so she went back on it. When she was seen at her oncs office 10/11 she was having dizziness. She states this has gotten much better but not completely resolved yet.  Pt is wondering about her left arm swelling that has been present x 3 months. I saw patient for this issue 8/9. She had a history of fall with impact to that arm a few week prior. Radiograph was negative. She had a left internal jugular DVT 11/2014. I sent her for left upper extremity duplex that was negative. She took xarelto for 6 months after DVT in April. She has finished treatment. She states her arm starts to hurt after she uses it. She has port placed in left chest, otherwise no other surgeries on left side. Lymph nodes were removed from right axilla. She is left hand dominant. She denies sob, wheezing.  Review of Systems:  Review of Systems See HPI  Patient Active Problem List   Diagnosis Date Noted  . Constipation 12/16/2014  . Chemotherapy-induced nausea 12/16/2014  . Hyperglycemia 12/09/2014  . Internal jugular vein thrombosis ([place])   . Leukocytosis   . DVT (deep venous thrombosis) ([place]) 12/02/2014  . Osteoarthritis of both feet  11/26/2014  . Poor venous access   . Colorectal cancer ([place]) 10/03/2014  . Breast cancer of lower-inner quadrant of right female breast ([place]) 09/27/2014  . Severe obesity (BMI >= 40) ([place]) 11/06/2013  . Anemia, iron deficiency 04/25/2013  . GERD (gastroesophageal reflux disease) 02/12/2013  . HTN (hypertension) 10/21/2011  . Hypothyroidism 10/21/2011  . Depression 10/21/2011  . Hyperlipidemia 10/21/2011  . Lung nodules 10/21/2011  . Vertigo 10/21/2011  . Vitamin D deficiency 10/21/2011   Prior to Admission medications   Medication Sig Start Date End Date Taking? Authorizing Provider  buPROPion (WELLBUTRIN XL) 150 MG 24 hr tablet Take 1 tablet (150 mg total) by mouth daily. 03/04/15  Yes Chelle Jeffery, PA-C  ezetimibe (ZETIA) 10 MG tablet Take 1 tablet (10 mg total) by mouth daily. 03/04/15  Yes Chelle Jeffery, PA-C  levothyroxine (SYNTHROID, LEVOTHROID) 125 MCG tablet TAKE 1 TABLET BY MOUTH DAILY 03/04/15  Yes Chelle Jeffery, PA-C  loratadine (CLARITIN) 10 MG tablet Take 10 mg by mouth daily. 03/13/15  Yes Historical Provider, MD  LORazepam (ATIVAN) 0.5 MG tablet TAKE 1 TABLET BY MOUTH EVERY DAY AT BEDTIME AS NEEDED 05/26/15  Yes Chauncey Cruel, MD  meclizine (ANTIVERT) 25 MG tablet Take 1 tablet (25 mg total) by mouth 4 (four) times daily as needed for dizziness. 08/20/14  Yes Chelle Jeffery, PA-C  meloxicam (MOBIC) 15 MG tablet TAKE 1 TABLET EVERY  DAY 05/02/15  Yes Chauncey Cruel, MD  nitroGLYCERIN (NITROSTAT) 0.4 MG SL tablet Place 1 tablet (0.4 mg total) under the tongue every 5 (five) minutes as needed for chest pain. 11/23/12  Yes Chelle Jeffery, PA-C  Prenatal Multivit-Min-Fe-FA (PRENATAL VITAMINS PO) Take by mouth.   Yes Historical Provider, MD  simvastatin (ZOCOR) 40 MG tablet Take 1 tablet (40 mg total) by mouth at bedtime. 03/04/15  Yes Chelle Jeffery, PA-C  triamterene-hydrochlorothiazide (MAXZIDE-25) 37.5-25 MG per tablet TAKE 1 TABLET DAILY Patient taking differently: 1  tablet. TAKE 1 TABLET DAILY 08/20/14  Yes Chelle Jeffery, PA-C         rivaroxaban (XARELTO) 20 MG TABS tablet Take 1 tablet (20 mg total) by mouth daily with supper. Patient not taking: Reported on 04/22/2015 12/30/14   Laurie Panda, NP    No Known Allergies  Past Surgical History  Procedure Laterality Date  . Cesarean section  1968, 1970  . Knee surgery  1998    right - arthroscopic  . Abdominal hysterectomy    . Exploratory laparotomy    . Carpal tunnel release    . Colonoscopy    . Esophagogastroduodenoscopy    . Partial colectomy  03/30/2012    Procedure: PARTIAL COLECTOMY;  Surgeon: [name], MD;  Location: [facility];  Service: General;  Laterality: N/A;  . Appendectomy    . Radioactive seed guided mastectomy with axillary sentinel lymph node biopsy Right 10/17/2014    Procedure: RADIOACTIVE SEED GUIDED PARTIAL MASTECTOMY WITH AXILLARY SENTINEL LYMPH NODE BIOPSY;  Surgeon: [name], MD;  Location: [facility];  Service: General;  Laterality: Right;    Social History  Substance Use Topics  . Smoking status: Never Smoker   . Smokeless tobacco: Never Used  . Alcohol Use: No    Family History  Problem Relation Age of Onset  . Cancer Father 56    prostate  . Cancer Brother 69    colon  . Deep vein thrombosis Brother   . Cancer Maternal Aunt 26    breast  . Cancer Maternal Grandmother 1    breast  . Cancer Paternal Grandmother     colon  . Pulmonary embolism Brother     long-distance truck driver (PE x 2)  . Cancer Brother 54    colon  . Diabetes Maternal Grandfather   . Cancer Maternal Aunt 75    unknown type  . Mental illness Sister     history of Depression  . Seizures Sister 8    s/p traumatic brain injury  . Cancer Maternal Uncle     prostate    Medication list has been reviewed and updated.  Physical Examination:  Physical Exam  Constitutional: She is oriented to person, place, and time. She appears well-developed and  well-nourished. No distress.  HENT:  Head: Normocephalic and atraumatic.  Right Ear: Hearing normal.  Left Ear: Hearing normal.  Nose: Nose normal.  Eyes: Conjunctivae and lids are normal. Right eye exhibits no discharge. Left eye exhibits no discharge. No scleral icterus.  Cardiovascular: Normal rate, regular rhythm, normal heart sounds and normal pulses.   No murmur heard. Pulmonary/Chest: Effort normal and breath sounds normal. No respiratory distress. She has no wheezes. She has no rhonchi. She has no rales.  Port in place left chest  Musculoskeletal: Normal range of motion.  Lymphadenopathy:    She has no cervical adenopathy.  Neurological: She is alert and oriented to person, place, and time.  Skin: Skin is warm, dry and intact.  Left upper arm, forearm and hand swelling. No pitting edema.  Psychiatric: She has a normal mood and affect. Her speech is normal and behavior is normal. Thought content normal.   BP 108/71 mmHg  Pulse 82  Temp(Src) 98.3 F (36.8 C) (Oral)  Resp 16  Ht 5' 1.5" (1.562 m)  Wt 230 lb 6.4 oz (104.509 kg)  BMI 42.83 kg/m2  Assessment and Plan:  1. Essential hypertension She has stopped metoprolol. She stopped maxzide for 3 days but noticed elevated SBP of 160. She went back on full maxzide dose and BP today 108/72. I have going to have pt take 1/2 tab maxzide and record BP readings. She will send mychart message to either Chelle or I and let us know her readings. Will determine at that time if she needs to go back to full take maxzide.  2. Lymphedema Suspect swelling in left arm d/t lymphedema, possibly d/t port placed and previous left IJ DVT. Referred to gen surg Dr. Marlou Starks for further eval. - Ambulatory referral to General Surgery  3. Portacath in place Referred to gen surg Dr. Marlou Starks for removal. - Ambulatory referral to General Surgery  4. Influenza vaccine needed - Flu Vaccine QUAD 36+ mos IM   Benjaman Pott. Drenda Freeze, MHS [facility]  06/05/2015

## 2015-06-10 ENCOUNTER — Other Ambulatory Visit: Payer: Self-pay | Admitting: General Surgery

## 2015-06-23 NOTE — Pre-Procedure Instructions (Signed)
    Pennelope SAYWARD HORVATH  06/23/2015      [facility] - [place], [place] [address] Phone: [phone] Fax: [phone]  [facility] [address] [place], [place] - [address] [place] [address] Phone: [phone] Fax: [phone]  [id number] - [place], [place] - [address] [facility] [address] [address] [place] [address] Phone: [phone] Fax: [phone]     Your procedure is scheduled on Wednesday, November 9.  Report to [facility] Admitting at 6:30A.M.  Call this number if you have problems the morning of surgery: [phone]   Remember:  Do not eat food or drink liquids after midnight.  Take these medicines the morning of surgery with A SIP OF WATER: buPROPion (WELLBUTRIN), ezetimibe (ZETIA), levothyroxine (SYNTHROID, LEVOTHROID).                    Take if needed:nitroGLYCERIN (NITROSTAT), diphenhydrAMINE (BENADRYL).                 Stop taking Aspirin and Meloxicam.   Do not wear jewelry, make-up or nail polish.   Do not wear lotions, powders, or perfumes.    Do not shave 48 hours prior to surgery.    Do not bring valuables to the hospital.    [facility] is not responsible for any belongings or valuables.  Contacts, dentures or bridgework may not be worn into surgery.  Leave your suitcase in the car.  After surgery it may be brought to your room.  For patients admitted to the hospital, discharge time will be determined by your treatment team.  Patients discharged the day of surgery will not be allowed to drive home.   Name and phone number of your driver:  -  Special instructions : Review  [facility] - Preparing For Surgery.  Please read over the following fact sheets that you were given. Pain Booklet, Coughing and Deep Breathing and Surgical Site Infection  Prevention

## 2015-06-24 ENCOUNTER — Encounter (HOSPITAL_COMMUNITY): Payer: Self-pay

## 2015-06-24 ENCOUNTER — Encounter (HOSPITAL_COMMUNITY)
Admission: RE | Admit: 2015-06-24 | Discharge: 2015-06-24 | Disposition: A | Discharge status: Home / Self Care | Payer: Medicare PPO | Source: Ambulatory Visit | Attending: General Surgery | Admitting: General Surgery

## 2015-06-24 DIAGNOSIS — I10 Essential (primary) hypertension: Secondary | ICD-10-CM | POA: Diagnosis not present

## 2015-06-24 DIAGNOSIS — E039 Hypothyroidism, unspecified: Secondary | ICD-10-CM | POA: Diagnosis not present

## 2015-06-24 DIAGNOSIS — Z9221 Personal history of antineoplastic chemotherapy: Secondary | ICD-10-CM | POA: Diagnosis not present

## 2015-06-24 DIAGNOSIS — Z452 Encounter for adjustment and management of vascular access device: Secondary | ICD-10-CM | POA: Diagnosis not present

## 2015-06-24 DIAGNOSIS — Z853 Personal history of malignant neoplasm of breast: Secondary | ICD-10-CM | POA: Diagnosis not present

## 2015-06-24 HISTORY — DX: Personal history of other diseases of the digestive system: Z87.19

## 2015-06-24 HISTORY — DX: Respiratory tuberculosis unspecified: A15.9

## 2015-06-24 HISTORY — DX: Unspecified osteoarthritis, unspecified site: M19.90

## 2015-06-24 LAB — CBC
HEMATOCRIT: 42 % (ref 36.0–46.0)
HEMOGLOBIN: 14.1 g/dL (ref 12.0–15.0)
MCH: 27.9 pg (ref 26.0–34.0)
MCHC: 33.6 g/dL (ref 30.0–36.0)
MCV: 83 fL (ref 78.0–100.0)
Platelets: 215 10*3/uL (ref 150–400)
RBC: 5.06 MIL/uL (ref 3.87–5.11)
RDW: 15.3 % (ref 11.5–15.5)
WBC: 6.9 10*3/uL (ref 4.0–10.5)

## 2015-06-24 LAB — BASIC METABOLIC PANEL
ANION GAP: 11 (ref 5–15)
BUN: 11 mg/dL (ref 6–20)
CALCIUM: 9 mg/dL (ref 8.9–10.3)
CHLORIDE: 107 mmol/L (ref 101–111)
CO2: 24 mmol/L (ref 22–32)
Creatinine, Ser: UNDETERMINED mg/dL (ref 0.44–1.00)
GLUCOSE: UNDETERMINED mg/dL (ref 65–99)
Potassium: 3.8 mmol/L (ref 3.5–5.1)
Sodium: 142 mmol/L (ref 135–145)

## 2015-06-24 NOTE — Anesthesia Preprocedure Evaluation (Addendum)
Anesthesia Evaluation  Patient identified by MRN, date of birth, ID band Patient awake    Reviewed: Allergy & Precautions, Patient's Chart, lab work & pertinent test results  History of Anesthesia Complications (+) PONV and history of anesthetic complications  Airway Mallampati: II  TM Distance: >3 FB Neck ROM: Full    Dental  (+) Teeth Intact, Dental Advisory Given   Pulmonary neg pulmonary ROS,    Pulmonary exam normal        Cardiovascular hypertension, + Peripheral Vascular Disease  Normal cardiovascular exam     Neuro/Psych  Headaches, PSYCHIATRIC DISORDERS Depression    GI/Hepatic Neg liver ROS, hiatal hernia, GERD  ,  Endo/Other  Hypothyroidism Morbid obesity  Renal/GU negative Renal ROS     Musculoskeletal   Abdominal   Peds  Hematology   Anesthesia Other Findings   Reproductive/Obstetrics                            Anesthesia Physical Anesthesia Plan  ASA: III  Anesthesia Plan: General   Post-op Pain Management:    Induction: Intravenous  Airway Management Planned: LMA  Additional Equipment:   Intra-op Plan:   Post-operative Plan: Extubation in OR  Informed Consent: I have reviewed the patients History and Physical, chart, labs and discussed the procedure including the risks, benefits and alternatives for the proposed anesthesia with the patient or authorized representative who has indicated his/her understanding and acceptance.   Dental advisory given  Plan Discussed with: CRNA, Anesthesiologist and Surgeon  Anesthesia Plan Comments:        Anesthesia Quick Evaluation

## 2015-06-25 ENCOUNTER — Encounter (HOSPITAL_COMMUNITY): Payer: Self-pay | Admitting: General Practice

## 2015-06-25 ENCOUNTER — Ambulatory Visit (HOSPITAL_COMMUNITY)
Admission: RE | Admit: 2015-06-25 | Discharge: 2015-06-25 | Disposition: A | Discharge status: Home / Self Care | Payer: Medicare PPO | Source: Ambulatory Visit | Attending: General Surgery | Admitting: General Surgery

## 2015-06-25 ENCOUNTER — Ambulatory Visit (HOSPITAL_COMMUNITY): Payer: Medicare PPO | Admitting: Anesthesiology

## 2015-06-25 ENCOUNTER — Encounter (HOSPITAL_COMMUNITY)
Admission: RE | Disposition: A | Discharge status: Home / Self Care | Payer: Self-pay | Source: Ambulatory Visit | Attending: General Surgery

## 2015-06-25 DIAGNOSIS — Z9221 Personal history of antineoplastic chemotherapy: Secondary | ICD-10-CM | POA: Diagnosis not present

## 2015-06-25 DIAGNOSIS — Z853 Personal history of malignant neoplasm of breast: Secondary | ICD-10-CM | POA: Diagnosis not present

## 2015-06-25 DIAGNOSIS — E039 Hypothyroidism, unspecified: Secondary | ICD-10-CM | POA: Insufficient documentation

## 2015-06-25 DIAGNOSIS — Z452 Encounter for adjustment and management of vascular access device: Secondary | ICD-10-CM | POA: Insufficient documentation

## 2015-06-25 DIAGNOSIS — I10 Essential (primary) hypertension: Secondary | ICD-10-CM | POA: Insufficient documentation

## 2015-06-25 DIAGNOSIS — K219 Gastro-esophageal reflux disease without esophagitis: Secondary | ICD-10-CM | POA: Diagnosis not present

## 2015-06-25 DIAGNOSIS — M1990 Unspecified osteoarthritis, unspecified site: Secondary | ICD-10-CM | POA: Diagnosis not present

## 2015-06-25 HISTORY — PX: PORT-A-CATH REMOVAL: SHX5289

## 2015-06-25 LAB — BASIC METABOLIC PANEL
ANION GAP: 10 (ref 5–15)
BUN: 13 mg/dL (ref 6–20)
CO2: 24 mmol/L (ref 22–32)
Calcium: 8.8 mg/dL — ABNORMAL LOW (ref 8.9–10.3)
Chloride: 107 mmol/L (ref 101–111)
Creatinine, Ser: 1 mg/dL (ref 0.44–1.00)
GFR calc Af Amer: >60 mL/min (ref >60)
GFR, EST NON AFRICAN AMERICAN: 57 mL/min — AB (ref >60)
GLUCOSE: 105 mg/dL — AB (ref 65–99)
POTASSIUM: 3.8 mmol/L (ref 3.5–5.1)
Sodium: 141 mmol/L (ref 135–145)

## 2015-06-25 SURGERY — REMOVAL PORT-A-CATH
Anesthesia: General | Site: Chest

## 2015-06-25 MED ORDER — ONDANSETRON HCL 4 MG/2ML IJ SOLN
INTRAMUSCULAR | Status: DC | PRN
  Administered 2015-06-25: 4 mg via INTRAVENOUS

## 2015-06-25 MED ORDER — EPHEDRINE SULFATE 50 MG/ML IJ SOLN
INTRAMUSCULAR | Status: AC
  Filled 2015-06-25: qty 1

## 2015-06-25 MED ORDER — LIDOCAINE HCL (CARDIAC) 20 MG/ML IV SOLN
INTRAVENOUS | Status: DC | PRN
  Administered 2015-06-25: 80 mg via INTRAVENOUS

## 2015-06-25 MED ORDER — FENTANYL CITRATE (PF) 100 MCG/2ML IJ SOLN
INTRAMUSCULAR | Status: DC | PRN
  Administered 2015-06-25: 100 ug via INTRAVENOUS

## 2015-06-25 MED ORDER — SODIUM CHLORIDE 0.9 % IJ SOLN
INTRAMUSCULAR | Status: AC
Start: 2015-06-25 — End: 2015-06-25
  Filled 2015-06-25: qty 10

## 2015-06-25 MED ORDER — ONDANSETRON HCL 4 MG/2ML IJ SOLN
INTRAMUSCULAR | Status: AC
  Filled 2015-06-25: qty 2

## 2015-06-25 MED ORDER — LACTATED RINGERS IV SOLN
INTRAVENOUS | Status: DC | PRN
  Administered 2015-06-25: 08:00:00 via INTRAVENOUS

## 2015-06-25 MED ORDER — 0.9 % SODIUM CHLORIDE (POUR BTL) OPTIME
TOPICAL | Status: DC | PRN
  Administered 2015-06-25: 1000 mL

## 2015-06-25 MED ORDER — LIDOCAINE-EPINEPHRINE (PF) 1 %-1:200000 IJ SOLN
INTRAMUSCULAR | Status: DC | PRN
  Administered 2015-06-25: 30 mL

## 2015-06-25 MED ORDER — PHENYLEPHRINE 40 MCG/ML (10ML) SYRINGE FOR IV PUSH (FOR BLOOD PRESSURE SUPPORT)
PREFILLED_SYRINGE | INTRAVENOUS | Status: AC
  Filled 2015-06-25: qty 10

## 2015-06-25 MED ORDER — SUCCINYLCHOLINE CHLORIDE 20 MG/ML IJ SOLN
INTRAMUSCULAR | Status: AC
  Filled 2015-06-25: qty 1

## 2015-06-25 MED ORDER — PROPOFOL 10 MG/ML IV BOLUS
INTRAVENOUS | Status: AC
  Filled 2015-06-25: qty 20

## 2015-06-25 MED ORDER — SCOPOLAMINE 1 MG/3DAYS TD PT72
MEDICATED_PATCH | TRANSDERMAL | Status: AC
  Administered 2015-06-25: 1 via TRANSDERMAL
  Filled 2015-06-25: qty 1

## 2015-06-25 MED ORDER — SODIUM CHLORIDE 0.9 % IJ SOLN
INTRAMUSCULAR | Status: AC
  Filled 2015-06-25: qty 10

## 2015-06-25 MED ORDER — FENTANYL CITRATE (PF) 250 MCG/5ML IJ SOLN
INTRAMUSCULAR | Status: AC
  Filled 2015-06-25: qty 5

## 2015-06-25 MED ORDER — HYDROMORPHONE HCL 1 MG/ML IJ SOLN: 0.2500 mg | INTRAMUSCULAR | Status: DC | PRN

## 2015-06-25 MED ORDER — CHLORHEXIDINE GLUCONATE 4 % EX LIQD: 1.0000 "application " | Freq: Once | CUTANEOUS | Status: DC

## 2015-06-25 MED ORDER — OXYCODONE-ACETAMINOPHEN 5-325 MG PO TABS
ORAL_TABLET | ORAL | Status: AC
  Filled 2015-06-25: qty 1

## 2015-06-25 MED ORDER — OXYCODONE-ACETAMINOPHEN 5-325 MG PO TABS: 1.0000 | ORAL_TABLET | ORAL | Status: DC | PRN

## 2015-06-25 MED ORDER — OXYCODONE-ACETAMINOPHEN 5-325 MG PO TABS
1.0000 | ORAL_TABLET | ORAL | Status: AC | PRN
  Administered 2015-06-25: 1 via ORAL

## 2015-06-25 MED ORDER — PROPOFOL 10 MG/ML IV BOLUS
INTRAVENOUS | Status: DC | PRN
  Administered 2015-06-25: 160 mg via INTRAVENOUS

## 2015-06-25 MED ORDER — PHENYLEPHRINE HCL 10 MG/ML IJ SOLN
INTRAMUSCULAR | Status: DC | PRN
  Administered 2015-06-25: 80 ug via INTRAVENOUS

## 2015-06-25 MED ORDER — ROCURONIUM BROMIDE 50 MG/5ML IV SOLN
INTRAVENOUS | Status: AC
  Filled 2015-06-25: qty 1

## 2015-06-25 MED ORDER — MIDAZOLAM HCL 5 MG/5ML IJ SOLN
INTRAMUSCULAR | Status: DC | PRN
  Administered 2015-06-25: 1 mg via INTRAVENOUS

## 2015-06-25 MED ORDER — LIDOCAINE HCL (CARDIAC) 20 MG/ML IV SOLN
INTRAVENOUS | Status: AC
  Filled 2015-06-25: qty 5

## 2015-06-25 MED ORDER — MIDAZOLAM HCL 2 MG/2ML IJ SOLN
INTRAMUSCULAR | Status: AC
  Filled 2015-06-25: qty 4

## 2015-06-25 MED ORDER — PROMETHAZINE HCL 25 MG/ML IJ SOLN: 6.2500 mg | INTRAMUSCULAR | Status: DC | PRN

## 2015-06-25 MED ORDER — LIDOCAINE-EPINEPHRINE (PF) 1 %-1:200000 IJ SOLN
INTRAMUSCULAR | Status: AC
  Filled 2015-06-25: qty 30

## 2015-06-25 SURGICAL SUPPLY — 29 items
CHLORAPREP W/TINT 10.5 ML (MISCELLANEOUS) ×2 IMPLANT
COVER SURGICAL LIGHT HANDLE (MISCELLANEOUS) ×2 IMPLANT
DRAPE PED LAPAROTOMY (DRAPES) ×2 IMPLANT
DRAPE UTILITY XL STRL (DRAPES) ×2 IMPLANT
ELECT CAUTERY BLADE 6.4 (BLADE) ×2 IMPLANT
ELECT REM PT RETURN 9FT ADLT (ELECTROSURGICAL) ×2
ELECTRODE REM PT RTRN 9FT ADLT (ELECTROSURGICAL) ×1 IMPLANT
GAUZE SPONGE 4X4 16PLY XRAY LF (GAUZE/BANDAGES/DRESSINGS) ×2 IMPLANT
GLOVE BIO SURGEON STRL SZ7.5 (GLOVE) ×2 IMPLANT
GLOVE BIOGEL PI IND STRL 7.0 (GLOVE) IMPLANT
GLOVE BIOGEL PI INDICATOR 7.0 (GLOVE) ×3
GLOVE ECLIPSE 6.5 STRL STRAW (GLOVE) ×2 IMPLANT
GOWN STRL REUS W/ TWL LRG LVL3 (GOWN DISPOSABLE) ×2 IMPLANT
GOWN STRL REUS W/TWL LRG LVL3 (GOWN DISPOSABLE) ×4
KIT BASIN OR (CUSTOM PROCEDURE TRAY) ×2 IMPLANT
KIT ROOM TURNOVER OR (KITS) ×2 IMPLANT
LIQUID BAND (GAUZE/BANDAGES/DRESSINGS) ×2 IMPLANT
NDL HYPO 25GX1X1/2 BEV (NEEDLE) ×1 IMPLANT
NEEDLE HYPO 25GX1X1/2 BEV (NEEDLE) ×2 IMPLANT
NS IRRIG 1000ML POUR BTL (IV SOLUTION) ×2 IMPLANT
PACK SURGICAL SETUP 50X90 (CUSTOM PROCEDURE TRAY) ×2 IMPLANT
PAD ARMBOARD 7.5X6 YLW CONV (MISCELLANEOUS) ×4 IMPLANT
PENCIL BUTTON HOLSTER BLD 10FT (ELECTRODE) ×2 IMPLANT
SUT MNCRL AB 4-0 PS2 18 (SUTURE) ×2 IMPLANT
SUT VIC AB 3-0 SH 27 (SUTURE) ×2
SUT VIC AB 3-0 SH 27X BRD (SUTURE) ×1 IMPLANT
SYR CONTROL 10ML LL (SYRINGE) ×2 IMPLANT
TOWEL OR 17X24 6PK STRL BLUE (TOWEL DISPOSABLE) ×2 IMPLANT
TOWEL OR 17X26 10 PK STRL BLUE (TOWEL DISPOSABLE) ×2 IMPLANT

## 2015-06-25 NOTE — Addendum Note (Signed)
Addendum  created 06/25/15 1031 by Duane Boston, MD   Modules edited: Orders, PRL Based Order Sets

## 2015-06-25 NOTE — Anesthesia Procedure Notes (Signed)
Procedure Name: LMA Insertion Date/Time: 06/25/2015 8:39 AM Performed by: Susa Loffler Pre-anesthesia Checklist: Patient identified, Timeout performed, Emergency Drugs available, Suction available and Patient being monitored Patient Re-evaluated:Patient Re-evaluated prior to inductionOxygen Delivery Method: Circle system utilized Preoxygenation: Pre-oxygenation with 100% oxygen Intubation Type: IV induction LMA: LMA with gastric port inserted LMA Size: 4.0 Number of attempts: 1 Placement Confirmation: positive ETCO2 and breath sounds checked- equal and bilateral Tube secured with: Tape Dental Injury: Teeth and Oropharynx as per pre-operative assessment

## 2015-06-25 NOTE — Op Note (Signed)
06/25/2015  9:06 AM  PATIENT:  Jordan [name]  64 y.o. female  PRE-OPERATIVE DIAGNOSIS:  HISTORY OF BREAST CANCER  POST-OPERATIVE DIAGNOSIS:  HISTORY OF BREAST CANCER  PROCEDURE:  Procedure(s): REMOVAL PORT-A-CATH (N/A)  SURGEON:  Surgeon(s) and Role:    * Jovita Kussmaul, MD - Primary  PHYSICIAN ASSISTANT:   ASSISTANTS: none   ANESTHESIA:   general  EBL:  Total I/O In: 500 [I.V.:500] Out: 5 [Blood:5]  BLOOD ADMINISTERED:none  DRAINS: none   LOCAL MEDICATIONS USED:  Lidocaine    SPECIMEN:  No Specimen  DISPOSITION OF SPECIMEN:  N/A  COUNTS:  YES  TOURNIQUET:  * No tourniquets in log *  DICTATION: .Dragon Dictation   After informed consent was obtained the patient was brought to the operating room and placed in the supine position on the operating room table. After adequate induction of general anesthesia the patient's left chest and neck area were prepped with ChloraPrep, allowed to dry, and draped in usual sterile manner. The area around the port was infiltrated with 1% lidocaine with epinephrine. A small incision was made through her old incision with a 10 blade knife. The incision was carried through the subcutaneous tissue bluntly with a hemostat until the port was identified. The capsule surrounding the port opened easily. There were no identified anchoring stitches. The port was gently pushed out of its pocket and with gentle traction was removed from the patient without difficulty. Pressure was held for several minutes until the area was completely hemostatic. The deep layer of the wound was then closed with interrupted 3-0 Vicryl stitches. The skin was then closed with a running 4-0 Monocryl subcuticular stitch. Dermabond dressings were applied. The patient tolerated the procedure well. At the end of the case all needle sponge and instrument counts were correct. The patient was then awakened and taken to recovery in stable condition.  PLAN OF CARE: Discharge to home  after PACU  PATIENT DISPOSITION:  PACU - hemodynamically stable.   Delay start of Pharmacological VTE agent (>24hrs) due to surgical blood loss or risk of bleeding: not applicable

## 2015-06-25 NOTE — Transfer of Care (Signed)
Immediate Anesthesia Transfer of Care Note  Patient: Jordan [name]  Procedure(s) Performed: Procedure(s): REMOVAL PORT-A-CATH (N/A)  Patient Location: PACU  Anesthesia Type:General  Level of Consciousness: awake, alert  and oriented  Airway & Oxygen Therapy: Patient Spontanous Breathing and Patient connected to nasal cannula oxygen  Post-op Assessment: Report given to RN and Post -op Vital signs reviewed and stable  Post vital signs: Reviewed and stable  Last Vitals:  Filed Vitals:   06/25/15 0914  BP:   Pulse:   Temp: 36.5 C  Resp:     Complications: No apparent anesthesia complications

## 2015-06-25 NOTE — Interval H&P Note (Signed)
History and Physical Interval Note:  06/25/2015 7:18 AM  Jordan [name]  has presented today for surgery, with the diagnosis of HISTORY OF BREAST CANCER  The various methods of treatment have been discussed with the patient and family. After consideration of risks, benefits and other options for treatment, the patient has consented to  Procedure(s): REMOVAL PORT-A-CATH (N/A) as a surgical intervention .  The patient's history has been reviewed, patient examined, no change in status, stable for surgery.  I have reviewed the patient's chart and labs.  Questions were answered to the patient's satisfaction.     TOTH III,PAUL S

## 2015-06-25 NOTE — Anesthesia Postprocedure Evaluation (Signed)
Anesthesia Post Note  Patient: Jordan [name]  Procedure(s) Performed: Procedure(s) (LRB): REMOVAL PORT-A-CATH (N/A)  Anesthesia type: general  Patient location: PACU  Post pain: Pain level controlled  Post assessment: Patient's Cardiovascular Status Stable  Last Vitals:  Filed Vitals:   06/25/15 1015  BP: 120/67  Pulse: 76  Temp: 36.6 C  Resp: 15    Post vital signs: Reviewed and stable  Level of consciousness: sedated  Complications: No apparent anesthesia complications

## 2015-06-25 NOTE — H&P (Signed)
Jordan [name] 01/28/2015 10:58 AM Location: [facility] Patient #: [id number] DOB: 1951/03/11 Married / Language: English / Race: Black or African American Female   History of Present Illness Jordan [name]. Jordan Starks MD; 01/29/2015 3:40 PM) Patient words: f/u breast.  The patient is a 64 year old female who presents for a follow-up for Breast cancer. The patient is a 64 year old black female who is 4 months status post right lumpectomy and sentinel node biopsy for a T1b N0 breast cancer. She was a triple negative with a Ki-67 of 41%. Her Port-A-Cath was placed by interventional radiology and she has been very unhappy with it. It has not worked well and most of her chemotherapy has gone through her arm. She has 1 more chemotherapy treatment to go. She would like to have me remove the port once it is no longer needed. Otherwise she has no complaints.   Allergies (Ammie Eversole, LPN; 9/51/8841 66:06 AM) No Known Drug Allergies03/17/2016  Medication History (Ammie Eversole, LPN; 10/14/6008 93:23 AM) Oxycodone-Acetaminophen (5-325MG Tablet, Oral) Active. BuPROPion HCl ER (XL) (150MG Tablet ER 24HR, Oral) Active. Levothyroxine Sodium (125MCG Tablet, Oral) Active. Meclizine HCl (25MG Tablet, Oral) Active. Meloxicam (15MG Tablet, Oral) Active. Metoprolol Succinate ER (25MG Tablet ER 24HR, Oral) Active. Simvastatin (40MG Tablet, Oral) Active. Triamterene-HCTZ (37.5-25MG Tablet, Oral) Active. Zetia (10MG Tablet, Oral) Active. Nitroglycerin (0.4MG Tab Sublingual, Sublingual) Active. Aspirin (81MG Tablet, Oral) Active.    Review of Systems Jordan Dibbles S. Jordan Starks MD; 01/29/2015 3:40 PM) General Not Present- Appetite Loss, Chills, Fatigue, Fever, Night Sweats, Weight Gain and Weight Loss. Skin Not Present- Change in Wart/Mole, Dryness, Hives, Jaundice, New Lesions, Non-Healing Wounds, Rash and Ulcer. HEENT Present- Wears glasses/contact lenses. Not Present- Earache, Hearing Loss,  Hoarseness, Nose Bleed, Oral Ulcers, Ringing in the Ears, Seasonal Allergies, Sinus Pain, Sore Throat, Visual Disturbances and Yellow Eyes. Respiratory Not Present- Bloody sputum, Chronic Cough, Difficulty Breathing, Snoring and Wheezing. Breast Present- Breast Mass. Not Present- Breast Pain, Nipple Discharge and Skin Changes. Cardiovascular Present- Leg Cramps. Not Present- Chest Pain, Difficulty Breathing Lying Down, Palpitations, Rapid Heart Rate, Shortness of Breath and Swelling of Extremities. Gastrointestinal Present- Abdominal Pain and Constipation. Not Present- Bloating, Bloody Stool, Change in Bowel Habits, Chronic diarrhea, Difficulty Swallowing, Excessive gas, Gets full quickly at meals, Hemorrhoids, Indigestion, Nausea, Rectal Pain and Vomiting. Female Genitourinary Not Present- Frequency, Nocturia, Painful Urination, Pelvic Pain and Urgency. Musculoskeletal Present- Back Pain, Joint Pain and Muscle Pain. Not Present- Joint Stiffness, Muscle Weakness and Swelling of Extremities. Neurological Not Present- Decreased Memory, Fainting, Headaches, Numbness, Seizures, Tingling, Tremor, Trouble walking and Weakness. Psychiatric Present- Anxiety and Change in Sleep Pattern. Not Present- Bipolar, Depression, Fearful and Frequent crying. Endocrine Not Present- Cold Intolerance, Excessive Hunger, Hair Changes, Heat Intolerance, Hot flashes and New Diabetes. Hematology Present- Easy Bruising. Not Present- Excessive bleeding, Gland problems, HIV and Persistent Infections.  Vitals (Ammie Eversole LPN; 5/57/3220 25:42 AM) 01/28/2015 10:58 AM Weight: 224.2 lb Height: 61.5in Body Surface Area: 2.1 m Body Mass Index: 41.68 kg/m  Temp.: 98.36F(Oral)  Pulse: 70 (Regular)  BP: 110/72 (Sitting, Left Arm, Standard)     Physical Exam Jordan Dibbles S. Jordan Starks MD; 01/29/2015 3:40 PM) General Mental Status-Alert. General Appearance-Consistent with stated age. Hydration-Well  hydrated. Voice-Normal.  Head and Neck Head-normocephalic, atraumatic with no lesions or palpable masses. Trachea-midline. Thyroid Gland Characteristics - normal size and consistency.  Eye Eyeball - Bilateral-Extraocular movements intact. Sclera/Conjunctiva - Bilateral-No scleral icterus.  Chest and Lung Exam Chest and lung exam reveals -quiet, even  and easy respiratory effort with no use of accessory muscles and on auscultation, normal breath sounds, no adventitious sounds and normal vocal resonance. Inspection Chest Wall - Normal. Back - normal.  Breast Note: The incision on the right breast and axilla are healing nicely with no sign of infection or significant seroma. There is no palpable mass in either breast. There is no palpable axillary, supraclavicular, or cervical lymphadenopathy.   Cardiovascular Cardiovascular examination reveals -normal heart sounds, regular rate and rhythm with no murmurs and normal pedal pulses bilaterally.  Abdomen Inspection Inspection of the abdomen reveals - No Hernias. Skin - Scar - no surgical scars. Palpation/Percussion Palpation and Percussion of the abdomen reveal - Soft, Non Tender, No Rebound tenderness, No Rigidity (guarding) and No hepatosplenomegaly. Auscultation Auscultation of the abdomen reveals - Bowel sounds normal.  Neurologic Neurologic evaluation reveals -alert and oriented x 3 with no impairment of recent or remote memory. Mental Status-Normal.  Musculoskeletal Normal Exam - Left-Upper Extremity Strength Normal and Lower Extremity Strength Normal. Normal Exam - Right-Upper Extremity Strength Normal and Lower Extremity Strength Normal.  Lymphatic Head & Neck  General Head & Neck Lymphatics: Bilateral - Description - Normal. Axillary  General Axillary Region: Bilateral - Description - Normal. Tenderness - Non Tender. Femoral & Inguinal  Generalized Femoral & Inguinal Lymphatics: Bilateral -  Description - Normal. Tenderness - Non Tender.    Assessment & Plan Jordan Dibbles S. Jordan Starks MD; 01/28/2015 11:16 AM) PRIMARY CANCER OF LOWER-INNER QUADRANT OF RIGHT FEMALE BREAST (174.3  C50.311) Impression: The patient is about 4 months status post right lumpectomy for breast cancer. She is almost done with her chemotherapy treatment. This will be followed by radiation therapy. She will continue to do regular self exams. I will plan to see her back in 6 months. She would also like me to remove her Port-A-Cath even though was put in by radiology. I have discussed with her in detail the risks and benefits of the operation to remove the port as well as some of the technical aspects and she understands. I will wait for a note from Dr. Jana Hakim before scheduling Current Plans Follow up in 6 months or as needed    Signed by Luella Cook, MD (01/29/2015 3:41 PM)

## 2015-06-26 ENCOUNTER — Encounter (HOSPITAL_COMMUNITY): Payer: Self-pay | Admitting: General Surgery

## 2015-07-11 ENCOUNTER — Other Ambulatory Visit: Payer: Self-pay | Admitting: *Deleted

## 2015-07-11 MED ORDER — MELOXICAM 15 MG PO TABS: 15.0000 mg | ORAL_TABLET | Freq: Every day | ORAL | Status: DC

## 2015-07-14 ENCOUNTER — Other Ambulatory Visit: Payer: Self-pay

## 2015-07-14 DIAGNOSIS — I10 Essential (primary) hypertension: Secondary | ICD-10-CM

## 2015-07-14 MED ORDER — TRIAMTERENE-HCTZ 37.5-25 MG PO TABS: ORAL_TABLET | ORAL | Status: DC

## 2015-07-14 NOTE — Telephone Encounter (Addendum)
Pharmacy called "Trying to get a refill for this patient for Meloxicam."  Asked if receipt of 07-11-2015 was actually received.  Pharmacist located order during this call and will prepare for patient.

## 2015-07-15 ENCOUNTER — Other Ambulatory Visit (HOSPITAL_BASED_OUTPATIENT_CLINIC_OR_DEPARTMENT_OTHER): Payer: Medicare PPO

## 2015-07-15 ENCOUNTER — Ambulatory Visit (HOSPITAL_BASED_OUTPATIENT_CLINIC_OR_DEPARTMENT_OTHER): Payer: Medicare PPO | Admitting: Nurse Practitioner

## 2015-07-15 ENCOUNTER — Encounter: Payer: Self-pay | Admitting: Nurse Practitioner

## 2015-07-15 VITALS — BP 110/56 | HR 89 | Temp 98.4°F | Resp 18 | Ht 61.5 in | Wt 226.5 lb

## 2015-07-15 DIAGNOSIS — M25512 Pain in left shoulder: Secondary | ICD-10-CM

## 2015-07-15 DIAGNOSIS — I10 Essential (primary) hypertension: Secondary | ICD-10-CM

## 2015-07-15 DIAGNOSIS — C50311 Malignant neoplasm of lower-inner quadrant of right female breast: Secondary | ICD-10-CM

## 2015-07-15 LAB — CBC WITH DIFFERENTIAL/PLATELET
BASO%: 0.8 % (ref 0.0–2.0)
Basophils Absolute: 0.1 10*3/uL (ref 0.0–0.1)
EOS%: 4.8 % (ref 0.0–7.0)
Eosinophils Absolute: 0.4 10*3/uL (ref 0.0–0.5)
HEMATOCRIT: 45.1 % (ref 34.8–46.6)
HGB: 14.3 g/dL (ref 11.6–15.9)
LYMPH#: 1.6 10*3/uL (ref 0.9–3.3)
LYMPH%: 22.5 % (ref 14.0–49.7)
MCH: 26.3 pg (ref 25.1–34.0)
MCHC: 31.7 g/dL (ref 31.5–36.0)
MCV: 83 fL (ref 79.5–101.0)
MONO#: 0.6 10*3/uL (ref 0.1–0.9)
MONO%: 7.8 % (ref 0.0–14.0)
NEUT%: 64.1 % (ref 38.4–76.8)
NEUTROS ABS: 4.7 10*3/uL (ref 1.5–6.5)
PLATELETS: 226 10*3/uL (ref 145–400)
RBC: 5.44 10*6/uL (ref 3.70–5.45)
RDW: 16.6 % — ABNORMAL HIGH (ref 11.2–14.5)
WBC: 7.3 10*3/uL (ref 3.9–10.3)

## 2015-07-15 LAB — COMPREHENSIVE METABOLIC PANEL (CC13)
ALT: 24 U/L (ref 0–55)
ANION GAP: 8 meq/L (ref 3–11)
AST: 26 U/L (ref 5–34)
Albumin: 3.5 g/dL (ref 3.5–5.0)
Alkaline Phosphatase: 62 U/L (ref 40–150)
BUN: 19.9 mg/dL (ref 7.0–26.0)
CALCIUM: 9.4 mg/dL (ref 8.4–10.4)
CHLORIDE: 107 meq/L (ref 98–109)
CO2: 27 meq/L (ref 22–29)
CREATININE: 1 mg/dL (ref 0.6–1.1)
EGFR: 72 mL/min/{1.73_m2} — ABNORMAL LOW (ref >90)
Glucose: 99 mg/dl (ref 70–140)
Potassium: 3.8 mEq/L (ref 3.5–5.1)
Sodium: 142 mEq/L (ref 136–145)
TOTAL PROTEIN: 7.1 g/dL (ref 6.4–8.3)
Total Bilirubin: 0.84 mg/dL (ref 0.20–1.20)

## 2015-07-15 NOTE — Progress Notes (Signed)
[place]  [phone] [phone]     ID: Jordan [name] DOB: 25-Jun-1951  MR#: [id number]  GUR#:[id number]  Patient Care Team: Harrison Mons, PA-C as PCP - General (Physician Assistant) [name], MD as Consulting Physician (General Surgery) Chauncey Cruel, MD as Consulting Physician (Oncology) Arloa Koh, MD as Consulting Physician (Radiation Oncology) Mauro Kaufmann, RN as Registered Nurse Rockwell Germany, RN as Registered Nurse Holley Bouche, NP as Nurse Practitioner (Nurse Practitioner) Sylvan Cheese, NP as Nurse Practitioner (Nurse Practitioner) PCP: [name] OTHER MD: [name] MD  CHIEF COMPLAINT: Early stage triple negative breast cancer  CURRENT TREATMENT: observation  BREAST CANCER HISTORY: From the original intake note:  "Jordan [name]" [facility] July [facility] 2015 showing breast density category B. A possible mass in the right breast was noted and on Jerry 20 03/05/2015 the patient underwent right diagnostic mammography with right ultrasonography. Spot compression views confirmed a slightly irregular mass in the lower inner quadrant of the right breast. This was not palpable by physical exam. Ultrasound showed a small hypoechoic mass measuring 5 mm in the area in question. Ultrasound of the right axilla was unremarkable.  Biopsy of the mass in question fibrin 05/06/2015 showed ([id number]) and invasive ductal carcinoma, grade 2, estrogen and progesterone receptor negative, with an MIB-1 of 41%, and no HER-2 amplification.  Bilateral breast MRI was obtained 10/04/2014. Results are pending.  The patient's subsequent history is as detailed below   INTERVAL HISTORY:  Jordan [name] returns today for follow up of her breast cancer. Since her last visit she has completed radiation and had her port removed by Dr. Marlou Starks. She visits him next month for follow up.  REVIEW OF  SYSTEMS: Jordan [name] has continued left arm/shoulder pain and left wrist welling. This was noted 3 months ago after a fall next to a bathtub. She is not currently taking anything for pain, but she rarely uses the arm to lift or push anything. Her range of motion is limited. She was recently taken off of metoprolol because it was bottoming her pressures out. Today, her pressure was 110/56. Occasionally she is dizzy and wonders if her pressure has dropped again. She denies headaches. She has shortness of breath with exertion, but denies chest pain, cough, or palpitations. She denies fevers, chills, nausea, vomiting, or changes in bowel or bladder habits. A detailed review of systems is otherwise stable.  PAST MEDICAL HISTORY: Past Medical History  Diagnosis Date  . Hyperlipidemia     takes Zetia and Zocor daily  . Thyroid disease   . Nasal congestion   . Leg swelling   . Constipation   . Nausea   . Generalized headaches     due to allergies, sinus  . PONV (postoperative nausea and vomiting)     pt states she is very easy to sedate  . Hypertension     takes Maxzide and Metoprolol daily  . Pneumonia     walking about 6-37yr ago  . History of bronchitis     last time about 6-761yrago  . Hx of seasonal allergies     takes OTC allergy med nightly  . History of migraine     last one about 15+yrs ago  . Dizziness     r/t side effects from meds  . Vertigo     takes Meclizine prn  . Joint pain   . Back pain  buldging disc  . GERD (gastroesophageal reflux disease)     takes Protonix as needed  . Hemorrhoids   . History of colon polyps   . Mass of colon   . History of UTI   . Hypothyroidism     takes Synthroid daily  . Depression     takes Wellbutrin daily  . Wears glasses   . S/P radiation therapy 03/11/2015 through 04/25/2015     Right breast 4500 cGy in 25 sessions, right breast boost 1600 cGy in 8 sessions   .  Tuberculosis     as little girl    . History of hiatal hernia   . Arthritis     oa  . Cancer ([place])     colon  . Breast cancer ([place]) 10/17/2014    lower inner quadrant of the right breast    PAST SURGICAL HISTORY: Past Surgical History  Procedure Laterality Date  . Cesarean section  1968, 1970  . Knee surgery  1998    right - arthroscopic  . Abdominal hysterectomy    . Exploratory laparotomy    . Carpal tunnel release    . Colonoscopy    . Esophagogastroduodenoscopy    . Partial colectomy  03/30/2012    Procedure: PARTIAL COLECTOMY;  Surgeon: [name], MD;  Location: [place];  Service: General;  Laterality: N/A;  . Appendectomy    . Radioactive seed guided mastectomy with axillary sentinel lymph node biopsy Right 10/17/2014    Procedure: RADIOACTIVE SEED GUIDED PARTIAL MASTECTOMY WITH AXILLARY SENTINEL LYMPH NODE BIOPSY;  Surgeon: [name], MD;  Location: [place] [facility];  Service: General;  Laterality: Right;  . Portacath placement    . Port-a-cath removal N/A 06/25/2015    Procedure: REMOVAL PORT-A-CATH;  Surgeon: [name], MD;  Location: [place];  Service: General;  Laterality: N/A;    FAMILY HISTORY Family History  Problem Relation Age of Onset  . Cancer Father 28    prostate  . Cancer Brother 48    colon  . Deep vein thrombosis Brother   . Cancer Maternal Aunt 4    breast  . Cancer Maternal Grandmother 8    breast  . Cancer Paternal Grandmother     colon  . Pulmonary embolism Brother     long-distance truck driver (PE x 2)  . Cancer Brother 73    colon  . Diabetes Maternal Grandfather   . Cancer Maternal Aunt 75    unknown type  . Mental illness Sister     history of Depression  . Seizures Sister 8    s/p traumatic brain injury  . Cancer Maternal Uncle     prostate   the patient's parents are still living, as of February 2016. Her father is 54 years old, with a history of prostate cancer diagnosed in his late 61s. The patient's mother is  49 years old. The patient had 5 brothers, 3 sisters. One brother died with prostate cancer at age 22. Another brother has a history of prostate cancer, age 84. One brother had colon cancer diagnosed age 22 as did a paternal grandmother. A maternal grandmother had breast cancer at age 63 as did a maternal aunt (diagnosed age 14.) A cousin was diagnosed with breast cancer the age of 80.  GYNECOLOGIC HISTORY:  No LMP recorded. Patient has had a hysterectomy. Menarche age 49, first live birth age 41. The patient is GX P1. She stopped having periods in 1981 when she  underwent hysterectomy and unilateral salpingo-oophorectomy.. She did not take hormone replacement.  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 Her husband [name] owns a business that [employer] at night. The patient's biological son [name] works in Biomedical scientist in [place]. He has 2 children. One of them, the patient's granddaughter [name], 2 years old, lives with the patient, and is a Interior and spatial designer at [employer]. The patient also has an adopted son, [name], lives in [place]. Jordan [name] attends a [employer] church in [place] where she grew up    ADVANCED DIRECTIVES: Not in place  HEALTH MAINTENANCE: Social History  Substance Use Topics  . Smoking status: Never Smoker   . Smokeless tobacco: Never Used  . Alcohol Use: Yes     Comment: RARELY     Colonoscopy: 03/30/2013/[facility]  PAP:  Bone density:  Lipid panel:  No Known Allergies  Current Outpatient Prescriptions  Medication Sig Dispense Refill  . aspirin 81 MG tablet Take 81 mg by mouth daily.    . diphenhydrAMINE (BENADRYL) 25 MG tablet Take 25 mg by mouth daily as needed for allergies.    Marland Kitchen ezetimibe (ZETIA) 10 MG tablet Take 1 tablet (10 mg total) by mouth daily. 30 tablet 5  . levothyroxine (SYNTHROID, LEVOTHROID) 125 MCG tablet TAKE 1 TABLET BY MOUTH DAILY 30 tablet 5  . meloxicam (MOBIC) 15 MG tablet Take 1 tablet (15 mg  total) by mouth daily. 30 tablet 3  . simvastatin (ZOCOR) 40 MG tablet Take 1 tablet (40 mg total) by mouth at bedtime. 30 tablet 5  . triamterene-hydrochlorothiazide (MAXZIDE-25) 37.5-25 MG tablet TAKE 1 TABLET DAILY 90 tablet 0  . nitroGLYCERIN (NITROSTAT) 0.4 MG SL tablet Place 1 tablet (0.4 mg total) under the tongue every 5 (five) minutes as needed for chest pain. (Patient not taking: Reported on 07/15/2015) 30 tablet 1  . oxyCODONE-acetaminophen (ROXICET) 5-325 MG tablet Take 1-2 tablets by mouth every 4 (four) hours as needed. (Patient not taking: Reported on 07/15/2015) 30 tablet 0  . Prenatal Multivit-Min-Fe-FA (PRENATAL VITAMINS PO) Take 1 tablet by mouth daily.      No current facility-administered medications for this visit.   Facility-Administered Medications Ordered in Other Visits  Medication Dose Route Frequency Provider Last Rate Last Dose  . chlorhexidine (HIBICLENS) 4 % liquid 1 application  1 application Topical Once [name], MD        OBJECTIVE: Middle-aged African-American woman walking with a cane Filed Vitals:   07/15/15 1142  BP: 110/56  Pulse: 89  Temp: 98.4 F (36.9 C)  Resp: 18     Body mass index is 42.11 kg/(m^2).    ECOG FS:1 - Symptomatic but completely ambulatory  Skin: warm, dry  HEENT: sclerae anicteric, conjunctivae pink, oropharynx clear. No thrush or mucositis.  Lymph Nodes: No cervical or supraclavicular lymphadenopathy  Lungs: clear to auscultation bilaterally, no rales, wheezes, or rhonci  Heart: regular rate and rhythm  Abdomen: round, soft, non tender, positive bowel sounds  Musculoskeletal: No focal spinal tenderness, limited range of motion to left arm, unable to lift above head or rotate, grade 1 left wrist/hand swelling Neuro: non focal, well oriented, positive affect  Breasts: right breast status post lumpectomy and radiation. No evidence of recurrent disease. Right axilla benign. Left breast unremarkable.  LAB RESULTS:  CMP      Component Value Date/Time   NA 142 07/15/2015 1126   NA 141 06/25/2015 0639   K 3.8 07/15/2015 1126   K  3.8 06/25/2015 0639   CL 107 06/25/2015 0639   CO2 27 07/15/2015 1126   CO2 24 06/25/2015 0639   GLUCOSE 99 07/15/2015 1126   GLUCOSE 105* 06/25/2015 0639   BUN 19.9 07/15/2015 1126   BUN 13 06/25/2015 0639   CREATININE 1.0 07/15/2015 1126   CREATININE 1.00 06/25/2015 0639   CREATININE 1.04 08/20/2014 1238   CALCIUM 9.4 07/15/2015 1126   CALCIUM 8.8* 06/25/2015 0639   PROT 7.1 07/15/2015 1126   PROT 6.9 08/20/2014 1238   ALBUMIN 3.5 07/15/2015 1126   ALBUMIN 3.9 08/20/2014 1238   AST 26 07/15/2015 1126   AST 23 08/20/2014 1238   ALT 24 07/15/2015 1126   ALT 24 08/20/2014 1238   ALKPHOS 62 07/15/2015 1126   ALKPHOS 54 08/20/2014 1238   BILITOT 0.84 07/15/2015 1126   BILITOT 0.6 08/20/2014 1238   GFRNONAA 57* 06/25/2015 0639   GFRAA >60 06/25/2015 0639    INo results found for: SPEP, UPEP  Lab Results  Component Value Date   WBC 7.3 07/15/2015   NEUTROABS 4.7 07/15/2015   HGB 14.3 07/15/2015   HCT 45.1 07/15/2015   MCV 83.0 07/15/2015   PLT 226 07/15/2015      Chemistry      Component Value Date/Time   NA 142 07/15/2015 1126   NA 141 06/25/2015 0639   K 3.8 07/15/2015 1126   K 3.8 06/25/2015 0639   CL 107 06/25/2015 0639   CO2 27 07/15/2015 1126   CO2 24 06/25/2015 0639   BUN 19.9 07/15/2015 1126   BUN 13 06/25/2015 0639   CREATININE 1.0 07/15/2015 1126   CREATININE 1.00 06/25/2015 0639   CREATININE 1.04 08/20/2014 1238      Component Value Date/Time   CALCIUM 9.4 07/15/2015 1126   CALCIUM 8.8* 06/25/2015 0639   ALKPHOS 62 07/15/2015 1126   ALKPHOS 54 08/20/2014 1238   AST 26 07/15/2015 1126   AST 23 08/20/2014 1238   ALT 24 07/15/2015 1126   ALT 24 08/20/2014 1238   BILITOT 0.84 07/15/2015 1126   BILITOT 0.6 08/20/2014 1238       No results found for: LABCA2  No components found for: LABCA125  No results for input(s): INR in the last  168 hours.  Urinalysis    Component Value Date/Time   COLORURINE YELLOW 03/09/2011 0857   APPEARANCEUR CLOUDY* 03/09/2011 0857   LABSPEC 1.022 03/09/2011 0857   PHURINE 5.5 03/09/2011 0857   GLUCOSEU NEGATIVE 03/09/2011 0857   HGBUR NEGATIVE 03/09/2011 0857   BILIRUBINUR SMALL* 03/09/2011 0857   KETONESUR NEGATIVE 03/09/2011 0857   PROTEINUR NEGATIVE 03/09/2011 0857   UROBILINOGEN 0.2 03/09/2011 0857   NITRITE NEGATIVE 03/09/2011 0857   LEUKOCYTESUR LARGE* 03/09/2011 0857    STUDIES: No results found.  ASSESSMENT: 64 y.o.  [place] woman status post right breast lower inner quadrant biopsy 09/24/2014 for a clinical T1a N0, stage IA invasive ductal carcinoma, grade 1 or 2, estrogen and progesterone receptor negative, with an MIB-1 of 41%, and HER-2 negative   (1) status post right lumpectomy and axillary sentinel lymph node sampling 10/17/2014 for a pT1b pN0, stage IA  invasive ductal carcinoma, grade 3, repeat HER-2 again negative.   (2) adjuvant chemotherapy started 12/09/2014, consisting of cyclophosphamide and docetaxel given every 3 weeks 4, completed 02/10/2015  (a) the docetaxel dose was decreased after the first cycle because of elevations in transaminases.   (3) adjuvant radiation started 03/18/2015  (4) status post partial right colectomy with lymphadenectomy 03/30/2012  for a 2.7 cm tubular adenoma with high-grade dysplasia, no evidence of invasion, 0 of 11 lymph nodes involved (SZA [phone])  (5) genetics testing sent 10/04/2014 through the [facility] gene panel /[facility] found no deleterious mutations in ATM, BARD1, BRCA1, BRCA2, BRIP1, CDH1, CHEK2, EPCAM, MLH1, MRE11A, MSH2, MSH6, MUTYH, NBN, NF1, PALB2, PMS2, PTEN, RAD50, RAD51C, RAD51D, SMARCA4, STK11, or TP53  (a) two variants of uncertain significance were found    (i) ATM, p.[id number]   (ii) SMARCA4, p.[id number]  (6) left IJ DVT 12/02/14, started on xarelto  (a) left upper extremity Doppler ultrasonography  03/28/2015 was negative: also shows left IJ clear  (7) elevated blood glucose. A1c on 12/03/14 was 6.4.  PLAN: Jordan [name] is doing well since she is no longer on active treatment. Her only glaring complaint is continued left shoulder/arm pain and wrist swelling from her fall a few months ago. She had a scan of her wrist and knows it not broken, but she can't lift her arm over her head at this point. I suggested she see an orthopedic specialist, and made the referral to [facility].  I advised her to restart taking her blood pressure twice daily and taking this information to her next visit with her PCP.   Jordan [name] will be due for a mammogram in February and will return to see Dr. Jana Hakim in early Spring, alternating visits with Dr. Marlou Starks. She understands and agrees with this plan. She has been encouraged to call with any issues that might arise before her next visit here.   Laurie Panda, NP   07/15/2015 1:10 PM

## 2015-07-16 ENCOUNTER — Telehealth: Payer: Self-pay | Admitting: Nurse Practitioner

## 2015-07-16 NOTE — Telephone Encounter (Signed)
Spoke with patient and she is aware of her follow up appointments and will call her pcp for a referral for her arm

## 2015-07-28 ENCOUNTER — Other Ambulatory Visit (HOSPITAL_COMMUNITY): Payer: Self-pay | Admitting: General Surgery

## 2015-07-28 DIAGNOSIS — C50311 Malignant neoplasm of lower-inner quadrant of right female breast: Secondary | ICD-10-CM

## 2015-07-30 ENCOUNTER — Other Ambulatory Visit: Payer: Self-pay | Admitting: General Surgery

## 2015-07-30 DIAGNOSIS — M542 Cervicalgia: Secondary | ICD-10-CM

## 2015-08-08 ENCOUNTER — Ambulatory Visit
Admission: RE | Admit: 2015-08-08 | Discharge: 2015-08-08 | Disposition: A | Discharge status: Home / Self Care | Payer: Medicare PPO | Source: Ambulatory Visit | Attending: General Surgery | Admitting: General Surgery

## 2015-08-08 DIAGNOSIS — M542 Cervicalgia: Secondary | ICD-10-CM

## 2015-08-08 DIAGNOSIS — M50222 Other cervical disc displacement at C5-C6 level: Secondary | ICD-10-CM | POA: Diagnosis not present

## 2015-08-08 DIAGNOSIS — M50221 Other cervical disc displacement at C4-C5 level: Secondary | ICD-10-CM | POA: Diagnosis not present

## 2015-08-08 MED ORDER — GADOBENATE DIMEGLUMINE 529 MG/ML IV SOLN
20.0000 mL | Freq: Once | INTRAVENOUS | Status: AC | PRN
  Administered 2015-08-08: 20 mL via INTRAVENOUS

## 2015-09-08 ENCOUNTER — Other Ambulatory Visit: Payer: Self-pay | Admitting: Physician Assistant

## 2015-09-08 ENCOUNTER — Ambulatory Visit
Admission: RE | Admit: 2015-09-08 | Discharge: 2015-09-08 | Disposition: A | Discharge status: Home / Self Care | Payer: BC Managed Care – PPO | Source: Ambulatory Visit | Attending: Nurse Practitioner | Admitting: Nurse Practitioner

## 2015-09-08 DIAGNOSIS — R928 Other abnormal and inconclusive findings on diagnostic imaging of breast: Secondary | ICD-10-CM | POA: Diagnosis not present

## 2015-09-08 DIAGNOSIS — C50311 Malignant neoplasm of lower-inner quadrant of right female breast: Secondary | ICD-10-CM

## 2015-09-16 ENCOUNTER — Ambulatory Visit (INDEPENDENT_AMBULATORY_CARE_PROVIDER_SITE_OTHER): Payer: Medicare Other | Admitting: Physician Assistant

## 2015-09-16 ENCOUNTER — Encounter: Payer: Self-pay | Admitting: Physician Assistant

## 2015-09-16 VITALS — BP 123/84 | HR 86 | Temp 98.2°F | Resp 16 | Ht 61.5 in | Wt 223.0 lb

## 2015-09-16 DIAGNOSIS — M19072 Primary osteoarthritis, left ankle and foot: Secondary | ICD-10-CM

## 2015-09-16 DIAGNOSIS — Z23 Encounter for immunization: Secondary | ICD-10-CM

## 2015-09-16 DIAGNOSIS — E785 Hyperlipidemia, unspecified: Secondary | ICD-10-CM | POA: Diagnosis not present

## 2015-09-16 DIAGNOSIS — M19071 Primary osteoarthritis, right ankle and foot: Secondary | ICD-10-CM | POA: Diagnosis not present

## 2015-09-16 DIAGNOSIS — M7989 Other specified soft tissue disorders: Secondary | ICD-10-CM | POA: Diagnosis not present

## 2015-09-16 DIAGNOSIS — I10 Essential (primary) hypertension: Secondary | ICD-10-CM | POA: Diagnosis not present

## 2015-09-16 DIAGNOSIS — E039 Hypothyroidism, unspecified: Secondary | ICD-10-CM | POA: Diagnosis not present

## 2015-09-16 DIAGNOSIS — M25512 Pain in left shoulder: Secondary | ICD-10-CM

## 2015-09-16 LAB — LIPID PANEL
CHOL/HDL RATIO: 2.1 ratio (ref <=5.0)
Cholesterol: 169 mg/dL (ref 125–200)
HDL: 79 mg/dL (ref >=46)
LDL CALC: 68 mg/dL (ref <130)
Triglycerides: 111 mg/dL (ref <150)
VLDL: 22 mg/dL (ref <30)

## 2015-09-16 LAB — TSH: TSH: 0.458 u[IU]/mL (ref 0.350–4.500)

## 2015-09-16 MED ORDER — MELOXICAM 15 MG PO TABS: 15.0000 mg | ORAL_TABLET | Freq: Every day | ORAL | Status: DC

## 2015-09-16 MED ORDER — TRIAMTERENE-HCTZ 37.5-25 MG PO TABS: ORAL_TABLET | ORAL | Status: DC

## 2015-09-16 MED ORDER — HYDROCODONE-ACETAMINOPHEN 5-325 MG PO TABS: 1.0000 | ORAL_TABLET | Freq: Four times a day (QID) | ORAL | Status: DC | PRN

## 2015-09-16 MED ORDER — SIMVASTATIN 40 MG PO TABS: 40.0000 mg | ORAL_TABLET | Freq: Every day | ORAL | Status: DC

## 2015-09-16 MED ORDER — LEVOTHYROXINE SODIUM 125 MCG PO TABS: 125.0000 ug | ORAL_TABLET | Freq: Every day | ORAL | Status: DC

## 2015-09-16 MED ORDER — EZETIMIBE 10 MG PO TABS: 10.0000 mg | ORAL_TABLET | Freq: Every day | ORAL | Status: DC

## 2015-09-16 NOTE — Progress Notes (Signed)
Patient ID: Jordan [name], female    DOB: Nov 29, 1950, 65 y.o.   MRN: [id number]  PCP: Janiece Scovill, PA-C  Subjective:   Chief Complaint  Patient presents with  . Follow-up    6 months  . Medication Refill    levothyroxine, triamterene-hctz, MOBIC, zetia and simvastatin    HPI Presents for evaluation of her chronic medical problems and for prescription refills.  Her chronic issues are well controlled, and she's tolerating medications without difficulty.  Continues to have swwelling in the LEFT hand after a fall late last summer. Pain extends up her arm into the shoulder and neck. She is LEFT hand dominant. The more she uses it, the more she swells. Xray of the hand on 03/25/2015 was normal. US venous doppler was negative for DVT. MR cspine 08/08/2015 revealed revealed mild degenerative changes. Wrist splint was not beneficial. Meloxicam isn't helpful. She's having trouble doing her ADLs and driving due to the pain and swelling.  Review of Systems As above. No CP, SOB, HA, dizziness.  No GI/GU symptoms.    Patient Active Problem List   Diagnosis Date Noted  . Constipation 12/16/2014  . Chemotherapy-induced nausea 12/16/2014  . Hyperglycemia 12/09/2014  . Internal jugular vein thrombosis ([place])   . Leukocytosis   . DVT (deep venous thrombosis) ([place]) 12/02/2014  . Osteoarthritis of both feet 11/26/2014  . Poor venous access   . Colorectal cancer ([place]) 10/03/2014  . Breast cancer of lower-inner quadrant of right female breast ([place]) 09/27/2014  . Severe obesity (BMI >= 40) ([place]) 11/06/2013  . Anemia, iron deficiency 04/25/2013  . GERD (gastroesophageal reflux disease) 02/12/2013  . HTN (hypertension) 10/21/2011  . Hypothyroidism 10/21/2011  . Depression 10/21/2011  . Hyperlipidemia 10/21/2011  . Lung nodules 10/21/2011  . Vertigo 10/21/2011  . Vitamin D deficiency 10/21/2011     Prior to Admission medications   Medication Sig Start Date End Date Taking? Authorizing  Provider  aspirin 81 MG tablet Take 81 mg by mouth daily.   Yes Historical Provider, MD  diphenhydrAMINE (BENADRYL) 25 MG tablet Take 25 mg by mouth daily as needed for allergies.   Yes Historical Provider, MD  ezetimibe (ZETIA) 10 MG tablet Take 1 tablet (10 mg total) by mouth daily. 03/04/15  Yes Lyan Moyano, PA-C  levothyroxine (SYNTHROID, LEVOTHROID) 125 MCG tablet TAKE 1 TABLET BY MOUTH DAILY 09/09/15  Yes Henley Boettner, PA-C  meloxicam (MOBIC) 15 MG tablet Take 1 tablet (15 mg total) by mouth daily. 07/11/15  Yes Chauncey Cruel, MD  nitroGLYCERIN (NITROSTAT) 0.4 MG SL tablet Place 1 tablet (0.4 mg total) under the tongue every 5 (five) minutes as needed for chest pain. 11/23/12  Yes Deetra Booton, PA-C  oxyCODONE-acetaminophen (ROXICET) 5-325 MG tablet Take 1-2 tablets by mouth every 4 (four) hours as needed. 06/25/15  Yes [name], MD  Prenatal Multivit-Min-Fe-FA (PRENATAL VITAMINS PO) Take 1 tablet by mouth daily.    Yes Historical Provider, MD  simvastatin (ZOCOR) 40 MG tablet Take 1 tablet (40 mg total) by mouth at bedtime. 03/04/15  Yes Kiyan Burmester, PA-C  triamterene-hydrochlorothiazide (MAXZIDE-25) 37.5-25 MG tablet TAKE 1 TABLET DAILY 07/14/15  Yes Mart Colpitts, PA-C  buPROPion (WELLBUTRIN XL) 150 MG 24 hr tablet TAKE 1 TABLET (150 MG TOTAL) BY MOUTH DAILY. 08/22/15   Historical Provider, MD     No Known Allergies     Objective:  Physical Exam  Constitutional: She is oriented to person, place, and time. Vital signs are normal. She appears  well-developed and well-nourished. She is active and cooperative. No distress.  BP 123/84 mmHg  Pulse 86  Temp(Src) 98.2 F (36.8 C) (Oral)  Resp 16  Ht 5' 1.5" (1.562 m)  Wt 223 lb (101.152 kg)  BMI 41.46 kg/m2  SpO2 94%  HENT:  Head: Normocephalic and atraumatic.  Right Ear: Hearing normal.  Left Ear: Hearing normal.  Eyes: Conjunctivae are normal. No scleral icterus.  Neck: Normal range of motion. Neck supple. No  thyromegaly present.  Cardiovascular: Normal rate, regular rhythm and normal heart sounds.   Pulses:      Radial pulses are 2+ on the right side, and 2+ on the left side.  Pulmonary/Chest: Effort normal and breath sounds normal.  Musculoskeletal:       Right shoulder: Normal.       Left shoulder: Normal.       Left elbow: Normal.       Left wrist: She exhibits swelling. She exhibits normal range of motion, no tenderness, no bony tenderness, no effusion, no crepitus, no deformity and no laceration.       Left upper arm: Normal.       Left forearm: Normal.       Left hand: She exhibits disruption of two-point discrimination and swelling. She exhibits normal range of motion, no tenderness, no bony tenderness, normal capillary refill, no deformity and no laceration. Normal sensation noted. Normal strength noted.  Lymphadenopathy:       Head (right side): No tonsillar, no preauricular, no posterior auricular and no occipital adenopathy present.       Head (left side): No tonsillar, no preauricular, no posterior auricular and no occipital adenopathy present.    She has no cervical adenopathy.       Right: No supraclavicular adenopathy present.       Left: No supraclavicular adenopathy present.  Neurological: She is alert and oriented to person, place, and time. No sensory deficit.  Skin: Skin is warm, dry and intact. No rash noted. No cyanosis or erythema. Nails show no clubbing.  Psychiatric: She has a normal mood and affect. Her speech is normal and behavior is normal.           Assessment & Plan:   1. Hyperlipidemia Await labs. Continue current treatment. - Lipid panel - simvastatin (ZOCOR) 40 MG tablet; Take 1 tablet (40 mg total) by mouth at bedtime.  Dispense: 30 tablet; Refill: 5 - ezetimibe (ZETIA) 10 MG tablet; Take 1 tablet (10 mg total) by mouth daily.  Dispense: 30 tablet; Refill: 5  2. Hypothyroidism, unspecified hypothyroidism type Await labs. Continue current  treatment. - TSH - levothyroxine (SYNTHROID, LEVOTHROID) 125 MCG tablet; Take 1 tablet (125 mcg total) by mouth daily.  Dispense: 30 tablet; Refill: 5  3. Essential hypertension Controlled. Continue current treatment. - triamterene-hydrochlorothiazide (MAXZIDE-25) 37.5-25 MG tablet; TAKE 1 TABLET DAILY  Dispense: 90 tablet; Refill: 0  4. Osteoarthritis of both feet Stable. She's not interested in the oxycodone, but needs something to use when the meloxicam isn't helping. - meloxicam (MOBIC) 15 MG tablet; Take 1 tablet (15 mg total) by mouth daily.  Dispense: 30 tablet; Refill: 5 - HYDROcodone-acetaminophen (NORCO) 5-325 MG tablet; Take 1 tablet by mouth every 6 (six) hours as needed.  Dispense: 30 tablet; Refill: 0  5. Need for pneumococcal vaccination - Pneumococcal conjugate vaccine 13-valent IM  6. Swelling of left hand 7. Pain in joint of left shoulder Unclear etiology here. Possibly shoulder, but she didn't injure it in her  fall, to her knowledge. Refer to orthopedics for additional evaluation and treatment. - Ambulatory referral to Orthopedic Surgery - HYDROcodone-acetaminophen (NORCO) 5-325 MG tablet; Take 1 tablet by mouth every 6 (six) hours as needed.  Dispense: 30 tablet; Refill: 0   Return in about 3 months (around 12/14/2015).    Fara Chute, PA-C Physician Assistant-Certified [facility]

## 2015-09-17 ENCOUNTER — Encounter: Payer: Self-pay | Admitting: Physician Assistant

## 2015-10-06 ENCOUNTER — Other Ambulatory Visit: Payer: Self-pay | Admitting: Physician Assistant

## 2015-10-14 DIAGNOSIS — M7502 Adhesive capsulitis of left shoulder: Secondary | ICD-10-CM | POA: Diagnosis not present

## 2015-10-16 ENCOUNTER — Encounter: Payer: Self-pay | Admitting: Adult Health

## 2015-10-16 NOTE — Progress Notes (Signed)
A birthday card was mailed to the patient today on behalf of the Survivorship Program at [facility].   Gretchen Dawson, NP Survivorship Program [facility] [phone]  

## 2015-10-22 DIAGNOSIS — M7502 Adhesive capsulitis of left shoulder: Secondary | ICD-10-CM | POA: Diagnosis not present

## 2015-10-28 DIAGNOSIS — M7502 Adhesive capsulitis of left shoulder: Secondary | ICD-10-CM | POA: Diagnosis not present

## 2015-10-31 DIAGNOSIS — M25512 Pain in left shoulder: Secondary | ICD-10-CM | POA: Diagnosis not present

## 2015-10-31 DIAGNOSIS — M7502 Adhesive capsulitis of left shoulder: Secondary | ICD-10-CM | POA: Diagnosis not present

## 2015-11-09 ENCOUNTER — Other Ambulatory Visit: Payer: Self-pay | Admitting: Physician Assistant

## 2015-11-10 ENCOUNTER — Other Ambulatory Visit (HOSPITAL_BASED_OUTPATIENT_CLINIC_OR_DEPARTMENT_OTHER): Payer: Medicare Other

## 2015-11-10 ENCOUNTER — Ambulatory Visit (HOSPITAL_BASED_OUTPATIENT_CLINIC_OR_DEPARTMENT_OTHER): Payer: Medicare Other

## 2015-11-10 ENCOUNTER — Ambulatory Visit (HOSPITAL_BASED_OUTPATIENT_CLINIC_OR_DEPARTMENT_OTHER): Payer: Medicare Other | Admitting: Oncology

## 2015-11-10 ENCOUNTER — Telehealth: Payer: Self-pay | Admitting: Oncology

## 2015-11-10 VITALS — BP 120/55 | HR 88 | Temp 98.1°F | Resp 18 | Ht 61.5 in | Wt 221.7 lb

## 2015-11-10 DIAGNOSIS — C50311 Malignant neoplasm of lower-inner quadrant of right female breast: Secondary | ICD-10-CM

## 2015-11-10 DIAGNOSIS — Z853 Personal history of malignant neoplasm of breast: Secondary | ICD-10-CM | POA: Diagnosis not present

## 2015-11-10 DIAGNOSIS — Z86718 Personal history of other venous thrombosis and embolism: Secondary | ICD-10-CM | POA: Diagnosis not present

## 2015-11-10 LAB — URINALYSIS, MICROSCOPIC - CHCC
Bilirubin (Urine): NEGATIVE
Blood: NEGATIVE
Glucose: NEGATIVE mg/dL
Ketones: NEGATIVE mg/dL
Nitrite: NEGATIVE
PROTEIN: NEGATIVE mg/dL
Specific Gravity, Urine: 1.02 (ref 1.003–1.035)
UROBILINOGEN UR: 0.2 mg/dL (ref 0.2–1)
pH: 6 (ref 4.6–8.0)

## 2015-11-10 LAB — COMPREHENSIVE METABOLIC PANEL
ALBUMIN: 3.2 g/dL — AB (ref 3.5–5.0)
ALK PHOS: 68 U/L (ref 40–150)
ALT: 25 U/L (ref 0–55)
AST: 22 U/L (ref 5–34)
Anion Gap: 8 mEq/L (ref 3–11)
BUN: 12.1 mg/dL (ref 7.0–26.0)
CALCIUM: 9.2 mg/dL (ref 8.4–10.4)
CHLORIDE: 106 meq/L (ref 98–109)
CO2: 27 mEq/L (ref 22–29)
CREATININE: 1 mg/dL (ref 0.6–1.1)
EGFR: 65 mL/min/{1.73_m2} — ABNORMAL LOW (ref >90)
GLUCOSE: 113 mg/dL (ref 70–140)
POTASSIUM: 3.5 meq/L (ref 3.5–5.1)
SODIUM: 141 meq/L (ref 136–145)
Total Bilirubin: 0.85 mg/dL (ref 0.20–1.20)
Total Protein: 7 g/dL (ref 6.4–8.3)

## 2015-11-10 LAB — CBC WITH DIFFERENTIAL/PLATELET
BASO%: 0.5 % (ref 0.0–2.0)
Basophils Absolute: 0.1 10*3/uL (ref 0.0–0.1)
EOS ABS: 0.2 10*3/uL (ref 0.0–0.5)
EOS%: 2.1 % (ref 0.0–7.0)
HCT: 47.5 % — ABNORMAL HIGH (ref 34.8–46.6)
HEMOGLOBIN: 15.2 g/dL (ref 11.6–15.9)
LYMPH%: 22.4 % (ref 14.0–49.7)
MCH: 27.3 pg (ref 25.1–34.0)
MCHC: 32 g/dL (ref 31.5–36.0)
MCV: 85.5 fL (ref 79.5–101.0)
MONO#: 0.6 10*3/uL (ref 0.1–0.9)
MONO%: 6 % (ref 0.0–14.0)
NEUT%: 69 % (ref 38.4–76.8)
NEUTROS ABS: 7.1 10*3/uL — AB (ref 1.5–6.5)
RBC: 5.56 10*6/uL — ABNORMAL HIGH (ref 3.70–5.45)
RDW: 15.9 % — AB (ref 11.2–14.5)
WBC: 10.3 10*3/uL (ref 3.9–10.3)
lymph#: 2.3 10*3/uL (ref 0.9–3.3)

## 2015-11-10 MED ORDER — LORAZEPAM 0.5 MG PO TABS: 0.5000 mg | ORAL_TABLET | Freq: Three times a day (TID) | ORAL | Status: DC

## 2015-11-10 NOTE — Telephone Encounter (Signed)
Patient sent back to lab and given avs report and appointments for September 2017.

## 2015-11-10 NOTE — Telephone Encounter (Signed)
[facility] will contact patient re appointment.

## 2015-11-10 NOTE — Progress Notes (Signed)
[place]  [phone] [phone]     ID: Jordan [name] DOB: Dec 26, 1950  MR#: [id number]  EUM#:[id number]  Patient Care Team: Harrison Mons, PA-C as PCP - General (Physician Assistant) [name], MD as Consulting Physician (General Surgery) Chauncey Cruel, MD as Consulting Physician (Oncology) Arloa Koh, MD as Consulting Physician (Radiation Oncology) Mauro Kaufmann, RN as Registered Nurse Rockwell Germany, RN as Registered Nurse Holley Bouche, NP as Nurse Practitioner (Nurse Practitioner) Sylvan Cheese, NP as Nurse Practitioner (Nurse Practitioner) PCP: [name] OTHER MD: [name] MD  CHIEF COMPLAINT: Early stage triple negative breast cancer  CURRENT TREATMENT: observation  BREAST CANCER HISTORY: From the original intake note:  "Jordan [name]" [facility] 2015 showing breast density category B. A possible mass in the right breast was noted and on Jerry 20 03/05/2015 the patient underwent right diagnostic mammography with right ultrasonography. Spot compression views confirmed a slightly irregular mass in the lower inner quadrant of the right breast. This was not palpable by physical exam. Ultrasound showed a small hypoechoic mass measuring 5 mm in the area in question. Ultrasound of the right axilla was unremarkable.  Biopsy of the mass in question fibrin 05/06/2015 showed ([id number]) and invasive ductal carcinoma, grade 2, estrogen and progesterone receptor negative, with an MIB-1 of 41%, and no HER-2 amplification.  Bilateral breast MRI was obtained 10/04/2014. Results are pending.  The patient's subsequent history is as detailed below   INTERVAL HISTORY:  Jordan [name] returns today for follow up of her triple negativebreast cancer.  Since her last visit here she had some trauma to her left shoulder and was referred to orthopedics, who told her that she had a  rotator cuff injury. However she does not recall the physician's name and I do not find a study in appetite.   She did have a bilateral mammography with tomography late January of this year, with no evidence of disease activity.  REVIEW OF SYSTEMS: Loletta Specter to have significant left shoulder pain. She says that her left hand sometimes gets swollen, but only when she uses it a lot. She never did make it to physical therapy. She denies unusual headaches, visual changes, cough, phlegm production, or sinus symptoms. She denies any dysuria or hematuria.  She denies rash, bleeding, unexplained fatigue or unexplained weight loss. A detailed review of systems today was otherwise stable  PAST MEDICAL HISTORY: Past Medical History  Diagnosis Date  . Hyperlipidemia     takes Zetia and Zocor daily  . Thyroid disease   . Nasal congestion   . Leg swelling   . Constipation   . Nausea   . Generalized headaches     due to allergies, sinus  . PONV (postoperative nausea and vomiting)     pt states she is very easy to sedate  . Hypertension     takes Maxzide and Metoprolol daily  . Pneumonia     walking about 6-47yr ago  . History of bronchitis     last time about 6-774yrago  . Hx of seasonal allergies     takes OTC allergy med nightly  . History of migraine     last one about 15+yrs ago  . Dizziness     r/t side effects from meds  . Vertigo     takes Meclizine prn  . Joint pain   . Back pain     buldging  disc  . GERD (gastroesophageal reflux disease)     takes Protonix as needed  . Hemorrhoids   . History of colon polyps   . Mass of colon   . History of UTI   . Hypothyroidism     takes Synthroid daily  . Depression     takes Wellbutrin daily  . Wears glasses   . S/P radiation therapy 03/11/2015 through 04/25/2015     Right breast 4500 cGy in 25 sessions, right breast boost 1600 cGy in 8 sessions   .  Tuberculosis     as little girl    . History of hiatal hernia   . Arthritis     oa  . Cancer ([place])     colon  . Breast cancer ([place]) 10/17/2014    lower inner quadrant of the right breast    PAST SURGICAL HISTORY: Past Surgical History  Procedure Laterality Date  . Cesarean section  1968, 1970  . Knee surgery  1998    right - arthroscopic  . Abdominal hysterectomy    . Exploratory laparotomy    . Carpal tunnel release    . Colonoscopy    . Esophagogastroduodenoscopy    . Partial colectomy  03/30/2012    Procedure: PARTIAL COLECTOMY;  Surgeon: [name], MD;  Location: [facility];  Service: General;  Laterality: N/A;  . Appendectomy    . Radioactive seed guided mastectomy with axillary sentinel lymph node biopsy Right 10/17/2014    Procedure: RADIOACTIVE SEED GUIDED PARTIAL MASTECTOMY WITH AXILLARY SENTINEL LYMPH NODE BIOPSY;  Surgeon: [name], MD;  Location: [facility];  Service: General;  Laterality: Right;  . Portacath placement    . Port-a-cath removal N/A 06/25/2015    Procedure: REMOVAL PORT-A-CATH;  Surgeon: [name], MD;  Location: [facility];  Service: General;  Laterality: N/A;    FAMILY HISTORY Family History  Problem Relation Age of Onset  . Cancer Father 50    prostate  . Cancer Brother 46    colon  . Deep vein thrombosis Brother   . Cancer Maternal Aunt 15    breast  . Cancer Maternal Grandmother 34    breast  . Cancer Paternal Grandmother     colon  . Pulmonary embolism Brother     long-distance truck driver (PE x 2)  . Cancer Brother 61    colon  . Diabetes Maternal Grandfather   . Cancer Maternal Aunt 75    unknown type  . Mental illness Sister     history of Depression  . Seizures Sister 8    s/p traumatic brain injury  . Cancer Maternal Uncle     prostate   the patient's parents are still living, as of February 2016. Her father is 63 years old, with a history of prostate cancer diagnosed in his late 31s. The patient's mother is  2 years old. The patient had 5 brothers, 3 sisters. One brother died with prostate cancer at age 13. Another brother has a history of prostate cancer, age 64. One brother had colon cancer diagnosed age 42 as did a paternal grandmother. A maternal grandmother had breast cancer at age 26 as did a maternal aunt (diagnosed age 33.) A cousin was diagnosed with breast cancer the age of 66.  GYNECOLOGIC HISTORY:  No LMP recorded. Patient has had a hysterectomy. Menarche age 49, first live birth age 30. The patient is GX P1. She stopped having periods in 1981 when she underwent  hysterectomy and unilateral salpingo-oophorectomy.. She did not take hormone replacement.  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 Her husband [name] owns a business that [employer] at night. The patient's biological son [name] works in Biomedical scientist in [place]. He has 2 children. One of them, the patient's granddaughter [name], 81 years old, lives with the patient, and is a Interior and spatial designer at [employer]. The patient also has an adopted son, [name], lives in [place]. Jordan [name] attends a [employer] church in [place] where she grew up    ADVANCED DIRECTIVES: Not in place  HEALTH MAINTENANCE: Social History  Substance Use Topics  . Smoking status: Never Smoker   . Smokeless tobacco: Never Used  . Alcohol Use: Yes     Comment: RARELY     Colonoscopy: 03/30/2013/[facility]  PAP:  Bone density:  Lipid panel:  No Known Allergies  Current Outpatient Prescriptions  Medication Sig Dispense Refill  . aspirin 81 MG tablet Take 81 mg by mouth daily.    Marland Kitchen buPROPion (WELLBUTRIN XL) 150 MG 24 hr tablet TAKE 1 TABLET (150 MG TOTAL) BY MOUTH DAILY.  5  . diphenhydrAMINE (BENADRYL) 25 MG tablet Take 25 mg by mouth daily as needed for allergies.    Marland Kitchen ezetimibe (ZETIA) 10 MG tablet Take 1 tablet (10 mg total) by mouth daily. 30 tablet 5  . HYDROcodone-acetaminophen (NORCO) 5-325 MG tablet Take  1 tablet by mouth every 6 (six) hours as needed. 30 tablet 0  . levothyroxine (SYNTHROID, LEVOTHROID) 125 MCG tablet Take 1 tablet (125 mcg total) by mouth daily. 30 tablet 5  . meloxicam (MOBIC) 15 MG tablet Take 1 tablet (15 mg total) by mouth daily. 30 tablet 5  . nitroGLYCERIN (NITROSTAT) 0.4 MG SL tablet Place 1 tablet (0.4 mg total) under the tongue every 5 (five) minutes as needed for chest pain. 30 tablet 1  . Prenatal Multivit-Min-Fe-FA (PRENATAL VITAMINS PO) Take 1 tablet by mouth daily.     . simvastatin (ZOCOR) 40 MG tablet Take 1 tablet (40 mg total) by mouth at bedtime. 30 tablet 5  . triamterene-hydrochlorothiazide (MAXZIDE-25) 37.5-25 MG tablet TAKE 1 TABLET DAILY 90 tablet 0   No current facility-administered medications for this visit.   Facility-Administered Medications Ordered in Other Visits  Medication Dose Route Frequency Provider Last Rate Last Dose  . chlorhexidine (HIBICLENS) 4 % liquid 1 application  1 application Topical Once [name], MD        OBJECTIVE: Middle-aged African-American woman  Who appears stated age; she uses a cane Filed Vitals:   11/10/15 1119  BP: 120/55  Pulse: 88  Temp: 98.1 F (36.7 C)  Resp: 18     Body mass index is 41.22 kg/(m^2).    ECOG FS:2 - Symptomatic, <50% confined to bed  Sclerae unicteric, pupils round and equal Oropharynx clear and moist-- no thrush or other lesions No cervical or supraclavicular adenopathy Lungs no rales or rhonchi Heart regular rate and rhythm Abd soft,  Obese,nontender, positive bowel sounds MSK no focal spinal tenderness, no upper extremity lymphedema and specifically no left upper extremity/hand swelling Neuro: nonfocal, and specifically abduction of both upper extremities is 5 minus over 5 well oriented, appropriate affect Breasts:  The right breast is status post lumpectomy and radiation. There is some skin thickening as expected. There is no evidence of local recurrence. The right axilla is  benign. The left breast is unremarkable.   LAB RESULTS:  CMP     Component  Value Date/Time   NA 141 11/10/2015 1040   NA 141 06/25/2015 0639   K 3.5 11/10/2015 1040   K 3.8 06/25/2015 0639   CL 107 06/25/2015 0639   CO2 27 11/10/2015 1040   CO2 24 06/25/2015 0639   GLUCOSE 113 11/10/2015 1040   GLUCOSE 105* 06/25/2015 0639   BUN 12.1 11/10/2015 1040   BUN 13 06/25/2015 0639   CREATININE 1.0 11/10/2015 1040   CREATININE 1.00 06/25/2015 0639   CREATININE 1.04 08/20/2014 1238   CALCIUM 9.2 11/10/2015 1040   CALCIUM 8.8* 06/25/2015 0639   PROT 7.0 11/10/2015 1040   PROT 6.9 08/20/2014 1238   ALBUMIN 3.2* 11/10/2015 1040   ALBUMIN 3.9 08/20/2014 1238   AST 22 11/10/2015 1040   AST 23 08/20/2014 1238   ALT 25 11/10/2015 1040   ALT 24 08/20/2014 1238   ALKPHOS 68 11/10/2015 1040   ALKPHOS 54 08/20/2014 1238   BILITOT 0.85 11/10/2015 1040   BILITOT 0.6 08/20/2014 1238   GFRNONAA 57* 06/25/2015 0639   GFRAA >60 06/25/2015 0639    INo results found for: SPEP, UPEP  Lab Results  Component Value Date   WBC 10.3 11/10/2015   NEUTROABS 7.1* 11/10/2015   HGB 15.2 11/10/2015   HCT 47.5* 11/10/2015   MCV 85.5 11/10/2015   PLT 228 Platelet count confirmed by slide estimate 11/10/2015      Chemistry      Component Value Date/Time   NA 141 11/10/2015 1040   NA 141 06/25/2015 0639   K 3.5 11/10/2015 1040   K 3.8 06/25/2015 0639   CL 107 06/25/2015 0639   CO2 27 11/10/2015 1040   CO2 24 06/25/2015 0639   BUN 12.1 11/10/2015 1040   BUN 13 06/25/2015 0639   CREATININE 1.0 11/10/2015 1040   CREATININE 1.00 06/25/2015 0639   CREATININE 1.04 08/20/2014 1238      Component Value Date/Time   CALCIUM 9.2 11/10/2015 1040   CALCIUM 8.8* 06/25/2015 0639   ALKPHOS 68 11/10/2015 1040   ALKPHOS 54 08/20/2014 1238   AST 22 11/10/2015 1040   AST 23 08/20/2014 1238   ALT 25 11/10/2015 1040   ALT 24 08/20/2014 1238   BILITOT 0.85 11/10/2015 1040   BILITOT 0.6 08/20/2014 1238         No results found for: LABCA2  No components found for: LABCA125  No results for input(s): INR in the last 168 hours.  Urinalysis    Component Value Date/Time   COLORURINE YELLOW 03/09/2011 0857   APPEARANCEUR CLOUDY* 03/09/2011 0857   LABSPEC 1.022 03/09/2011 0857   PHURINE 5.5 03/09/2011 0857   GLUCOSEU NEGATIVE 03/09/2011 0857   HGBUR NEGATIVE 03/09/2011 0857   BILIRUBINUR SMALL* 03/09/2011 0857   KETONESUR NEGATIVE 03/09/2011 0857   PROTEINUR NEGATIVE 03/09/2011 0857   UROBILINOGEN 0.2 03/09/2011 0857   NITRITE NEGATIVE 03/09/2011 0857   LEUKOCYTESUR LARGE* 03/09/2011 0857    STUDIES: CLINICAL DATA: First postoperative mammogram, for annual exam. The patient had lumpectomy and radiation therapy for the right breast. Lumpectomy was performed in March 2016. She is asymptomatic.  EXAM: DIGITAL DIAGNOSTIC BILATERAL MAMMOGRAM WITH 3D TOMOSYNTHESIS AND CAD  COMPARISON: Previous exam(s).  ACR Breast Density Category b: There are scattered areas of fibroglandular density.  FINDINGS: There are lumpectomy changes in the medial right breast. Surgical clips are present in the right axilla. There is skin thickening and increased trabecular markings of the right breast parenchyma compatible with interval radiation therapy. No mass, nonsurgical distortion, or  suspicious microcalcification is identified in either breast to suggest malignancy.  Mammographic images were processed with CAD.  IMPRESSION: Post treatment changes of the right breast. No evidence of malignancy bilaterally.  RECOMMENDATION: Diagnostic mammogram is suggested in 1 year. (Code:[id number]-B-01Y)  I have discussed the findings and recommendations with the patient. Results were also provided in writing at the conclusion of the visit. If applicable, a reminder letter will be sent to the patient regarding the next appointment.  BI-RADS CATEGORY 2: Benign.   Electronically Signed  By:  Curlene Dolphin M.D.  On: 09/08/2015 13:50    ASSESSMENT: 65 y.o.  [place] woman status post right breast lower inner quadrant biopsy 09/24/2014 for a clinical T1a N0, stage IA invasive ductal carcinoma, grade 1 or 2, estrogen and progesterone receptor negative, with an MIB-1 of 41%, and HER-2 negative   (1) status post right lumpectomy and axillary sentinel lymph node sampling 10/17/2014 for a pT1b pN0, stage IA  invasive ductal carcinoma, grade 3, repeat HER-2 again negative.   (2) adjuvant chemotherapy started 12/09/2014, consisting of cyclophosphamide and docetaxel given every 3 weeks 4, completed 02/10/2015  (a) the docetaxel dose was decreased after the first cycle because of elevations in transaminases.   (3) adjuvant radiation started 03/18/2015  (4) status post partial right colectomy with lymphadenectomy 03/30/2012 for a 2.7 cm tubular adenoma with high-grade dysplasia, no evidence of invasion, 0 of 11 lymph nodes involved (SZA [phone])  (5) genetics testing sent 10/04/2014 through the [facility] [place] gene panel /[facility] found no deleterious mutations in ATM, BARD1, BRCA1, BRCA2, BRIP1, CDH1, CHEK2, EPCAM, MLH1, MRE11A, MSH2, MSH6, MUTYH, NBN, NF1, PALB2, PMS2, PTEN, RAD50, RAD51C, RAD51D, SMARCA4, STK11, or TP53  (a) two variants of uncertain significance were found    (i) ATM, p.[id number]   (ii) SMARCA4, p.[id number]  (6) left IJ DVT 12/02/14, started on xarelto  (a) left upper extremity Doppler ultrasonography 03/28/2015 was negative: also shows left IJ clear  (b) xarelto discontinued September 2016  (7) elevated blood glucose. A1c on 12/03/14 was 6.4.  PLAN: Jordan [name] is now a year out from her definitive surgery with no evidence of disease recurrence. This is very favorable.  She tells me she never went to physical therapy. We will put a physical therapy referral for her. This should help her learn how to massage her arm and also she may want to obtain a compression sleeve.  She  is to follow-up with her orthopedist regarding the left shoulder and left arm problem but she does not recall his name. She needs to look in her records and make a new appointment.  She requested a refill on her lorazepam. I was glad to provide that. She understands this drug can induce tolerance and she should not take it more than twice a week.  Otherwise she will return to see me again in 6 months. She knows to call for any problems that may develop before that visit.   Chauncey Cruel, MD   11/10/2015 11:32 AM

## 2015-11-11 ENCOUNTER — Other Ambulatory Visit: Payer: Self-pay | Admitting: Oncology

## 2015-11-11 NOTE — Telephone Encounter (Signed)
Chelle, you just saw pt for med refills, but don't see this one discussed. OK to RF?

## 2015-11-13 DIAGNOSIS — M7502 Adhesive capsulitis of left shoulder: Secondary | ICD-10-CM | POA: Diagnosis not present

## 2015-11-18 ENCOUNTER — Ambulatory Visit: Payer: BC Managed Care – PPO | Attending: Oncology | Admitting: Physical Therapy

## 2015-11-18 DIAGNOSIS — M79602 Pain in left arm: Secondary | ICD-10-CM

## 2015-11-18 DIAGNOSIS — M25612 Stiffness of left shoulder, not elsewhere classified: Secondary | ICD-10-CM

## 2015-11-18 DIAGNOSIS — M25512 Pain in left shoulder: Secondary | ICD-10-CM | POA: Insufficient documentation

## 2015-11-18 DIAGNOSIS — R2232 Localized swelling, mass and lump, left upper limb: Secondary | ICD-10-CM
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
## 2015-11-24 ENCOUNTER — Ambulatory Visit: Payer: BC Managed Care – PPO | Admitting: Physical Therapy

## 2015-11-25 ENCOUNTER — Ambulatory Visit: Payer: BC Managed Care – PPO

## 2015-11-25 DIAGNOSIS — M25512 Pain in left shoulder: Secondary | ICD-10-CM

## 2015-11-25 DIAGNOSIS — M25612 Stiffness of left shoulder, not elsewhere classified: Secondary | ICD-10-CM

## 2015-11-25 DIAGNOSIS — M79602 Pain in left arm: Secondary | ICD-10-CM | POA: Diagnosis not present
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
## 2015-11-27 ENCOUNTER — Ambulatory Visit: Payer: BC Managed Care – PPO

## 2015-11-27 DIAGNOSIS — M25612 Stiffness of left shoulder, not elsewhere classified: Secondary | ICD-10-CM

## 2015-11-27 DIAGNOSIS — M79602 Pain in left arm: Secondary | ICD-10-CM | POA: Diagnosis not present

## 2015-11-27 DIAGNOSIS — M25512 Pain in left shoulder: Secondary | ICD-10-CM

## 2015-11-27 NOTE — Therapy (Signed)
[place], [place], [address] Phone: [phone]   Fax:  [phone]  Physical Therapy Treatment  Patient Details  Name: Jordan [name] MRN: [id number] Date of Birth: 08-May-1951 Referring Provider: Dr. Lurline Del  Encounter Date: 11/27/2015      PT End of Session - 11/27/15 0935    Visit Number 3   Number of Visits 17   Date for PT Re-Evaluation 01/17/16   PT Start Time 0851   PT Stop Time 0934   PT Time Calculation (min) 43 min   Activity Tolerance Patient tolerated treatment well   Behavior During Therapy [facility] for tasks assessed/performed      Past Medical History  Diagnosis Date  . Hyperlipidemia     takes Zetia and Zocor daily  . Thyroid disease   . Nasal congestion   . Leg swelling   . Constipation   . Nausea   . Generalized headaches     due to allergies, sinus  . PONV (postoperative nausea and vomiting)     pt states she is very easy to sedate  . Hypertension     takes Maxzide and Metoprolol daily  . Pneumonia     walking about 6-31yrs ago  . History of bronchitis     last time about 6-3yrs ago  . Hx of seasonal allergies     takes OTC allergy med nightly  . History of migraine     last one about 15+yrs ago  . Dizziness     r/t side effects from meds  . Vertigo     takes Meclizine prn  . Joint pain   . Back pain     buldging disc  . GERD (gastroesophageal reflux disease)     takes Protonix as needed  . Hemorrhoids   . History of colon polyps   . Mass of colon   . History of UTI   . Hypothyroidism     takes Synthroid daily  . Depression     takes Wellbutrin daily  . Wears glasses   . S/P radiation therapy 03/11/2015 through 04/25/2015     Right breast 4500 cGy in 25 sessions, right breast boost 1600 cGy in 8 sessions   . Tuberculosis     as little girl    . History of hiatal hernia   .  Arthritis     oa  . Cancer ([place])     colon  . Breast cancer ([place]) 10/17/2014    lower inner quadrant of the right breast    Past Surgical History  Procedure Laterality Date  . Cesarean section  1968, 1970  . Knee surgery  1998    right - arthroscopic  . Abdominal hysterectomy    . Exploratory laparotomy    . Carpal tunnel release    . Colonoscopy    . Esophagogastroduodenoscopy    . Partial colectomy  03/30/2012    Procedure: PARTIAL COLECTOMY;  Surgeon: [name], MD;  Location: [place];  Service: General;  Laterality: N/A;  . Appendectomy    . Radioactive seed guided mastectomy with axillary sentinel lymph node biopsy Right 10/17/2014    Procedure: RADIOACTIVE SEED GUIDED PARTIAL MASTECTOMY WITH AXILLARY SENTINEL LYMPH NODE BIOPSY;  Surgeon: [name], MD;  Location: [place];  Service: General;  Laterality: Right;  . Portacath placement    . Port-a-cath removal N/A 06/25/2015    Procedure: REMOVAL PORT-A-CATH;  Surgeon: [name], MD;  [facility];  Service: General;  Laterality: N/A;    There were no vitals filed for this visit.      Subjective Assessment - 11/27/15 0902    Subjective I felt okay after last visit, just had like a toothache in the front of my Lt shoulder joint. Not too bad today.    Pertinent History Colon cancer 2013 with resection.  Breast cancer on right 2016 with chemo (finished July 2016) and radiation (finished early September 2016).  HTN (in flux--pt. will talk to PCP about that).  Has a RTC tear in left shoulder and two cysts there, diagnosed by orthopedist.  Blood clot in neck spring 2016.  Had an UE  Doppler when the left arm swelling started and they found no evidence of blood clot for this episode.  Uses a cane for gait because of knee and ankle arthritis bilat. Orthopedist had her go ahead with rehab with [place] and wants to reassess in six weeks.   Patient Stated Goals More use of the left arm and get the swelling to go down.    Currently in Pain? Yes   Pain Score 3    Pain Location Shoulder   Pain Orientation Left;Anterior   Pain Descriptors / Indicators Aching   Pain Type Chronic pain   Pain Onset 1 to 4 weeks ago   Aggravating Factors  worse later in the day   Pain Relieving Factors resting on Rt side, hot water from bath                         [facility] Adult PT Treatment/Exercise - 11/27/15 0001    Shoulder Exercises: Supine   Horizontal ABduction AAROM;Left;5 reps   External Rotation AAROM;Left;5 reps   Flexion AAROM;5 reps;Both   Manual Therapy   Manual Lymphatic Drainage (MLD) In Supine with HOB elevated: Short neck, superficial and deep abdominals, Lt inguinal nodes, Lt axillo-inguinal anastomosis and Lt UE from dorsal hand to lateral shoulder.    Passive ROM In Supine with HOB elevated: Lt shoulder into flexion, abduction, and external rotation.                PT Education - 11/27/15 0909    Education provided Yes   Education Details Supine cane exercises   Person(s) Educated Patient   Methods Explanation;Demonstration;Handout   Comprehension Verbalized understanding;Returned demonstration;Need further instruction           Short Term Clinic Goals - 11/27/15 0939    CC Short Term Goal  #1   Title independent in HEP for left shoulder ROM and strengthening   Status On-going   CC Short Term Goal  #2   Title independent in self-manual lymph drainage for left UE   Status On-going   CC Short Term Goal  #3   Title will report at least 25% decrease in left UE pain   Status On-going             Long Term Clinic Goals - 11/18/15 2033    CC Long Term Goal  #1   Title will be knowledgeable about where and how to obtain a compression sleeve and glove for left UE   Time 8   Period Weeks   Status New   CC Long Term Goal  #2   Title will report at least 60% decrease in left UE pain   Time 8   Period Weeks   Status New   CC Long Term  Goal  #3   Title  circumference of left arm at 10 cm. proximal to ulnar styloid reduced to 28.4 cm. or less   Baseline 28.9 on eval   Time 8   Period Weeks   Status New   CC Long Term Goal  #4   Title left shoulder active flexion to at least 120 degrees for improved overhead reach   Time 8   Period Weeks   Status New   CC Long Term Goal  #5   Title left shoulder active abduction to at least 110 degrees for improved ADLs   Time 8   Period Weeks   Status New            Plan - 11/27/15 [id number]    Clinical Impression Statement Pt was able to relax a little better today with PROM and did well with supien cane exercises so issued for HEP.    Rehab Potential Good   PT Frequency 2x / week   PT Duration 2 weeks  to 8 weeks   PT Treatment/Interventions ADLs/Self Care Home Management;Moist Heat;DME Instruction;Patient/family education;Orthotic Fit/Training;Manual techniques;Manual lymph drainage;Compression bandaging;Passive range of motion;Taping;Therapeutic exercise;Cryotherapy   PT Next Visit Plan [place] dowel rod exercises to HEP. Continue manual lymph drainage for left UE and instruct patient in same; left shoulder P/AA/AROM; rotator cuff and shoulder strengthening exercises; work with patient to obtain a compression sleeve and glove; consider bandaging if pt. would like to pursue this   Consulted and Agree with Plan of Care Patient      Patient will benefit from skilled therapeutic intervention in order to improve the following deficits and impairments:  Decreased range of motion, Decreased strength, Impaired UE functional use, Pain, Increased edema  Visit Diagnosis: Pain in left shoulder  Stiffness of left shoulder, not elsewhere classified     Problem List Patient Active Problem List   Diagnosis Date Noted  . Constipation 12/16/2014  . Chemotherapy-induced nausea 12/16/2014  . Hyperglycemia 12/09/2014  . Internal jugular vein thrombosis ([place])   . Leukocytosis   . DVT (deep venous  thrombosis) ([place]) 12/02/2014  . Osteoarthritis of both feet 11/26/2014  . Poor venous access   . Colorectal cancer ([place]) 10/03/2014  . Breast cancer of lower-inner quadrant of right female breast ([place]) 09/27/2014  . Severe obesity (BMI >= 40) ([place]) 11/06/2013  . Anemia, iron deficiency 04/25/2013  . GERD (gastroesophageal reflux disease) 02/12/2013  . HTN (hypertension) 10/21/2011  . Hypothyroidism 10/21/2011  . Depression 10/21/2011  . Hyperlipidemia 10/21/2011  . Lung nodules 10/21/2011  . Vertigo 10/21/2011  . Vitamin D deficiency 10/21/2011    Otelia Limes, PTA 11/27/2015, 9:39 AM  [place], [place], [address] Phone: [phone]   Fax:  [phone]  Name: Jordan [name] MRN: [id number] Date of Birth: 1951-04-13

## 2015-11-27 NOTE — Patient Instructions (Signed)
Cancer Rehab [phone] SHOULDER: External Rotation - Supine (Cane)   Hold cane with both hands. Rotate arm away from body. Keep elbow on floor and next to body. Hold 5 seconds, _5-10__ reps per set, _2__ sets per day. Add towel to keep elbow at side.  Copyright  VHI. All rights reserved.  Cane Horizontal - Supine   With straight arms holding cane above shoulders, bring cane out to right, center, out to left, and back to above head. Hold 5 seconds, repeat _5-10__ times. Do _2__ times per day.  Copyright  VHI. All rights reserved.  Cane Exercise: Flexion   Lie on back, holding cane above chest. Keeping arms as straight as possible, lower cane toward floor beyond head. Hold __5__ seconds. Repeat _5-10___ times. Do _2___ sessions per day.  http://gt2.exer.us/91   Copyright  VHI. All rights reserved.

## 2015-12-02 ENCOUNTER — Ambulatory Visit: Payer: BC Managed Care – PPO | Admitting: Physical Therapy

## 2015-12-02 DIAGNOSIS — R2232 Localized swelling, mass and lump, left upper limb: Secondary | ICD-10-CM

## 2015-12-02 DIAGNOSIS — M79602 Pain in left arm: Secondary | ICD-10-CM

## 2015-12-02 DIAGNOSIS — M25612 Stiffness of left shoulder, not elsewhere classified: Secondary | ICD-10-CM

## 2015-12-02 NOTE — Therapy (Signed)
[place], [place], [address] Phone: [phone]   Fax:  [phone]  Physical Therapy Treatment  Patient Details  Name: Jordan [name] MRN: [id number] Date of Birth: 09/26/1950 Referring Provider: Dr. Lurline Del  Encounter Date: 12/02/2015      PT End of Session - 12/02/15 1537    Visit Number 4   Number of Visits 17   Date for PT Re-Evaluation 01/17/16   PT Start Time 1300   PT Stop Time 1351   PT Time Calculation (min) 51 min   Activity Tolerance Patient tolerated treatment well;Patient limited by pain   Behavior During Therapy [facility] for tasks assessed/performed      Past Medical History  Diagnosis Date  . Hyperlipidemia     takes Zetia and Zocor daily  . Thyroid disease   . Nasal congestion   . Leg swelling   . Constipation   . Nausea   . Generalized headaches     due to allergies, sinus  . PONV (postoperative nausea and vomiting)     pt states she is very easy to sedate  . Hypertension     takes Maxzide and Metoprolol daily  . Pneumonia     walking about 6-35yrs ago  . History of bronchitis     last time about 6-56yrs ago  . Hx of seasonal allergies     takes OTC allergy med nightly  . History of migraine     last one about 15+yrs ago  . Dizziness     r/t side effects from meds  . Vertigo     takes Meclizine prn  . Joint pain   . Back pain     buldging disc  . GERD (gastroesophageal reflux disease)     takes Protonix as needed  . Hemorrhoids   . History of colon polyps   . Mass of colon   . History of UTI   . Hypothyroidism     takes Synthroid daily  . Depression     takes Wellbutrin daily  . Wears glasses   . S/P radiation therapy 03/11/2015 through 04/25/2015     Right breast 4500 cGy in 25 sessions, right breast boost 1600 cGy in 8 sessions   . Tuberculosis     as little girl    . History  of hiatal hernia   . Arthritis     oa  . Cancer ([place])     colon  . Breast cancer ([place]) 10/17/2014    lower inner quadrant of the right breast    Past Surgical History  Procedure Laterality Date  . Cesarean section  1968, 1970  . Knee surgery  1998    right - arthroscopic  . Abdominal hysterectomy    . Exploratory laparotomy    . Carpal tunnel release    . Colonoscopy    . Esophagogastroduodenoscopy    . Partial colectomy  03/30/2012    Procedure: PARTIAL COLECTOMY;  Surgeon: [name], MD;  Location: [place];  Service: General;  Laterality: N/A;  . Appendectomy    . Radioactive seed guided mastectomy with axillary sentinel lymph node biopsy Right 10/17/2014    Procedure: RADIOACTIVE SEED GUIDED PARTIAL MASTECTOMY WITH AXILLARY SENTINEL LYMPH NODE BIOPSY;  Surgeon: [name], MD;  Location: [place];  Service: General;  Laterality: Right;  . Portacath placement    . Port-a-cath removal N/A 06/25/2015    Procedure: REMOVAL PORT-A-CATH;  Surgeon: Eddie Dibbles  Daiva Nakayama, MD;  Location: [place];  Service: General;  Laterality: N/A;    There were no vitals filed for this visit.      Subjective Assessment - 12/02/15 1303    Subjective Nothing new.  Did the dowel exercises that she was taught last time, but it hurt sometimes more than others.  The pain stopped after she came out of the position.  Cooked a big dinner Sunday and lifte heavy pans;  the shoulder pain wasn't that bad, but the hand swelled up and was painful.  She tried the massage Val  had done and it got some better; didn't have pain today.   Currently in Pain? Yes   Pain Score 5    Pain Location Shoulder   Pain Orientation Left   Pain Descriptors / Indicators Aching   Aggravating Factors  getting dressed, driving   Pain Relieving Factors rest            [facility] PT Assessment - 12/02/15 0001    AROM   Left Shoulder Flexion 108 Degrees  painful   Left Shoulder ABduction 85 Degrees                      [facility] Adult PT Treatment/Exercise - 12/02/15 0001    Shoulder Exercises: Supine   Other Supine Exercises reviewed HEP given last time for supine dowel exercise for bilat. shoulder flexion, but this was quite painful, as therapist had patient keep elbows straight and bring arms back down to her hips.  Needed a couple of minutes for pain to subside.  Did better with left shoulder ER with dowel.     Manual Therapy   Manual Lymphatic Drainage (MLD) Instructed patient in the following, with therapist demonstrating and having patient do some of each part:  resisted diaphragmatic breathing, short neck, left groin and axillo-inguinal anastomosis, and left UE from dorsal hand to shoulder.                PT Education - 12/02/15 1536    Education provided Yes   Education Details reviewed dowel but advised patient to hold off on these for a couple more days until shoulder pain subsides further; self-manual lymph drainage for left UE   Person(s) Educated Patient   Methods Explanation;Demonstration;Tactile cues;Verbal cues;Handout   Comprehension Verbalized understanding;Returned demonstration           Short Term Clinic Goals - 12/02/15 1542    CC Short Term Goal  #1   Title independent in HEP for left shoulder ROM and strengthening   Status On-going   CC Short Term Goal  #2   Title independent in self-manual lymph drainage for left UE   Status On-going   CC Short Term Goal  #3   Title will report at least 25% decrease in left UE pain   Status On-going             [facility] [facility] Goals - 11/18/15 2033    CC Long Term Goal  #1   Title will be knowledgeable about where and how to obtain a compression sleeve and glove for left UE   Time 8   Period Weeks   Status New   CC Long Term Goal  #2   Title will report at least 60% decrease in left UE pain   Time 8   Period Weeks   Status New   CC Long Term Goal  #3   Title circumference of left  arm  at 10 cm. proximal to ulnar styloid reduced to 28.4 cm. or less   Baseline 28.9 on eval   Time 8   Period Weeks   Status New   CC Long Term Goal  #4   Title left shoulder active flexion to at least 120 degrees for improved overhead reach   Time 8   Period Weeks   Status New   CC Long Term Goal  #5   Title left shoulder active abduction to at least 110 degrees for improved ADLs   Time 8   Period Weeks   Status New            Plan - 12/02/15 1537    Clinical Impression Statement Patient did show increased active left shoulder flexion today (up to 108 from 81 degrees) and just a couple degree increase in abduction.  Limited by shouder pain that increased, it seems, from cooking a lot and lifting heavy pots on Sunday.   Rehab Potential Good   PT Frequency 2x / week   PT Duration 2 weeks  to 8 weeks   PT Treatment/Interventions Therapeutic exercise;Manual lymph drainage;Patient/family education   PT Next Visit Plan Review manual lymph drainage prn; add left shoulder rotator cuff strengthening if able ([place] with yellow Theraband, perhaps)   PT Home Exercise Plan self-manual lymph drainage; hold off on dowel exercise until next visit due to recent increase in pain   Consulted and Agree with Plan of Care Patient      Patient will benefit from skilled therapeutic intervention in order to improve the following deficits and impairments:  Decreased range of motion, Decreased strength, Impaired UE functional use, Pain, Increased edema  Visit Diagnosis: Stiffness of left shoulder, not elsewhere classified  Localized swelling, mass, or lump of upper extremity, left  Pain In Left Arm     Problem List Patient Active Problem List   Diagnosis Date Noted  . Constipation 12/16/2014  . Chemotherapy-induced nausea 12/16/2014  . Hyperglycemia 12/09/2014  . Internal jugular vein thrombosis ([place])   . Leukocytosis   . DVT (deep venous thrombosis) ([place]) 12/02/2014  . Osteoarthritis of  both feet 11/26/2014  . Poor venous access   . Colorectal cancer ([place]) 10/03/2014  . Breast cancer of lower-inner quadrant of right female breast ([place]) 09/27/2014  . Severe obesity (BMI >= 40) ([place]) 11/06/2013  . Anemia, iron deficiency 04/25/2013  . GERD (gastroesophageal reflux disease) 02/12/2013  . HTN (hypertension) 10/21/2011  . Hypothyroidism 10/21/2011  . Depression 10/21/2011  . Hyperlipidemia 10/21/2011  . Lung nodules 10/21/2011  . Vertigo 10/21/2011  . Vitamin D deficiency 10/21/2011    Jazier Mcglamery 12/02/2015, 3:43 PM  [place], [place], [address] Phone: [phone]   Fax:  [phone]  Name: Jordan [name] MRN: [id number] Date of Birth: [birth year]    Serafina Royals, PT 12/02/2015 3:43 PM

## 2015-12-02 NOTE — Patient Instructions (Addendum)
Deep Effective Breath   Standing, sitting, or laying down, place both hands on the belly. Take a deep breath IN, expanding the belly; then breath OUT, contracting the belly. Repeat __5__ times. Do __2-3__ sessions per day and before your self massage.  http://gt2.exer.us/866   Copyright  VHI. All rights reserved.   Copyright  VHI. All rights reserved.  Axilla to Inguinal Nodes - Sweep   On involved side, make 5 circles at groin at panty line, then pump _5__ times from armpit along side of trunk to outer hip, making your other pathway. Do __1_ time per day.  Copyright  VHI. All rights reserved.  Arm Posterior: Elbow to Shoulder - Sweep   Pump _5__ times from back of elbow to top of shoulder. Then inner to outer upper arm _5_ times, then outer arm again _5_ times. Then back to the pathways _2-3_ times. Do _1__ time per day.  Copyright  VHI. All rights reserved.  ARM: [place] Wrist to Elbow - Sweep   Pump or stationary circles _5__ times from wrist to elbow making sure to do both sides of the forearm. Then retrace your steps to the outer arm, and the pathways _2-3_ times each. Do _1__ time per day.  Copyright  VHI. All rights reserved.  ARM: Dorsum of Hand to Shoulder - Sweep   Pump or stationary circles _5__ times on back of hand including knuckle spaces and individual fingers if needed working up towards the wrist, then retrace all your steps working back up the forearm, doing both sides; upper outer arm and back to your pathway _2-3_ times each. Then do 5 circles again at involved groin where you started! Good job!! Do __1_ time per day.  Copyright  VHI. All rights reserved.     Marland Kitchen

## 2015-12-04 ENCOUNTER — Ambulatory Visit: Payer: BC Managed Care – PPO

## 2015-12-04 DIAGNOSIS — M25612 Stiffness of left shoulder, not elsewhere classified: Secondary | ICD-10-CM

## 2015-12-04 DIAGNOSIS — R2232 Localized swelling, mass and lump, left upper limb: Secondary | ICD-10-CM

## 2015-12-04 DIAGNOSIS — M79602 Pain in left arm: Secondary | ICD-10-CM | POA: Diagnosis not present

## 2015-12-04 NOTE — Therapy (Signed)
[place], [place], [address] Phone: [phone]   Fax:  [phone]  Physical Therapy Treatment  Patient Details  Name: Jordan [name] MRN: [id number] Date of Birth: 1951/04/07 Referring Provider: Dr. Lurline Del  Encounter Date: 12/04/2015      PT End of Session - 12/04/15 1058    Visit Number 5   Number of Visits 17   Date for PT Re-Evaluation 01/17/16   PT Start Time 1017   PT Stop Time 1100   PT Time Calculation (min) 43 min   Activity Tolerance Patient tolerated treatment well;Patient limited by pain   Behavior During Therapy [facility] for tasks assessed/performed      Past Medical History  Diagnosis Date  . Hyperlipidemia     takes Zetia and Zocor daily  . Thyroid disease   . Nasal congestion   . Leg swelling   . Constipation   . Nausea   . Generalized headaches     due to allergies, sinus  . PONV (postoperative nausea and vomiting)     pt states she is very easy to sedate  . Hypertension     takes Maxzide and Metoprolol daily  . Pneumonia     walking about 6-39yrs ago  . History of bronchitis     last time about 6-99yrs ago  . Hx of seasonal allergies     takes OTC allergy med nightly  . History of migraine     last one about 15+yrs ago  . Dizziness     r/t side effects from meds  . Vertigo     takes Meclizine prn  . Joint pain   . Back pain     buldging disc  . GERD (gastroesophageal reflux disease)     takes Protonix as needed  . Hemorrhoids   . History of colon polyps   . Mass of colon   . History of UTI   . Hypothyroidism     takes Synthroid daily  . Depression     takes Wellbutrin daily  . Wears glasses   . S/P radiation therapy 03/11/2015 through 04/25/2015     Right breast 4500 cGy in 25 sessions, right breast boost 1600 cGy in 8 sessions   . Tuberculosis     as little girl    . History  of hiatal hernia   . Arthritis     oa  . Cancer ([place])     colon  . Breast cancer ([place]) 10/17/2014    lower inner quadrant of the right breast    Past Surgical History  Procedure Laterality Date  . Cesarean section  1968, 1970  . Knee surgery  1998    right - arthroscopic  . Abdominal hysterectomy    . Exploratory laparotomy    . Carpal tunnel release    . Colonoscopy    . Esophagogastroduodenoscopy    . Partial colectomy  03/30/2012    Procedure: PARTIAL COLECTOMY;  Surgeon: [name], MD;  Location: [place];  Service: General;  Laterality: N/A;  . Appendectomy    . Radioactive seed guided mastectomy with axillary sentinel lymph node biopsy Right 10/17/2014    Procedure: RADIOACTIVE SEED GUIDED PARTIAL MASTECTOMY WITH AXILLARY SENTINEL LYMPH NODE BIOPSY;  Surgeon: [name], MD;  Location: [place];  Service: General;  Laterality: Right;  . Portacath placement    . Port-a-cath removal N/A 06/25/2015    Procedure: REMOVAL PORT-A-CATH;  Surgeon: Eddie Dibbles  Daiva Nakayama, MD;  Location: [place];  Service: General;  Laterality: N/A;    There were no vitals filed for this visit.      Subjective Assessment - 12/04/15 1021    Subjective Nothing new since last visit except pain is just a little better. Haven't done any exercises since Monday since it's been hurting still. Tried the massage again ut it didn't help this time.    Pertinent History Colon cancer 2013 with resection.  Breast cancer on right 2016 with chemo (finished July 2016) and radiation (finished early September 2016).  HTN (in flux--pt. will talk to PCP about that).  Has a RTC tear in left shoulder and two cysts there, diagnosed by orthopedist.  Blood clot in neck spring 2016.  Had an UE  Doppler when the left arm swelling started and they found no evidence of blood clot for this episode.  Uses a cane for gait because of knee and ankle arthritis bilat. Orthopedist had her go ahead with rehab with [place] and wants to  reassess in six weeks.   Patient Stated Goals More use of the left arm and get the swelling to go down.   Currently in Pain? Yes   Pain Score 5    Pain Location Shoulder   Pain Orientation Left   Pain Descriptors / Indicators Aching   Pain Type Chronic pain   Pain Onset 1 to 4 weeks ago   Aggravating Factors  still bothering me from cooking Sunday   Pain Relieving Factors rest, sometimes manual lymph drainage                         [facility] Adult PT Treatment/Exercise - 12/04/15 0001    Shoulder Exercises: Pulleys   Flexion 2 minutes   ABduction 2 minutes  Into scaption   Manual Therapy   Soft tissue mobilization Gently to anterior Lt shoulder where pt c/o feeling a knot but had to stop after a few minutes as pt reported her tenderness was increasing   Manual Lymphatic Drainage (MLD) Instructed patient in the following, with therapist demonstrating and having patient do some of each part:  resisted diaphragmatic breathing, short neck, left groin and axillo-inguinal anastomosis, and left UE from dorsal hand to shoulder.                   Short Term Clinic Goals - 12/02/15 1542    CC Short Term Goal  #1   Title independent in HEP for left shoulder ROM and strengthening   Status On-going   CC Short Term Goal  #2   Title independent in self-manual lymph drainage for left UE   Status On-going   CC Short Term Goal  #3   Title will report at least 25% decrease in left UE pain   Status On-going             [place] [facility] Goals - 11/18/15 2033    CC Long Term Goal  #1   Title will be knowledgeable about where and how to obtain a compression sleeve and glove for left UE   Time 8   Period Weeks   Status New   CC Long Term Goal  #2   Title will report at least 60% decrease in left UE pain   Time 8   Period Weeks   Status New   CC Long Term Goal  #3   Title circumference of left arm at 10  cm. proximal to ulnar styloid reduced to 28.4 cm. or less    Baseline 28.9 on eval   Time 8   Period Weeks   Status New   CC Long Term Goal  #4   Title left shoulder active flexion to at least 120 degrees for improved overhead reach   Time 8   Period Weeks   Status New   CC Long Term Goal  #5   Title left shoulder active abduction to at least 110 degrees for improved ADLs   Time 8   Period Weeks   Status New            Plan - 12/04/15 1100    Clinical Impression Statement Pt had alot of pain today and could not tolerate PROM today so tried pulleys and pt was able to tolerate that though still had trouble relaxing fully. Tolerated manual lymph drainage until  towards end of session and shoulder started hurting her even at rest. She reports won't be doing any heavy lifting/cooking for awhile.    Rehab Potential Good   PT Frequency 2x / week   PT Duration 2 weeks  to 8 weeks   PT Treatment/Interventions Therapeutic exercise;Manual lymph drainage;Patient/family education   PT Next Visit Plan Review manual lymph drainage prn; add left shoulder rotator cuff strengthening if able ([place] with yellow Theraband, perhaps), cont AAROM. Ask PT about possibly adding ionto?   Consulted and Agree with Plan of Care Patient      Patient will benefit from skilled therapeutic intervention in order to improve the following deficits and impairments:  Decreased range of motion, Decreased strength, Impaired UE functional use, Pain, Increased edema  Visit Diagnosis: Stiffness of left shoulder, not elsewhere classified  Localized swelling, mass, or lump of upper extremity, left  Pain In Left Arm     Problem List Patient Active Problem List   Diagnosis Date Noted  . Constipation 12/16/2014  . Chemotherapy-induced nausea 12/16/2014  . Hyperglycemia 12/09/2014  . Internal jugular vein thrombosis ([place])   . Leukocytosis   . DVT (deep venous thrombosis) ([place]) 12/02/2014  . Osteoarthritis of both feet 11/26/2014  . Poor venous access   . Colorectal  cancer ([place]) 10/03/2014  . Breast cancer of lower-inner quadrant of right female breast ([place]) 09/27/2014  . Severe obesity (BMI >= 40) ([place]) 11/06/2013  . Anemia, iron deficiency 04/25/2013  . GERD (gastroesophageal reflux disease) 02/12/2013  . HTN (hypertension) 10/21/2011  . Hypothyroidism 10/21/2011  . Depression 10/21/2011  . Hyperlipidemia 10/21/2011  . Lung nodules 10/21/2011  . Vertigo 10/21/2011  . Vitamin D deficiency 10/21/2011    Otelia Limes, PTA 12/04/2015, 11:09 AM  [place], [place], [address] Phone: [phone]   Fax:  [phone]  Name: NANDANA BIRCH MRN: [id number] Date of Birth: 08/15/1951

## 2015-12-09 ENCOUNTER — Ambulatory Visit: Payer: BC Managed Care – PPO

## 2015-12-09 DIAGNOSIS — M25612 Stiffness of left shoulder, not elsewhere classified: Secondary | ICD-10-CM

## 2015-12-09 DIAGNOSIS — M79602 Pain in left arm: Secondary | ICD-10-CM | POA: Diagnosis not present

## 2015-12-09 DIAGNOSIS — R2232 Localized swelling, mass and lump, left upper limb: Secondary | ICD-10-CM

## 2015-12-09 DIAGNOSIS — M25512 Pain in left shoulder: Secondary | ICD-10-CM

## 2015-12-09 NOTE — Addendum Note (Signed)
Addended by: Jomarie Longs on: 12/09/2015 02:22 PM   Modules accepted: Orders

## 2015-12-09 NOTE — Therapy (Addendum)
[place], [place], [address] Phone: [phone]   Fax:  [phone]  Physical Therapy Treatment  Patient Details  Name: Jordan [name] MRN: [id number] Date of Birth: [birth year] Referring Provider: Dr. Lurline Del  Encounter Date: 12/09/2015      PT End of Session - 12/09/15 0933    Visit Number 6   Number of Visits 17   Date for PT Re-Evaluation 01/17/16   PT Start Time 0850   PT Stop Time 0932   PT Time Calculation (min) 42 min   Activity Tolerance Patient tolerated treatment well;Patient limited by pain   Behavior During Therapy [facility] for tasks assessed/performed      Past Medical History  Diagnosis Date  . Hyperlipidemia     takes Zetia and Zocor daily  . Thyroid disease   . Nasal congestion   . Leg swelling   . Constipation   . Nausea   . Generalized headaches     due to allergies, sinus  . PONV (postoperative nausea and vomiting)     pt states she is very easy to sedate  . Hypertension     takes Maxzide and Metoprolol daily  . Pneumonia     walking about 6-29yrs ago  . History of bronchitis     last time about 6-73yrs ago  . Hx of seasonal allergies     takes OTC allergy med nightly  . History of migraine     last one about 15+yrs ago  . Dizziness     r/t side effects from meds  . Vertigo     takes Meclizine prn  . Joint pain   . Back pain     buldging disc  . GERD (gastroesophageal reflux disease)     takes Protonix as needed  . Hemorrhoids   . History of colon polyps   . Mass of colon   . History of UTI   . Hypothyroidism     takes Synthroid daily  . Depression     takes Wellbutrin daily  . Wears glasses   . S/P radiation therapy 03/11/2015 through 04/25/2015     Right breast 4500 cGy in 25 sessions, right breast boost 1600 cGy in 8 sessions   . Tuberculosis     as little girl    . History  of hiatal hernia   . Arthritis     oa  . Cancer ([place])     colon  . Breast cancer ([place]) 10/17/2014    lower inner quadrant of the right breast    Past Surgical History  Procedure Laterality Date  . Cesarean section  1968, 1970  . Knee surgery  1998    right - arthroscopic  . Abdominal hysterectomy    . Exploratory laparotomy    . Carpal tunnel release    . Colonoscopy    . Esophagogastroduodenoscopy    . Partial colectomy  03/30/2012    Procedure: PARTIAL COLECTOMY;  Surgeon: [name], MD;  Location: [place];  Service: General;  Laterality: N/A;  . Appendectomy    . Radioactive seed guided mastectomy with axillary sentinel lymph node biopsy Right 10/17/2014    Procedure: RADIOACTIVE SEED GUIDED PARTIAL MASTECTOMY WITH AXILLARY SENTINEL LYMPH NODE BIOPSY;  Surgeon: [name], MD;  Location: [place];  Service: General;  Laterality: Right;  . Portacath placement    . Port-a-cath removal N/A 06/25/2015    Procedure: REMOVAL PORT-A-CATH;  Surgeon: Eddie Dibbles  Daiva Nakayama, MD;  Location: [place];  Service: General;  Laterality: N/A;    There were no vitals filed for this visit.      Subjective Assessment - 12/09/15 0854    Subjective Didn't do any heavy lifting or cooking this weekend so my shoulder isn't as bad today.   Pertinent History Colon cancer 2013 with resection.  Breast cancer on right 2016 with chemo (finished July 2016) and radiation (finished early September 2016).  HTN (in flux--pt. will talk to PCP about that).  Has a RTC tear in left shoulder and two cysts there, diagnosed by orthopedist.  Blood clot in neck spring 2016.  Had an UE  Doppler when the left arm swelling started and they found no evidence of blood clot for this episode.  Uses a cane for gait because of knee and ankle arthritis bilat. Orthopedist had her go ahead with rehab with [place] and wants to reassess in six weeks.   Patient Stated Goals More use of the left arm and get the swelling to go down.    Currently in Pain? Yes   Pain Score 4    Pain Location Shoulder   Pain Orientation Left   Pain Descriptors / Indicators Aching   Pain Type Chronic pain   Pain Onset More than a month ago   Pain Frequency Intermittent   Aggravating Factors  overdoing it around the house   Pain Relieving Factors rest            [facility] PT Assessment - 12/09/15 0001    AROM   Left Shoulder Flexion 120 Degrees  Still with pain, but less now   Left Shoulder ABduction 94 Degrees  Very painful                     [facility] Adult PT Treatment/Exercise - 12/09/15 0001    Shoulder Exercises: Pulleys   Flexion Other (comment)  4 minutes   ABduction 2 minutes;Other (comment)  Pt able to tolerate closer to abduction today   Shoulder Exercises: Therapy Ball   Flexion 5 reps   Flexion Limitations Had to stop as pt started having increased pain   Manual Therapy   Soft tissue mobilization --   Manual Lymphatic Drainage (MLD) Short neck, superficial and deep abdominals, left groin and axillo-inguinal anastomosis, and left UE from dorsal hand to shoulder.   Passive ROM In Supine with HOB elevated: Lt shoulder into flexion, abduction slow and gently to pts tolerance. Pt able to tolerate today with less pain.                    Short Term Clinic Goals - 12/02/15 1542    CC Short Term Goal  #1   Title independent in HEP for left shoulder ROM and strengthening   Status On-going   CC Short Term Goal  #2   Title independent in self-manual lymph drainage for left UE   Status On-going   CC Short Term Goal  #3   Title will report at least 25% decrease in left UE pain   Status On-going             Long Term Clinic Goals - 11/18/15 2033    CC Long Term Goal  #1   Title will be knowledgeable about where and how to obtain a compression sleeve and glove for left UE   Time 8   Period Weeks   Status New   CC Long Term  Goal  #2   Title will report at least 60% decrease in left UE pain    Time 8   Period Weeks   Status New   CC Long Term Goal  #3   Title circumference of left arm at 10 cm. proximal to ulnar styloid reduced to 28.4 cm. or less   Baseline 28.9 on eval   Time 8   Period Weeks   Status New   CC Long Term Goal  #4   Title left shoulder active flexion to at least 120 degrees for improved overhead reach   Time 8   Period Weeks   Status New   CC Long Term Goal  #5   Title left shoulder active abduction to at least 110 degrees for improved ADLs   Time 8   Period Weeks   Status New            Plan - 12/09/15 0934    Clinical Impression Statement Pt tolerated sessiona alittle better today with less pain, but still limited at end ROM by pain. AROM mearuements improved today. Pt goes out of town all next week for her nieces wedding.    Rehab Potential Good   PT Frequency 2x / week   PT Duration 2 weeks  to 8 weeks   PT Treatment/Interventions Therapeutic exercise;Manual lymph drainage;Patient/family education;Electrical Stimulation;Iontophoresis 4mg /ml Dexamethasone;ADLs/Self Care Home Management;Cryotherapy;Moist Heat;DME Instruction;Manual techniques;Compression bandaging;Passive range of motion;Vasopneumatic Device   PT Next Visit Plan Review manual lymph drainage prn; add left shoulder rotator cuff strengthening if able ([place] with yellow Theraband, perhaps), cont AAROM. Ask PT about possibly adding ionto?   Consulted and Agree with Plan of Care Patient      Patient will benefit from skilled therapeutic intervention in order to improve the following deficits and impairments:  Decreased range of motion, Decreased strength, Impaired UE functional use, Pain, Increased edema  Visit Diagnosis: Stiffness of left shoulder, not elsewhere classified - Plan: PT plan of care cert/re-cert  Localized swelling, mass, or lump of upper extremity, left - Plan: PT plan of care cert/re-cert  Pain in left shoulder - Plan: PT plan of care  cert/re-cert     Problem List Patient Active Problem List   Diagnosis Date Noted  . Constipation 12/16/2014  . Chemotherapy-induced nausea 12/16/2014  . Hyperglycemia 12/09/2014  . Internal jugular vein thrombosis ([place])   . Leukocytosis   . DVT (deep venous thrombosis) ([place]) 12/02/2014  . Osteoarthritis of both feet 11/26/2014  . Poor venous access   . Colorectal cancer ([place]) 10/03/2014  . Breast cancer of lower-inner quadrant of right female breast ([place]) 09/27/2014  . Severe obesity (BMI >= 40) ([place]) 11/06/2013  . Anemia, iron deficiency 04/25/2013  . GERD (gastroesophageal reflux disease) 02/12/2013  . HTN (hypertension) 10/21/2011  . Hypothyroidism 10/21/2011  . Depression 10/21/2011  . Hyperlipidemia 10/21/2011  . Lung nodules 10/21/2011  . Vertigo 10/21/2011  . Vitamin D deficiency 10/21/2011    SALISBURY,DONNA, PTA 12/09/2015, 2:21 PM  [place], [place], [address] Phone: [phone]   Fax:  [phone]  Name: MARILIN GALLETTI MRN: [id number] Date of Birth: Oct 28, 1950    Serafina Royals, PT 12/09/2015 2:21 PM

## 2015-12-10 ENCOUNTER — Encounter: Payer: BC Managed Care – PPO | Admitting: Physical Therapy

## 2015-12-11 ENCOUNTER — Ambulatory Visit: Payer: BC Managed Care – PPO

## 2015-12-11 DIAGNOSIS — M25612 Stiffness of left shoulder, not elsewhere classified: Secondary | ICD-10-CM

## 2015-12-11 DIAGNOSIS — R2232 Localized swelling, mass and lump, left upper limb: Secondary | ICD-10-CM

## 2015-12-11 DIAGNOSIS — M79602 Pain in left arm: Secondary | ICD-10-CM | POA: Diagnosis not present

## 2015-12-11 NOTE — Therapy (Signed)
[place], [place], [address] Phone: [phone]   Fax:  [phone]  Physical Therapy Treatment  Patient Details  Name: Jordan [name] MRN: [id number] Date of Birth: October 04, 1950 Referring Provider: Dr. Lurline Del  Encounter Date: 12/11/2015      PT End of Session - 12/11/15 0906    Visit Number 7   Number of Visits 17   Date for PT Re-Evaluation 01/17/16   PT Start Time 0845   PT Stop Time 0932   PT Time Calculation (min) 47 min   Activity Tolerance Patient tolerated treatment well;Patient limited by pain   Behavior During Therapy [facility] for tasks assessed/performed      Past Medical History  Diagnosis Date  . Hyperlipidemia     takes Zetia and Zocor daily  . Thyroid disease   . Nasal congestion   . Leg swelling   . Constipation   . Nausea   . Generalized headaches     due to allergies, sinus  . PONV (postoperative nausea and vomiting)     pt states she is very easy to sedate  . Hypertension     takes Maxzide and Metoprolol daily  . Pneumonia     walking about 6-27yr ago  . History of bronchitis     last time about 6-722yrago  . Hx of seasonal allergies     takes OTC allergy med nightly  . History of migraine     last one about 15+yrs ago  . Dizziness     r/t side effects from meds  . Vertigo     takes Meclizine prn  . Joint pain   . Back pain     buldging disc  . GERD (gastroesophageal reflux disease)     takes Protonix as needed  . Hemorrhoids   . History of colon polyps   . Mass of colon   . History of UTI   . Hypothyroidism     takes Synthroid daily  . Depression     takes Wellbutrin daily  . Wears glasses   . S/P radiation therapy 03/11/2015 through 04/25/2015     Right breast 4500 cGy in 25 sessions, right breast boost 1600 cGy in 8 sessions   . Tuberculosis     as little girl    . History  of hiatal hernia   . Arthritis     oa  . Cancer ([facility]    colon  . Breast cancer ([facility]2014    lower inner quadrant of the right breast    Past Surgical History  Procedure Laterality Date  . Cesarean section  1968, 1970  . Knee surgery  1998    right - arthroscopic  . Abdominal hysterectomy    . Exploratory laparotomy    . Carpal tunnel release    . Colonoscopy    . Esophagogastroduodenoscopy    . Partial colectomy  03/30/2012    Procedure: PARTIAL COLECTOMY;  Surgeon: JaGwenyth OberMD;  [facility] Service: General;  Laterality: N/A;  . Appendectomy    . Radioactive seed guided mastectomy with axillary sentinel lymph node biopsy Right 10/17/2014    Procedure: RADIOACTIVE SEED GUIDED PARTIAL MASTECTOMY WITH AXILLARY SENTINEL LYMPH NODE BIOPSY;  Surgeon: PaAutumn MessingII, MD;  [facility] Service: General;  Laterality: Right;  . Portacath placement    . Port-a-cath removal N/A 06/25/2015    Procedure: REMOVAL PORT-A-CATH;  Surgeon: PaEddie Dibbles  Daiva Nakayama, MD;  Location: [facility];  Service: General;  Laterality: N/A;    There were no vitals filed for this visit.      Subjective Assessment - 12/11/15 0849    Subjective My Lt shoulder still hurts, I do have another appt with my orthopedist when I get back from [facility] for the following week.    Pertinent History Colon cancer 2013 with resection.  Breast cancer on right 2016 with chemo (finished July 2016) and radiation (finished early September 2016).  HTN (in flux--pt. will talk to PCP about that).  Has a RTC tear in left shoulder and two cysts there, diagnosed by orthopedist.  Blood clot in neck spring 2016.  Had an UE  Doppler when the left arm swelling started and they found no evidence of blood clot for this episode.  Uses a cane for gait because of knee and ankle arthritis bilat. Orthopedist had her go ahead with rehab with [place] and wants to reassess in six weeks.   Patient Stated Goals More use of the left arm  and get the swelling to go down.   Currently in Pain? Yes   Pain Score 3    Pain Location Shoulder   Pain Orientation Left   Pain Descriptors / Indicators Aching   Pain Type Chronic pain   Pain Onset More than a month ago   Pain Frequency Intermittent   Aggravating Factors  overdoing it around the house   Pain Relieving Factors rest               LYMPHEDEMA/ONCOLOGY QUESTIONNAIRE - 12/11/15 0901    Right Upper Extremity Lymphedema   Across Hand at Thumb Web Space 20.2 cm   Left Upper Extremity Lymphedema   10 cm Proximal to Olecranon Process 37.8 cm   Olecranon Process 30.6 cm   10 cm Proximal to Ulnar Styloid Process 28.8 cm   Just Proximal to Ulnar Styloid Process 20.4 cm   Across Hand at [employer] 21.2 cm   At [place] of 2nd Digit 7.3 cm                  [facility] Adult PT Treatment/Exercise - 12/11/15 0001    Shoulder Exercises: Pulleys   Flexion 2 minutes   ABduction 1 minute   ABduction Limitations Had to stop due to pain   Shoulder Exercises: ROM/Strengthening   Other ROM/Strengthening Exercises Tried flexion with arm resting on cane and follow with scapular retraction but very painful for pt so stopped.    Manual Therapy   Manual Lymphatic Drainage (MLD) Short neck, superficial and deep abdominals, left groin and axillo-inguinal anastomosis, and left UE from dorsal hand to shoulder.                   Short Term Clinic Goals - 12/11/15 0911    CC Short Term Goal  #1   Title independent in HEP for left shoulder ROM and strengthening   Status On-going   CC Short Term Goal  #2   Title independent in self-manual lymph drainage for left UE   Status Partially Met   CC Short Term Goal  #3   Title will report at least 25% decrease in left UE pain   Status Not Met             Long Term Clinic Goals - 12/11/15 0911    CC Long Term Goal  #1   Title will be knowledgeable about where  and how to obtain a compression sleeve and glove for  left UE   Status On-going   CC Long Term Goal  #2   Title will report at least 60% decrease in left UE pain   Status Not Met   CC Long Term Goal  #3   Title circumference of left arm at 10 cm. proximal to ulnar styloid reduced to 28.4 cm. or less   Baseline 28.9 on eval, 28.8 cm 12/11/15   Status On-going   CC Long Term Goal  #4   Title left shoulder active flexion to at least 120 degrees for improved overhead reach   Baseline 120 degrees 12/09/15 with pain at end ROM   Status Achieved   CC Long Term Goal  #5   Title left shoulder active abduction to at least 110 degrees for improved ADLs   Baseline 94 degrees 12/11/15 with pain at end ROM   Status On-going            Plan - 12/11/15 0906    Clinical Impression Statement Tried warming up with pulleys but pt unable to tolerate any scaption motiuon today. Also tried using pts cane for AAROM (like the UE ranger) for flexion then back into scapular retraction and this was painful as well. Discussed with pt after talking with Serafina Royals, PT, that pt should consider seeing her orthopedist again since her pain overall hasn't improved, and in fact is worse after gentle ROM during therapy. She agreed and reported that she has an appt already scheduled with her orthopedist week after next. Pt is going to be on hold until then as she hurts more after therapy. Her circumference measurements were unchanged from last measurement except ehr wrist was smaller. Her Lt hand though is 1 cm larger than the Rt.    Rehab Potential Good   PT Frequency 2x / week   PT Duration 2 weeks  to 8 weeks   PT Treatment/Interventions Therapeutic exercise;Manual lymph drainage;Patient/family education;Electrical Stimulation;Iontophoresis 73m/ml Dexamethasone;ADLs/Self Care Home Management;Cryotherapy;Moist Heat;DME Instruction;Manual techniques;Compression bandaging;Passive range of motion;Vasopneumatic Device   PT Next Visit Plan Pt on hold until she sees her  orthopedist again. Pt to call and update [place].    Consulted and Agree with Plan of Care Patient      Patient will benefit from skilled therapeutic intervention in order to improve the following deficits and impairments:  Decreased range of motion, Decreased strength, Impaired UE functional use, Pain, Increased edema  Visit Diagnosis: Stiffness of left shoulder, not elsewhere classified  Localized swelling, mass, or lump of upper extremity, left     Problem List Patient Active Problem List   Diagnosis Date Noted  . Constipation 12/16/2014  . Chemotherapy-induced nausea 12/16/2014  . Hyperglycemia 12/09/2014  . Internal jugular vein thrombosis ([facility]   . Leukocytosis   . DVT (deep venous thrombosis) ([facility] 12/02/2014  . Osteoarthritis of both feet 11/26/2014  . Poor venous access   . Colorectal cancer ([facility] 10/03/2014  . Breast cancer of lower-inner quadrant of right female breast ([facility] 09/27/2014  . Severe obesity (BMI >= 40) ([facility] 11/06/2013  . Anemia, iron deficiency 04/25/2013  . GERD (gastroesophageal reflux disease) 02/12/2013  . HTN (hypertension) 10/21/2011  . Hypothyroidism 10/21/2011  . Depression 10/21/2011  . Hyperlipidemia 10/21/2011  . Lung nodules 10/21/2011  . Vertigo 10/21/2011  . Vitamin D deficiency 10/21/2011    ROtelia Limes PTA 12/11/2015, 9:36 AM  [facility]  [place], [place], [address] Phone: [phone]   Fax:  [phone]  Name: Jordan [name] MRN: [id number] Date of Birth: 10-04-1950

## 2015-12-12 ENCOUNTER — Encounter: Payer: BC Managed Care – PPO | Admitting: Physical Therapy

## 2015-12-15 ENCOUNTER — Encounter: Payer: BC Managed Care – PPO | Admitting: Physical Therapy

## 2015-12-23 ENCOUNTER — Encounter: Payer: Self-pay | Admitting: Physician Assistant

## 2015-12-23 ENCOUNTER — Ambulatory Visit (INDEPENDENT_AMBULATORY_CARE_PROVIDER_SITE_OTHER): Payer: Medicare Other | Admitting: Physician Assistant

## 2015-12-23 VITALS — BP 112/74 | HR 87 | Temp 98.5°F | Resp 16 | Ht 61.5 in | Wt 224.2 lb

## 2015-12-23 DIAGNOSIS — K219 Gastro-esophageal reflux disease without esophagitis: Secondary | ICD-10-CM | POA: Diagnosis not present

## 2015-12-23 DIAGNOSIS — E039 Hypothyroidism, unspecified: Secondary | ICD-10-CM

## 2015-12-23 DIAGNOSIS — M19071 Primary osteoarthritis, right ankle and foot: Secondary | ICD-10-CM

## 2015-12-23 DIAGNOSIS — M25512 Pain in left shoulder: Secondary | ICD-10-CM | POA: Diagnosis not present

## 2015-12-23 DIAGNOSIS — E785 Hyperlipidemia, unspecified: Secondary | ICD-10-CM | POA: Diagnosis not present

## 2015-12-23 DIAGNOSIS — I10 Essential (primary) hypertension: Secondary | ICD-10-CM

## 2015-12-23 DIAGNOSIS — M7989 Other specified soft tissue disorders: Secondary | ICD-10-CM | POA: Diagnosis not present

## 2015-12-23 DIAGNOSIS — F329 Major depressive disorder, single episode, unspecified: Secondary | ICD-10-CM | POA: Diagnosis not present

## 2015-12-23 DIAGNOSIS — M19072 Primary osteoarthritis, left ankle and foot: Secondary | ICD-10-CM

## 2015-12-23 DIAGNOSIS — E559 Vitamin D deficiency, unspecified: Secondary | ICD-10-CM

## 2015-12-23 DIAGNOSIS — F32A Depression, unspecified: Secondary | ICD-10-CM

## 2015-12-23 MED ORDER — LORAZEPAM 0.5 MG PO TABS: ORAL_TABLET | ORAL | Status: DC

## 2015-12-23 MED ORDER — MELOXICAM 15 MG PO TABS: 15.0000 mg | ORAL_TABLET | Freq: Every day | ORAL | Status: DC

## 2015-12-23 MED ORDER — EZETIMIBE 10 MG PO TABS: 10.0000 mg | ORAL_TABLET | Freq: Every day | ORAL | Status: DC

## 2015-12-23 MED ORDER — HYDROCODONE-ACETAMINOPHEN 5-325 MG PO TABS: 1.0000 | ORAL_TABLET | Freq: Four times a day (QID) | ORAL | Status: DC | PRN

## 2015-12-23 MED ORDER — OXYCODONE-ACETAMINOPHEN 5-325 MG PO TABS: 1.0000 | ORAL_TABLET | ORAL | Status: DC | PRN

## 2015-12-23 MED ORDER — TRIAMTERENE-HCTZ 37.5-25 MG PO TABS: ORAL_TABLET | ORAL | Status: DC

## 2015-12-23 MED ORDER — BUPROPION HCL ER (XL) 150 MG PO TB24: ORAL_TABLET | ORAL | Status: DC

## 2015-12-23 MED ORDER — LEVOTHYROXINE SODIUM 125 MCG PO TABS: 125.0000 ug | ORAL_TABLET | Freq: Every day | ORAL | Status: DC

## 2015-12-23 MED ORDER — SIMVASTATIN 40 MG PO TABS: 40.0000 mg | ORAL_TABLET | Freq: Every day | ORAL | Status: DC

## 2015-12-23 NOTE — Patient Instructions (Signed)
     IF you received an x-ray today, you will receive an invoice from [facility]. Please contact [facility] at [phone] with questions or concerns regarding your invoice.   IF you received labwork today, you will receive an invoice from [facility]. Please contact [facility] at [phone] with questions or concerns regarding your invoice.   Our billing staff will not be able to assist you with questions regarding bills from these companies.  You will be contacted with the lab results as soon as they are available. The fastest way to get your results is to activate your My Chart account. Instructions are located on the last page of this paperwork. If you have not heard from us regarding the results in 2 weeks, please contact this office.      
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
## 2015-12-24 ENCOUNTER — Ambulatory Visit: Payer: BC Managed Care – PPO | Admitting: Physical Therapy

## 2015-12-26 ENCOUNTER — Encounter: Payer: BC Managed Care – PPO | Admitting: Physical Therapy

## 2015-12-26 DIAGNOSIS — M7502 Adhesive capsulitis of left shoulder: Secondary | ICD-10-CM | POA: Diagnosis not present

## 2015-12-31 DIAGNOSIS — M7502 Adhesive capsulitis of left shoulder: Secondary | ICD-10-CM | POA: Diagnosis not present

## 2016-01-26 DIAGNOSIS — G8929 Other chronic pain: Secondary | ICD-10-CM | POA: Diagnosis not present

## 2016-01-26 DIAGNOSIS — M25512 Pain in left shoulder: Secondary | ICD-10-CM | POA: Diagnosis not present

## 2016-01-27 ENCOUNTER — Ambulatory Visit: Payer: Medicare Other | Admitting: Physician Assistant

## 2016-01-28 DIAGNOSIS — C50311 Malignant neoplasm of lower-inner quadrant of right female breast: Secondary | ICD-10-CM | POA: Diagnosis not present

## 2016-02-25 DIAGNOSIS — M25512 Pain in left shoulder: Secondary | ICD-10-CM | POA: Diagnosis not present

## 2016-02-25 DIAGNOSIS — M7542 Impingement syndrome of left shoulder: Secondary | ICD-10-CM | POA: Diagnosis not present

## 2016-02-25 DIAGNOSIS — M19012 Primary osteoarthritis, left shoulder: Secondary | ICD-10-CM | POA: Diagnosis not present

## 2016-02-25 DIAGNOSIS — G8929 Other chronic pain: Secondary | ICD-10-CM | POA: Diagnosis not present

## 2016-03-02 ENCOUNTER — Encounter (HOSPITAL_COMMUNITY)
Admission: RE | Admit: 2016-03-02 | Discharge: 2016-03-02 | Disposition: A | Discharge status: Home / Self Care | Payer: Medicare Other | Source: Ambulatory Visit | Attending: Orthopedic Surgery | Admitting: Orthopedic Surgery

## 2016-03-02 ENCOUNTER — Encounter (HOSPITAL_COMMUNITY): Payer: Self-pay

## 2016-03-02 DIAGNOSIS — I10 Essential (primary) hypertension: Secondary | ICD-10-CM | POA: Diagnosis not present

## 2016-03-02 DIAGNOSIS — J302 Other seasonal allergic rhinitis: Secondary | ICD-10-CM | POA: Diagnosis not present

## 2016-03-02 DIAGNOSIS — E039 Hypothyroidism, unspecified: Secondary | ICD-10-CM | POA: Diagnosis not present

## 2016-03-02 DIAGNOSIS — E785 Hyperlipidemia, unspecified: Secondary | ICD-10-CM | POA: Diagnosis not present

## 2016-03-02 DIAGNOSIS — M19012 Primary osteoarthritis, left shoulder: Secondary | ICD-10-CM | POA: Diagnosis not present

## 2016-03-02 DIAGNOSIS — K219 Gastro-esophageal reflux disease without esophagitis: Secondary | ICD-10-CM | POA: Diagnosis not present

## 2016-03-02 DIAGNOSIS — Z85038 Personal history of other malignant neoplasm of large intestine: Secondary | ICD-10-CM | POA: Diagnosis not present

## 2016-03-02 DIAGNOSIS — I739 Peripheral vascular disease, unspecified: Secondary | ICD-10-CM | POA: Diagnosis not present

## 2016-03-02 DIAGNOSIS — Z9049 Acquired absence of other specified parts of digestive tract: Secondary | ICD-10-CM | POA: Diagnosis not present

## 2016-03-02 DIAGNOSIS — K449 Diaphragmatic hernia without obstruction or gangrene: Secondary | ICD-10-CM | POA: Diagnosis not present

## 2016-03-02 DIAGNOSIS — Z923 Personal history of irradiation: Secondary | ICD-10-CM | POA: Diagnosis not present

## 2016-03-02 DIAGNOSIS — F329 Major depressive disorder, single episode, unspecified: Secondary | ICD-10-CM | POA: Diagnosis not present

## 2016-03-02 HISTORY — DX: Acute embolism and thrombosis of unspecified vein: I82.90

## 2016-03-02 HISTORY — DX: Acute embolism and thrombosis of other specified veins: I82.890

## 2016-03-02 HISTORY — DX: Anemia, unspecified: D64.9

## 2016-03-02 HISTORY — DX: Family history of other specified conditions: Z84.89

## 2016-03-02 LAB — BASIC METABOLIC PANEL
Anion gap: 10 (ref 5–15)
BUN: 12 mg/dL (ref 6–20)
CALCIUM: 9 mg/dL (ref 8.9–10.3)
CO2: 23 mmol/L (ref 22–32)
Chloride: 108 mmol/L (ref 101–111)
Creatinine, Ser: 1.02 mg/dL — ABNORMAL HIGH (ref 0.44–1.00)
GFR calc Af Amer: >60 mL/min (ref >60)
GFR, EST NON AFRICAN AMERICAN: 56 mL/min — AB (ref >60)
GLUCOSE: 103 mg/dL — AB (ref 65–99)
Potassium: 3.6 mmol/L (ref 3.5–5.1)
Sodium: 141 mmol/L (ref 135–145)

## 2016-03-02 LAB — CBC
HCT: 46.5 % — ABNORMAL HIGH (ref 36.0–46.0)
Hemoglobin: 15 g/dL (ref 12.0–15.0)
MCH: 28.3 pg (ref 26.0–34.0)
MCHC: 32.3 g/dL (ref 30.0–36.0)
MCV: 87.7 fL (ref 78.0–100.0)
Platelets: 252 10*3/uL (ref 150–400)
RBC: 5.3 MIL/uL — ABNORMAL HIGH (ref 3.87–5.11)
RDW: 15.3 % (ref 11.5–15.5)
WBC: 7.7 10*3/uL (ref 4.0–10.5)

## 2016-03-02 LAB — SURGICAL PCR SCREEN
MRSA, PCR: NEGATIVE
STAPHYLOCOCCUS AUREUS: NEGATIVE

## 2016-03-02 NOTE — Pre-Procedure Instructions (Signed)
Kiarra SAMMI APRAHAMIAN  03/02/2016      [facility] - [address], [address] [place] [address] Phone: [phone] Fax: [phone]  [place], [place] - [address] [place] [address] Phone: [phone] Fax: [phone]  [id number] - [place], [place] - [address] [address] Phone: [phone] Fax: [phone]    Your procedure is scheduled on Thursday, March 04, 2016  Report to [facility] Admitting at 10:45 A.M.  Call this number if you have problems the morning of surgery:  [phone]   Remember:  Do not eat food or drink liquids after midnight Wednesday, March 03, 2016  Take these medicines the morning of surgery with A SIP OF WATER : buPROPion (WELLBUTRIN XL),  levothyroxine (SYNTHROID, LEVOTHROID), if needed:Oxycodone for pain,  nitroGLYCERIN (NITROSTAT) for chest pian Stop taking Aspirin, vitamins, fish oil and herbal medications. Do not take any NSAIDs ie: Ibuprofen, Advil, Naproxen, BC and Goody Powder or any medication containing Aspirin such as meloxicam (MOBIC)  ; stop now.  Do not wear jewelry, make-up or nail polish.  Do not wear lotions, powders, or perfumes.  You may not wear deoderant.  Do not shave 48 hours prior to surgery.    Do not bring valuables to the hospital.  [facility] is not responsible for any belongings or valuables.  Contacts, dentures or bridgework may not be worn into surgery.  Leave your suitcase in the car.  After surgery it may be brought to your room.  For patients admitted to the hospital, discharge time will be determined by your treatment team.  Patients discharged the day of surgery will not be allowed to drive home.   Name and phone number of your driver:  Special instructions:  [place] -  Preparing for Surgery  Before surgery, you can play an important role.  Because skin is not sterile, your skin needs to be as free of germs as possible.  You can reduce the number of germs on you skin by washing with CHG (chlorahexidine gluconate) soap before surgery.  CHG is an antiseptic cleaner which kills germs and bonds with the skin to continue killing germs even after washing.  Please DO NOT use if you have an allergy to CHG or antibacterial soaps.  If your skin becomes reddened/irritated stop using the CHG and inform your nurse when you arrive at Short Stay.  Do not shave (including legs and underarms) for at least 48 hours prior to the first CHG shower.  You may shave your face.  Please follow these instructions carefully:   1.  Shower with CHG Soap the night before surgery and the morning of Surgery.  2.  If you choose to wash your hair, wash your hair first as usual with your normal shampoo.  3.  After you shampoo, rinse your hair and body thoroughly to remove the Shampoo.  4.  Use CHG as you would any other liquid soap.  You can apply chg directly  to the skin and wash gently with scrungie or a clean washcloth.  5.  Apply the CHG Soap to your body ONLY FROM THE NECK DOWN.  Do not use on open wounds or open sores.  Avoid contact with your eyes, ears, mouth and genitals (private parts).  Wash  genitals (private parts) with your normal soap.  6.  Wash thoroughly, paying special attention to the area where your surgery will be performed.  7.  Thoroughly rinse your body with warm water from the neck down.  8.  DO NOT shower/wash with your normal soap after using and rinsing off the CHG Soap.  9.  Pat yourself dry with a clean towel.            10.  Wear clean pajamas.            11.  Place clean sheets on your bed the night of your first shower and do not sleep with pets.  Day of Surgery  Do not apply any lotions/deodorants the morning of surgery.  Please wear clean clothes to the  hospital/surgery center.  Please read over the following fact sheets that you were given. Pain Booklet, Coughing and Deep Breathing, MRSA Information and Surgical Site Infection Prevention

## 2016-03-02 NOTE — Progress Notes (Signed)
Pt denies SOB, chest pain, and being under the care of a cardiologist. Pt denies having an EKG and chest x ray within the last year. Pt denies having lab work in the last 2 weeks.
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
## 2016-03-02 NOTE — Progress Notes (Signed)
   03/02/16 0910  OBSTRUCTIVE SLEEP APNEA  Have you ever been diagnosed with sleep apnea through a sleep study? No  Do you snore loudly (loud enough to be heard through closed doors)?  1  Do you often feel tired, fatigued, or sleepy during the daytime (such as falling asleep during driving or talking to someone)? 0  Has anyone observed you stop breathing during your sleep? 0  Do you have, or are you being treated for high blood pressure? 1  BMI more than 35 kg/m2? 1  Age > 50 (1-yes) 1  Neck circumference greater than:Female 16 inches or larger, Female 17inches or larger? 1  Female Gender (Yes=1) 0  Obstructive Sleep Apnea Score 5

## 2016-03-03 MED ORDER — TRANEXAMIC ACID 1000 MG/10ML IV SOLN
1000.0000 mg | INTRAVENOUS | Status: DC
  Filled 2016-03-03: qty 10

## 2016-03-03 MED ORDER — TRANEXAMIC ACID 1000 MG/10ML IV SOLN
1000.0000 mg | INTRAVENOUS | Status: AC
  Administered 2016-03-04: 1000 mg via INTRAVENOUS
  Filled 2016-03-03: qty 10

## 2016-03-04 ENCOUNTER — Inpatient Hospital Stay (HOSPITAL_COMMUNITY): Payer: Medicare Other | Admitting: Certified Registered Nurse Anesthetist

## 2016-03-04 ENCOUNTER — Inpatient Hospital Stay (HOSPITAL_COMMUNITY)
Admission: RE | Admit: 2016-03-04 | Discharge: 2016-03-05 | DRG: 483 | Disposition: A | Discharge status: Home Health | Payer: Medicare Other | Source: Ambulatory Visit | Attending: Orthopedic Surgery | Admitting: Orthopedic Surgery

## 2016-03-04 ENCOUNTER — Encounter (HOSPITAL_COMMUNITY): Payer: Self-pay | Admitting: Surgery

## 2016-03-04 ENCOUNTER — Encounter (HOSPITAL_COMMUNITY)
Admission: RE | Disposition: A | Discharge status: Home Health | Payer: Self-pay | Source: Ambulatory Visit | Attending: Orthopedic Surgery

## 2016-03-04 DIAGNOSIS — E785 Hyperlipidemia, unspecified: Secondary | ICD-10-CM | POA: Diagnosis present

## 2016-03-04 DIAGNOSIS — Z7982 Long term (current) use of aspirin: Secondary | ICD-10-CM

## 2016-03-04 DIAGNOSIS — I10 Essential (primary) hypertension: Secondary | ICD-10-CM | POA: Diagnosis not present

## 2016-03-04 DIAGNOSIS — Z923 Personal history of irradiation: Secondary | ICD-10-CM

## 2016-03-04 DIAGNOSIS — Z96612 Presence of left artificial shoulder joint: Secondary | ICD-10-CM

## 2016-03-04 DIAGNOSIS — Z79899 Other long term (current) drug therapy: Secondary | ICD-10-CM

## 2016-03-04 DIAGNOSIS — E039 Hypothyroidism, unspecified: Secondary | ICD-10-CM | POA: Diagnosis present

## 2016-03-04 DIAGNOSIS — Z85038 Personal history of other malignant neoplasm of large intestine: Secondary | ICD-10-CM

## 2016-03-04 DIAGNOSIS — Z96619 Presence of unspecified artificial shoulder joint: Secondary | ICD-10-CM

## 2016-03-04 DIAGNOSIS — Z809 Family history of malignant neoplasm, unspecified: Secondary | ICD-10-CM

## 2016-03-04 DIAGNOSIS — Z87440 Personal history of urinary (tract) infections: Secondary | ICD-10-CM | POA: Diagnosis not present

## 2016-03-04 DIAGNOSIS — Z853 Personal history of malignant neoplasm of breast: Secondary | ICD-10-CM | POA: Diagnosis not present

## 2016-03-04 DIAGNOSIS — K449 Diaphragmatic hernia without obstruction or gangrene: Secondary | ICD-10-CM | POA: Diagnosis present

## 2016-03-04 DIAGNOSIS — Z9049 Acquired absence of other specified parts of digestive tract: Secondary | ICD-10-CM | POA: Diagnosis not present

## 2016-03-04 DIAGNOSIS — F329 Major depressive disorder, single episode, unspecified: Secondary | ICD-10-CM | POA: Diagnosis not present

## 2016-03-04 DIAGNOSIS — J302 Other seasonal allergic rhinitis: Secondary | ICD-10-CM | POA: Diagnosis present

## 2016-03-04 DIAGNOSIS — G8918 Other acute postprocedural pain: Secondary | ICD-10-CM | POA: Diagnosis not present

## 2016-03-04 DIAGNOSIS — M19012 Primary osteoarthritis, left shoulder: Principal | ICD-10-CM | POA: Diagnosis present

## 2016-03-04 DIAGNOSIS — I739 Peripheral vascular disease, unspecified: Secondary | ICD-10-CM | POA: Diagnosis not present

## 2016-03-04 DIAGNOSIS — K219 Gastro-esophageal reflux disease without esophagitis: Secondary | ICD-10-CM | POA: Diagnosis present

## 2016-03-04 HISTORY — PX: TOTAL SHOULDER ARTHROPLASTY: SHX126

## 2016-03-04 SURGERY — ARTHROPLASTY, SHOULDER, TOTAL
Anesthesia: Regional | Site: Shoulder | Laterality: Left

## 2016-03-04 MED ORDER — CHLORHEXIDINE GLUCONATE 4 % EX LIQD: 60.0000 mL | Freq: Once | CUTANEOUS | Status: DC

## 2016-03-04 MED ORDER — PROPOFOL 10 MG/ML IV BOLUS
INTRAVENOUS | Status: AC
  Filled 2016-03-04: qty 40

## 2016-03-04 MED ORDER — MIDAZOLAM HCL 2 MG/2ML IJ SOLN
1.0000 mg | Freq: Once | INTRAMUSCULAR | Status: AC
  Administered 2016-03-04: 1 mg via INTRAVENOUS

## 2016-03-04 MED ORDER — BUPROPION HCL ER (XL) 150 MG PO TB24
150.0000 mg | ORAL_TABLET | Freq: Every day | ORAL | Status: DC
  Administered 2016-03-05: 150 mg via ORAL
  Filled 2016-03-04 (×2): qty 1

## 2016-03-04 MED ORDER — SUGAMMADEX SODIUM 200 MG/2ML IV SOLN
INTRAVENOUS | Status: DC | PRN
  Administered 2016-03-04: 200 mg via INTRAVENOUS

## 2016-03-04 MED ORDER — ONDANSETRON HCL 4 MG/2ML IJ SOLN: 4.0000 mg | Freq: Four times a day (QID) | INTRAMUSCULAR | Status: DC | PRN

## 2016-03-04 MED ORDER — CEFAZOLIN SODIUM-DEXTROSE 2-4 GM/100ML-% IV SOLN
2.0000 g | Freq: Four times a day (QID) | INTRAVENOUS | Status: AC
Start: 2016-03-04 — End: 2016-03-05
  Administered 2016-03-04 – 2016-03-05 (×3): 2 g via INTRAVENOUS
  Filled 2016-03-04 (×4): qty 100

## 2016-03-04 MED ORDER — METOCLOPRAMIDE HCL 5 MG PO TABS: 5.0000 mg | ORAL_TABLET | Freq: Three times a day (TID) | ORAL | Status: DC | PRN

## 2016-03-04 MED ORDER — ONDANSETRON HCL 4 MG PO TABS: 4.0000 mg | ORAL_TABLET | Freq: Four times a day (QID) | ORAL | Status: DC | PRN

## 2016-03-04 MED ORDER — PHENOL 1.4 % MT LIQD: 1.0000 | OROMUCOSAL | Status: DC | PRN

## 2016-03-04 MED ORDER — ACETAMINOPHEN 650 MG RE SUPP: 650.0000 mg | Freq: Four times a day (QID) | RECTAL | Status: DC | PRN

## 2016-03-04 MED ORDER — ONDANSETRON HCL 4 MG/2ML IJ SOLN
INTRAMUSCULAR | Status: DC | PRN
  Administered 2016-03-04: 4 mg via INTRAVENOUS

## 2016-03-04 MED ORDER — ALUM & MAG HYDROXIDE-SIMETH 200-200-20 MG/5ML PO SUSP: 30.0000 mL | ORAL | Status: DC | PRN

## 2016-03-04 MED ORDER — PHENYLEPHRINE 40 MCG/ML (10ML) SYRINGE FOR IV PUSH (FOR BLOOD PRESSURE SUPPORT)
PREFILLED_SYRINGE | INTRAVENOUS | Status: AC
  Filled 2016-03-04: qty 10

## 2016-03-04 MED ORDER — 0.9 % SODIUM CHLORIDE (POUR BTL) OPTIME
TOPICAL | Status: DC | PRN
Start: 2016-03-04 — End: 2016-03-04
  Administered 2016-03-04: 1000 mL

## 2016-03-04 MED ORDER — ROCURONIUM BROMIDE 100 MG/10ML IV SOLN
INTRAVENOUS | Status: DC | PRN
  Administered 2016-03-04: 50 mg via INTRAVENOUS

## 2016-03-04 MED ORDER — ONDANSETRON HCL 4 MG/2ML IJ SOLN
INTRAMUSCULAR | Status: AC
  Filled 2016-03-04: qty 2

## 2016-03-04 MED ORDER — MIDAZOLAM HCL 5 MG/5ML IJ SOLN
INTRAMUSCULAR | Status: DC | PRN
  Administered 2016-03-04 (×2): 1 mg via INTRAVENOUS

## 2016-03-04 MED ORDER — FENTANYL CITRATE (PF) 250 MCG/5ML IJ SOLN
INTRAMUSCULAR | Status: AC
  Filled 2016-03-04: qty 5

## 2016-03-04 MED ORDER — DIPHENHYDRAMINE HCL 25 MG PO CAPS: 25.0000 mg | ORAL_CAPSULE | Freq: Four times a day (QID) | ORAL | Status: DC | PRN

## 2016-03-04 MED ORDER — SUGAMMADEX SODIUM 200 MG/2ML IV SOLN
INTRAVENOUS | Status: AC
  Filled 2016-03-04: qty 2

## 2016-03-04 MED ORDER — POLYETHYLENE GLYCOL 3350 17 G PO PACK: 17.0000 g | PACK | Freq: Every day | ORAL | Status: DC | PRN

## 2016-03-04 MED ORDER — ROCURONIUM BROMIDE 50 MG/5ML IV SOLN
INTRAVENOUS | Status: AC
  Filled 2016-03-04: qty 1

## 2016-03-04 MED ORDER — CEFAZOLIN SODIUM-DEXTROSE 2-4 GM/100ML-% IV SOLN
INTRAVENOUS | Status: AC
Start: 2016-03-04 — End: 2016-03-04
  Filled 2016-03-04: qty 100

## 2016-03-04 MED ORDER — SIMVASTATIN 40 MG PO TABS
40.0000 mg | ORAL_TABLET | Freq: Every day | ORAL | Status: DC
  Administered 2016-03-04: 40 mg via ORAL
  Filled 2016-03-04: qty 1

## 2016-03-04 MED ORDER — FENTANYL CITRATE (PF) 100 MCG/2ML IJ SOLN
INTRAMUSCULAR | Status: AC
  Filled 2016-03-04: qty 2

## 2016-03-04 MED ORDER — LACTATED RINGERS IV SOLN: INTRAVENOUS | Status: DC

## 2016-03-04 MED ORDER — LACTATED RINGERS IV SOLN
INTRAVENOUS | Status: DC | PRN
Start: 2016-03-04 — End: 2016-03-04
  Administered 2016-03-04 (×2): via INTRAVENOUS

## 2016-03-04 MED ORDER — OXYCODONE HCL 5 MG PO TABS
5.0000 mg | ORAL_TABLET | ORAL | Status: DC | PRN
  Administered 2016-03-04 – 2016-03-05 (×3): 10 mg via ORAL
  Filled 2016-03-04 (×3): qty 2

## 2016-03-04 MED ORDER — DIPHENHYDRAMINE HCL 12.5 MG/5ML PO ELIX: 12.5000 mg | ORAL_SOLUTION | ORAL | Status: DC | PRN

## 2016-03-04 MED ORDER — FENTANYL CITRATE (PF) 100 MCG/2ML IJ SOLN
INTRAMUSCULAR | Status: DC | PRN
  Administered 2016-03-04: 50 ug via INTRAVENOUS

## 2016-03-04 MED ORDER — PHENYLEPHRINE HCL 10 MG/ML IJ SOLN
10.0000 mg | INTRAVENOUS | Status: DC | PRN
  Administered 2016-03-04: 50 ug/min via INTRAVENOUS

## 2016-03-04 MED ORDER — EZETIMIBE 10 MG PO TABS
10.0000 mg | ORAL_TABLET | Freq: Every day | ORAL | Status: DC
  Administered 2016-03-04: 10 mg via ORAL
  Filled 2016-03-04: qty 1

## 2016-03-04 MED ORDER — ASPIRIN EC 81 MG PO TBEC
81.0000 mg | DELAYED_RELEASE_TABLET | Freq: Every day | ORAL | Status: DC
  Administered 2016-03-04 – 2016-03-05 (×2): 81 mg via ORAL
  Filled 2016-03-04 (×4): qty 1

## 2016-03-04 MED ORDER — CEFAZOLIN SODIUM-DEXTROSE 2-4 GM/100ML-% IV SOLN
2.0000 g | INTRAVENOUS | Status: AC
  Administered 2016-03-04: 2 g via INTRAVENOUS

## 2016-03-04 MED ORDER — BISACODYL 5 MG PO TBEC: 5.0000 mg | DELAYED_RELEASE_TABLET | Freq: Every day | ORAL | Status: DC | PRN

## 2016-03-04 MED ORDER — MIDAZOLAM HCL 2 MG/2ML IJ SOLN
INTRAMUSCULAR | Status: AC
  Filled 2016-03-04: qty 2

## 2016-03-04 MED ORDER — LEVOTHYROXINE SODIUM 25 MCG PO TABS
125.0000 ug | ORAL_TABLET | Freq: Every day | ORAL | Status: DC
  Administered 2016-03-05: 125 ug via ORAL
  Filled 2016-03-04: qty 1

## 2016-03-04 MED ORDER — METOCLOPRAMIDE HCL 5 MG/ML IJ SOLN
5.0000 mg | Freq: Three times a day (TID) | INTRAMUSCULAR | Status: DC | PRN
Start: 2016-03-04 — End: 2016-03-05

## 2016-03-04 MED ORDER — LIDOCAINE HCL (CARDIAC) 20 MG/ML IV SOLN
INTRAVENOUS | Status: DC | PRN
  Administered 2016-03-04: 80 mg via INTRAVENOUS

## 2016-03-04 MED ORDER — DIAZEPAM 5 MG PO TABS: 5.0000 mg | ORAL_TABLET | Freq: Four times a day (QID) | ORAL | Status: DC | PRN

## 2016-03-04 MED ORDER — ARTIFICIAL TEARS OP OINT
TOPICAL_OINTMENT | OPHTHALMIC | Status: DC | PRN
  Administered 2016-03-04: 1 via OPHTHALMIC

## 2016-03-04 MED ORDER — TRIAMTERENE-HCTZ 37.5-25 MG PO TABS
1.0000 | ORAL_TABLET | Freq: Every day | ORAL | Status: DC
  Administered 2016-03-05: 1 via ORAL
  Filled 2016-03-04 (×2): qty 1

## 2016-03-04 MED ORDER — DEXAMETHASONE SODIUM PHOSPHATE 10 MG/ML IJ SOLN
INTRAMUSCULAR | Status: DC | PRN
  Administered 2016-03-04: 5 mg via INTRAVENOUS

## 2016-03-04 MED ORDER — HYDROMORPHONE HCL 1 MG/ML IJ SOLN: 1.0000 mg | INTRAMUSCULAR | Status: DC | PRN

## 2016-03-04 MED ORDER — VECURONIUM BROMIDE 10 MG IV SOLR
INTRAVENOUS | Status: AC
  Filled 2016-03-04: qty 10

## 2016-03-04 MED ORDER — DOCUSATE SODIUM 100 MG PO CAPS
100.0000 mg | ORAL_CAPSULE | Freq: Two times a day (BID) | ORAL | Status: DC
  Administered 2016-03-04 – 2016-03-05 (×2): 100 mg via ORAL
  Filled 2016-03-04 (×2): qty 1

## 2016-03-04 MED ORDER — EPHEDRINE 5 MG/ML INJ
INTRAVENOUS | Status: AC
  Filled 2016-03-04: qty 10

## 2016-03-04 MED ORDER — FLEET ENEMA 7-19 GM/118ML RE ENEM: 1.0000 | ENEMA | Freq: Once | RECTAL | Status: DC | PRN

## 2016-03-04 MED ORDER — SODIUM CHLORIDE 0.9 % IJ SOLN
INTRAMUSCULAR | Status: AC
  Filled 2016-03-04: qty 10

## 2016-03-04 MED ORDER — ACETAMINOPHEN 325 MG PO TABS
650.0000 mg | ORAL_TABLET | Freq: Four times a day (QID) | ORAL | Status: DC | PRN
  Administered 2016-03-05: 650 mg via ORAL
  Filled 2016-03-04: qty 2

## 2016-03-04 MED ORDER — BUPIVACAINE-EPINEPHRINE (PF) 0.5% -1:200000 IJ SOLN
INTRAMUSCULAR | Status: DC | PRN
  Administered 2016-03-04: 25 mL via PERINEURAL

## 2016-03-04 MED ORDER — NITROGLYCERIN 0.4 MG SL SUBL: 0.4000 mg | SUBLINGUAL_TABLET | SUBLINGUAL | Status: DC | PRN

## 2016-03-04 MED ORDER — FENTANYL CITRATE (PF) 100 MCG/2ML IJ SOLN
50.0000 ug | Freq: Once | INTRAMUSCULAR | Status: AC
  Administered 2016-03-04: 50 ug via INTRAVENOUS

## 2016-03-04 MED ORDER — KETOROLAC TROMETHAMINE 15 MG/ML IJ SOLN
7.5000 mg | Freq: Four times a day (QID) | INTRAMUSCULAR | Status: DC
  Administered 2016-03-04: 7.5 mg via INTRAVENOUS
  Administered 2016-03-05: 14:00:00 via INTRAVENOUS
  Administered 2016-03-05: 7.5 mg via INTRAVENOUS
  Filled 2016-03-04 (×3): qty 1

## 2016-03-04 MED ORDER — PROPOFOL 10 MG/ML IV BOLUS
INTRAVENOUS | Status: DC | PRN
  Administered 2016-03-04: 150 mg via INTRAVENOUS

## 2016-03-04 MED ORDER — MENTHOL 3 MG MT LOZG
1.0000 | LOZENGE | OROMUCOSAL | Status: DC | PRN
  Administered 2016-03-04: 3 mg via ORAL
  Filled 2016-03-04: qty 9

## 2016-03-04 SURGICAL SUPPLY — 65 items
ADH SKN CLS APL DERMABOND .7 (GAUZE/BANDAGES/DRESSINGS) ×1
ADH SKN CLS LQ APL DERMABOND (GAUZE/BANDAGES/DRESSINGS) ×1
AID PSTN UNV HD RSTRNT DISP (MISCELLANEOUS) ×1
BLADE SAW SGTL 83.5X18.5 (BLADE) ×2 IMPLANT
CEMENT BONE DEPUY (Cement) ×2 IMPLANT
COVER SURGICAL LIGHT HANDLE (MISCELLANEOUS) ×2 IMPLANT
DERMABOND ADHESIVE PROPEN (GAUZE/BANDAGES/DRESSINGS) ×1
DERMABOND ADVANCED (GAUZE/BANDAGES/DRESSINGS) ×1
DERMABOND ADVANCED .7 DNX12 (GAUZE/BANDAGES/DRESSINGS) IMPLANT
DERMABOND ADVANCED .7 DNX6 (GAUZE/BANDAGES/DRESSINGS) ×1 IMPLANT
DRAPE ORTHO SPLIT 77X108 STRL (DRAPES) ×4
DRAPE SURG 17X11 SM STRL (DRAPES) ×2 IMPLANT
DRAPE SURG ORHT 6 SPLT 77X108 (DRAPES) ×2 IMPLANT
DRAPE U-SHAPE 47X51 STRL (DRAPES) ×2 IMPLANT
DRSG AQUACEL AG ADV 3.5X10 (GAUZE/BANDAGES/DRESSINGS) ×2 IMPLANT
DURAPREP 26ML APPLICATOR (WOUND CARE) ×2 IMPLANT
ELECT BLADE 4.0 EZ CLEAN MEGAD (MISCELLANEOUS) ×2
ELECT CAUTERY BLADE 6.4 (BLADE) ×2 IMPLANT
ELECT REM PT RETURN 9FT ADLT (ELECTROSURGICAL) ×2
ELECTRODE BLDE 4.0 EZ CLN MEGD (MISCELLANEOUS) ×1 IMPLANT
ELECTRODE REM PT RTRN 9FT ADLT (ELECTROSURGICAL) ×1 IMPLANT
FACESHIELD WRAPAROUND (MASK) ×6 IMPLANT
FACESHIELD WRAPAROUND OR TEAM (MASK) ×3 IMPLANT
GLENOID WITH CLEAT SM (Miscellaneous) ×1 IMPLANT
GLOVE BIO SURGEON STRL SZ7.5 (GLOVE) ×2 IMPLANT
GLOVE BIO SURGEON STRL SZ8 (GLOVE) ×2 IMPLANT
GLOVE EUDERMIC 7 POWDERFREE (GLOVE) ×2 IMPLANT
GLOVE SS BIOGEL STRL SZ 7.5 (GLOVE) ×1 IMPLANT
GLOVE SUPERSENSE BIOGEL SZ 7.5 (GLOVE) ×1
GOWN STRL REUS W/ TWL LRG LVL3 (GOWN DISPOSABLE) ×1 IMPLANT
GOWN STRL REUS W/ TWL XL LVL3 (GOWN DISPOSABLE) ×2 IMPLANT
GOWN STRL REUS W/TWL LRG LVL3 (GOWN DISPOSABLE) ×4
GOWN STRL REUS W/TWL XL LVL3 (GOWN DISPOSABLE) ×4
HEAD HUMERAL USP II 44/19 (Head) ×1 IMPLANT
KIT BASIN OR (CUSTOM PROCEDURE TRAY) ×2 IMPLANT
KIT ROOM TURNOVER OR (KITS) ×2 IMPLANT
MANIFOLD NEPTUNE II (INSTRUMENTS) ×2 IMPLANT
NDL SUT .5 MAYO 1.404X.05X (NEEDLE) ×1 IMPLANT
NEEDLE MAYO TAPER (NEEDLE) ×2
NS IRRIG 1000ML POUR BTL (IV SOLUTION) ×2 IMPLANT
PACK SHOULDER (CUSTOM PROCEDURE TRAY) ×2 IMPLANT
PAD ARMBOARD 7.5X6 YLW CONV (MISCELLANEOUS) ×4 IMPLANT
RESTRAINT HEAD UNIVERSAL NS (MISCELLANEOUS) ×2 IMPLANT
SET PIN UNIVERSAL REVERSE (SET/KITS/TRAYS/PACK) ×1 IMPLANT
SLING ARM FOAM STRAP LRG (SOFTGOODS) IMPLANT
SLING ARM XL FOAM STRAP (SOFTGOODS) ×2 IMPLANT
SMARTMIX MINI TOWER (MISCELLANEOUS) ×2
SPONGE LAP 18X18 X RAY DECT (DISPOSABLE) ×1 IMPLANT
SPONGE LAP 4X18 X RAY DECT (DISPOSABLE) ×2 IMPLANT
STEM HUMERAL UNIVERS 7MM (Stem) ×1 IMPLANT
SUCTION FRAZIER HANDLE 10FR (MISCELLANEOUS) ×1
SUCTION TUBE FRAZIER 10FR DISP (MISCELLANEOUS) ×1 IMPLANT
SUT FIBERWIRE #2 38 T-5 BLUE (SUTURE) ×8
SUT MNCRL AB 3-0 PS2 18 (SUTURE) ×2 IMPLANT
SUT MON AB 2-0 CT1 36 (SUTURE) ×2 IMPLANT
SUT VIC AB 1 CT1 27 (SUTURE) ×4
SUT VIC AB 1 CT1 27XBRD ANBCTR (SUTURE) ×2 IMPLANT
SUT VIC AB 3-0 SH 27 (SUTURE) ×4
SUT VIC AB 3-0 SH 27X BRD (SUTURE) IMPLANT
SUTURE FIBERWR #2 38 T-5 BLUE (SUTURE) ×4 IMPLANT
SYR CONTROL 10ML LL (SYRINGE) IMPLANT
TOWEL OR 17X24 6PK STRL BLUE (TOWEL DISPOSABLE) ×2 IMPLANT
TOWEL OR 17X26 10 PK STRL BLUE (TOWEL DISPOSABLE) ×2 IMPLANT
TOWER SMARTMIX MINI (MISCELLANEOUS) ×1 IMPLANT
WATER STERILE IRR 1000ML POUR (IV SOLUTION) ×1 IMPLANT

## 2016-03-04 NOTE — Anesthesia Procedure Notes (Addendum)
Anesthesia Regional Block:  Interscalene brachial plexus block  Pre-Anesthetic Checklist: ,, timeout performed, Correct Patient, Correct Site, Correct Laterality, Correct Procedure, Correct Position, site marked, Risks and benefits discussed,  Surgical consent,  Pre-op evaluation,  At surgeon's request and post-op pain management  Laterality: Left  Prep: chloraprep       Needles:  Injection technique: Single-shot  Needle Type: Stimiplex     Needle Length: 9cm 9 cm Needle Gauge: 21 and 21 G    Additional Needles:  Procedures: ultrasound guided (picture in chart) Interscalene brachial plexus block Narrative:  Injection made incrementally with aspirations every 5 mL.  Performed by: Personally  Anesthesiologist: JUDD, BENJAMIN  Additional Notes: Risks, benefits and alternative to block explained extensively.  Patient tolerated procedure well, without complications.   Procedure Name: Intubation Date/Time: 03/04/2016 1:13 PM Performed by: Judeth Cornfield T Pre-anesthesia Checklist: Patient identified, Emergency Drugs available, Suction available and Patient being monitored Patient Re-evaluated:Patient Re-evaluated prior to inductionOxygen Delivery Method: Circle System Utilized Preoxygenation: Pre-oxygenation with 100% oxygen Intubation Type: IV induction Ventilation: Mask ventilation without difficulty and Oral airway inserted - appropriate to patient size Laryngoscope Size: Mac and 3 Grade View: Grade I Tube type: Oral Tube size: 7.5 mm Number of attempts: 1 Airway Equipment and Method: Stylet and Oral airway Placement Confirmation: ETT inserted through vocal cords under direct vision,  positive ETCO2 and breath sounds checked- equal and bilateral Secured at: 22 cm Tube secured with: Tape Dental Injury: Teeth and Oropharynx as per pre-operative assessment

## 2016-03-04 NOTE — Anesthesia Preprocedure Evaluation (Signed)
Anesthesia Evaluation  Patient identified by MRN, date of birth, ID band Patient awake    Reviewed: Allergy & Precautions, H&P , NPO status , Patient's Chart, lab work & pertinent test results  History of Anesthesia Complications Negative for: history of anesthetic complications  Airway Mallampati: II  TM Distance: >3 FB Neck ROM: full    Dental no notable dental hx.    Pulmonary pneumonia,    Pulmonary exam normal breath sounds clear to auscultation       Cardiovascular hypertension, + Peripheral Vascular Disease  Normal cardiovascular exam Rhythm:regular Rate:Normal     Neuro/Psych  Headaches, Depression    GI/Hepatic Neg liver ROS, hiatal hernia, GERD  ,  Endo/Other  Hypothyroidism   Renal/GU negative Renal ROS     Musculoskeletal  (+) Arthritis ,   Abdominal   Peds  Hematology negative hematology ROS (+)   Anesthesia Other Findings   Reproductive/Obstetrics negative OB ROS                             Anesthesia Physical Anesthesia Plan  ASA: III  Anesthesia Plan: General and Regional   Post-op Pain Management: GA combined w/ Regional for post-op pain   Induction: Intravenous  Airway Management Planned: Oral ETT  Additional Equipment:   Intra-op Plan:   Post-operative Plan: Extubation in OR  Informed Consent: I have reviewed the patients History and Physical, chart, labs and discussed the procedure including the risks, benefits and alternatives for the proposed anesthesia with the patient or authorized representative who has indicated his/her understanding and acceptance.   Dental Advisory Given  Plan Discussed with: Anesthesiologist, CRNA and Surgeon  Anesthesia Plan Comments:         Anesthesia Quick Evaluation

## 2016-03-04 NOTE — Op Note (Signed)
03/04/2016  2:54 PM  PATIENT:   Jordan [name]  65 y.o. female  PRE-OPERATIVE DIAGNOSIS:  LEFT SHOULDER OSTEOARTHRITIS   POST-OPERATIVE DIAGNOSIS:  same  PROCEDURE:  L TSA  SURGEON:  Abhi Moccia, Metta Clines. M.D.  ASSISTANTS: Shuford pac   ANESTHESIA:   GET + ISB  EBL: 200  SPECIMEN:  none  Drains: none   PATIENT DISPOSITION:  PACU - hemodynamically stable.    PLAN OF CARE: Admit for overnight observation  Dictation# [id number]   Contact # [phone]

## 2016-03-04 NOTE — H&P (Signed)
Jordan [name]    Chief Complaint: LEFT SHOULDER OSTEOARTHRITIS  HPI: The patient is a 65 y.o. female with end stage left shoulder OA  Past Medical History  Diagnosis Date  . Hyperlipidemia     takes Zetia and Zocor daily  . Thyroid disease   . Nasal congestion   . Leg swelling   . Constipation   . Nausea   . Generalized headaches     due to allergies, sinus  . PONV (postoperative nausea and vomiting)     pt states she is very easy to sedate  . Hypertension     takes Maxzide and Metoprolol daily  . Pneumonia     walking about 6-47yrs ago  . History of bronchitis     last time about 6-9yrs ago  . Hx of seasonal allergies     takes OTC allergy med nightly  . History of migraine     last one about 15+yrs ago  . Dizziness     r/t side effects from meds  . Vertigo     takes Meclizine prn  . Joint pain   . Back pain     buldging disc  . GERD (gastroesophageal reflux disease)     takes Protonix as needed  . Hemorrhoids   . History of colon polyps   . Mass of colon   . History of UTI   . Hypothyroidism     takes Synthroid daily  . Depression     takes Wellbutrin daily  . Wears glasses   . S/P radiation therapy 03/11/2015 through 04/25/2015     Right breast 4500 cGy in 25 sessions, right breast boost 1600 cGy in 8 sessions   . Tuberculosis     as little girl    . History of hiatal hernia   . Cancer ([place])     colon  . Breast cancer ([place]) 10/17/2014    lower inner quadrant of the right breast  . Family history of adverse reaction to anesthesia     It is hard for my mother to wake up from anesthesia.  . Wears glasses   . Arthritis     oa left shoulder  . Blood clot of neck vein   . Anemia     Past Surgical History  Procedure Laterality Date  . Cesarean section  1968, 1970  . Knee surgery  1998    right - arthroscopic  . Abdominal hysterectomy    . Exploratory laparotomy    . Carpal tunnel  release    . Colonoscopy    . Esophagogastroduodenoscopy    . Partial colectomy  03/30/2012    Procedure: PARTIAL COLECTOMY;  Surgeon: [name], MD;  Location: [facility];  Service: General;  Laterality: N/A;  . Appendectomy    . Radioactive seed guided mastectomy with axillary sentinel lymph node biopsy Right 10/17/2014    Procedure: RADIOACTIVE SEED GUIDED PARTIAL MASTECTOMY WITH AXILLARY SENTINEL LYMPH NODE BIOPSY;  Surgeon: [name], MD;  Location: [facility];  Service: General;  Laterality: Right;  . Portacath placement    . Port-a-cath removal N/A 06/25/2015    Procedure: REMOVAL PORT-A-CATH;  Surgeon: [name], MD;  Location: [facility] OR;  Service: General;  Laterality: N/A;    Family History  Problem Relation Age of Onset  . Cancer Father 76    prostate  . Cancer Brother 22    colon  . Deep vein thrombosis Brother   . Cancer Maternal  Aunt 48    breast  . Cancer Maternal Grandmother 78    breast  . Cancer Paternal Grandmother     colon  . Pulmonary embolism Brother     long-distance truck driver (PE x 2)  . Cancer Brother 41    colon  . Diabetes Maternal Grandfather   . Cancer Maternal Aunt 75    unknown type  . Mental illness Sister     history of Depression  . Seizures Sister 8    s/p traumatic brain injury  . Cancer Maternal Uncle     prostate    Social History:  reports that she has never smoked. She has never used smokeless tobacco. She reports that she drinks alcohol. She reports that she uses illicit drugs (Marijuana).   Medications Prior to Admission  Medication Sig Dispense Refill  . aspirin 81 MG tablet Take 81 mg by mouth daily.    Marland Kitchen buPROPion (WELLBUTRIN XL) 150 MG 24 hr tablet TAKE 1 TABLET (150 MG TOTAL) BY MOUTH DAILY. 90 tablet 1  . ezetimibe (ZETIA) 10 MG tablet Take 1 tablet (10 mg total) by mouth daily. 30 tablet 5  . levothyroxine (SYNTHROID, LEVOTHROID) 125 MCG tablet Take 1 tablet (125 mcg total) by mouth daily. 30 tablet 5   . meloxicam (MOBIC) 15 MG tablet Take 1 tablet (15 mg total) by mouth daily. 30 tablet 5  . oxyCODONE-acetaminophen (PERCOCET/ROXICET) 5-325 MG tablet Take 1-2 tablets by mouth every 4 (four) hours as needed for severe pain. 60 tablet 0  . Prenatal Multivit-Min-Fe-FA (PRENATAL VITAMINS PO) Take 1 tablet by mouth daily.     . simvastatin (ZOCOR) 40 MG tablet Take 1 tablet (40 mg total) by mouth at bedtime. 30 tablet 5  . triamterene-hydrochlorothiazide (MAXZIDE-25) 37.5-25 MG tablet TAKE 1 TABLET DAILY 90 tablet 1  . diphenhydrAMINE (BENADRYL) 25 MG tablet Take 25 mg by mouth daily as needed for allergies.    . nitroGLYCERIN (NITROSTAT) 0.4 MG SL tablet Place 1 tablet (0.4 mg total) under the tongue every 5 (five) minutes as needed for chest pain. 30 tablet 1     Physical Exam: Left shoulder with painful and restricted motion as noted at recent office visits  Vitals  Temp:  [98.3 F (36.8 C)] 98.3 F (36.8 C) (07/20 1121) Pulse Rate:  [73-87] 86 (07/20 1213) Resp:  [13-18] 18 (07/20 1213) BP: (121-152)/(59-74) 149/59 mmHg (07/20 1213) SpO2:  [97 %-99 %] 98 % (07/20 1213) Weight:  [102.649 kg (226 lb 4.8 oz)] 102.649 kg (226 lb 4.8 oz) (07/20 1121)  Assessment/Plan  Impression: LEFT SHOULDER OSTEOARTHRITIS   Plan of Action: Procedure(s): LEFT TOTAL SHOULDER ARTHROPLASTY  Jinelle Butchko M Taequan Stockhausen 03/04/2016, 12:18 PM Contact # [phone]

## 2016-03-04 NOTE — Transfer of Care (Signed)
Immediate Anesthesia Transfer of Care Note  Patient: Jordan [name]  Procedure(s) Performed: Procedure(s): LEFT TOTAL SHOULDER ARTHROPLASTY (Left)  Patient Location: PACU  Anesthesia Type:GA combined with regional for post-op pain  Level of Consciousness: awake and patient cooperative, alert  Airway & Oxygen Therapy: Patient Spontanous Breathing and Patient connected to nasal cannula oxygen  Post-op Assessment: Report given to RN, Post -op Vital signs reviewed and stable and Patient moving all extremities  Post vital signs: Reviewed and stable  Last Vitals:  Filed Vitals:   03/04/16 1223 03/04/16 1228  BP: 149/68 145/64  Pulse: 85 82  Temp:    Resp: 21 20    Last Pain:  Filed Vitals:   03/04/16 1528  PainSc: 7          Complications: No apparent anesthesia complications

## 2016-03-04 NOTE — Anesthesia Postprocedure Evaluation (Signed)
Anesthesia Post Note  Patient: Jordan [name]  Procedure(s) Performed: Procedure(s) (LRB): LEFT TOTAL SHOULDER ARTHROPLASTY (Left)  Patient location during evaluation: PACU Anesthesia Type: General and Regional Level of consciousness: awake and alert Pain management: pain level controlled Vital Signs Assessment: post-procedure vital signs reviewed and stable Respiratory status: spontaneous breathing, nonlabored ventilation, respiratory function stable and patient connected to nasal cannula oxygen Cardiovascular status: blood pressure returned to baseline and stable Postop Assessment: no signs of nausea or vomiting Anesthetic complications: no    Last Vitals:  Filed Vitals:   03/04/16 1223 03/04/16 1228  BP: 149/68 145/64  Pulse: 85 82  Temp:    Resp: 21 20    Last Pain:  Filed Vitals:   03/04/16 1528  PainSc: 7                  Zenaida Deed

## 2016-03-05 ENCOUNTER — Encounter (HOSPITAL_COMMUNITY): Payer: Self-pay | Admitting: Orthopedic Surgery

## 2016-03-05 MED ORDER — OXYCODONE-ACETAMINOPHEN 5-325 MG PO TABS: 1.0000 | ORAL_TABLET | ORAL | Status: DC | PRN

## 2016-03-05 MED ORDER — DIAZEPAM 5 MG PO TABS: 2.5000 mg | ORAL_TABLET | Freq: Four times a day (QID) | ORAL | Status: DC | PRN

## 2016-03-05 MED ORDER — ONDANSETRON HCL 4 MG PO TABS: 4.0000 mg | ORAL_TABLET | Freq: Three times a day (TID) | ORAL | Status: DC | PRN

## 2016-03-05 NOTE — Evaluation (Signed)
Physical Therapy Evaluation Patient Details Name: Jordan [name] MRN: [id number] DOB: [birth year] Today's Date: 03/05/2016   History of Present Illness  Pt is a 64 y.o. with a diagnosis of L shoulder osteoathritis, who underwent L total shoulder arthroplasty.   pt with hx of HTN, Vertig, Depression, TB, and Breast CA.    Clinical Impression  Pt mobilizing well post surgery and appears to be near her baseline level of function.  Pt does endorse some balance difficulties at baseline, so would benefit from HHPT for home safety and balance.  No further acute PT needs at this time.  Will sign off.      Follow Up Recommendations Home health PT;Supervision - Intermittent    Equipment Recommendations  None recommended by PT    Recommendations for Other Services       Precautions / Restrictions Precautions Precautions: Fall;Shoulder Type of Shoulder Precautions: Passive Shoulder Interventions: Shoulder sling/immobilizer;For comfort;Off for dressing/bathing/exercises Required Braces or Orthoses: Sling Restrictions Weight Bearing Restrictions: Yes LUE Weight Bearing: Non weight bearing Other Position/Activity Restrictions: AROM elbow, wrost, and hand to tolerance; Pendulums; FF 0-90; Abduction 0-60; ER 0-30; NO AROM of shoulder       Mobility  Bed Mobility Overal bed mobility: Needs Assistance Bed Mobility: Supine to Sit;Sit to Supine     Supine to sit: Min assist Sit to supine: Modified independent (Device/Increase time)   General bed mobility comments: pt indicates she can come to sitting without A, however less painful when using PT A to bring trunk up to sitting.    Transfers Overall transfer level: Modified independent Equipment used: Straight cane             General transfer comment: pt is slow, but demonstrates good technique and no A needed.    Ambulation/Gait Ambulation/Gait assistance: Supervision Ambulation Distance (Feet): 60 Feet Assistive device: Straight  cane Gait Pattern/deviations: Step-through pattern     General Gait Details: pt declined ambulating out of room, but was able to ambulate laps around her room without A.  pt does indicate she normally holds her cane in her L hand, but had been practicing with it in her R hand since L shoulder began to hurt.    Stairs            Wheelchair Mobility    Modified Rankin (Stroke Patients Only)       Balance Overall balance assessment: Needs assistance Sitting-balance support: No upper extremity supported;Feet supported Sitting balance-Leahy Scale: Good     Standing balance support: No upper extremity supported;During functional activity Standing balance-Leahy Scale: Good Standing balance comment: pt able to stand and perform UE task without UE A and with good balance.                               Pertinent Vitals/Pain Pain Assessment: Faces Faces Pain Scale: Hurts little more Pain Location: L shoulder Pain Descriptors / Indicators: Grimacing;Guarding Pain Intervention(s): Monitored during session;Premedicated before session;Repositioned    Home Living Family/patient expects to be discharged to:: Private residence Living Arrangements: Spouse/significant other;Other relatives Available Help at Discharge: Family;Available 24 hours/day Type of Home: House Home Access: Stairs to enter Entrance Stairs-Rails: Right Entrance Stairs-Number of Steps: 1 Home Layout: One level Home Equipment: Cane - single point;Other (comment) (non slip mat) Additional Comments: Counter to the right of the toilet to help sit to stand. OT educated on tub/shower entry and discussed options for DME.  Prior Function Level of Independence: Independent with assistive device(s) (SP cane)               Hand Dominance   Dominant Hand: Left    Extremity/Trunk Assessment   Upper Extremity Assessment: Defer to OT evaluation           Lower Extremity Assessment: Overall WFL  for tasks assessed      Cervical / Trunk Assessment: Normal  Communication   Communication: No difficulties  Cognition Arousal/Alertness: Awake/alert Behavior During Therapy: WFL for tasks assessed/performed Overall Cognitive Status: Within Functional Limits for tasks assessed                      General Comments      Exercises        Assessment/Plan    PT Assessment All further PT needs can be met in the next venue of care  PT Diagnosis Difficulty walking   PT Problem List Decreased activity tolerance;Decreased balance;Decreased mobility;Decreased knowledge of use of DME  PT Treatment Interventions     PT Goals (Current goals can be found in the Care Plan section) Acute Rehab PT Goals Patient Stated Goal: To go home PT Goal Formulation: All assessment and education complete, DC therapy    Frequency     Barriers to discharge        Co-evaluation               End of Session   Activity Tolerance: Patient tolerated treatment well Patient left: in bed;with call bell/phone within reach;with family/visitor present Nurse Communication: Mobility status         Time: 1435-1451 PT Time Calculation (min) (ACUTE ONLY): 16 min   Charges:   PT Evaluation $PT Eval Low Complexity: 1 Procedure     PT G CodesCatarina Hartshorn, [place] 03/05/2016, 3:17 PM

## 2016-03-05 NOTE — Op Note (Signed)
NAME:  Jordan [name], Jordan [name]                   ACCOUNT NO.:  MEDICAL RECORD NO.:  [id number]  LOCATION:                                 FACILITY:  PHYSICIAN:  Metta Clines. Khori Underberg, M.D.  DATE OF BIRTH:  [birth year]  DATE OF PROCEDURE:  03/04/2016 DATE OF DISCHARGE:                              OPERATIVE REPORT   PREOPERATIVE DIAGNOSIS:  End-stage left shoulder osteoarthritis.  POSTOPERATIVE DIAGNOSIS:  End-stage left shoulder osteoarthritis.  PROCEDURE:  A left total shoulder arthroplasty utilizing a press-fit size 7 DePuy stem with a 44 x 19 humeral head and a cemented pegged small glenoid.  SURGEON:  Metta Clines. Quirino Kakos, M.D.  ASSISTANT:  Jenetta Loges, PA-C.  ANESTHESIA:  General endotracheal as well as an interscalene block.  ESTIMATED BLOOD LOSS:  200 mL.  DRAINS:  None.  HISTORY:  [name] is a 65 year old female, who has had chronic and progressive increasing left shoulder pain related to end-stage left shoulder osteoarthritis.  Her symptoms have been refractory to prolonged attempts at conservative management and due to her ongoing and increasing functional limitation, she is brought to the operating room at this time for planned left total shoulder arthroplasty.  Preoperatively, I counseled [name] regarding treatment options and potential risks versus benefits thereof.  The possible surgical complications were reviewed including bleeding, infection, neurovascular injury, persistent pain, loss of motion, anesthetic complication, failure of the implant, and possible need for additional surgery.  She understands and accepts and agrees with our planned procedure.  PROCEDURE IN DETAIL:  After undergoing routine preop evaluation, the patient received prophylactic antibiotics.  An interscalene block was established in the holding area by the Anesthesia Department.  Placed supine on the operating table.  Underwent smooth induction of general endotracheal anesthesia.  Placed  in a beach chair position and appropriately padded and protected.  Left shoulder girdle region was then sterilely prepped and draped in standard fashion.  Time-out was called.  In an anterior deltopectoral approach, the left shoulder was made through a 10-cm incision.  Skin flaps were elevated.  Dissection was carried deeply.  Deltopectoral interval was then developed from proximal distal with the vein taken laterally and there were a number of varicosities and significant tortuous nature of the vein which ultimately required [place] to perform a ligation of the vein proximally distally to gain proper exposure and hemostasis.  Number of collaterals were also identified.  The upper centimeter of the pec major was tenotomized to enhance our exposure.  The conjoined tendon was then mobilized and retracted medially.  Adhesions beneath the deltoid were divided and a Brown retractor was placed.  At this point, we unroofed the long-head biceps tendon, tenotomized this, and then split the rotator cuff along the rotator interval to the base of the coracoid and then separated the subscapularis away from its lesser tuberosity insertion leaving a 1 cm cuff of tissue for later repair and then tagging the free margin of the subscapularis with a series of 4 grasping #2 FiberWire sutures.  We coagulated the anterior humeral circumflex vessels.  I then completed division of the capsular tissues from anteroinferior and inferior aspects of the humeral neck, allowing  delivery of the head through the wound.  At this point, we outlined our proposed humeral head resection at 135 degrees and then at approximately 30 degrees of retroversion using oscillating saw to perform our humeral head resection.  At this time, we then prepared the humerus with hand reaming size 7 and broached up to size 7, maintaining native retroversion.  Overall fit was much to our satisfaction.  The trial size 6 with a metal cap was then  placed in the humeral canal and at this point, we then approached the glenoid exposure using Fukuda, pitchfork, and snake tongue retractors.  We performed a circumferential labral resection, gaining complete visualization of the circumference of the glenoid.  We selected the small size glenoid appropriate for the anatomy and placed a guidepin into the center of the glenoid, allowing [place] to correct a small amount of retroversion and we reamed the glenoid to a smooth subchondral bony bed, removed residual bony debris and soft tissue at the margin of the glenoid.  We placed our central drill hole and used the appropriate guide to create our superior and inferior drill holes and slots respectively.  We then broached the glenoid.  Trial glenoid showed excellent fit.  The glenoid was then meticulously cleaned and dried.  Cement was mixed and then introduced into the superior and inferior PEG and slots respectively.  We then impacted the small glenoid with excellent fit and fixation.  At this point, we then returned our attention to the proximal humerus where we impacted our size 7 stem with excellent fit and fixation and tightened the proximal locking screws. We then performed a series of trial reductions and ultimately felt that the 44 x 19 head provided excellent coverage and good soft tissue balance with 50% translation of the humeral head and glenoid.  The [facility] taper was then cleaned and dried.  The final 44 x 19 head was impacted. Final reduction showed again excellent soft tissue balance, good mobility, and good stability.  We then repaired the subscapularis back to the lesser tuberosity through the cuff of soft tissue using the FiberWire sutures, which allowed excellent reapposition.  The rotator interval was also closed with figure-of-eight FiberWire suture.  The wound was then irrigated.  Hemostasis was obtained.  The deltopectoral interval was then reapproximated with a series of  figure-of-eight #1 Vicryl sutures.  A 2-0 Monocryl was used for subcu layer, intracuticular 3-0 Monocryl for the skin, followed by Dermabond and Aquacel dressing. Left arm was placed in a sling.  The patient was then awakened, extubated, and taken to recovery room in stable condition.  Jenetta Loges, PA-C, was used as an Environmental consultant throughout this case and was essential for help with positioning the patient, positioning of the extremity, management of the retractors, tissue manipulation, implantation of prosthesis, wound closure, and intraoperative decision making.     Metta Clines. Nikoli Nasser, M.D.     KMS/MEDQ  D:  03/04/2016  T:  03/05/2016  Job:  [id number]

## 2016-03-05 NOTE — Discharge Summary (Signed)
PATIENT ID:      Jordan [name]  MRN:     [id number] DOB/AGE:    [birth year] / 65 y.o.     DISCHARGE SUMMARY  ADMISSION DATE:    03/04/2016 DISCHARGE DATE:    ADMISSION DIAGNOSIS: LEFT SHOULDER OSTEOARTHRITIS  Past Medical History  Diagnosis Date  . Hyperlipidemia     takes Zetia and Zocor daily  . Thyroid disease   . Nasal congestion   . Leg swelling   . Constipation   . Nausea   . Generalized headaches     due to allergies, sinus  . PONV (postoperative nausea and vomiting)     pt states she is very easy to sedate  . Hypertension     takes Maxzide and Metoprolol daily  . Pneumonia     walking about 6-90yrs ago  . History of bronchitis     last time about 6-19yrs ago  . Hx of seasonal allergies     takes OTC allergy med nightly  . History of migraine     last one about 15+yrs ago  . Dizziness     r/t side effects from meds  . Vertigo     takes Meclizine prn  . Joint pain   . Back pain     buldging disc  . GERD (gastroesophageal reflux disease)     takes Protonix as needed  . Hemorrhoids   . History of colon polyps   . Mass of colon   . History of UTI   . Hypothyroidism     takes Synthroid daily  . Depression     takes Wellbutrin daily  . Wears glasses   . S/P radiation therapy 03/11/2015 through 04/25/2015     Right breast 4500 cGy in 25 sessions, right breast boost 1600 cGy in 8 sessions   . Tuberculosis     as little girl    . History of hiatal hernia   . Cancer ([place])     colon  . Breast cancer ([place]) 10/17/2014    lower inner quadrant of the right breast  . Family history of adverse reaction to anesthesia     It is hard for my mother to wake up from anesthesia.  . Wears glasses   . Arthritis     oa left shoulder  . Blood clot of neck vein   . Anemia     DISCHARGE DIAGNOSIS:   Active Problems:   S/P shoulder replacement   PROCEDURE: Procedure(s): LEFT TOTAL SHOULDER  ARTHROPLASTY on 03/04/2016  CONSULTS:     HISTORY:  See H&P in chart.  HOSPITAL COURSE:  Jordan [name] is a 65 y.o. admitted on 03/04/2016 with a diagnosis of LEFT SHOULDER OSTEOARTHRITIS .  They were brought to the operating room on 03/04/2016 and underwent Procedure(s): LEFT TOTAL SHOULDER ARTHROPLASTY.    They were given perioperative antibiotics: Anti-infectives    Start     Dose/Rate Route Frequency Ordered Stop   03/04/16 1815  ceFAZolin (ANCEF) IVPB 2g/100 mL premix     2 g 200 mL/hr over 30 Minutes Intravenous Every 6 hours 03/04/16 1809 03/05/16 0645   03/04/16 1230  ceFAZolin (ANCEF) IVPB 2g/100 mL premix     2 g 200 mL/hr over 30 Minutes Intravenous On call to O.R. 03/04/16 1112 03/04/16 1300   03/04/16 1114  ceFAZolin (ANCEF) 2-4 GM/100ML-% IVPB    Comments:  Ara Kussmaul   : cabinet override      03/04/16 1114 03/04/16 2329    .  Patient underwent the above named procedure and tolerated it well. The following day they were hemodynamically stable and pain was controlled on oral analgesics. The block was still partially in effect on rounds.  They were neurovascularly intact to the operative extremity. OT was ordered and worked with patient per protocol. They were medically and orthopaedically stable for discharge on day1 .   DIAGNOSTIC STUDIES:  RECENT RADIOGRAPHIC STUDIES :  No results found.  RECENT VITAL SIGNS:  Patient Vitals for the past 24 hrs:  BP Temp Temp src Pulse Resp SpO2 Height Weight  03/05/16 0534 114/84 mmHg 98.2 F (36.8 C) Oral 83 18 94 % - -  03/04/16 2029 135/62 mmHg 98.2 F (36.8 C) Oral 99 18 96 % - -  03/04/16 1815 (!) 145/62 mmHg 98.2 F (36.8 C) Oral 86 16 97 % - -  03/04/16 1745 139/73 mmHg 97.8 F (36.6 C) - 85 15 100 % - -  03/04/16 1700 (!) 148/69 mmHg - - 81 15 100 % - -  03/04/16 1645 - - - 87 15 100 % - -  03/04/16 1637 134/75 mmHg - - - - - - -  03/04/16 1630 - - - 83 16 100 % - -  03/04/16 1623 124/77 mmHg - - - - - - -   03/04/16 1615 - - - 83 11 100 % - -  03/04/16 1608 126/70 mmHg - - - - - - -  03/04/16 1600 - - - 75 14 100 % - -  03/04/16 1553 134/74 mmHg - - - - - - -  03/04/16 1544 - - - 84 20 99 % - -  03/04/16 1538 138/79 mmHg - - - - - - -  03/04/16 1527 - 97.5 F (36.4 C) - 77 15 100 % - -  03/04/16 1523 137/77 mmHg - - - - - - -  03/04/16 1228 (!) 145/64 mmHg - - 82 20 98 % - -  03/04/16 1223 (!) 149/68 mmHg - - 85 (!) 21 98 % - -  03/04/16 1220 (!) 148/64 mmHg - - 92 (!) 21 98 % - -  03/04/16 1218 - - - 86 20 98 % - -  03/04/16 1213 (!) 149/59 mmHg - - 86 18 98 % - -  03/04/16 1210 (!) 152/62 mmHg - - 87 15 99 % - -  03/04/16 1203 (!) 142/72 mmHg - - 76 15 98 % - -  03/04/16 1200 (!) 141/74 mmHg - - 73 13 99 % - -  03/04/16 1121 121/70 mmHg 98.3 F (36.8 C) Oral 79 18 97 % 5' 1.5" (1.562 m) 102.649 kg (226 lb 4.8 oz)  .  RECENT EKG RESULTS:    Orders placed or performed during the hospital encounter of 03/02/16  . EKG 12 lead  . EKG 12 lead    DISCHARGE INSTRUCTIONS:  Discharge Instructions    Discontinue IV    Complete by:  As directed            DISCHARGE MEDICATIONS:     Medication List    TAKE these medications        aspirin 81 MG tablet  Take 81 mg by mouth daily.     buPROPion 150 MG 24 hr tablet  Commonly known as:  WELLBUTRIN XL  TAKE 1 TABLET (150 MG TOTAL) BY MOUTH DAILY.     diazepam 5 MG tablet  Commonly known as:  VALIUM  Take 0.5-1 tablets (2.5-5 mg  total) by mouth every 6 (six) hours as needed for muscle spasms or sedation.     diphenhydrAMINE 25 MG tablet  Commonly known as:  BENADRYL  Take 25 mg by mouth daily as needed for allergies.     ezetimibe 10 MG tablet  Commonly known as:  ZETIA  Take 1 tablet (10 mg total) by mouth daily.     levothyroxine 125 MCG tablet  Commonly known as:  SYNTHROID, LEVOTHROID  Take 1 tablet (125 mcg total) by mouth daily.     meloxicam 15 MG tablet  Commonly known as:  MOBIC  Take 1 tablet (15 mg total)  by mouth daily.     nitroGLYCERIN 0.4 MG SL tablet  Commonly known as:  NITROSTAT  Place 1 tablet (0.4 mg total) under the tongue every 5 (five) minutes as needed for chest pain.     ondansetron 4 MG tablet  Commonly known as:  ZOFRAN  Take 1 tablet (4 mg total) by mouth every 8 (eight) hours as needed for nausea or vomiting.     oxyCODONE-acetaminophen 5-325 MG tablet  Commonly known as:  PERCOCET  Take 1-2 tablets by mouth every 4 (four) hours as needed.     PRENATAL VITAMINS PO  Take 1 tablet by mouth daily.     simvastatin 40 MG tablet  Commonly known as:  ZOCOR  Take 1 tablet (40 mg total) by mouth at bedtime.     triamterene-hydrochlorothiazide 37.5-25 MG tablet  Commonly known as:  MAXZIDE-25  TAKE 1 TABLET DAILY        FOLLOW UP VISIT:       Follow-up Information    Follow up with Metta Clines SUPPLE, MD.   Specialty:  Orthopedic Surgery   Why:  call to be seen in 10-14 days   Contact information:   [address] [place] [address] [place] [address] [id number]       DISCHARGE TO: Home  DISPOSITION: Good  DISCHARGE CONDITION:  Festus Barren for Dr. [name] 03/05/2016, 8:22 AM

## 2016-03-05 NOTE — Care Management Important Message (Signed)
Important Message  Patient Details  Name: Jordan [name] MRN: [id number] Date of Birth: April 17, 1951   Medicare Important Message Given:  Yes    Loann Quill 03/05/2016, 8:27 AM

## 2016-03-05 NOTE — Care Management Note (Signed)
Case Management Note  Patient Details  Name: Jordan [name] MRN: [id number] Date of Birth: 05/05/1951  Subjective/Objective:  65 yr old female s/p left total shoulder arthroplasty.                  Action/Plan: Case manager spoke with patient concerning home health and DME needs. Referral called to Marcy Salvo, [place] Specialist. Patient will have family support at discharge.03/05/16   Expected Discharge Date:   03/05/16               Expected Discharge Plan:  [place]  In-House Referral:     Discharge planning Services  CM Consult  Post Acute Care Choice:  Durable Medical Equipment, Home Health Choice offered to:  Patient  DME Arranged:  3-N-1 DME Agency:  [place]:  OT, PT [facility] Agency:  [place]  Status of Service:     If discussed at [place] of Stay Meetings, dates discussed:    Additional Comments:  Ninfa Meeker, RN 03/05/2016, 4:17 PM

## 2016-03-05 NOTE — Progress Notes (Signed)
OT Evaluation  Began education with pt regarding protocol for TSA and ADL retraining. Pt with limited ROM L shoulder. Nerve block still active. Pt with history of falls. Recommend HHOT/PT for pt and 3 in1 to use as shower chair to facilitate return to PLOF and reduce risk of falls.  Will return after lunch prior to D/C to review information and complete education with pt/family to facilitate safe D/C home and adherence to L shoulder protocol.    03/05/16 0832  OT Visit Information  Last OT Received On 03/05/16  Assistance Needed +1  History of Present Illness Pt is a 65 y.o. with a diagnosis of L shoulder osteoathritis, who underwent L total shoulder arthroplasty.   Precautions  Precautions Fall;Shoulder  Type of Shoulder Precautions Passive  Shoulder Interventions Shoulder sling/immobilizer;For comfort;Off for dressing/bathing/exercises (On for walking and sleeping)  Precaution Booklet Issued Yes (comment)  Precaution Comments Pt given Shoulder Protocol Handout, as well as handout for each exercise  Required Braces or Orthoses Sling  Restrictions  Weight Bearing Restrictions Yes  LUE Weight Bearing NWB  Other Position/Activity Restrictions AROM elbow, wrost, and hand to tolerance; Pendulums; FF 0-90; Abduction 0-60; ER 0-30; NO AROM of shoulder   Home Living  Family/patient expects to be discharged to: Private residence  Living Arrangements Spouse/significant other;Other relatives  Available Help at Discharge Family;Available 24 hours/day  Type of Home House  Home Access Stairs to enter  Entrance Stairs-Number of Steps 1  Entrance Stairs-Rails Right  Home Layout One level  Bathroom Shower/Tub Tub/shower unit;Curtain  Shower/tub characteristics Curtain  Corporate treasurer Yes  How Accessible Accessible via walker  [place] - single point;Other (comment) (non slip mat)  Additional Comments Counter to the right of the toilet to help sit to  stand. OT educated on tub/shower entry and discussed options for DME.   Prior Function  Level of Independence Independent with assistive device(s) (SP cane)  Communication  Communication No difficulties  Pain Assessment  Pain Assessment Faces  Faces Pain Scale 6  Pain Descriptors / Indicators Grimacing;Tightness;Discomfort  Pain Intervention(s) Monitored during session;Premedicated before session;Ice applied;Repositioned  Cognition  Arousal/Alertness Awake/alert  Behavior During Therapy WFL for tasks assessed/performed  Overall Cognitive Status Within Functional Limits for tasks assessed  Upper Extremity Assessment  Upper Extremity Assessment LUE deficits/detail  LUE Deficits / Details limited due to surgery  LUE Unable to fully assess due to immobilization;Unable to fully assess due to pain  LUE Sensation decreased light touch;decreased proprioception (due to nerve block)  LUE Coordination decreased fine motor;decreased gross motor (due to nerve block)  Lower Extremity Assessment  Lower Extremity Assessment Defer to PT evaluation  Cervical / Trunk Assessment  Cervical / Trunk Assessment Normal  ADL  Overall ADL's  Needs assistance/impaired  Grooming Moderate assistance  Upper Body Bathing Moderate assistance  Lower Body Bathing Minimal assistance;Sit to/from stand  Upper Body Dressing  Moderate assistance  Lower Body Dressing Moderate assistance;Sit to/from Environmental education officer guard  Toileting- Water quality scientist and Hygiene Moderate assistance  Tub/ Chemical engineer Details (indicate cue type and reason) Discussed tub shower transfer with Pt, and need for DME. OT reinforced needed to sit during bathing due to balance issues.  Functional mobility during ADLs Min guard (Hand held assist (cane at baseline))  General ADL Comments Educated pt on proper positioning of LUE in sitting nadf sleeping. Recommended sleeping in recliner   Vision- History  Baseline Vision/History Wears glasses  Patient Visual Report No change from baseline  Bed Mobility  General bed mobility comments In chair upon entering the room  Transfers  Overall transfer level Needs assistance (slow to rise)  Equipment used 1 person hand held assist  Transfers Sit to/from Stand  Sit to Stand Min guard  Balance  Overall balance assessment Needs assistance;History of Falls  Sitting-balance support Feet supported;Single extremity supported  Sitting balance-Leahy Scale Good  Standing balance support Single extremity supported;During functional activity  Standing balance-Leahy Scale Fair  Standing balance comment Pt reports that she's had falls at home x2. Once because she stepped on something, the other because she lost her balance. Pt uses single point cane at baseline.  Exercises  Exercises Shoulder  Shoulder Instructions  Donning/doffing shirt without moving shoulder Moderate assistance  Method for sponge bathing under operated UE Moderate assistance  Donning/doffing sling/immobilizer Moderate assistance  Correct positioning of sling/immobilizer Moderate assistance  Pendulum exercises (written home exercise program) Min-guard  ROM for elbow, wrist and digits of operated UE Moderate assistance  Sling wearing schedule (on at all times/off for ADL's) Minimal assistance  Proper positioning of operated UE when showering Minimal assistance  Positioning of UE while sleeping Minimal assistance  Shoulder Exercises  Pendulum Exercise Left;10 reps;Standing;Other (comment) (leaning on higher counter (e.g kitchen counter))  Shoulder Flexion PROM;10 reps (0-90)  Shoulder ABduction PROM;10 reps;Other (comment) (0-60)  Shoulder External Rotation PROM;10 reps;Other (comment) (0-30)  Elbow Flexion AROM;10 reps  Elbow Extension AROM;10 reps  Wrist Flexion AROM;10 reps;Seated  Digit Composite Flexion AROM;10 reps  Composite Extension AROM;10 reps  OT -  End of Session  Activity Tolerance Patient tolerated treatment well  Patient left in chair;with call bell/phone within reach  Nurse Communication Mobility status  OT Assessment  OT Therapy Diagnosis  Generalized weakness;Acute pain  OT Recommendation/Assessment Patient needs continued OT Services  OT Problem List Decreased range of motion;Decreased activity tolerance;Impaired balance (sitting and/or standing);Decreased knowledge of use of DME or AE;Decreased knowledge of precautions;Impaired UE functional use;Pain  OT Plan  OT Frequency (ACUTE ONLY) Min 2X/week  OT Treatment/Interventions (ACUTE ONLY) Self-care/ADL training;Therapeutic exercise;DME and/or AE instruction;Patient/family education  OT Recommendation  Recommendations for Other Services PT consult  Follow Up Recommendations Home health OT;Supervision - Intermittent  OT Equipment 3 in 1 bedside comode  Individuals Consulted  Consulted and Agree with Results and Recommendations Patient  Acute Rehab OT Goals  Patient Stated Goal To go home  OT Goal Formulation With patient  Time For Goal Achievement 03/12/16  Potential to Achieve Goals Good  OT Time Calculation  OT Start Time (ACUTE ONLY) 1010  OT Stop Time (ACUTE ONLY) 1057  OT Time Calculation (min) 47 min  OT General Charges  $OT Visit 1 Procedure  OT Evaluation  $OT Eval Moderate Complexity 1 Procedure  OT Treatments  $Self Care/Home Management  8-22 mins  $Therapeutic Exercise 8-22 mins  Written Expression  Dominant Hand Left  [facility], OTR/L  [phone] 03/05/2016

## 2016-03-05 NOTE — Discharge Instructions (Signed)

## 2016-03-05 NOTE — Progress Notes (Signed)
Occupational Therapy Treatment Patient Details Name: Jordan [name] MRN: [id number] DOB: 02-06-1951 Today's Date: 03/05/2016    History of present illness Pt is a 65 y.o. with a diagnosis of L shoulder osteoathritis, who underwent L total shoulder arthroplasty.   pt with hx of HTN, Vertig, Depression, TB, and Breast CA.     OT comments  Pt able to demonstrate exercises, maintaining precautions, and OT educated daughter/grand-daughter and sister in exercises so they can assist. Pt also provided with education for ADL safety. Pt would continue to benefit from OT services while in acute care, and with follow up OT at home to reinforce safety and shoulder precautions.   Follow Up Recommendations  Home health OT;Supervision - Intermittent    Equipment Recommendations  3 in 1 bedside comode    Recommendations for Other Services      Precautions / Restrictions Precautions Precautions: Fall;Shoulder Type of Shoulder Precautions: Passive Shoulder Interventions: Shoulder sling/immobilizer;For comfort;Off for dressing/bathing/exercises Required Braces or Orthoses: Sling Restrictions Weight Bearing Restrictions: Yes LUE Weight Bearing: Non weight bearing Other Position/Activity Restrictions: AROM elbow, wrost, and hand to tolerance; Pendulums; FF 0-90; Abduction 0-60; ER 0-30; NO AROM of shoulder        Mobility Bed Mobility Overal bed mobility: Needs Assistance Bed Mobility: Supine to Sit;Sit to Supine     Supine to sit: Min assist Sit to supine: Modified independent (Device/Increase time)   General bed mobility comments: Used OT hand for assistance to go from sit to stand  Transfers Overall transfer level: Needs assistance Equipment used: Straight cane Transfers: Sit to/from Stand Sit to Stand: Min assist         General transfer comment: pt is slow, but demonstrates good technique and no A needed.      Balance Overall balance assessment: Needs assistance Sitting-balance  support: No upper extremity supported;Feet supported Sitting balance-Leahy Scale: Good     Standing balance support: Single extremity supported Standing balance-Leahy Scale: Good Standing balance comment: Pt reports that she's had falls at home x2. Once because she stepped on something, the other because she lost her balance. Pt uses single point cane at baseline.                   ADL Overall ADL's : Needs assistance/impaired     Grooming: Moderate assistance   Upper Body Bathing: Moderate assistance   Lower Body Bathing: Minimal assistance;Sit to/from stand   Upper Body Dressing : Moderate assistance   Lower Body Dressing: Moderate assistance;Sit to/from stand   Toilet Transfer: Designer, fashion/clothing and Hygiene: Moderate assistance   Tub/ Shower Transfer: Minimal Museum/gallery conservator Details (indicate cue type and reason): Discussed tub shower transfer with Pt, and need for DME. OT reinforced needed to sit during bathing due to balance issues. Functional mobility during ADLs: Min guard General ADL Comments: Educated pt on proper positioning of LUE in sitting and sleeping. Recommended sleeping in recliner      Vision                     Perception     Praxis      Cognition   Behavior During Therapy: [facility] for tasks assessed/performed Overall Cognitive Status: Within Functional Limits for tasks assessed                       Extremity/Trunk Assessment  Upper Extremity Assessment Upper Extremity Assessment: Defer to OT evaluation  Lower Extremity Assessment Lower Extremity Assessment: Overall WFL for tasks assessed   Cervical / Trunk Assessment Cervical / Trunk Assessment: Normal    Exercises Shoulder Exercises Pendulum Exercise: Left;10 reps;Standing;Other (comment) Shoulder Flexion: PROM;10 reps Shoulder ABduction: PROM;10 reps;Other (comment) Shoulder External Rotation: PROM;10 reps;Other  (comment) Elbow Flexion: AROM;10 reps Elbow Extension: AROM;10 reps Wrist Flexion: AROM;10 reps;Seated Digit Composite Flexion: AROM;10 reps Composite Extension: AROM;10 reps Donning/doffing shirt without moving shoulder: Moderate assistance Method for sponge bathing under operated UE: Min-guard Donning/doffing sling/immobilizer: Maximal assistance Correct positioning of sling/immobilizer: Moderate assistance Pendulum exercises (written home exercise program): Min-guard ROM for elbow, wrist and digits of operated UE: Moderate assistance Sling wearing schedule (on at all times/off for ADL's): Minimal assistance Proper positioning of operated UE when showering: Minimal assistance Positioning of UE while sleeping: Minimal assistance   Shoulder Instructions Shoulder Instructions Donning/doffing shirt without moving shoulder: Moderate assistance Method for sponge bathing under operated UE: Min-guard Donning/doffing sling/immobilizer: Maximal assistance Correct positioning of sling/immobilizer: Moderate assistance Pendulum exercises (written home exercise program): Min-guard ROM for elbow, wrist and digits of operated UE: Moderate assistance Sling wearing schedule (on at all times/off for ADL's): Minimal assistance Proper positioning of operated UE when showering: Minimal assistance Positioning of UE while sleeping: Minimal assistance     General Comments      Pertinent Vitals/ Pain       Pain Assessment: Faces Faces Pain Scale: Hurts little more Pain Location: L shoulder Pain Descriptors / Indicators: Grimacing;Guarding Pain Intervention(s): Monitored during session;Premedicated before session;Repositioned  Home Living Family/patient expects to be discharged to:: Private residence Living Arrangements: Spouse/significant other;Other relatives Available Help at Discharge: Family;Available 24 hours/day Type of Home: House Home Access: Stairs to enter [employer] of  Steps: 1 Entrance Stairs-Rails: Right Home Layout: One level     Bathroom Shower/Tub: Tub/shower unit;Curtain   Bathroom Toilet: Standard Bathroom Accessibility: Yes   Home Equipment: Cane - single point;Other (comment) (non slip mat)   Additional Comments: Counter to the right of the toilet to help sit to stand. OT educated on tub/shower entry and discussed options for DME.       Prior Functioning/Environment Level of Independence: Independent with assistive device(s) (SP cane)            Frequency Min 2X/week     Progress Toward Goals  OT Goals(current goals can now be found in the care plan section)  Progress towards OT goals: Progressing toward goals  Acute Rehab OT Goals Patient Stated Goal: To go home OT Goal Formulation: With patient Time For Goal Achievement: 03/12/16 Potential to Achieve Goals: Good ADL Goals Pt/caregiver will Perform Home Exercise Program: Left upper extremity;With written HEP provided;With Supervision Additional ADL Goal #1: Pt/family will verbalize understanidng of compensatory techniques for managemtn of LUE during ADL Additional ADL Goal #2: Pt/family will demosntrate proper positioning of LUE in sitting/supine  Plan Discharge plan remains appropriate    Co-evaluation                 End of Session     Activity Tolerance Patient tolerated treatment well   Patient Left in bed;with call bell/phone within reach;with family/visitor present   Nurse Communication Mobility status        Time: [id number] OT Time Calculation (min): 24 min  Charges: OT General Charges $OT Visit: 1 Procedure OT Treatments $Self Care/Home Management : 8-22 mins $Therapeutic Exercise: 8-22 mins  Merri Ray Horacio Werth OTR/L 03/05/2016, 4:10 PM [phone]

## 2016-03-06 DIAGNOSIS — D649 Anemia, unspecified: Secondary | ICD-10-CM | POA: Diagnosis not present

## 2016-03-06 DIAGNOSIS — I10 Essential (primary) hypertension: Secondary | ICD-10-CM | POA: Diagnosis not present

## 2016-03-06 DIAGNOSIS — E785 Hyperlipidemia, unspecified: Secondary | ICD-10-CM | POA: Diagnosis not present

## 2016-03-06 DIAGNOSIS — Z85038 Personal history of other malignant neoplasm of large intestine: Secondary | ICD-10-CM | POA: Diagnosis not present

## 2016-03-06 DIAGNOSIS — Z471 Aftercare following joint replacement surgery: Secondary | ICD-10-CM | POA: Diagnosis not present

## 2016-03-06 DIAGNOSIS — F329 Major depressive disorder, single episode, unspecified: Secondary | ICD-10-CM | POA: Diagnosis not present

## 2016-03-06 DIAGNOSIS — Z96612 Presence of left artificial shoulder joint: Secondary | ICD-10-CM | POA: Diagnosis not present

## 2016-03-06 DIAGNOSIS — Z853 Personal history of malignant neoplasm of breast: Secondary | ICD-10-CM | POA: Diagnosis not present

## 2016-03-06 DIAGNOSIS — K219 Gastro-esophageal reflux disease without esophagitis: Secondary | ICD-10-CM | POA: Diagnosis not present

## 2016-03-08 DIAGNOSIS — D649 Anemia, unspecified: Secondary | ICD-10-CM | POA: Diagnosis not present

## 2016-03-08 DIAGNOSIS — K219 Gastro-esophageal reflux disease without esophagitis: Secondary | ICD-10-CM | POA: Diagnosis not present

## 2016-03-08 DIAGNOSIS — Z471 Aftercare following joint replacement surgery: Secondary | ICD-10-CM | POA: Diagnosis not present

## 2016-03-08 DIAGNOSIS — Z96612 Presence of left artificial shoulder joint: Secondary | ICD-10-CM | POA: Diagnosis not present

## 2016-03-08 DIAGNOSIS — F329 Major depressive disorder, single episode, unspecified: Secondary | ICD-10-CM | POA: Diagnosis not present

## 2016-03-08 DIAGNOSIS — I10 Essential (primary) hypertension: Secondary | ICD-10-CM | POA: Diagnosis not present

## 2016-03-10 DIAGNOSIS — I10 Essential (primary) hypertension: Secondary | ICD-10-CM | POA: Diagnosis not present

## 2016-03-10 DIAGNOSIS — Z471 Aftercare following joint replacement surgery: Secondary | ICD-10-CM | POA: Diagnosis not present

## 2016-03-10 DIAGNOSIS — F329 Major depressive disorder, single episode, unspecified: Secondary | ICD-10-CM | POA: Diagnosis not present

## 2016-03-10 DIAGNOSIS — D649 Anemia, unspecified: Secondary | ICD-10-CM | POA: Diagnosis not present

## 2016-03-10 DIAGNOSIS — Z96612 Presence of left artificial shoulder joint: Secondary | ICD-10-CM | POA: Diagnosis not present

## 2016-03-10 DIAGNOSIS — K219 Gastro-esophageal reflux disease without esophagitis: Secondary | ICD-10-CM | POA: Diagnosis not present

## 2016-03-12 DIAGNOSIS — Z471 Aftercare following joint replacement surgery: Secondary | ICD-10-CM | POA: Diagnosis not present

## 2016-03-12 DIAGNOSIS — K219 Gastro-esophageal reflux disease without esophagitis: Secondary | ICD-10-CM | POA: Diagnosis not present

## 2016-03-12 DIAGNOSIS — Z96612 Presence of left artificial shoulder joint: Secondary | ICD-10-CM | POA: Diagnosis not present

## 2016-03-12 DIAGNOSIS — I10 Essential (primary) hypertension: Secondary | ICD-10-CM | POA: Diagnosis not present

## 2016-03-12 DIAGNOSIS — D649 Anemia, unspecified: Secondary | ICD-10-CM | POA: Diagnosis not present

## 2016-03-12 DIAGNOSIS — F329 Major depressive disorder, single episode, unspecified: Secondary | ICD-10-CM | POA: Diagnosis not present

## 2016-03-15 DIAGNOSIS — F329 Major depressive disorder, single episode, unspecified: Secondary | ICD-10-CM | POA: Diagnosis not present

## 2016-03-15 DIAGNOSIS — I10 Essential (primary) hypertension: Secondary | ICD-10-CM | POA: Diagnosis not present

## 2016-03-15 DIAGNOSIS — Z471 Aftercare following joint replacement surgery: Secondary | ICD-10-CM | POA: Diagnosis not present

## 2016-03-15 DIAGNOSIS — K219 Gastro-esophageal reflux disease without esophagitis: Secondary | ICD-10-CM | POA: Diagnosis not present

## 2016-03-15 DIAGNOSIS — Z96612 Presence of left artificial shoulder joint: Secondary | ICD-10-CM | POA: Diagnosis not present

## 2016-03-15 DIAGNOSIS — D649 Anemia, unspecified: Secondary | ICD-10-CM | POA: Diagnosis not present

## 2016-03-17 DIAGNOSIS — Z471 Aftercare following joint replacement surgery: Secondary | ICD-10-CM | POA: Diagnosis not present

## 2016-03-17 DIAGNOSIS — D649 Anemia, unspecified: Secondary | ICD-10-CM | POA: Diagnosis not present

## 2016-03-17 DIAGNOSIS — K219 Gastro-esophageal reflux disease without esophagitis: Secondary | ICD-10-CM | POA: Diagnosis not present

## 2016-03-17 DIAGNOSIS — F329 Major depressive disorder, single episode, unspecified: Secondary | ICD-10-CM | POA: Diagnosis not present

## 2016-03-17 DIAGNOSIS — I10 Essential (primary) hypertension: Secondary | ICD-10-CM | POA: Diagnosis not present

## 2016-03-17 DIAGNOSIS — Z96612 Presence of left artificial shoulder joint: Secondary | ICD-10-CM | POA: Diagnosis not present

## 2016-03-18 DIAGNOSIS — D649 Anemia, unspecified: Secondary | ICD-10-CM | POA: Diagnosis not present

## 2016-03-18 DIAGNOSIS — Z471 Aftercare following joint replacement surgery: Secondary | ICD-10-CM | POA: Diagnosis not present

## 2016-03-18 DIAGNOSIS — F329 Major depressive disorder, single episode, unspecified: Secondary | ICD-10-CM | POA: Diagnosis not present

## 2016-03-18 DIAGNOSIS — I10 Essential (primary) hypertension: Secondary | ICD-10-CM | POA: Diagnosis not present

## 2016-03-18 DIAGNOSIS — Z96612 Presence of left artificial shoulder joint: Secondary | ICD-10-CM | POA: Diagnosis not present

## 2016-03-18 DIAGNOSIS — K219 Gastro-esophageal reflux disease without esophagitis: Secondary | ICD-10-CM | POA: Diagnosis not present

## 2016-03-19 DIAGNOSIS — F329 Major depressive disorder, single episode, unspecified: Secondary | ICD-10-CM | POA: Diagnosis not present

## 2016-03-19 DIAGNOSIS — D649 Anemia, unspecified: Secondary | ICD-10-CM | POA: Diagnosis not present

## 2016-03-19 DIAGNOSIS — K219 Gastro-esophageal reflux disease without esophagitis: Secondary | ICD-10-CM | POA: Diagnosis not present

## 2016-03-19 DIAGNOSIS — Z96612 Presence of left artificial shoulder joint: Secondary | ICD-10-CM | POA: Diagnosis not present

## 2016-03-19 DIAGNOSIS — Z471 Aftercare following joint replacement surgery: Secondary | ICD-10-CM | POA: Diagnosis not present

## 2016-03-19 DIAGNOSIS — I10 Essential (primary) hypertension: Secondary | ICD-10-CM | POA: Diagnosis not present

## 2016-03-22 DIAGNOSIS — K219 Gastro-esophageal reflux disease without esophagitis: Secondary | ICD-10-CM | POA: Diagnosis not present

## 2016-03-22 DIAGNOSIS — I10 Essential (primary) hypertension: Secondary | ICD-10-CM | POA: Diagnosis not present

## 2016-03-22 DIAGNOSIS — D649 Anemia, unspecified: Secondary | ICD-10-CM | POA: Diagnosis not present

## 2016-03-22 DIAGNOSIS — Z471 Aftercare following joint replacement surgery: Secondary | ICD-10-CM | POA: Diagnosis not present

## 2016-03-22 DIAGNOSIS — F329 Major depressive disorder, single episode, unspecified: Secondary | ICD-10-CM | POA: Diagnosis not present

## 2016-03-22 DIAGNOSIS — Z96612 Presence of left artificial shoulder joint: Secondary | ICD-10-CM | POA: Diagnosis not present

## 2016-03-23 ENCOUNTER — Ambulatory Visit: Payer: Medicare Other | Admitting: Physician Assistant

## 2016-03-24 DIAGNOSIS — D649 Anemia, unspecified: Secondary | ICD-10-CM | POA: Diagnosis not present

## 2016-03-24 DIAGNOSIS — I10 Essential (primary) hypertension: Secondary | ICD-10-CM | POA: Diagnosis not present

## 2016-03-24 DIAGNOSIS — Z471 Aftercare following joint replacement surgery: Secondary | ICD-10-CM | POA: Diagnosis not present

## 2016-03-24 DIAGNOSIS — F329 Major depressive disorder, single episode, unspecified: Secondary | ICD-10-CM | POA: Diagnosis not present

## 2016-03-24 DIAGNOSIS — Z96612 Presence of left artificial shoulder joint: Secondary | ICD-10-CM | POA: Diagnosis not present

## 2016-03-24 DIAGNOSIS — K219 Gastro-esophageal reflux disease without esophagitis: Secondary | ICD-10-CM | POA: Diagnosis not present

## 2016-03-26 DIAGNOSIS — Z471 Aftercare following joint replacement surgery: Secondary | ICD-10-CM | POA: Diagnosis not present

## 2016-03-26 DIAGNOSIS — I10 Essential (primary) hypertension: Secondary | ICD-10-CM | POA: Diagnosis not present

## 2016-03-26 DIAGNOSIS — K219 Gastro-esophageal reflux disease without esophagitis: Secondary | ICD-10-CM | POA: Diagnosis not present

## 2016-03-26 DIAGNOSIS — F329 Major depressive disorder, single episode, unspecified: Secondary | ICD-10-CM | POA: Diagnosis not present

## 2016-03-26 DIAGNOSIS — D649 Anemia, unspecified: Secondary | ICD-10-CM | POA: Diagnosis not present

## 2016-03-26 DIAGNOSIS — Z96612 Presence of left artificial shoulder joint: Secondary | ICD-10-CM | POA: Diagnosis not present

## 2016-03-29 DIAGNOSIS — K219 Gastro-esophageal reflux disease without esophagitis: Secondary | ICD-10-CM | POA: Diagnosis not present

## 2016-03-29 DIAGNOSIS — Z471 Aftercare following joint replacement surgery: Secondary | ICD-10-CM | POA: Diagnosis not present

## 2016-03-29 DIAGNOSIS — F329 Major depressive disorder, single episode, unspecified: Secondary | ICD-10-CM | POA: Diagnosis not present

## 2016-03-29 DIAGNOSIS — I10 Essential (primary) hypertension: Secondary | ICD-10-CM | POA: Diagnosis not present

## 2016-03-29 DIAGNOSIS — D649 Anemia, unspecified: Secondary | ICD-10-CM | POA: Diagnosis not present

## 2016-03-29 DIAGNOSIS — Z96612 Presence of left artificial shoulder joint: Secondary | ICD-10-CM | POA: Diagnosis not present

## 2016-03-30 ENCOUNTER — Ambulatory Visit (INDEPENDENT_AMBULATORY_CARE_PROVIDER_SITE_OTHER): Payer: Medicare Other | Admitting: Physician Assistant

## 2016-03-30 DIAGNOSIS — E559 Vitamin D deficiency, unspecified: Secondary | ICD-10-CM | POA: Diagnosis not present

## 2016-03-30 DIAGNOSIS — Z471 Aftercare following joint replacement surgery: Secondary | ICD-10-CM | POA: Diagnosis not present

## 2016-03-30 DIAGNOSIS — R739 Hyperglycemia, unspecified: Secondary | ICD-10-CM

## 2016-03-30 DIAGNOSIS — E039 Hypothyroidism, unspecified: Secondary | ICD-10-CM

## 2016-03-30 DIAGNOSIS — D649 Anemia, unspecified: Secondary | ICD-10-CM | POA: Diagnosis not present

## 2016-03-30 DIAGNOSIS — F329 Major depressive disorder, single episode, unspecified: Secondary | ICD-10-CM | POA: Diagnosis not present

## 2016-03-30 DIAGNOSIS — E785 Hyperlipidemia, unspecified: Secondary | ICD-10-CM

## 2016-03-30 DIAGNOSIS — I10 Essential (primary) hypertension: Secondary | ICD-10-CM

## 2016-03-30 DIAGNOSIS — K219 Gastro-esophageal reflux disease without esophagitis: Secondary | ICD-10-CM | POA: Diagnosis not present

## 2016-03-30 DIAGNOSIS — Z96612 Presence of left artificial shoulder joint: Secondary | ICD-10-CM | POA: Diagnosis not present

## 2016-03-30 DIAGNOSIS — F32A Depression, unspecified: Secondary | ICD-10-CM

## 2016-03-30 NOTE — Patient Instructions (Addendum)
We will check your fasting labs in 3 months. Keep your sense of humor. Call 911 if you feel unsafe. Please make a plan for where you will go and how you will get there if you are injured again.    IF you received an x-ray today, you will receive an invoice from [facility]. Please contact [facility] at [phone] with questions or concerns regarding your invoice.   IF you received labwork today, you will receive an invoice from [employer]. Please contact [facility] at [phone] with questions or concerns regarding your invoice.   Our billing staff will not be able to assist you with questions regarding bills from these companies.  You will be contacted with the lab results as soon as they are available. The fastest way to get your results is to activate your My Chart account. Instructions are located on the last page of this paperwork. If you have not heard from [place] regarding the results in 2 weeks, please contact this office.
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
## 2016-03-31 DIAGNOSIS — F329 Major depressive disorder, single episode, unspecified: Secondary | ICD-10-CM | POA: Diagnosis not present

## 2016-03-31 DIAGNOSIS — D649 Anemia, unspecified: Secondary | ICD-10-CM | POA: Diagnosis not present

## 2016-03-31 DIAGNOSIS — I10 Essential (primary) hypertension: Secondary | ICD-10-CM | POA: Diagnosis not present

## 2016-03-31 DIAGNOSIS — Z471 Aftercare following joint replacement surgery: Secondary | ICD-10-CM | POA: Diagnosis not present

## 2016-03-31 DIAGNOSIS — K219 Gastro-esophageal reflux disease without esophagitis: Secondary | ICD-10-CM | POA: Diagnosis not present

## 2016-03-31 DIAGNOSIS — Z96612 Presence of left artificial shoulder joint: Secondary | ICD-10-CM | POA: Diagnosis not present

## 2016-04-01 ENCOUNTER — Ambulatory Visit: Payer: Medicare Other | Attending: Orthopedic Surgery | Admitting: Physical Therapy

## 2016-04-01 ENCOUNTER — Encounter: Payer: Self-pay | Admitting: Physician Assistant

## 2016-04-01 DIAGNOSIS — R600 Localized edema: Secondary | ICD-10-CM

## 2016-04-01 DIAGNOSIS — M25612 Stiffness of left shoulder, not elsewhere classified: Secondary | ICD-10-CM

## 2016-04-01 DIAGNOSIS — M25512 Pain in left shoulder: Secondary | ICD-10-CM
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
## 2016-04-05 ENCOUNTER — Ambulatory Visit: Payer: Medicare Other | Admitting: Physical Therapy

## 2016-04-05 DIAGNOSIS — M25512 Pain in left shoulder: Secondary | ICD-10-CM

## 2016-04-05 DIAGNOSIS — M25612 Stiffness of left shoulder, not elsewhere classified: Secondary | ICD-10-CM | POA: Diagnosis not present

## 2016-04-05 DIAGNOSIS — R600 Localized edema: Secondary | ICD-10-CM | POA: Diagnosis not present

## 2016-04-05 NOTE — Therapy (Signed)
[place], [place], [address] Phone: [phone]   Fax:  [phone]  Physical Therapy Treatment  Patient Details  Name: Jordan [name] MRN: [id number] Date of Birth: November 10, 1950 Referring Provider: [name]  Encounter Date: 04/05/2016      PT End of Session - 04/05/16 1151    Visit Number 2   Number of Visits 22   Date for PT Re-Evaluation 05/27/16   PT Start Time 0800   PT Stop Time [id number]   PT Time Calculation (min) 57 min   Activity Tolerance Patient tolerated treatment well   Behavior During Therapy [facility] for tasks assessed/performed      Past Medical History:  Diagnosis Date  . Anemia   . Arthritis    oa left shoulder  . Back pain    buldging disc  . Blood clot of neck vein   . Breast cancer ([place]) 10/17/2014   lower inner quadrant of the right breast  . Cancer ([place])    colon  . Constipation   . Depression    takes Wellbutrin daily  . Dizziness    r/t side effects from meds  . Family history of adverse reaction to anesthesia    It is hard for my mother to wake up from anesthesia.  . Generalized headaches    due to allergies, sinus  . GERD (gastroesophageal reflux disease)    takes Protonix as needed  . Hemorrhoids   . History of bronchitis    last time about 6-21yrs ago  . History of colon polyps   . History of hiatal hernia   . History of migraine    last one about 15+yrs ago  . History of UTI   . Hx of seasonal allergies    takes OTC allergy med nightly  . Hyperlipidemia    takes Zetia and Zocor daily  . Hypertension    takes Maxzide and Metoprolol daily  . Hypothyroidism    takes Synthroid daily  . Joint pain   . Leg swelling   . Mass of colon   . Nasal congestion   . Nausea   . Pneumonia    walking about 6-26yrs ago  . PONV (postoperative nausea and vomiting)    pt states she is very easy to sedate  . S/P radiation therapy 03/11/2015 through  04/25/2015    Right breast 4500 cGy in 25 sessions, right breast boost 1600 cGy in 8 sessions   . Thyroid disease   . Tuberculosis    as little girl    . Vertigo    takes Meclizine prn  . Wears glasses   . Wears glasses     Past Surgical History:  Procedure Laterality Date  . ABDOMINAL HYSTERECTOMY    . APPENDECTOMY    . CARPAL TUNNEL RELEASE    . [place]  . COLONOSCOPY    . ESOPHAGOGASTRODUODENOSCOPY    . EXPLORATORY LAPAROTOMY    . KNEE SURGERY  1998   right - arthroscopic  . PARTIAL COLECTOMY  03/30/2012   Procedure: PARTIAL COLECTOMY;  Surgeon: [name], MD;  Location: [facility];  Service: General;  Laterality: N/A;  . PORT-A-CATH REMOVAL N/A 06/25/2015   Procedure: REMOVAL PORT-A-CATH;  Surgeon: [name], MD;  Location: [facility];  Service: General;  Laterality: N/A;  . PORTACATH PLACEMENT    . RADIOACTIVE SEED GUIDED MASTECTOMY WITH AXILLARY SENTINEL LYMPH NODE BIOPSY Right 10/17/2014   Procedure: RADIOACTIVE  SEED GUIDED PARTIAL MASTECTOMY WITH AXILLARY SENTINEL LYMPH NODE BIOPSY;  Surgeon: [name], MD;  Location: [facility];  Service: General;  Laterality: Right;  . TOTAL SHOULDER ARTHROPLASTY Left 03/04/2016   Procedure: LEFT TOTAL SHOULDER ARTHROPLASTY;  Surgeon: [name], MD;  Location: [facility];  Service: Orthopedics;  Laterality: Left;    There were no vitals filed for this visit.      Subjective Assessment - 04/05/16 0803    Subjective Says she hasn't been doing her home exercises.  "I've had too much going on.  I haven't been at home."  Pt. does describe doing some AAROM for left shoulder flexion and abduction.  She was also told to do some cane exercises in the bed, but she hasn't done those.   Currently in Pain? Yes   Pain Score 7    Pain Location Arm   Pain Orientation Left;Anterior;Upper   Pain Descriptors / Indicators Sore   Pain Type Surgical  pain   Pain Onset More than a month ago   Aggravating Factors  trying to use it   Pain Relieving Factors pain meds, muscle relaxants            [facility] PT Assessment - 04/05/16 0001      ROM / Strength   AROM / PROM / Strength PROM     PROM   PROM Assessment Site Shoulder   Right/Left Shoulder Left   Left Shoulder Flexion 100 Degrees  limit per protocol   Left Shoulder ABduction 60 Degrees  per protocol limits   Left Shoulder Internal Rotation --  to abdomen   Left Shoulder External Rotation 30 Degrees  per protocol limits                     [facility] Adult PT Treatment/Exercise - 04/05/16 0001      Shoulder Exercises: Supine   Other Supine Exercises AA left shoulder ER to 30 degrees, abduction to 60 degrees, and flexion to 100 degrees, per protocol limits, x 7 reps each     Shoulder Exercises: ROM/Strengthening   Other ROM/Strengthening Exercises supine isometric holds for left shoulder er in 0 and 30 degrees of ER, for abduction at approx. 45 degrees of abduction, and for flexion at 90 degrees of flexion, 5 count holds x 5 each   Other ROM/Strengthening Exercises rhythmic stabilization of left shoulder in flexion/extension, with shoulder supported in approx. 90 degrees flexion in supine     Modalities   Modalities Cryotherapy     Cryotherapy   Number Minutes Cryotherapy 10 Minutes   Cryotherapy Location Shoulder  left   Type of Cryotherapy Ice pack     Manual Therapy   Manual Therapy Passive ROM   Passive ROM of left shoulder to protocol limits, (er 30 degrees, abduction 60 degrees, and flexion 100 degrees), and measurements taken                PT Education - 04/05/16 1151    Education provided Yes   Education Details avoid active use of left arm at this time, including for cooking   Person(s) Educated Patient   Methods Explanation   Comprehension Verbalized understanding           Short Term Clinic Goals - 04/01/16 1211      CC  Short Term Goal  #1   Title Pt to demonstrate 40 degrees of PROM left shoulder ER in scapular plane 30 degrees   Time 4  Period Weeks   Status New     CC Short Term Goal  #2   Title Pt to demonstate 90 degrees of flexion during AAROM exercises   Time 4   Period Weeks   Status New     CC Short Term Goal  #3   Title Pt to demonstrate 30 degrees of PROM IR in scapular plane 45 degrees to side   Time 4   Period Weeks   Status New             Long Term Clinic Goals - 04/01/16 1215      CC Long Term Goal  #1   Title Pt will demonstrate 160 degrees of flexion in prone   Time 8   Period Weeks   Status New     CC Long Term Goal  #2   Title Pt will demonstrate 75 degrees of external rotation in 90 degrees of abduction in prone   Time 8   Period Weeks   Status New     CC Long Term Goal  #3   Title Pt will demonstrate 60 degrees of internal rotation at 90 degrees of abduction   Time 8   Period Weeks   Status New     CC Long Term Goal  #4   Title Pt will be independent in a home exercise program for gentle stretching and strengthening per protocol   Time 8   Period Weeks   Status New            Plan - 04/05/16 1152    Clinical Impression Statement Patient got to the MD-prescribed limits of PROM for left shoulder without difficulty today.  She may have been overdoing things at home, saying she did some cooking and used the left arm actively a bit for that, so was advised to avoid activity.  She has not done much with her home exercise program of late, but this doesn't seem to have set her back at this early stage in her rehab, since she is allowed to do little at this point.  She is now 4.5 weeks out from surgery.  Patient reported her shoulder felt more relaxed, less painful at end of session (even before cold pack was applied to finish session).   Rehab Potential Good   Clinical Impairments Affecting Rehab Potential at home social situation   PT Frequency 3x / week    PT Duration 8 weeks   PT Treatment/Interventions Manual techniques;Passive range of motion;Therapeutic exercise   PT Next Visit Plan Continue AA/ROM and isometric strengthening within protocol limits.  Go over patient's HEP and make any changes needed.   PT Home Exercise Plan AA/ROM for left shoulder flexion and abduction to prescribed limits.   Consulted and Agree with Plan of Care Patient      Patient will benefit from skilled therapeutic intervention in order to improve the following deficits and impairments:  Increased edema, Decreased knowledge of precautions, Decreased range of motion, Decreased strength, Impaired UE functional use, Increased fascial restricitons, Pain  Visit Diagnosis: Stiffness of left shoulder, not elsewhere classified  Pain in left shoulder     Problem List Patient Active Problem List   Diagnosis Date Noted  . S/P shoulder replacement 03/04/2016  . Constipation 12/16/2014  . Chemotherapy-induced nausea 12/16/2014  . Hyperglycemia 12/09/2014  . 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 Leukocytosis   . DVT (deep venous thrombosis) ([place]) 12/02/2014  . Osteoarthritis of both feet 11/26/2014  .  Poor venous access   . Colorectal cancer ([place]) 10/03/2014  . Breast cancer of lower-inner quadrant of right female breast ([place]) 09/27/2014  . Severe obesity (BMI >= 40) ([place]) 11/06/2013  . Anemia, iron deficiency 04/25/2013  . GERD (gastroesophageal reflux disease) 02/12/2013  . HTN (hypertension) 10/21/2011  . Hypothyroidism 10/21/2011  . Depression 10/21/2011  . Hyperlipidemia 10/21/2011  . Lung nodules 10/21/2011  . Vertigo 10/21/2011  . Vitamin D deficiency 10/21/2011    Jordan [name] 04/05/2016, 11:59 AM  [place], [place], [address] Phone: [phone]   Fax:  [phone]  Name: Jordan [name] MRN: [id number] Date of Birth: June 23, 1951   Serafina Royals,  PT 04/05/16 11:59 AM

## 2016-04-07 ENCOUNTER — Ambulatory Visit: Payer: Medicare Other

## 2016-04-07 DIAGNOSIS — M25512 Pain in left shoulder: Secondary | ICD-10-CM

## 2016-04-07 DIAGNOSIS — M25612 Stiffness of left shoulder, not elsewhere classified: Secondary | ICD-10-CM | POA: Diagnosis not present

## 2016-04-07 DIAGNOSIS — R600 Localized edema: Secondary | ICD-10-CM | POA: Diagnosis not present

## 2016-04-07 NOTE — Patient Instructions (Addendum)
Flexion (Isometric)    Press right fist against wall. Hold __5__ seconds. Repeat _5-10___ times. Do __1-2__ sessions per day.  http://gt2.exer.us/114   Copyright  VHI. All rights reserved.   Extension (Isometric)    Place left bent elbow and back of arm against wall. Press elbow against wall. Hold __5__ seconds. Repeat _5-10___ times. Do _1-2___ sessions per day.  http://gt2.exer.us/112   Copyright  VHI. All rights reserved.   Strengthening: Isometric External Rotation    Using wall to provide resistance, and keeping right arm at side, press back of hand into ball using light pressure. Hold __5__ seconds. Repeat _5-10___ times per set. Do _1-2___ sessions per day.  http://orth.exer.us/815   Copyright  VHI. All rights reserved.    Cancer Rehab [phone]

## 2016-04-07 NOTE — Therapy (Signed)
Jordan [name], [place], [address] Phone: [phone]   Fax:  [phone]  Physical Therapy Treatment  Patient Details  Name: Jordan [name] MRN: [id number] Date of Birth: 1950-09-24 Referring Provider: [name]  Encounter Date: 04/07/2016      PT End of Session - 04/07/16 1502    Visit Number 3   Number of Visits 22   Date for PT Re-Evaluation 05/27/16   PT Start Time 3846   PT Stop Time 1519   PT Time Calculation (min) 45 min   Activity Tolerance Patient tolerated treatment well   Behavior During Therapy [facility] for tasks assessed/performed      Past Medical History:  Diagnosis Date  . Anemia   . Arthritis    oa left shoulder  . Back pain    buldging disc  . Blood clot of neck vein   . Breast cancer ([place]) 10/17/2014   lower inner quadrant of the right breast  . Cancer ([place])    colon  . Constipation   . Depression    takes Wellbutrin daily  . Dizziness    r/t side effects from meds  . Family history of adverse reaction to anesthesia    It is hard for my mother to wake up from anesthesia.  . Generalized headaches    due to allergies, sinus  . GERD (gastroesophageal reflux disease)    takes Protonix as needed  . Hemorrhoids   . History of bronchitis    last time about 6-39yr ago  . History of colon polyps   . History of hiatal hernia   . History of migraine    last one about 15+yrs ago  . History of UTI   . Hx of seasonal allergies    takes OTC allergy med nightly  . Hyperlipidemia    takes Zetia and Zocor daily  . Hypertension    takes Maxzide and Metoprolol daily  . Hypothyroidism    takes Synthroid daily  . Joint pain   . Leg swelling   . Mass of colon   . Nasal congestion   . Nausea   . Pneumonia    walking about 6-778yrago  . PONV (postoperative nausea and vomiting)    pt states she is very easy to sedate  . S/P radiation therapy 03/11/2015 through  04/25/2015    Right breast 4500 cGy in 25 sessions, right breast boost 1600 cGy in 8 sessions   . Thyroid disease   . Tuberculosis    as little girl    . Vertigo    takes Meclizine prn  . Wears glasses   . Wears glasses     Past Surgical History:  Procedure Laterality Date  . ABDOMINAL HYSTERECTOMY    . APPENDECTOMY    . CARPAL TUNNEL RELEASE    . [place]. COLONOSCOPY    . ESOPHAGOGASTRODUODENOSCOPY    . EXPLORATORY LAPAROTOMY    . KNEE SURGERY  1998   right - arthroscopic  . PARTIAL COLECTOMY  03/30/2012   Procedure: PARTIAL COLECTOMY;  Surgeon: JaGwenyth OberMD;  [facility] Service: General;  Laterality: N/A;  . PORT-A-CATH REMOVAL N/A 06/25/2015   Procedure: REMOVAL PORT-A-CATH;  Surgeon: PaAutumn MessingII, MD;  [facility] Service: General;  Laterality: N/A;  . PORTACATH PLACEMENT    . RADIOACTIVE SEED GUIDED MASTECTOMY WITH AXILLARY SENTINEL LYMPH NODE BIOPSY Right 10/17/2014   Procedure: RADIOACTIVE  SEED GUIDED PARTIAL MASTECTOMY WITH AXILLARY SENTINEL LYMPH NODE BIOPSY;  Surgeon: [name], MD;  Location: [facility];  Service: General;  Laterality: Right;  . TOTAL SHOULDER ARTHROPLASTY Left 03/04/2016   Procedure: LEFT TOTAL SHOULDER ARTHROPLASTY;  Surgeon: [name], MD;  Location: [facility];  Service: Orthopedics;  Laterality: Left;    There were no vitals filed for this visit.      Subjective Assessment - 04/07/16 1448    Subjective The ice pack really bothered me last time, I felt really cold all over for awhile after that day. Today I'm hurting just a little.   Pertinent History Colon cancer 2013 with resection.  Breast cancer on right 2016 with chemo (finished July 2016) and radiation (finished early September 2016).  HTN (in flux--pt. will talk to PCP about that).  Has a RTC tear in left shoulder and two cysts there, diagnosed by orthopedist.  Blood  clot in neck spring 2016.  Had an UE  Doppler when the left arm swelling started and they found no evidence of blood clot for this episode.  Uses a cane for gait because of knee and ankle arthritis bilat. Orthopedist had her go ahead with rehab with [place] and wants to reassess in six weeks.   Currently in Pain? Yes   Pain Score 5    Pain Location Shoulder   Pain Orientation Left   Pain Descriptors / Indicators Sore   Pain Type Surgical pain   Pain Onset More than a month ago   Pain Frequency Intermittent   Aggravating Factors  overusing it   Pain Relieving Factors pain meds and muscle relaxers                         [facility] Adult PT Treatment/Exercise - 04/07/16 0001      Shoulder Exercises: Supine   Flexion AAROM;Strengthening;Left  Rhythmic stabilization with mild resistance at 90 degrees    Other Supine Exercises AA/ROM left shoulder ER to 30 degrees, abduction to 60 degrees, and flexion to 100 degrees, per protocol limits, x 7 reps each performed by Saverio Danker, SPT observed by therapist.      Shoulder Exercises: Sidelying   External Rotation AAROM;Left;10 reps  In Rt S/L with AA for end ROM     Shoulder Exercises: Standing   External Rotation --   Flexion --   Extension --     Shoulder Exercises: Pulleys   Flexion 2 minutes  VC during to decrease scapular compensations     Shoulder Exercises: Isometric Strengthening   Flexion 5X5"  Lt UE in doorframe   Flexion Limitations VC for correct technique   Extension 5X5"  Lt UE in doorframe   External Rotation 5X5"  Lt UE in doorframe     Manual Therapy   Manual Therapy Passive ROM   Passive ROM of left shoulder to protocol limits, (er 30 degrees, abduction 60 degrees, and flexion 100 degrees) performed by Saverio Danker, SPT with therapist observing                PT Education - 04/07/16 1457    Education provided Yes   Education Details Standing isometric strengthening   Person(s) Educated  Patient   Methods Explanation;Demonstration;Handout;Verbal cues   Comprehension Verbalized understanding;Returned demonstration;Need further instruction           Short Term Clinic Goals - 04/07/16 1735      CC Short Term Goal  #1  Title Pt to demonstrate 40 degrees of PROM left shoulder ER in scapular plane 30 degrees   Status On-going     CC Short Term Goal  #2   Title Pt to demonstate 90 degrees of flexion during AAROM exercises   Baseline Able to do this in supine-04/07/16   Status Partially Met     CC Short Term Goal  #3   Title Pt to demonstrate 30 degrees of PROM IR in scapular plane 45 degrees to side   Status On-going             Long Term Clinic Goals - 04/01/16 1215      CC Long Term Goal  #1   Title Pt will demonstrate 160 degrees of flexion in prone   Time 8   Period Weeks   Status New     CC Long Term Goal  #2   Title Pt will demonstrate 75 degrees of external rotation in 90 degrees of abduction in prone   Time 8   Period Weeks   Status New     CC Long Term Goal  #3   Title Pt will demonstrate 60 degrees of internal rotation at 90 degrees of abduction   Time 8   Period Weeks   Status New     CC Long Term Goal  #4   Title Pt will be independent in a home exercise program for gentle stretching and strengthening per protocol   Time 8   Period Weeks   Status New            Plan - 04/07/16 1520    Clinical Impression Statement Pt reports her pain a little better today than last visit though she does not want to do ice due to feeling like it really increased her pain after last visit. She tolerated new isometric exercises well with no increase in pain during or after, and new Rt S/L exercises as well.    Rehab Potential Good   Clinical Impairments Affecting Rehab Potential at home social situation   PT Frequency 3x / week   PT Duration 8 weeks   PT Treatment/Interventions Manual techniques;Passive range of motion;Therapeutic exercise   PT  Next Visit Plan Measure A/ROM per protocol next visit. Continue AA/ROM and isometric strengthening within protocol limits.  Go over patient's HEP and review isometrics issued today.   PT Home Exercise Plan AA/ROM for left shoulder flexion and abduction to prescribed limits.   Recommended Other Services Week of 8/28 is week 5 of protocol   Consulted and Agree with Plan of Care Patient     This entire session was guided, instructed, and directly supervised by  Collie Siad, PTA.   Patient will benefit from skilled therapeutic intervention in order to improve the following deficits and impairments:  Increased edema, Decreased knowledge of precautions, Decreased range of motion, Decreased strength, Impaired UE functional use, Increased fascial restricitons, Pain  Visit Diagnosis: Stiffness of left shoulder, not elsewhere classified  Pain in left shoulder     Problem List Patient Active Problem List   Diagnosis Date Noted  . S/P shoulder replacement 03/04/2016  . Constipation 12/16/2014  . Chemotherapy-induced nausea 12/16/2014  . Hyperglycemia 12/09/2014  . 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 Leukocytosis   . DVT (deep venous thrombosis) ([place]) 12/02/2014  . Osteoarthritis of both feet 11/26/2014  . Poor venous access   . Colorectal cancer ([place]) 10/03/2014  . Breast cancer of lower-inner quadrant of right female  breast ([place]) 09/27/2014  . Severe obesity (BMI >= 40) ([place]) 11/06/2013  . Anemia, iron deficiency 04/25/2013  . GERD (gastroesophageal reflux disease) 02/12/2013  . HTN (hypertension) 10/21/2011  . Hypothyroidism 10/21/2011  . Depression 10/21/2011  . Hyperlipidemia 10/21/2011  . Lung nodules 10/21/2011  . Vertigo 10/21/2011  . Vitamin D deficiency 10/21/2011    Otelia Limes, PTA 04/07/2016, 5:39 PM  [place], [place], [address] Phone: [phone]   Fax:   [phone]  Name: Jordan [name] MRN: [id number] Date of Birth: 05-Dec-1950

## 2016-04-12 ENCOUNTER — Ambulatory Visit: Payer: Medicare Other | Admitting: Physical Therapy

## 2016-04-12 DIAGNOSIS — R600 Localized edema: Secondary | ICD-10-CM | POA: Diagnosis not present

## 2016-04-12 DIAGNOSIS — M25512 Pain in left shoulder: Secondary | ICD-10-CM | POA: Diagnosis not present

## 2016-04-12 DIAGNOSIS — M25612 Stiffness of left shoulder, not elsewhere classified: Secondary | ICD-10-CM

## 2016-04-12 NOTE — Therapy (Signed)
[place], [place], [address] Phone: [phone]   Fax:  [phone]  Physical Therapy Treatment  Patient Details  Name: Jordan [name] MRN: [id number] Date of Birth: 24-Nov-1950 Referring Provider: [name]  Encounter Date: 04/12/2016      PT End of Session - 04/12/16 1521    Visit Number 4   Number of Visits 22   Date for PT Re-Evaluation 05/27/16   PT Start Time 9417   PT Stop Time 1514   PT Time Calculation (min) 45 min   Activity Tolerance Patient tolerated treatment well   Behavior During Therapy [facility] for tasks assessed/performed      Past Medical History:  Diagnosis Date  . Anemia   . Arthritis    oa left shoulder  . Back pain    buldging disc  . Blood clot of neck vein   . Breast cancer ([place]) 10/17/2014   lower inner quadrant of the right breast  . Cancer ([place])    colon  . Constipation   . Depression    takes Wellbutrin daily  . Dizziness    r/t side effects from meds  . Family history of adverse reaction to anesthesia    It is hard for my mother to wake up from anesthesia.  . Generalized headaches    due to allergies, sinus  . GERD (gastroesophageal reflux disease)    takes Protonix as needed  . Hemorrhoids   . History of bronchitis    last time about 6-71yr ago  . History of colon polyps   . History of hiatal hernia   . History of migraine    last one about 15+yrs ago  . History of UTI   . Hx of seasonal allergies    takes OTC allergy med nightly  . Hyperlipidemia    takes Zetia and Zocor daily  . Hypertension    takes Maxzide and Metoprolol daily  . Hypothyroidism    takes Synthroid daily  . Joint pain   . Leg swelling   . Mass of colon   . Nasal congestion   . Nausea   . Pneumonia    walking about 6-789yrago  . PONV (postoperative nausea and vomiting)    pt states she is very easy to sedate  . S/P radiation therapy 03/11/2015 through  04/25/2015    Right breast 4500 cGy in 25 sessions, right breast boost 1600 cGy in 8 sessions   . Thyroid disease   . Tuberculosis    as little girl    . Vertigo    takes Meclizine prn  . Wears glasses   . Wears glasses     Past Surgical History:  Procedure Laterality Date  . ABDOMINAL HYSTERECTOMY    . APPENDECTOMY    . CARPAL TUNNEL RELEASE    . CEBode. COLONOSCOPY    . ESOPHAGOGASTRODUODENOSCOPY    . EXPLORATORY LAPAROTOMY    . KNEE SURGERY  1998   right - arthroscopic  . PARTIAL COLECTOMY  03/30/2012   Procedure: PARTIAL COLECTOMY;  Surgeon: JaGwenyth OberMD;  [facility] Service: General;  Laterality: N/A;  . PORT-A-CATH REMOVAL N/A 06/25/2015   Procedure: REMOVAL PORT-A-CATH;  Surgeon: PaAutumn MessingII, MD;  [facility] Service: General;  Laterality: N/A;  . PORTACATH PLACEMENT    . RADIOACTIVE SEED GUIDED MASTECTOMY WITH AXILLARY SENTINEL LYMPH NODE BIOPSY Right 10/17/2014   Procedure: RADIOACTIVE  SEED GUIDED PARTIAL MASTECTOMY WITH AXILLARY SENTINEL LYMPH NODE BIOPSY;  Surgeon: [name], MD;  Location: [place];  Service: General;  Laterality: Right;  . TOTAL SHOULDER ARTHROPLASTY Left 03/04/2016   Procedure: LEFT TOTAL SHOULDER ARTHROPLASTY;  Surgeon: [name], MD;  Location: [place];  Service: Orthopedics;  Laterality: Left;    There were no vitals filed for this visit.      Subjective Assessment - 04/12/16 1435    Subjective "It hurts when I take a bath and change my clothes, but then it calms down." Patient reports she had a lot of pain following last session.   Pertinent History Colon cancer 2013 with resection.  Breast cancer on right 2016 with chemo (finished July 2016) and radiation (finished early September 2016).  HTN (in flux--pt. will talk to PCP about that).  Has a RTC tear in left shoulder and two cysts there, diagnosed by  orthopedist.  Blood clot in neck spring 2016.  Had an UE  Doppler when the left arm swelling started and they found no evidence of blood clot for this episode.  Uses a cane for gait because of knee and ankle arthritis bilat. Orthopedist had her go ahead with rehab with [place] and wants to reassess in six weeks.   Currently in Pain? Yes   Pain Score 3    Pain Location Shoulder   Pain Orientation Left   Aggravating Factors  using it   Pain Relieving Factors rest            [facility] PT Assessment - 04/12/16 0001      PROM   Left Shoulder Flexion 93 Degrees   Left Shoulder ABduction 60 Degrees   Left Shoulder External Rotation 26 Degrees                     [facility] Adult PT Treatment/Exercise - 04/12/16 0001      Shoulder Exercises: Supine   Flexion AAROM;Strengthening;Left  Rhythmic stabilization with mild resistance at 90 degrees    Other Supine Exercises AA/ROM left shoulder ER to 30 degrees, abduction to 60 degrees, and flexion to 100 degrees, per protocol limits, x 10 reps each      Shoulder Exercises: Sidelying   External Rotation AAROM;Left;10 reps  In Rt S/L with AA for end ROM     Shoulder Exercises: Pulleys   Flexion 3 minutes     Shoulder Exercises: Isometric Strengthening   Flexion 5X5"  Lt UE in doorframe   Flexion Limitations VC for correct technique   Extension 5X5"  Lt UE in doorframe   External Rotation 5X5"  Lt UE in doorframe, then 3 against therapist resistance     Manual Therapy   Manual Therapy Passive ROM;Other (comment)   Passive ROM of left shoulder to protocol limits, (er 30 degrees, abduction 60 degrees, and flexion 100 degrees)   Other Manual Therapy soft tissue for pain relief and relaxation at left shoulder                   Short Term Clinic Goals - 04/07/16 1735      CC Short Term Goal  #1   Title Pt to demonstrate 40 degrees of PROM left shoulder ER in scapular plane 30 degrees   Status On-going     CC Short Term  Goal  #2   Title Pt to demonstate 90 degrees of flexion during AAROM exercises   Baseline Able to do this in supine-04/07/16  Status Partially Met     CC Short Term Goal  #3   Title Pt to demonstrate 30 degrees of PROM IR in scapular plane 45 degrees to side   Status On-going             Long Term Clinic Goals - 04/01/16 1215      CC Long Term Goal  #1   Title Pt will demonstrate 160 degrees of flexion in prone   Time 8   Period Weeks   Status New     CC Long Term Goal  #2   Title Pt will demonstrate 75 degrees of external rotation in 90 degrees of abduction in prone   Time 8   Period Weeks   Status New     CC Long Term Goal  #3   Title Pt will demonstrate 60 degrees of internal rotation at 90 degrees of abduction   Time 8   Period Weeks   Status New     CC Long Term Goal  #4   Title Pt will be independent in a home exercise program for gentle stretching and strengthening per protocol   Time 8   Period Weeks   Status New            Plan - 04/12/16 1522    Clinical Impression Statement Patient states she has been doing her isometric exercises at home without any issues or pain. Patient reporting some increase in pain following left shoulder AAROM exercises. Therapist performed some soft tissue work at the left shoulder at the end of the session for muscle relaxation and pain relief, which seemed to help.   Rehab Potential Good   Clinical Impairments Affecting Rehab Potential at home social situation   PT Frequency 3x / week   PT Duration 8 weeks   PT Treatment/Interventions Manual techniques;Passive range of motion;Therapeutic exercise   PT Next Visit Plan Continue AAROM and isometric strengthening per protocol, e-stim to left shoulder for pain relief   Recommended Other Services week of 8/28 is week 5 of protocol   Consulted and Agree with Plan of Care Patient      Patient will benefit from skilled therapeutic intervention in order to improve the following  deficits and impairments:  Increased edema, Decreased knowledge of precautions, Decreased range of motion, Decreased strength, Impaired UE functional use, Increased fascial restricitons, Pain  Visit Diagnosis: Stiffness of left shoulder, not elsewhere classified  Pain in left shoulder     Problem List Patient Active Problem List   Diagnosis Date Noted  . S/P shoulder replacement 03/04/2016  . Constipation 12/16/2014  . Chemotherapy-induced nausea 12/16/2014  . Hyperglycemia 12/09/2014  . 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 Leukocytosis   . DVT (deep venous thrombosis) ([place]) 12/02/2014  . Osteoarthritis of both feet 11/26/2014  . Poor venous access   . Colorectal cancer ([place]) 10/03/2014  . Breast cancer of lower-inner quadrant of right female breast ([place]) 09/27/2014  . Severe obesity (BMI >= 40) ([place]) 11/06/2013  . Anemia, iron deficiency 04/25/2013  . GERD (gastroesophageal reflux disease) 02/12/2013  . HTN (hypertension) 10/21/2011  . Hypothyroidism 10/21/2011  . Depression 10/21/2011  . Hyperlipidemia 10/21/2011  . Lung nodules 10/21/2011  . Vertigo 10/21/2011  . Vitamin D deficiency 10/21/2011    Mellody Life, SPT 04/12/2016, 3:30 PM  [place], [place], [address] Phone: [phone]   Fax:  [phone]  Name: Jordan [name] MRN: [id number]  Date of Birth: 1950-10-11

## 2016-04-12 NOTE — Therapy (Deleted)
[place], [place], [address] Phone: [phone]   Fax:  [phone]  Physical Therapy Treatment  Patient Details  Name: Jordan [name] MRN: [id number] Date of Birth: 24-Nov-1950 Referring Provider: [name]  Encounter Date: 04/12/2016      PT End of Session - 04/12/16 1521    Visit Number 4   Number of Visits 22   Date for PT Re-Evaluation 05/27/16   PT Start Time 9417   PT Stop Time 1514   PT Time Calculation (min) 45 min   Activity Tolerance Patient tolerated treatment well   Behavior During Therapy [facility] for tasks assessed/performed      Past Medical History:  Diagnosis Date  . Anemia   . Arthritis    oa left shoulder  . Back pain    buldging disc  . Blood clot of neck vein   . Breast cancer ([place]) 10/17/2014   lower inner quadrant of the right breast  . Cancer ([place])    colon  . Constipation   . Depression    takes Wellbutrin daily  . Dizziness    r/t side effects from meds  . Family history of adverse reaction to anesthesia    It is hard for my mother to wake up from anesthesia.  . Generalized headaches    due to allergies, sinus  . GERD (gastroesophageal reflux disease)    takes Protonix as needed  . Hemorrhoids   . History of bronchitis    last time about 6-71yr ago  . History of colon polyps   . History of hiatal hernia   . History of migraine    last one about 15+yrs ago  . History of UTI   . Hx of seasonal allergies    takes OTC allergy med nightly  . Hyperlipidemia    takes Zetia and Zocor daily  . Hypertension    takes Maxzide and Metoprolol daily  . Hypothyroidism    takes Synthroid daily  . Joint pain   . Leg swelling   . Mass of colon   . Nasal congestion   . Nausea   . Pneumonia    walking about 6-789yrago  . PONV (postoperative nausea and vomiting)    pt states she is very easy to sedate  . S/P radiation therapy 03/11/2015 through  04/25/2015    Right breast 4500 cGy in 25 sessions, right breast boost 1600 cGy in 8 sessions   . Thyroid disease   . Tuberculosis    as little girl    . Vertigo    takes Meclizine prn  . Wears glasses   . Wears glasses     Past Surgical History:  Procedure Laterality Date  . ABDOMINAL HYSTERECTOMY    . APPENDECTOMY    . CARPAL TUNNEL RELEASE    . CEBode. COLONOSCOPY    . ESOPHAGOGASTRODUODENOSCOPY    . EXPLORATORY LAPAROTOMY    . KNEE SURGERY  1998   right - arthroscopic  . PARTIAL COLECTOMY  03/30/2012   Procedure: PARTIAL COLECTOMY;  Surgeon: JaGwenyth OberMD;  [facility] Service: General;  Laterality: N/A;  . PORT-A-CATH REMOVAL N/A 06/25/2015   Procedure: REMOVAL PORT-A-CATH;  Surgeon: PaAutumn MessingII, MD;  [facility] Service: General;  Laterality: N/A;  . PORTACATH PLACEMENT    . RADIOACTIVE SEED GUIDED MASTECTOMY WITH AXILLARY SENTINEL LYMPH NODE BIOPSY Right 10/17/2014   Procedure: RADIOACTIVE  SEED GUIDED PARTIAL MASTECTOMY WITH AXILLARY SENTINEL LYMPH NODE BIOPSY;  Surgeon: [name], MD;  Location: [facility];  Service: General;  Laterality: Right;  . TOTAL SHOULDER ARTHROPLASTY Left 03/04/2016   Procedure: LEFT TOTAL SHOULDER ARTHROPLASTY;  Surgeon: [name], MD;  Location: [facility];  Service: Orthopedics;  Laterality: Left;    There were no vitals filed for this visit.      Subjective Assessment - 04/12/16 1435    Subjective "It hurts when I take a bath and change my clothes, but then it calms down."   Pertinent History Colon cancer 2013 with resection.  Breast cancer on right 2016 with chemo (finished July 2016) and radiation (finished early September 2016).  HTN (in flux--pt. will talk to PCP about that).  Has a RTC tear in left shoulder and two cysts there, diagnosed by orthopedist.  Blood clot in neck spring 2016.  Had an UE  Doppler when  the left arm swelling started and they found no evidence of blood clot for this episode.  Uses a cane for gait because of knee and ankle arthritis bilat. Orthopedist had her go ahead with rehab with [place] and wants to reassess in six weeks.   Currently in Pain? Yes   Pain Score 3    Pain Location Shoulder   Pain Orientation Left   Aggravating Factors  using it   Pain Relieving Factors rest            [facility] PT Assessment - 04/12/16 0001      PROM   Left Shoulder Flexion 93 Degrees   Left Shoulder ABduction 60 Degrees   Left Shoulder External Rotation 26 Degrees                     [facility] Adult PT Treatment/Exercise - 04/12/16 0001      Shoulder Exercises: Supine   Flexion AAROM;Strengthening;Left  Rhythmic stabilization with mild resistance at 90 degrees    Other Supine Exercises AA/ROM left shoulder ER to 30 degrees, abduction to 60 degrees, and flexion to 100 degrees, per protocol limits, x 10 reps each      Shoulder Exercises: Sidelying   External Rotation AAROM;Left;10 reps  In Rt S/L with AA for end ROM     Shoulder Exercises: Pulleys   Flexion 3 minutes     Shoulder Exercises: Isometric Strengthening   Flexion 5X5"  Lt UE in doorframe   Flexion Limitations VC for correct technique   Extension 5X5"  Lt UE in doorframe   External Rotation 5X5"  Lt UE in doorframe, then 3 against therapist resistance     Manual Therapy   Manual Therapy Passive ROM;Other (comment)   Passive ROM of left shoulder to protocol limits, (er 30 degrees, abduction 60 degrees, and flexion 100 degrees)   Other Manual Therapy soft tissue for pain relief and relaxation at left shoulder                   Short Term Clinic Goals - 04/07/16 1735      CC Short Term Goal  #1   Title Pt to demonstrate 40 degrees of PROM left shoulder ER in scapular plane 30 degrees   Status On-going     CC Short Term Goal  #2   Title Pt to demonstate 90 degrees of flexion during AAROM  exercises   Baseline Able to do this in supine-04/07/16   Status Partially Met     CC Short  Term Goal  #3   Title Pt to demonstrate 30 degrees of PROM IR in scapular plane 45 degrees to side   Status On-going             Long Term Clinic Goals - 04/01/16 1215      CC Long Term Goal  #1   Title Pt will demonstrate 160 degrees of flexion in prone   Time 8   Period Weeks   Status New     CC Long Term Goal  #2   Title Pt will demonstrate 75 degrees of external rotation in 90 degrees of abduction in prone   Time 8   Period Weeks   Status New     CC Long Term Goal  #3   Title Pt will demonstrate 60 degrees of internal rotation at 90 degrees of abduction   Time 8   Period Weeks   Status New     CC Long Term Goal  #4   Title Pt will be independent in a home exercise program for gentle stretching and strengthening per protocol   Time 8   Period Weeks   Status New            Plan - 04/12/16 1522    Clinical Impression Statement Patient states she has been doing her isometric exercises at home without any issues or pain. Patient reporting some increase in pain following left shoulder AAROM exercises. Therapist performed some soft tissue work at the left shoulder at the end of the session for muscle relaxation and pain relief, which seemed to help.   Rehab Potential Good   Clinical Impairments Affecting Rehab Potential at home social situation   PT Frequency 3x / week   PT Duration 8 weeks   PT Treatment/Interventions Manual techniques;Passive range of motion;Therapeutic exercise   PT Next Visit Plan Continue AAROM and isometric strengthening per protocol, e-stim to left shoulder for pain relief   Recommended Other Services week of 8/28 is week 5 of protocol   Consulted and Agree with Plan of Care Patient      Patient will benefit from skilled therapeutic intervention in order to improve the following deficits and impairments:  Increased edema, Decreased knowledge of  precautions, Decreased range of motion, Decreased strength, Impaired UE functional use, Increased fascial restricitons, Pain  Visit Diagnosis: Stiffness of left shoulder, not elsewhere classified  Pain in left shoulder     Problem List Patient Active Problem List   Diagnosis Date Noted  . S/P shoulder replacement 03/04/2016  . Constipation 12/16/2014  . Chemotherapy-induced nausea 12/16/2014  . Hyperglycemia 12/09/2014  . 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 Leukocytosis   . DVT (deep venous thrombosis) ([place]) 12/02/2014  . Osteoarthritis of both feet 11/26/2014  . Poor venous access   . Colorectal cancer ([place]) 10/03/2014  . Breast cancer of lower-inner quadrant of right female breast ([place]) 09/27/2014  . Severe obesity (BMI >= 40) ([place]) 11/06/2013  . Anemia, iron deficiency 04/25/2013  . GERD (gastroesophageal reflux disease) 02/12/2013  . HTN (hypertension) 10/21/2011  . Hypothyroidism 10/21/2011  . Depression 10/21/2011  . Hyperlipidemia 10/21/2011  . Lung nodules 10/21/2011  . Vertigo 10/21/2011  . Vitamin D deficiency 10/21/2011    Mellody Life, SPT 04/12/2016, 3:27 PM  [place], [place], [address] Phone: [phone]   Fax:  [phone]  Name: Jordan [name] MRN: [id number] Date of Birth: April 22, 1951

## 2016-04-12 NOTE — Therapy (Addendum)
[place], [place], [address] Phone: [phone]   Fax:  [phone]  Physical Therapy Treatment  Patient Details  Name: Jordan [name] MRN: [id number] Date of Birth: 24-Nov-1950 Referring Provider: [name]  Encounter Date: 04/12/2016      PT End of Session - 04/12/16 1521    Visit Number 4   Number of Visits 22   Date for PT Re-Evaluation 05/27/16   PT Start Time 9417   PT Stop Time 1514   PT Time Calculation (min) 45 min   Activity Tolerance Patient tolerated treatment well   Behavior During Therapy [facility] for tasks assessed/performed      Past Medical History:  Diagnosis Date  . Anemia   . Arthritis    oa left shoulder  . Back pain    buldging disc  . Blood clot of neck vein   . Breast cancer ([place]) 10/17/2014   lower inner quadrant of the right breast  . Cancer ([place])    colon  . Constipation   . Depression    takes Wellbutrin daily  . Dizziness    r/t side effects from meds  . Family history of adverse reaction to anesthesia    It is hard for my mother to wake up from anesthesia.  . Generalized headaches    due to allergies, sinus  . GERD (gastroesophageal reflux disease)    takes Protonix as needed  . Hemorrhoids   . History of bronchitis    last time about 6-71yr ago  . History of colon polyps   . History of hiatal hernia   . History of migraine    last one about 15+yrs ago  . History of UTI   . Hx of seasonal allergies    takes OTC allergy med nightly  . Hyperlipidemia    takes Zetia and Zocor daily  . Hypertension    takes Maxzide and Metoprolol daily  . Hypothyroidism    takes Synthroid daily  . Joint pain   . Leg swelling   . Mass of colon   . Nasal congestion   . Nausea   . Pneumonia    walking about 6-789yrago  . PONV (postoperative nausea and vomiting)    pt states she is very easy to sedate  . S/P radiation therapy 03/11/2015 through  04/25/2015    Right breast 4500 cGy in 25 sessions, right breast boost 1600 cGy in 8 sessions   . Thyroid disease   . Tuberculosis    as little girl    . Vertigo    takes Meclizine prn  . Wears glasses   . Wears glasses     Past Surgical History:  Procedure Laterality Date  . ABDOMINAL HYSTERECTOMY    . APPENDECTOMY    . CARPAL TUNNEL RELEASE    . [facility]. COLONOSCOPY    . ESOPHAGOGASTRODUODENOSCOPY    . EXPLORATORY LAPAROTOMY    . KNEE SURGERY  1998   right - arthroscopic  . PARTIAL COLECTOMY  03/30/2012   Procedure: PARTIAL COLECTOMY;  Surgeon: JaGwenyth OberMD;  [facility] Service: General;  Laterality: N/A;  . PORT-A-CATH REMOVAL N/A 06/25/2015   Procedure: REMOVAL PORT-A-CATH;  Surgeon: PaAutumn MessingII, MD;  [facility] [facility] Service: General;  Laterality: N/A;  . PORTACATH PLACEMENT    . RADIOACTIVE SEED GUIDED MASTECTOMY WITH AXILLARY SENTINEL LYMPH NODE BIOPSY Right 10/17/2014   Procedure: RADIOACTIVE  SEED GUIDED PARTIAL MASTECTOMY WITH AXILLARY SENTINEL LYMPH NODE BIOPSY;  Surgeon: [name], MD;  Location: [place];  Service: General;  Laterality: Right;  . TOTAL SHOULDER ARTHROPLASTY Left 03/04/2016   Procedure: LEFT TOTAL SHOULDER ARTHROPLASTY;  Surgeon: [name], MD;  Location: [place];  Service: Orthopedics;  Laterality: Left;    There were no vitals filed for this visit.      Subjective Assessment - 04/12/16 1435    Subjective "It hurts when I take a bath and change my clothes, but then it calms down." Patient reports she had a lot of pain following last session.   Pertinent History Colon cancer 2013 with resection.  Breast cancer on right 2016 with chemo (finished July 2016) and radiation (finished early September 2016).  HTN (in flux--pt. will talk to PCP about that).  Has a RTC tear in left shoulder and two cysts there, diagnosed by  orthopedist.  Blood clot in neck spring 2016.  Had an UE  Doppler when the left arm swelling started and they found no evidence of blood clot for this episode.  Uses a cane for gait because of knee and ankle arthritis bilat. Orthopedist had her go ahead with rehab with [place] and wants to reassess in six weeks.   Currently in Pain? Yes   Pain Score 3    Pain Location Shoulder   Pain Orientation Left   Aggravating Factors  using it   Pain Relieving Factors rest            [facility] PT Assessment - 04/12/16 0001      PROM   Left Shoulder Flexion 93 Degrees   Left Shoulder ABduction 60 Degrees   Left Shoulder External Rotation 26 Degrees                     [facility] Adult PT Treatment/Exercise - 04/12/16 0001      Shoulder Exercises: Supine   Flexion AAROM;Strengthening;Left  Rhythmic stabilization with mild resistance at 90 degrees    Other Supine Exercises AA/ROM left shoulder ER to 30 degrees, abduction to 60 degrees, and flexion to 100 degrees, per protocol limits, x 10 reps each      Shoulder Exercises: Sidelying   External Rotation AAROM;Left;10 reps  In Rt S/L with AA for end ROM     Shoulder Exercises: Pulleys   Flexion 3 minutes     Shoulder Exercises: Isometric Strengthening   Flexion 5X5"  Lt UE in doorframe   Flexion Limitations VC for correct technique   Extension 5X5"  Lt UE in doorframe   External Rotation 5X5"  Lt UE in doorframe, then 3 against therapist resistance     Manual Therapy   Manual Therapy Passive ROM;Other (comment)   Passive ROM of left shoulder to protocol limits, (er 30 degrees, abduction 60 degrees, and flexion 100 degrees)   Other Manual Therapy soft tissue for pain relief and relaxation at left shoulder                   Short Term Clinic Goals - 04/07/16 1735      CC Short Term Goal  #1   Title Pt to demonstrate 40 degrees of PROM left shoulder ER in scapular plane 30 degrees   Status On-going     CC Short Term  Goal  #2   Title Pt to demonstate 90 degrees of flexion during AAROM exercises   Baseline Able to do this in supine-04/07/16  Status Partially Met     CC Short Term Goal  #3   Title Pt to demonstrate 30 degrees of PROM IR in scapular plane 45 degrees to side   Status On-going             Long Term Clinic Goals - 04/01/16 1215      CC Long Term Goal  #1   Title Pt will demonstrate 160 degrees of flexion in prone   Time 8   Period Weeks   Status New     CC Long Term Goal  #2   Title Pt will demonstrate 75 degrees of external rotation in 90 degrees of abduction in prone   Time 8   Period Weeks   Status New     CC Long Term Goal  #3   Title Pt will demonstrate 60 degrees of internal rotation at 90 degrees of abduction   Time 8   Period Weeks   Status New     CC Long Term Goal  #4   Title Pt will be independent in a home exercise program for gentle stretching and strengthening per protocol   Time 8   Period Weeks   Status New            Plan - 04/12/16 1522    Clinical Impression Statement Patient states she has been doing her isometric exercises at home without any issues or pain. Patient reporting some increase in pain following left shoulder AAROM exercises. Therapist performed some soft tissue work at the left shoulder at the end of the session for muscle relaxation and pain relief, which seemed to help.   Rehab Potential Good   Clinical Impairments Affecting Rehab Potential at home social situation   PT Frequency 3x / week   PT Duration 8 weeks   PT Treatment/Interventions Manual techniques;Passive range of motion;Therapeutic exercise   PT Next Visit Plan Continue AAROM and isometric strengthening per protocol, e-stim to left shoulder for pain relief   Recommended Other Services week of 8/28 is week 5 of protocol   Consulted and Agree with Plan of Care Patient      Patient will benefit from skilled therapeutic intervention in order to improve the following  deficits and impairments:  Increased edema, Decreased knowledge of precautions, Decreased range of motion, Decreased strength, Impaired UE functional use, Increased fascial restricitons, Pain  Visit Diagnosis: Stiffness of left shoulder, not elsewhere classified  Pain in left shoulder     Problem List Patient Active Problem List   Diagnosis Date Noted  . S/P shoulder replacement 03/04/2016  . Constipation 12/16/2014  . Chemotherapy-induced nausea 12/16/2014  . Hyperglycemia 12/09/2014  . 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 Leukocytosis   . DVT (deep venous thrombosis) ([place]) 12/02/2014  . Osteoarthritis of both feet 11/26/2014  . Poor venous access   . Colorectal cancer ([place]) 10/03/2014  . Breast cancer of lower-inner quadrant of right female breast ([place]) 09/27/2014  . Severe obesity (BMI >= 40) ([place]) 11/06/2013  . Anemia, iron deficiency 04/25/2013  . GERD (gastroesophageal reflux disease) 02/12/2013  . HTN (hypertension) 10/21/2011  . Hypothyroidism 10/21/2011  . Depression 10/21/2011  . Hyperlipidemia 10/21/2011  . Lung nodules 10/21/2011  . Vertigo 10/21/2011  . Vitamin D deficiency 10/21/2011    SALISBURY,DONNA 04/12/2016, 9:26 PM  [place], [place], [address] Phone: [phone]   Fax:  [phone]  Name: Jordan [name] MRN: [id number] Date of Birth:  01-20-1951   This entire session was guided, instructed, and directly supervised by Serafina Royals, PT  Read, reviewed, edited and agree with student's findings and recommendations.   Serafina Royals, PT 04/12/16 9:26 PM  Serafina Royals, PT 04/15/16 7:33 AM

## 2016-04-13 ENCOUNTER — Ambulatory Visit: Payer: Medicare Other | Admitting: Physician Assistant

## 2016-04-14 ENCOUNTER — Ambulatory Visit: Payer: Medicare Other

## 2016-04-14 DIAGNOSIS — R600 Localized edema: Secondary | ICD-10-CM | POA: Diagnosis not present

## 2016-04-14 DIAGNOSIS — M25612 Stiffness of left shoulder, not elsewhere classified: Secondary | ICD-10-CM | POA: Diagnosis not present

## 2016-04-14 DIAGNOSIS — M25512 Pain in left shoulder: Secondary | ICD-10-CM | POA: Diagnosis not present

## 2016-04-14 NOTE — Therapy (Signed)
[place], [place], [address] Phone: [phone]   Fax:  [phone]  Physical Therapy Treatment  Patient Details  Name: Jordan [name] MRN: [id number] Date of Birth: February 21, 1951 Referring Provider: [name]  Encounter Date: 04/14/2016      PT End of Session - 04/14/16 1323    Visit Number 5   Number of Visits 22   Date for PT Re-Evaluation 05/27/16   PT Start Time 1302   PT Stop Time 1352   PT Time Calculation (min) 50 min   Activity Tolerance Patient tolerated treatment well   Behavior During Therapy [facility] for tasks assessed/performed      Past Medical History:  Diagnosis Date  . Anemia   . Arthritis    oa left shoulder  . Back pain    buldging disc  . Blood clot of neck vein   . Breast cancer ([place]) 10/17/2014   lower inner quadrant of the right breast  . Cancer ([place])    colon  . Constipation   . Depression    takes Wellbutrin daily  . Dizziness    r/t side effects from meds  . Family history of adverse reaction to anesthesia    It is hard for my mother to wake up from anesthesia.  . Generalized headaches    due to allergies, sinus  . GERD (gastroesophageal reflux disease)    takes Protonix as needed  . Hemorrhoids   . History of bronchitis    last time about 6-23yr ago  . History of colon polyps   . History of hiatal hernia   . History of migraine    last one about 15+yrs ago  . History of UTI   . Hx of seasonal allergies    takes OTC allergy med nightly  . Hyperlipidemia    takes Zetia and Zocor daily  . Hypertension    takes Maxzide and Metoprolol daily  . Hypothyroidism    takes Synthroid daily  . Joint pain   . Leg swelling   . Mass of colon   . Nasal congestion   . Nausea   . Pneumonia    walking about 6-740yrago  . PONV (postoperative nausea and vomiting)    pt states she is very easy to sedate  . S/P radiation therapy 03/11/2015 through  04/25/2015    Right breast 4500 cGy in 25 sessions, right breast boost 1600 cGy in 8 sessions   . Thyroid disease   . Tuberculosis    as little girl    . Vertigo    takes Meclizine prn  . Wears glasses   . Wears glasses     Past Surgical History:  Procedure Laterality Date  . ABDOMINAL HYSTERECTOMY    . APPENDECTOMY    . CARPAL TUNNEL RELEASE    . [place]. COLONOSCOPY    . ESOPHAGOGASTRODUODENOSCOPY    . EXPLORATORY LAPAROTOMY    . KNEE SURGERY  1998   right - arthroscopic  . PARTIAL COLECTOMY  03/30/2012   Procedure: PARTIAL COLECTOMY;  Surgeon: JaGwenyth OberMD;  [facility] Service: General;  Laterality: N/A;  . PORT-A-CATH REMOVAL N/A 06/25/2015   Procedure: REMOVAL PORT-A-CATH;  Surgeon: PaAutumn MessingII, MD;  Location: [facility] Service: General;  Laterality: N/A;  . PORTACATH PLACEMENT    . RADIOACTIVE SEED GUIDED MASTECTOMY WITH AXILLARY SENTINEL LYMPH NODE BIOPSY Right 10/17/2014   Procedure: RADIOACTIVE  SEED GUIDED PARTIAL MASTECTOMY WITH AXILLARY SENTINEL LYMPH NODE BIOPSY;  Surgeon: [name], MD;  Location: [place];  Service: General;  Laterality: Right;  . TOTAL SHOULDER ARTHROPLASTY Left 03/04/2016   Procedure: LEFT TOTAL SHOULDER ARTHROPLASTY;  Surgeon: [name], MD;  Location: [place];  Service: Orthopedics;  Laterality: Left;    There were no vitals filed for this visit.      Subjective Assessment - 04/14/16 1305    Subjective My Lt shoulder hurts the most when I'm trying to change clothes. I went walking at [facility] after my last visit for over an hour and I was exhausted after that and my Lt shoulder was hurting more.    Pertinent History Colon cancer 2013 with resection.  Breast cancer on right 2016 with chemo (finished July 2016) and radiation (finished early September 2016).  HTN (in flux--pt. will talk to PCP about that).  Has a RTC tear in  left shoulder and two cysts there, diagnosed by orthopedist.  Blood clot in neck spring 2016.  Had an UE  Doppler when the left arm swelling started and they found no evidence of blood clot for this episode.  Uses a cane for gait because of knee and ankle arthritis bilat. Orthopedist had her go ahead with rehab with [place] and wants to reassess in six weeks.   Currently in Pain? Yes   Pain Score 4    Pain Location Shoulder   Pain Orientation Left   Pain Descriptors / Indicators Sore   Pain Type Surgical pain   Pain Onset More than a month ago   Pain Frequency Intermittent   Aggravating Factors  getting dressed/undressed   Pain Relieving Factors rest                         [facility] Adult PT Treatment/Exercise - 04/14/16 0001      Shoulder Exercises: Supine   Flexion AAROM;Strengthening;Left  Rhythmic stabs with mild resistance at 90 degrees   Other Supine Exercises AA/ROM left shoulder ER to 30 degrees, abduction to 60 degrees, and flexion to 100 degrees, per protocol limits, x 10 reps each      Shoulder Exercises: Sidelying   External Rotation AAROM;Left;10 reps  In Rt S/L for AA for end ROM.     Shoulder Exercises: Pulleys   Flexion 3 minutes  VC to decrease scapular comprensations     Shoulder Exercises: Isometric Strengthening   Flexion Other (comment)  10x5" holds, Lt UE in doorframe   Extension Other (comment)  10x5" holds, Lt UE in doorframe   External Rotation Other (comment)  10x5" holds, Lt UE in doorframe   External Rotation Limitations Tactile cuing for correct UE position     Electrical Stimulation   Electrical Stimulation Location Lt shoulder anterior and superior aspects   Electrical Stimulation Action IFC   Electrical Stimulation Parameters 80-150 Hz x5 minutes (pt reported unable to tolerate after 5 minutes and wanted to stop)   Electrical Stimulation Goals Pain     Manual Therapy   Manual Therapy Passive ROM;Other (comment)   Passive ROM of  left shoulder to protocol limits, (er 30 degrees, abduction 60 degrees, and flexion 100 degrees)   Other Manual Therapy soft tissue for pain relief and relaxation at left anterior shoulder and at incision to pts tolerance                   Short Term Clinic Goals -  04/14/16 1349      CC Short Term Goal  #1   Title Pt to demonstrate 40 degrees of PROM left shoulder ER in scapular plane 30 degrees   Baseline 26 degrees 04/12/16 (protocol no further than 30 degrees) 04/14/16   Status On-going     CC Short Term Goal  #2   Title Pt to demonstate 90 degrees of flexion during AAROM exercises   Baseline Able to do this in supine-04/07/16   Status Partially Met     CC Short Term Goal  #3   Title Pt to demonstrate 30 degrees of PROM IR in scapular plane 45 degrees to side   Baseline unable to measure this yet per protocol 04/14/16   Status On-going             Long Term Clinic Goals - 04/01/16 1215      CC Long Term Goal  #1   Title Pt will demonstrate 160 degrees of flexion in prone   Time 8   Period Weeks   Status New     CC Long Term Goal  #2   Title Pt will demonstrate 75 degrees of external rotation in 90 degrees of abduction in prone   Time 8   Period Weeks   Status New     CC Long Term Goal  #3   Title Pt will demonstrate 60 degrees of internal rotation at 90 degrees of abduction   Time 8   Period Weeks   Status New     CC Long Term Goal  #4   Title Pt will be independent in a home exercise program for gentle stretching and strengthening per protocol   Time 8   Period Weeks   Status New            Plan - 04/14/16 1324    Clinical Impression Statement Continued todays session per protocol, but pt didn't tolerate as much AA/ROM exercises today reporting increased joint pain "that she sometimes gets". This seemed to improve after P/ROM and manual therapy. Initial trial of electrical stimulation today for pain to Lt shoulder.     Rehab Potential Good    Clinical Impairments Affecting Rehab Potential at home social situation   PT Frequency 3x / week   PT Duration 8 weeks   PT Treatment/Interventions Manual techniques;Passive range of motion;Therapeutic exercise   PT Next Visit Plan Continue AA/P/ROM and isometric strengthening per protocol, assess e-stim to left shoulder    Consulted and Agree with Plan of Care Patient      Patient will benefit from skilled therapeutic intervention in order to improve the following deficits and impairments:  Increased edema, Decreased knowledge of precautions, Decreased range of motion, Decreased strength, Impaired UE functional use, Increased fascial restricitons, Pain  Visit Diagnosis: Stiffness of left shoulder, not elsewhere classified  Pain in left shoulder  Localized edema     Problem List Patient Active Problem List   Diagnosis Date Noted  . S/P shoulder replacement 03/04/2016  . Constipation 12/16/2014  . Chemotherapy-induced nausea 12/16/2014  . Hyperglycemia 12/09/2014  . 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 Leukocytosis   . DVT (deep venous thrombosis) ([place]) 12/02/2014  . Osteoarthritis of both feet 11/26/2014  . Poor venous access   . Colorectal cancer ([place]) 10/03/2014  . Breast cancer of lower-inner quadrant of right female breast ([place]) 09/27/2014  . Severe obesity (BMI >= 40) ([place]) 11/06/2013  . Anemia, iron deficiency 04/25/2013  . GERD (gastroesophageal  reflux disease) 02/12/2013  . HTN (hypertension) 10/21/2011  . Hypothyroidism 10/21/2011  . Depression 10/21/2011  . Hyperlipidemia 10/21/2011  . Lung nodules 10/21/2011  . Vertigo 10/21/2011  . Vitamin D deficiency 10/21/2011    Otelia Limes, PTA 04/14/2016, 1:54 PM  [place], [place], [address] Phone: [phone]   Fax:  [phone]  Name: LILIYA FULLENWIDER MRN: [id number] Date of Birth: 24-Jul-1951

## 2016-04-20 ENCOUNTER — Ambulatory Visit: Payer: Medicare Other | Attending: Orthopedic Surgery

## 2016-04-20 DIAGNOSIS — M25512 Pain in left shoulder: Secondary | ICD-10-CM

## 2016-04-20 DIAGNOSIS — M79602 Pain in left arm: Secondary | ICD-10-CM | POA: Diagnosis not present

## 2016-04-20 DIAGNOSIS — R600 Localized edema: Secondary | ICD-10-CM | POA: Diagnosis not present

## 2016-04-20 DIAGNOSIS — R2232 Localized swelling, mass and lump, left upper limb: Secondary | ICD-10-CM | POA: Diagnosis not present

## 2016-04-20 DIAGNOSIS — M25612 Stiffness of left shoulder, not elsewhere classified: Secondary | ICD-10-CM | POA: Diagnosis not present

## 2016-04-20 NOTE — Therapy (Signed)
[place], [place], [address] Phone: [phone]   Fax:  [phone]  Physical Therapy Treatment  Patient Details  Name: Jordan [name] MRN: [id number] Date of Birth: Mar 03, 1951 Referring Provider: [name]  Encounter Date: 04/20/2016      PT End of Session - 04/20/16 1207    Visit Number 6   Number of Visits 22   Date for PT Re-Evaluation 05/27/16   PT Start Time 1103   PT Stop Time 1200   PT Time Calculation (min) 57 min   Activity Tolerance Patient tolerated treatment well   Behavior During Therapy [facility] for tasks assessed/performed      Past Medical History:  Diagnosis Date  . Anemia   . Arthritis    oa left shoulder  . Back pain    buldging disc  . Blood clot of neck vein   . Breast cancer ([place]) 10/17/2014   lower inner quadrant of the right breast  . Cancer ([place])    colon  . Constipation   . Depression    takes Wellbutrin daily  . Dizziness    r/t side effects from meds  . Family history of adverse reaction to anesthesia    It is hard for my mother to wake up from anesthesia.  . Generalized headaches    due to allergies, sinus  . GERD (gastroesophageal reflux disease)    takes Protonix as needed  . Hemorrhoids   . History of bronchitis    last time about 6-49yr ago  . History of colon polyps   . History of hiatal hernia   . History of migraine    last one about 15+yrs ago  . History of UTI   . Hx of seasonal allergies    takes OTC allergy med nightly  . Hyperlipidemia    takes Zetia and Zocor daily  . Hypertension    takes Maxzide and Metoprolol daily  . Hypothyroidism    takes Synthroid daily  . Joint pain   . Leg swelling   . Mass of colon   . Nasal congestion   . Nausea   . Pneumonia    walking about 6-731yrago  . PONV (postoperative nausea and vomiting)    pt states she is very easy to sedate  . S/P radiation therapy 03/11/2015 through  04/25/2015    Right breast 4500 cGy in 25 sessions, right breast boost 1600 cGy in 8 sessions   . Thyroid disease   . Tuberculosis    as little girl    . Vertigo    takes Meclizine prn  . Wears glasses   . Wears glasses     Past Surgical History:  Procedure Laterality Date  . ABDOMINAL HYSTERECTOMY    . APPENDECTOMY    . CARPAL TUNNEL RELEASE    . [place]. COLONOS[place]    . ESOPHAGOGASTRODUODENOSCOPY    . EXPLORATORY LAPAROTOMY    . KNEE SURGERY  1998   right - arthroscopic  . PARTIAL COLECTOMY  03/30/2012   Procedure: PARTIAL COLECTOMY;  Surgeon: JaGwenyth OberMD;  [place] Service: General;  Laterality: N/A;  . PORT-A-CATH REMOVAL N/A 06/25/2015   Procedure: REMOVAL PORT-A-CATH;  Surgeon: PaAutumn MessingII, MD;  [place] Service: General;  Laterality: N/A;  . PORTACATH PLACEMENT    . RADIOACTIVE SEED GUIDED MASTECTOMY WITH AXILLARY SENTINEL LYMPH NODE BIOPSY Right 10/17/2014   Procedure: RADIOACTIVE  SEED GUIDED PARTIAL MASTECTOMY WITH AXILLARY SENTINEL LYMPH NODE BIOPSY;  Surgeon: [name], MD;  Location: [place];  Service: General;  Laterality: Right;  . TOTAL SHOULDER ARTHROPLASTY Left 03/04/2016   Procedure: LEFT TOTAL SHOULDER ARTHROPLASTY;  Surgeon: [name], MD;  Location: [place];  Service: Orthopedics;  Laterality: Left;    There were no vitals filed for this visit.      Subjective Assessment - 04/20/16 1108    Subjective My Lt shoulder is feeling okay today. I woke up and my sling was behind my neck.    Pertinent History Colon cancer 2013 with resection.  Breast cancer on right 2016 with chemo (finished July 2016) and radiation (finished early September 2016).  HTN (in flux--pt. will talk to PCP about that).  Has a RTC tear in left shoulder and two cysts there, diagnosed by orthopedist.  Blood clot in neck spring 2016.  Had an UE  Doppler  when the left arm swelling started and they found no evidence of blood clot for this episode.  Uses a cane for gait because of knee and ankle arthritis bilat. Orthopedist had her go ahead with rehab with [place] and wants to reassess in six weeks.   Currently in Pain? Yes   Pain Score 3    Pain Location Shoulder   Pain Descriptors / Indicators Aching   Pain Type Surgical pain   Pain Onset More than a month ago   Pain Frequency Intermittent   Aggravating Factors  getting dressed/undressed   Pain Relieving Factors rest and pain meds                         [facility] Adult PT Treatment/Exercise - 04/20/16 0001      Shoulder Exercises: Pulleys   Flexion 3 minutes  VC to decrease scapular compensation     Shoulder Exercises: ROM/Strengthening   Other ROM/Strengthening Exercises UE Ranger with Lt UE for shoulder flexion, scaption and abduction 10 times in each position with ranger having UE ~45 degrees in sitting and with tactile and VC for decrease scapular compensation     Shoulder Exercises: Isometric Strengthening   Flexion Other (comment)  10x10" against small football on wall   Extension Other (comment)  10x10" against small football on wall   External Rotation Other (comment)  10x10" against small football on wall     Manual Therapy   Manual Therapy Passive ROM;Other (comment)   Passive ROM In Supine to Lt shoulder per new phase of protocol (Phase 2-Active Motion Phase weeks 6-12) flexion to pts tolerance, er and IR in scapular plane to tolerance.   Other Manual Therapy soft tissue for pain relief and relaxation at left anterior shoulder and at incision to pts tolerance                   Short Term Clinic Goals - 04/14/16 1349      CC Short Term Goal  #1   Title Pt to demonstrate 40 degrees of PROM left shoulder ER in scapular plane 30 degrees   Baseline 26 degrees 04/12/16 (protocol no further than 30 degrees) 04/14/16   Status On-going     CC Short Term  Goal  #2   Title Pt to demonstate 90 degrees of flexion during AAROM exercises   Baseline Able to do this in supine-04/07/16   Status Partially Met     CC Short Term Goal  #3   Title  Pt to demonstrate 30 degrees of PROM IR in scapular plane 45 degrees to side   Baseline unable to measure this yet per protocol 04/14/16   Status On-going             Long Term Clinic Goals - 04/01/16 1215      CC Long Term Goal  #1   Title Pt will demonstrate 160 degrees of flexion in prone   Time 8   Period Weeks   Status New     CC Long Term Goal  #2   Title Pt will demonstrate 75 degrees of external rotation in 90 degrees of abduction in prone   Time 8   Period Weeks   Status New     CC Long Term Goal  #3   Title Pt will demonstrate 60 degrees of internal rotation at 90 degrees of abduction   Time 8   Period Weeks   Status New     CC Long Term Goal  #4   Title Pt will be independent in a home exercise program for gentle stretching and strengthening per protocol   Time 8   Period Weeks   Status New            Plan - 04/20/16 1207    Clinical Impression Statement Pt tolerated session well today including new progression per protocol. We used the UE ranger for seated flexion, scaption and abduction all to pts tolerance but for scaption and abduction did not go higher thatn 45 degrees as this was pts limit. Last repetition of abduction pt had a sharp pain that lasted a few minutes but after rest was gone and did not persist. She also tolerated increased P/ROM well and reported feeling good after session.   Rehab Potential Good   Clinical Impairments Affecting Rehab Potential at home social situation   PT Frequency 3x / week   PT Duration 8 weeks   PT Treatment/Interventions Manual techniques;Passive range of motion;Therapeutic exercise   PT Next Visit Plan Continue AA/P/ROM and isometric strengthening per protocol, assess e-stim to left shoulder    Recommended Other Services Week of  Sept 4 is week 6 of protocol-Phase 2   Consulted and Agree with Plan of Care Patient      Patient will benefit from skilled therapeutic intervention in order to improve the following deficits and impairments:  Increased edema, Decreased knowledge of precautions, Decreased range of motion, Decreased strength, Impaired UE functional use, Increased fascial restricitons, Pain  Visit Diagnosis: Stiffness of left shoulder, not elsewhere classified  Pain in left shoulder  Localized edema     Problem List Patient Active Problem List   Diagnosis Date Noted  . S/P shoulder replacement 03/04/2016  . Constipation 12/16/2014  . Chemotherapy-induced nausea 12/16/2014  . Hyperglycemia 12/09/2014  . 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 Leukocytosis   . DVT (deep venous thrombosis) ([place]) 12/02/2014  . Osteoarthritis of both feet 11/26/2014  . Poor venous access   . Colorectal cancer ([place]) 10/03/2014  . Breast cancer of lower-inner quadrant of right female breast ([place]) 09/27/2014  . Severe obesity (BMI >= 40) ([place]) 11/06/2013  . Anemia, iron deficiency 04/25/2013  . GERD (gastroesophageal reflux disease) 02/12/2013  . HTN (hypertension) 10/21/2011  . Hypothyroidism 10/21/2011  . Depression 10/21/2011  . Hyperlipidemia 10/21/2011  . Lung nodules 10/21/2011  . Vertigo 10/21/2011  . Vitamin D deficiency 10/21/2011    Otelia Limes, PTA 04/20/2016, 12:20 PM  [facility]  [place], [place], [address] Phone: [phone]   Fax:  [phone]  Name: KASSADEE CARAWAN MRN: [id number] Date of Birth: Nov 19, 1950

## 2016-04-22 ENCOUNTER — Ambulatory Visit: Payer: Medicare Other

## 2016-04-22 DIAGNOSIS — M79602 Pain in left arm: Secondary | ICD-10-CM | POA: Diagnosis not present

## 2016-04-22 DIAGNOSIS — R600 Localized edema: Secondary | ICD-10-CM

## 2016-04-22 DIAGNOSIS — M25612 Stiffness of left shoulder, not elsewhere classified: Secondary | ICD-10-CM | POA: Diagnosis not present

## 2016-04-22 DIAGNOSIS — R2232 Localized swelling, mass and lump, left upper limb: Secondary | ICD-10-CM | POA: Diagnosis not present

## 2016-04-22 DIAGNOSIS — M25512 Pain in left shoulder: Secondary | ICD-10-CM

## 2016-04-22 NOTE — Therapy (Signed)
[place], [place], [address] Phone: [phone]   Fax:  [phone]  Physical Therapy Treatment  Patient Details  Name: Jordan [name] MRN: [id number] Date of Birth: 02/21/1951 Referring Provider: [name]  Encounter Date: 04/22/2016      PT End of Session - 04/22/16 1157    Visit Number 7   Number of Visits 22   Date for PT Re-Evaluation 05/27/16   PT Start Time 1108   PT Stop Time 1147   PT Time Calculation (min) 39 min   Activity Tolerance Patient tolerated treatment well   Behavior During Therapy [facility] for tasks assessed/performed      Past Medical History:  Diagnosis Date  . Anemia   . Arthritis    oa left shoulder  . Back pain    buldging disc  . Blood clot of neck vein   . Breast cancer ([place]) 10/17/2014   lower inner quadrant of the right breast  . Cancer ([place])    colon  . Constipation   . Depression    takes Wellbutrin daily  . Dizziness    r/t side effects from meds  . Family history of adverse reaction to anesthesia    It is hard for my mother to wake up from anesthesia.  . Generalized headaches    due to allergies, sinus  . GERD (gastroesophageal reflux disease)    takes Protonix as needed  . Hemorrhoids   . History of bronchitis    last time about 6-73yrs ago  . History of colon polyps   . History of hiatal hernia   . History of migraine    last one about 15+yrs ago  . History of UTI   . Hx of seasonal allergies    takes OTC allergy med nightly  . Hyperlipidemia    takes Zetia and Zocor daily  . Hypertension    takes Maxzide and Metoprolol daily  . Hypothyroidism    takes Synthroid daily  . Joint pain   . Leg swelling   . Mass of colon   . Nasal congestion   . Nausea   . Pneumonia    walking about 6-81yrs ago  . PONV (postoperative nausea and vomiting)    pt states she is very easy to sedate  . S/P radiation therapy 03/11/2015 through  04/25/2015    Right breast 4500 cGy in 25 sessions, right breast boost 1600 cGy in 8 sessions   . Thyroid disease   . Tuberculosis    as little girl    . Vertigo    takes Meclizine prn  . Wears glasses   . Wears glasses     Past Surgical History:  Procedure Laterality Date  . ABDOMINAL HYSTERECTOMY    . APPENDECTOMY    . CARPAL TUNNEL RELEASE    . [place]  . COLONOSCOPY    . ESOPHAGOGASTRODUODENOSCOPY    . EXPLORATORY LAPAROTOMY    . KNEE SURGERY  1998   right - arthroscopic  . PARTIAL COLECTOMY  03/30/2012   Procedure: PARTIAL COLECTOMY;  Surgeon: [name], MD;  Location: [place];  Service: General;  Laterality: N/A;  . PORT-A-CATH REMOVAL N/A 06/25/2015   Procedure: REMOVAL PORT-A-CATH;  Surgeon: [name], MD;  Location: [place];  Service: General;  Laterality: N/A;  . PORTACATH PLACEMENT    . RADIOACTIVE SEED GUIDED MASTECTOMY WITH AXILLARY SENTINEL LYMPH NODE BIOPSY Right 10/17/2014   Procedure: RADIOACTIVE  SEED GUIDED PARTIAL MASTECTOMY WITH AXILLARY SENTINEL LYMPH NODE BIOPSY;  Surgeon: [name], MD;  Location: [place];  Service: General;  Laterality: Right;  . TOTAL SHOULDER ARTHROPLASTY Left 03/04/2016   Procedure: LEFT TOTAL SHOULDER ARTHROPLASTY;  Surgeon: [name], MD;  Location: [place];  Service: Orthopedics;  Laterality: Left;    There were no vitals filed for this visit.      Subjective Assessment - 04/22/16 1133    Subjective I drove a little yesterday around my neighborhood because my niece, who's been driving me, is going out of town this weekend. I was sore after even though I didn't use that arm I could myself tensing up.   Pertinent History Colon cancer 2013 with resection.  Breast cancer on right 2016 with chemo (finished July 2016) and radiation (finished early September 2016).  HTN (in flux--pt. will talk to PCP about that).  Has a RTC  tear in left shoulder and two cysts there, diagnosed by orthopedist.  Blood clot in neck spring 2016.  Had an UE  Doppler when the left arm swelling started and they found no evidence of blood clot for this episode.  Uses a cane for gait because of knee and ankle arthritis bilat. Orthopedist had her go ahead with rehab with [place] and wants to reassess in six weeks.   Currently in Pain? Yes   Pain Score 4    Pain Location Shoulder   Pain Orientation Left   Pain Descriptors / Indicators Aching   Pain Type Surgical pain   Pain Onset More than a month ago   Pain Frequency Intermittent   Aggravating Factors  getting dressed/undressed and a little sore from driving around neighborhood yesterday   Pain Relieving Factors rest and pain meds                         [facility] Adult PT Treatment/Exercise - 04/22/16 0001      Shoulder Exercises: Pulleys   Flexion 2 minutes     Shoulder Exercises: ROM/Strengthening   Other ROM/Strengthening Exercises UE Ranger with Lt UE for shoulder flexion, scaption and scaption into horizontal abduction (7 times here) 10 times in each position with ranger having UE ~45 degrees in sitting and with tactile and VC for decrease scapular compensation     Shoulder Exercises: Isometric Strengthening   Flexion Other (comment)  10x10" against small football on wall   Extension Other (comment)  10x10" against small football on wall   External Rotation Other (comment)  10x10" against small football on wall     Manual Therapy   Manual Therapy Passive ROM;Other (comment)   Passive ROM To Lt shoulder per new phase of protocol (Phase 2-Active Motion Phase weeks 6-12) flexion (in supine with towel rolls under elbow), er and IR in scapular plane to tolerance (in sitting after pulleys), though pt did not tolerate much today as pt was having a "catch" at about 45 degrees after tolerating ~110 degrees 3 times which wasn't painful.   Other Manual Therapy soft tissue for  pain relief and relaxation at left anterior shoulder and at incision to pts tolerance                   Short Term Clinic Goals - 04/22/16 1210      CC Short Term Goal  #1   Title Pt to demonstrate 40 degrees of PROM left shoulder ER in scapular plane 30 degrees  Baseline 26 degrees 04/12/16 (protocol no further than 30 degrees) 04/14/16; ~40 degrees with P/ROM today 04/22/16   Status Achieved     CC Short Term Goal  #2   Title Pt to demonstate 90 degrees of flexion during AAROM exercises   Baseline Able to do this in supine-04/07/16; able to do this with pulleys and can tolerate during P/ROM 04/22/16   Status Achieved     CC Short Term Goal  #3   Title Pt to demonstrate 30 degrees of PROM IR in scapular plane 45 degrees to side   Baseline unable to measure this yet per protocol 04/14/16   Status On-going             Long Term Clinic Goals - 04/01/16 1215      CC Long Term Goal  #1   Title Pt will demonstrate 160 degrees of flexion in prone   Time 8   Period Weeks   Status New     CC Long Term Goal  #2   Title Pt will demonstrate 75 degrees of external rotation in 90 degrees of abduction in prone   Time 8   Period Weeks   Status New     CC Long Term Goal  #3   Title Pt will demonstrate 60 degrees of internal rotation at 90 degrees of abduction   Time 8   Period Weeks   Status New     CC Long Term Goal  #4   Title Pt will be independent in a home exercise program for gentle stretching and strengthening per protocol   Time 8   Period Weeks   Status New            Plan - 04/22/16 1203    Clinical Impression Statement Pt overall did well today but did have increased soreness with P/ROM towards end of session so stopped at that point. She did demonstrated ~110 degrees of P/ROM flexion before soreness began to increase though and pt was relaxing better today than she has been.    Rehab Potential Good   Clinical Impairments Affecting Rehab Potential at home  social situation   PT Frequency 3x / week   PT Duration 8 weeks   PT Treatment/Interventions Manual techniques;Passive range of motion;Therapeutic exercise   PT Next Visit Plan Continue AA/P/ROM and isometric strengthening per protocol, e-stim to left shoulder if pt wants to try again    PT Home Exercise Plan AA/ROM for left shoulder flexion and abduction to prescribed limits.   Consulted and Agree with Plan of Care Patient      Patient will benefit from skilled therapeutic intervention in order to improve the following deficits and impairments:  Increased edema, Decreased knowledge of precautions, Decreased range of motion, Decreased strength, Impaired UE functional use, Increased fascial restricitons, Pain  Visit Diagnosis: Stiffness of left shoulder, not elsewhere classified  Pain in left shoulder  Localized edema  Localized swelling, mass, or lump of upper extremity, left  Pain In Left Arm     Problem List Patient Active Problem List   Diagnosis Date Noted  . S/P shoulder replacement 03/04/2016  . Constipation 12/16/2014  . Chemotherapy-induced nausea 12/16/2014  . Hyperglycemia 12/09/2014  . 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 Leukocytosis   . DVT (deep venous thrombosis) ([place]) 12/02/2014  . Osteoarthritis of both feet 11/26/2014  . Poor venous access   . Colorectal cancer ([place]) 10/03/2014  . Breast cancer of lower-inner quadrant of right female  breast ([place]) 09/27/2014  . Severe obesity (BMI >= 40) ([place]) 11/06/2013  . Anemia, iron deficiency 04/25/2013  . GERD (gastroesophageal reflux disease) 02/12/2013  . HTN (hypertension) 10/21/2011  . Hypothyroidism 10/21/2011  . Depression 10/21/2011  . Hyperlipidemia 10/21/2011  . Lung nodules 10/21/2011  . Vertigo 10/21/2011  . Vitamin D deficiency 10/21/2011    Otelia Limes, PTA 04/22/2016, 12:13 PM  [place],  [place], [address] Phone: [phone]   Fax:  [phone]  Name: Jordan [name] MRN: [id number] Date of Birth: [birth year]

## 2016-04-23 ENCOUNTER — Encounter: Payer: Medicare Other | Admitting: Physical Therapy

## 2016-04-23 DIAGNOSIS — Z96612 Presence of left artificial shoulder joint: Secondary | ICD-10-CM | POA: Diagnosis not present

## 2016-04-23 DIAGNOSIS — Z471 Aftercare following joint replacement surgery: Secondary | ICD-10-CM | POA: Diagnosis not present

## 2016-04-26 ENCOUNTER — Ambulatory Visit: Payer: Medicare Other

## 2016-04-26 DIAGNOSIS — R2232 Localized swelling, mass and lump, left upper limb: Secondary | ICD-10-CM | POA: Diagnosis not present

## 2016-04-26 DIAGNOSIS — M25612 Stiffness of left shoulder, not elsewhere classified: Secondary | ICD-10-CM | POA: Diagnosis not present

## 2016-04-26 DIAGNOSIS — M25512 Pain in left shoulder: Secondary | ICD-10-CM | POA: Diagnosis not present

## 2016-04-26 DIAGNOSIS — R600 Localized edema: Secondary | ICD-10-CM

## 2016-04-26 DIAGNOSIS — M79602 Pain in left arm: Secondary | ICD-10-CM | POA: Diagnosis not present

## 2016-04-26 NOTE — Therapy (Signed)
[place], [place], [address] Phone: [phone]   Fax:  [phone]  Physical Therapy Treatment  Patient Details  Name: Jordan [name] MRN: [id number] Date of Birth: 01-24-1951 Referring Provider: [name]  Encounter Date: 04/26/2016      PT End of Session - 04/26/16 1336    Visit Number 8   Number of Visits 22   Date for PT Re-Evaluation 05/27/16   PT Start Time 1319  Pt arrived late thinking her appt was at the [facility].   PT Stop Time 1345   PT Time Calculation (min) 26 min   Activity Tolerance Patient tolerated treatment well   Behavior During Therapy WFL for tasks assessed/performed      Past Medical History:  Diagnosis Date  . Anemia   . Arthritis    oa left shoulder  . Back pain    buldging disc  . Blood clot of neck vein   . Breast cancer ([place]) 10/17/2014   lower inner quadrant of the right breast  . Cancer ([place])    colon  . Constipation   . Depression    takes Wellbutrin daily  . Dizziness    r/t side effects from meds  . Family history of adverse reaction to anesthesia    It is hard for my mother to wake up from anesthesia.  . Generalized headaches    due to allergies, sinus  . GERD (gastroesophageal reflux disease)    takes Protonix as needed  . Hemorrhoids   . History of bronchitis    last time about 6-74yrs ago  . History of colon polyps   . History of hiatal hernia   . History of migraine    last one about 15+yrs ago  . History of UTI   . Hx of seasonal allergies    takes OTC allergy med nightly  . Hyperlipidemia    takes Zetia and Zocor daily  . Hypertension    takes Maxzide and Metoprolol daily  . Hypothyroidism    takes Synthroid daily  . Joint pain   . Leg swelling   . Mass of colon   . Nasal congestion   . Nausea   . Pneumonia    walking about 6-82yrs ago  . PONV (postoperative nausea and vomiting)    pt states she is very easy to sedate   . S/P radiation therapy 03/11/2015 through 04/25/2015    Right breast 4500 cGy in 25 sessions, right breast boost 1600 cGy in 8 sessions   . Thyroid disease   . Tuberculosis    as little girl    . Vertigo    takes Meclizine prn  . Wears glasses   . Wears glasses     Past Surgical History:  Procedure Laterality Date  . ABDOMINAL HYSTERECTOMY    . APPENDECTOMY    . CARPAL TUNNEL RELEASE    . [place]  . COLONOSCOPY    . ESOPHAGOGASTRODUODENOSCOPY    . EXPLORATORY LAPAROTOMY    . KNEE SURGERY  1998   right - arthroscopic  . PARTIAL COLECTOMY  03/30/2012   Procedure: PARTIAL COLECTOMY;  Surgeon: [name], MD;  Location: [facility];  Service: General;  Laterality: N/A;  . PORT-A-CATH REMOVAL N/A 06/25/2015   Procedure: REMOVAL PORT-A-CATH;  Surgeon: [name], MD;  Location: [facility];  Service: General;  Laterality: N/A;  . PORTACATH PLACEMENT    . RADIOACTIVE SEED GUIDED MASTECTOMY  WITH AXILLARY SENTINEL LYMPH NODE BIOPSY Right 10/17/2014   Procedure: RADIOACTIVE SEED GUIDED PARTIAL MASTECTOMY WITH AXILLARY SENTINEL LYMPH NODE BIOPSY;  Surgeon: [name], MD;  Location: [facility];  Service: General;  Laterality: Right;  . TOTAL SHOULDER ARTHROPLASTY Left 03/04/2016   Procedure: LEFT TOTAL SHOULDER ARTHROPLASTY;  Surgeon: [name], MD;  Location: [facility];  Service: Orthopedics;  Laterality: Left;    There were no vitals filed for this visit.      Subjective Assessment - 04/26/16 1323    Subjective I saw the PA Friday and she asked why I was still wearing the sling and I told her because I hadn't been told otherwise. So I've been trying to go without it but it's still hard to not sleep in it. Just having a little pain today. My granddaughter left for [place] today and that was really hard for me.    Pertinent History Colon cancer 2013 with resection.  Breast cancer on  right 2016 with chemo (finished July 2016) and radiation (finished early September 2016).  HTN (in flux--pt. will talk to PCP about that).  Has a RTC tear in left shoulder and two cysts there, diagnosed by orthopedist.  Blood clot in neck spring 2016.  Had an UE  Doppler when the left arm swelling started and they found no evidence of blood clot for this episode.  Uses a cane for gait because of knee and ankle arthritis bilat. Orthopedist had her go ahead with rehab with [place] and wants to reassess in six weeks.   Currently in Pain? Yes   Pain Score 3    Pain Location Shoulder   Pain Orientation Left   Pain Descriptors / Indicators Aching   Pain Onset More than a month ago   Pain Frequency Intermittent   Aggravating Factors  still need to bend over to get into my shirt because it hurts so much and cooked yesterday which really flared me up   Pain Relieving Factors rest and pain meds                         [facility] Adult PT Treatment/Exercise - 04/26/16 0001      Shoulder Exercises: Pulleys   Flexion 3 minutes   Flexion Limitations With VC to decrease scapular compensation     Shoulder Exercises: ROM/Strengthening   Other ROM/Strengthening Exercises UE Ranger with Lt UE for shoulder flexion, scaption and scaption into horizontal abduction (7 times here) 10 times in each position with ranger having UE ~45 degrees in sitting and with tactile and VC for decrease scapular compensation     Shoulder Exercises: Isometric Strengthening   Flexion Other (comment)  10 x 5" holds against small ball on wall   Extension Other (comment)  10 x 5" holds against small ball on wall   External Rotation Other (comment)  10 x 5" holds against small ball on wall     Manual Therapy   Manual Therapy Passive ROM   Passive ROM To Lt shoulder per new phase of protocol (Phase 2-Active Motion Phase weeks 6-12) flexion (in supine with towel rolls under elbow), er and IR in scapular plane to tolerance.                    Short Term Clinic Goals - 04/22/16 1210      CC Short Term Goal  #1   Title Pt to demonstrate 40 degrees of PROM left  shoulder ER in scapular plane 30 degrees   Baseline 26 degrees 04/12/16 (protocol no further than 30 degrees) 04/14/16; ~40 degrees with P/ROM today 04/22/16   Status Achieved     CC Short Term Goal  #2   Title Pt to demonstate 90 degrees of flexion during AAROM exercises   Baseline Able to do this in supine-04/07/16; able to do this with pulleys and can tolerate during P/ROM 04/22/16   Status Achieved     CC Short Term Goal  #3   Title Pt to demonstrate 30 degrees of PROM IR in scapular plane 45 degrees to side   Baseline unable to measure this yet per protocol 04/14/16   Status On-going             Long Term Clinic Goals - 04/01/16 1215      CC Long Term Goal  #1   Title Pt will demonstrate 160 degrees of flexion in prone   Time 8   Period Weeks   Status New     CC Long Term Goal  #2   Title Pt will demonstrate 75 degrees of external rotation in 90 degrees of abduction in prone   Time 8   Period Weeks   Status New     CC Long Term Goal  #3   Title Pt will demonstrate 60 degrees of internal rotation at 90 degrees of abduction   Time 8   Period Weeks   Status New     CC Long Term Goal  #4   Title Pt will be independent in a home exercise program for gentle stretching and strengthening per protocol   Time 8   Period Weeks   Status New            Plan - 04/26/16 1339    Clinical Impression Statement Pt arrived late today so abbreviated treatment. Pt did seem to be a little looser with her AA/ROM on pulleys and she reported this feeling a little better as well.    Rehab Potential Good   Clinical Impairments Affecting Rehab Potential at home social situation   PT Frequency 3x / week   PT Duration 8 weeks   PT Treatment/Interventions Manual techniques;Passive range of motion;Therapeutic exercise   PT Next Visit Plan  Continue AA/P/ROM and isometric strengthening per protocol, e-stim to left shoulder if pt wants to try again    Consulted and Agree with Plan of Care Patient      Patient will benefit from skilled therapeutic intervention in order to improve the following deficits and impairments:  Increased edema, Decreased knowledge of precautions, Decreased range of motion, Decreased strength, Impaired UE functional use, Increased fascial restricitons, Pain  Visit Diagnosis: Stiffness of left shoulder, not elsewhere classified  Pain in left shoulder  Localized edema     Problem List Patient Active Problem List   Diagnosis Date Noted  . S/P shoulder replacement 03/04/2016  . Constipation 12/16/2014  . Chemotherapy-induced nausea 12/16/2014  . Hyperglycemia 12/09/2014  . 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 Leukocytosis   . DVT (deep venous thrombosis) ([place]) 12/02/2014  . Osteoarthritis of both feet 11/26/2014  . Poor venous access   . Colorectal cancer ([place]) 10/03/2014  . Breast cancer of lower-inner quadrant of right female breast ([place]) 09/27/2014  . Severe obesity (BMI >= 40) ([place]) 11/06/2013  . Anemia, iron deficiency 04/25/2013  . GERD (gastroesophageal reflux disease) 02/12/2013  . HTN (hypertension) 10/21/2011  . Hypothyroidism 10/21/2011  . Depression  10/21/2011  . Hyperlipidemia 10/21/2011  . Lung nodules 10/21/2011  . Vertigo 10/21/2011  . Vitamin D deficiency 10/21/2011    Jordan [name], PTA 04/26/2016, 1:45 PM  [place], [place], [address] Phone: [phone]   Fax:  [phone]  Name: Jordan [name] MRN: [id number] Date of Birth: 1950-09-16

## 2016-04-28 ENCOUNTER — Ambulatory Visit: Payer: Medicare Other

## 2016-04-28 DIAGNOSIS — M25512 Pain in left shoulder: Secondary | ICD-10-CM

## 2016-04-28 DIAGNOSIS — R2232 Localized swelling, mass and lump, left upper limb: Secondary | ICD-10-CM | POA: Diagnosis not present

## 2016-04-28 DIAGNOSIS — M25612 Stiffness of left shoulder, not elsewhere classified: Secondary | ICD-10-CM

## 2016-04-28 DIAGNOSIS — M79602 Pain in left arm: Secondary | ICD-10-CM | POA: Diagnosis not present

## 2016-04-28 DIAGNOSIS — R600 Localized edema: Secondary | ICD-10-CM | POA: Diagnosis not present

## 2016-04-28 NOTE — Therapy (Signed)
[place], [place], [address] Phone: [phone]   Fax:  [phone]  Physical Therapy Treatment  Patient Details  Name: Jordan [name] MRN: [id number] Date of Birth: 09/25/1950 Referring Provider: [name]  Encounter Date: 04/28/2016      PT End of Session - 04/28/16 1343    Visit Number 9   Number of Visits 22   Date for PT Re-Evaluation 05/27/16   PT Start Time 1302   PT Stop Time 1345   PT Time Calculation (min) 43 min   Activity Tolerance Patient tolerated treatment well;Patient limited by pain   Behavior During Therapy [facility] for tasks assessed/performed      Past Medical History:  Diagnosis Date  . Anemia   . Arthritis    oa left shoulder  . Back pain    buldging disc  . Blood clot of neck vein   . Breast cancer ([place]) 10/17/2014   lower inner quadrant of the right breast  . Cancer ([place])    colon  . Constipation   . Depression    takes Wellbutrin daily  . Dizziness    r/t side effects from meds  . Family history of adverse reaction to anesthesia    It is hard for my mother to wake up from anesthesia.  . Generalized headaches    due to allergies, sinus  . GERD (gastroesophageal reflux disease)    takes Protonix as needed  . Hemorrhoids   . History of bronchitis    last time about 6-40yrs ago  . History of colon polyps   . History of hiatal hernia   . History of migraine    last one about 15+yrs ago  . History of UTI   . Hx of seasonal allergies    takes OTC allergy med nightly  . Hyperlipidemia    takes Zetia and Zocor daily  . Hypertension    takes Maxzide and Metoprolol daily  . Hypothyroidism    takes Synthroid daily  . Joint pain   . Leg swelling   . Mass of colon   . Nasal congestion   . Nausea   . Pneumonia    walking about 6-57yrs ago  . PONV (postoperative nausea and vomiting)    pt states she is very easy to sedate  . S/P radiation therapy 03/11/2015  through 04/25/2015    Right breast 4500 cGy in 25 sessions, right breast boost 1600 cGy in 8 sessions   . Thyroid disease   . Tuberculosis    as little girl    . Vertigo    takes Meclizine prn  . Wears glasses   . Wears glasses     Past Surgical History:  Procedure Laterality Date  . ABDOMINAL HYSTERECTOMY    . APPENDECTOMY    . CARPAL TUNNEL RELEASE    . [place]  . COLONOSCOPY    . ESOPHAGOGASTRODUODENOSCOPY    . EXPLORATORY LAPAROTOMY    . KNEE SURGERY  1998   right - arthroscopic  . PARTIAL COLECTOMY  03/30/2012   Procedure: PARTIAL COLECTOMY;  Surgeon: [name], MD;  Location: [facility];  Service: General;  Laterality: N/A;  . PORT-A-CATH REMOVAL N/A 06/25/2015   Procedure: REMOVAL PORT-A-CATH;  Surgeon: [name], MD;  Location: [facility];  Service: General;  Laterality: N/A;  . PORTACATH PLACEMENT    . RADIOACTIVE SEED GUIDED MASTECTOMY WITH AXILLARY SENTINEL LYMPH NODE BIOPSY Right 10/17/2014  Procedure: RADIOACTIVE SEED GUIDED PARTIAL MASTECTOMY WITH AXILLARY SENTINEL LYMPH NODE BIOPSY;  Surgeon: [name], MD;  Location: [facility];  Service: General;  Laterality: Right;  . TOTAL SHOULDER ARTHROPLASTY Left 03/04/2016   Procedure: LEFT TOTAL SHOULDER ARTHROPLASTY;  Surgeon: [name], MD;  Location: [facility];  Service: Orthopedics;  Laterality: Left;    There were no vitals filed for this visit.      Subjective Assessment - 04/28/16 1307    Subjective My Lt shoulder just feels so achy today.    Pertinent History Colon cancer 2013 with resection.  Breast cancer on right 2016 with chemo (finished July 2016) and radiation (finished early September 2016).  HTN (in flux--pt. will talk to PCP about that).  Has a RTC tear in left shoulder and two cysts there, diagnosed by orthopedist.  Blood clot in neck spring 2016.  Had an UE  Doppler when the left arm swelling started  and they found no evidence of blood clot for this episode.  Uses a cane for gait because of knee and ankle arthritis bilat. Orthopedist had her go ahead with rehab with [place] and wants to reassess in six weeks.   Currently in Pain? Yes   Pain Score 5    Pain Location Shoulder   Pain Orientation Left   Pain Descriptors / Indicators Aching   Pain Type Surgical pain   Pain Onset More than a month ago   Pain Frequency Intermittent   Aggravating Factors  getting dressed/undressed and reaching too high   Pain Relieving Factors rest and pain meds            [facility] PT Assessment - 04/28/16 0001      PROM   Left Shoulder Internal Rotation 60 Degrees   Left Shoulder External Rotation 40 Degrees                     [facility] Adult PT Treatment/Exercise - 04/28/16 0001      Shoulder Exercises: Pulleys   Flexion 3 minutes   Flexion Limitations Tactile cuing to decrease scapular compensations and have UE in correct position.     Shoulder Exercises: ROM/Strengthening   Other ROM/Strengthening Exercises UE Ranger with Lt UE for shoulder flexion, scaption and scaption into horizontal abduction (10 times here) 20 times in each position with ranger having UE ~45 degrees in sitting and with tactile and VC for decrease scapular compensation     Shoulder Exercises: Isometric Strengthening   Flexion Other (comment)  10 x 5" holds against small ball on wall, Lt UE   Extension Other (comment)  10x5" holds against small ball on wall, Lt UE   External Rotation Other (comment)  10x5" holds against small ball on wall; Lt UE     Manual Therapy   Manual Therapy Passive ROM   Passive ROM To Lt shoulder per new phase of protocol (Phase 2-Active Motion Phase weeks 6-12) flexion (in supine with towel rolls under elbow), er and IR in scapular plane to tolerance.   Other Manual Therapy soft tissue for pain relief and relaxation at left anterior shoulder and at incision to pts tolerance                    Short Term Clinic Goals - 04/28/16 1335      CC Short Term Goal  #1   Title Pt to demonstrate 40 degrees of PROM left shoulder ER in scapular plane 30 degrees  Status Achieved     CC Short Term Goal  #2   Title Pt to demonstate 90 degrees of flexion during AAROM exercises   Status Achieved     CC Short Term Goal  #3   Title Pt to demonstrate 30 degrees of PROM IR in scapular plane 45 degrees to side   Baseline unable to measure this yet per protocol 04/14/16; 60 degrees 04/28/16   Status Achieved             Long Term Clinic Goals - 04/01/16 1215      CC Long Term Goal  #1   Title Pt will demonstrate 160 degrees of flexion in prone   Time 8   Period Weeks   Status New     CC Long Term Goal  #2   Title Pt will demonstrate 75 degrees of external rotation in 90 degrees of abduction in prone   Time 8   Period Weeks   Status New     CC Long Term Goal  #3   Title Pt will demonstrate 60 degrees of internal rotation at 90 degrees of abduction   Time 8   Period Weeks   Status New     CC Long Term Goal  #4   Title Pt will be independent in a home exercise program for gentle stretching and strengthening per protocol   Time 8   Period Weeks   Status New            Plan - 04/28/16 1343    Clinical Impression Statement Pt overall tolerated treament well but unable to progress horizontal abduction much with UE ranger due to increased pain. Also pt started P/ROM feeling good but then she started having "sticking, pin pain" inferior to incision causing her to be unable to relax so stopped early and finished P/ROM earlier than usual. Despite pts increased pain today, her P/ROM has improved meeting all STGs.   Rehab Potential Good   Clinical Impairments Affecting Rehab Potential at home social situation   PT Frequency 3x / week   PT Duration 8 weeks   PT Treatment/Interventions Manual techniques;Passive range of motion;Therapeutic exercise   PT  Next Visit Plan Continue AA/P/ROM and isometric strengthening per protocol, e-stim to left shoulder if pt wants to try again    Consulted and Agree with Plan of Care Patient      Patient will benefit from skilled therapeutic intervention in order to improve the following deficits and impairments:  Increased edema, Decreased knowledge of precautions, Decreased range of motion, Decreased strength, Impaired UE functional use, Increased fascial restricitons, Pain  Visit Diagnosis: Stiffness of left shoulder, not elsewhere classified  Pain in left shoulder  Localized edema     Problem List Patient Active Problem List   Diagnosis Date Noted  . S/P shoulder replacement 03/04/2016  . Constipation 12/16/2014  . Chemotherapy-induced nausea 12/16/2014  . Hyperglycemia 12/09/2014  . Internal jugular vein thrombosis ([facility])   . Leukocytosis   . DVT (deep venous thrombosis) ([facility]) 12/02/2014  . Osteoarthritis of both feet 11/26/2014  . Poor venous access   . Colorectal cancer ([facility]) 10/03/2014  . Breast cancer of lower-inner quadrant of right female breast ([facility]) 09/27/2014  . Severe obesity (BMI >= 40) ([facility]) 11/06/2013  . Anemia, iron deficiency 04/25/2013  . GERD (gastroesophageal reflux disease) 02/12/2013  . HTN (hypertension) 10/21/2011  . Hypothyroidism 10/21/2011  . Depression 10/21/2011  . Hyperlipidemia 10/21/2011  . Lung nodules 10/21/2011  . Vertigo  10/21/2011  . Vitamin D deficiency 10/21/2011    Otelia Limes, PTA 04/28/2016, 1:56 PM  [place], [place], [address] Phone: [phone]   Fax:  [phone]  Name: Jordan [name] MRN: [id number] Date of Birth: Apr 12, 1951

## 2016-05-07 ENCOUNTER — Other Ambulatory Visit: Payer: Self-pay | Admitting: *Deleted

## 2016-05-07 DIAGNOSIS — C50311 Malignant neoplasm of lower-inner quadrant of right female breast: Secondary | ICD-10-CM
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
## 2016-05-10 ENCOUNTER — Ambulatory Visit (HOSPITAL_BASED_OUTPATIENT_CLINIC_OR_DEPARTMENT_OTHER): Payer: Medicare Other | Admitting: Oncology

## 2016-05-10 ENCOUNTER — Other Ambulatory Visit (HOSPITAL_BASED_OUTPATIENT_CLINIC_OR_DEPARTMENT_OTHER): Payer: Medicare Other

## 2016-05-10 VITALS — BP 114/75 | HR 90 | Temp 98.1°F | Resp 18 | Ht 61.5 in | Wt 235.1 lb

## 2016-05-10 DIAGNOSIS — C50311 Malignant neoplasm of lower-inner quadrant of right female breast: Secondary | ICD-10-CM

## 2016-05-10 DIAGNOSIS — Z853 Personal history of malignant neoplasm of breast: Secondary | ICD-10-CM

## 2016-05-10 DIAGNOSIS — Z86718 Personal history of other venous thrombosis and embolism: Secondary | ICD-10-CM

## 2016-05-10 LAB — CBC WITH DIFFERENTIAL/PLATELET
BASO%: 0.7 % (ref 0.0–2.0)
BASOS ABS: 0.1 10*3/uL (ref 0.0–0.1)
EOS ABS: 0.4 10*3/uL (ref 0.0–0.5)
EOS%: 4.7 % (ref 0.0–7.0)
HCT: 45.7 % (ref 34.8–46.6)
HGB: 14.4 g/dL (ref 11.6–15.9)
LYMPH%: 29.1 % (ref 14.0–49.7)
MCH: 27.5 pg (ref 25.1–34.0)
MCHC: 31.5 g/dL (ref 31.5–36.0)
MCV: 87.3 fL (ref 79.5–101.0)
MONO#: 0.6 10*3/uL (ref 0.1–0.9)
MONO%: 7.1 % (ref 0.0–14.0)
NEUT#: 4.8 10*3/uL (ref 1.5–6.5)
NEUT%: 58.4 % (ref 38.4–76.8)
PLATELETS: ADEQUATE 10*3/uL (ref 145–400)
RBC: 5.24 10*6/uL (ref 3.70–5.45)
RDW: 15.5 % — ABNORMAL HIGH (ref 11.2–14.5)
WBC: 8.3 10*3/uL (ref 3.9–10.3)
lymph#: 2.4 10*3/uL (ref 0.9–3.3)

## 2016-05-10 LAB — COMPREHENSIVE METABOLIC PANEL
ALK PHOS: 73 U/L (ref 40–150)
ALT: 25 U/L (ref 0–55)
ANION GAP: 10 meq/L (ref 3–11)
AST: 22 U/L (ref 5–34)
Albumin: 3.2 g/dL — ABNORMAL LOW (ref 3.5–5.0)
BILIRUBIN TOTAL: 0.75 mg/dL (ref 0.20–1.20)
BUN: 13 mg/dL (ref 7.0–26.0)
CO2: 23 meq/L (ref 22–29)
Calcium: 8.9 mg/dL (ref 8.4–10.4)
Chloride: 109 mEq/L (ref 98–109)
Creatinine: 0.8 mg/dL (ref 0.6–1.1)
EGFR: 83 mL/min/{1.73_m2} — AB (ref >90)
Glucose: 91 mg/dl (ref 70–140)
POTASSIUM: 3.9 meq/L (ref 3.5–5.1)
Sodium: 142 mEq/L (ref 136–145)
TOTAL PROTEIN: 6.6 g/dL (ref 6.4–8.3)

## 2016-05-11 ENCOUNTER — Ambulatory Visit: Payer: Medicare Other

## 2016-05-11 DIAGNOSIS — M25512 Pain in left shoulder: Secondary | ICD-10-CM | POA: Diagnosis not present

## 2016-05-11 DIAGNOSIS — M79602 Pain in left arm: Secondary | ICD-10-CM | POA: Diagnosis not present

## 2016-05-11 DIAGNOSIS — M25612 Stiffness of left shoulder, not elsewhere classified: Secondary | ICD-10-CM | POA: Diagnosis not present

## 2016-05-11 DIAGNOSIS — R600 Localized edema: Secondary | ICD-10-CM | POA: Diagnosis not present

## 2016-05-11 DIAGNOSIS — R2232 Localized swelling, mass and lump, left upper limb: Secondary | ICD-10-CM | POA: Diagnosis not present

## 2016-05-11 NOTE — Therapy (Signed)
[place], [place], [address] Phone: [phone]   Fax:  [phone]  Physical Therapy Treatment  Patient Details  Name: Jordan [name] MRN: [id number] Date of Birth: September 08, 1950 Referring Provider: [name]  Encounter Date: 05/11/2016      PT End of Session - 05/11/16 1207    Visit Number 10   Number of Visits 22   Date for PT Re-Evaluation 05/27/16   PT Start Time 1110   PT Stop Time 1200   PT Time Calculation (min) 50 min   Activity Tolerance Patient tolerated treatment well;Patient limited by pain   Behavior During Therapy [facility] for tasks assessed/performed      Past Medical History:  Diagnosis Date  . Anemia   . Arthritis    oa left shoulder  . Back pain    buldging disc  . Blood clot of neck vein   . Breast cancer ([place]) 10/17/2014   lower inner quadrant of the right breast  . Cancer ([place])    colon  . Constipation   . Depression    takes Wellbutrin daily  . Dizziness    r/t side effects from meds  . Family history of adverse reaction to anesthesia    It is hard for my mother to wake up from anesthesia.  . Generalized headaches    due to allergies, sinus  . GERD (gastroesophageal reflux disease)    takes Protonix as needed  . Hemorrhoids   . History of bronchitis    last time about 6-67yrs ago  . History of colon polyps   . History of hiatal hernia   . History of migraine    last one about 15+yrs ago  . History of UTI   . Hx of seasonal allergies    takes OTC allergy med nightly  . Hyperlipidemia    takes Zetia and Zocor daily  . Hypertension    takes Maxzide and Metoprolol daily  . Hypothyroidism    takes Synthroid daily  . Joint pain   . Leg swelling   . Mass of colon   . Nasal congestion   . Nausea   . Pneumonia    walking about 6-3yrs ago  . PONV (postoperative nausea and vomiting)    pt states she is very easy to sedate  . S/P radiation therapy 03/11/2015  through 04/25/2015    Right breast 4500 cGy in 25 sessions, right breast boost 1600 cGy in 8 sessions   . Thyroid disease   . Tuberculosis    as little girl    . Vertigo    takes Meclizine prn  . Wears glasses   . Wears glasses     Past Surgical History:  Procedure Laterality Date  . ABDOMINAL HYSTERECTOMY    . APPENDECTOMY    . CARPAL TUNNEL RELEASE    . [place]  . COLONOSCOPY    . ESOPHAGOGASTRODUODENOSCOPY    . EXPLORATORY LAPAROTOMY    . KNEE SURGERY  1998   right - arthroscopic  . PARTIAL COLECTOMY  03/30/2012   Procedure: PARTIAL COLECTOMY;  Surgeon: [name], MD;  Location: [place];  Service: General;  Laterality: N/A;  . PORT-A-CATH REMOVAL N/A 06/25/2015   Procedure: REMOVAL PORT-A-CATH;  Surgeon: [name], MD;  Location: [place];  Service: General;  Laterality: N/A;  . PORTACATH PLACEMENT    . RADIOACTIVE SEED GUIDED MASTECTOMY WITH AXILLARY SENTINEL LYMPH NODE BIOPSY Right 10/17/2014  Procedure: RADIOACTIVE SEED GUIDED PARTIAL MASTECTOMY WITH AXILLARY SENTINEL LYMPH NODE BIOPSY;  Surgeon: [name], MD;  Location: [place];  Service: General;  Laterality: Right;  . TOTAL SHOULDER ARTHROPLASTY Left 03/04/2016   Procedure: LEFT TOTAL SHOULDER ARTHROPLASTY;  Surgeon: [name], MD;  Location: [place];  Service: Orthopedics;  Laterality: Left;    There were no vitals filed for this visit.      Subjective Assessment - 05/11/16 1131    Subjective I didn't have my pain meds or my muscle relaxers because I'm pretty suremy husband took them. I told the doctor but since this was the second time they won't refill it. I hid them this time too and he still found them.    Pertinent History Colon cancer 2013 with resection.  Breast cancer on right 2016 with chemo (finished July 2016) and radiation (finished early September 2016).  HTN (in flux--pt. will talk to PCP  about that).  Has a RTC tear in left shoulder and two cysts there, diagnosed by orthopedist.  Blood clot in neck spring 2016.  Had an UE  Doppler when the left arm swelling started and they found no evidence of blood clot for this episode.  Uses a cane for gait because of knee and ankle arthritis bilat. Orthopedist had her go ahead with rehab with [place] and wants to reassess in six weeks.   Currently in Pain? Yes   Pain Score 3    Pain Location Shoulder   Pain Orientation Left   Pain Descriptors / Indicators Aching;Tightness   Pain Type Surgical pain   Pain Onset More than a month ago   Pain Frequency Intermittent   Aggravating Factors  didn't have pain meds or muscle relaxers last week   Pain Relieving Factors rest and pain meds                          [facility] Adult PT Treatment/Exercise - 05/11/16 0001      Shoulder Exercises: Pulleys   Flexion 3 minutes   Flexion Limitations Tactile cuing to decrease scapular compensations though pt required less cuing today.     Shoulder Exercises: ROM/Strengthening   Other ROM/Strengthening Exercises UE Ranger with Lt UE for shoulder flexion, scaption 20 times in each position with ranger having UE ~45 degrees in sitting and with tactile and VC for decrease scapular compensation     Electrical Stimulation   Electrical Stimulation Location Lt anterior shoulder/pectoralis and upper arm    Electrical Stimulation Action IFC to pts tolerance x 10 mins   Electrical Stimulation Parameters 80-150Hz    Electrical Stimulation Goals Pain     Manual Therapy   Manual Therapy Passive ROM   Passive ROM To Lt shoulder per new phase of protocol (Phase 2-Active Motion Phase weeks 6-12) flexion (in supine with towel rolls under elbow), er in scapular plane, pt unable to tolerate this for long today so we stopped after 5 mins.   Other Manual Therapy --                   Short Term Clinic Goals - 04/28/16 1335      CC Short Term Goal  #1    Title Pt to demonstrate 40 degrees of PROM left shoulder ER in scapular plane 30 degrees   Status Achieved     CC Short Term Goal  #2   Title Pt to demonstate 90 degrees of flexion during AAROM  exercises   Status Achieved     CC Short Term Goal  #3   Title Pt to demonstrate 30 degrees of PROM IR in scapular plane 45 degrees to side   Baseline unable to measure this yet per protocol 04/14/16; 60 degrees 04/28/16   Status Achieved             Long Term Clinic Goals - 04/01/16 1215      CC Long Term Goal  #1   Title Pt will demonstrate 160 degrees of flexion in prone   Time 8   Period Weeks   Status New     CC Long Term Goal  #2   Title Pt will demonstrate 75 degrees of external rotation in 90 degrees of abduction in prone   Time 8   Period Weeks   Status New     CC Long Term Goal  #3   Title Pt will demonstrate 60 degrees of internal rotation at 90 degrees of abduction   Time 8   Period Weeks   Status New     CC Long Term Goal  #4   Title Pt will be independent in a home exercise program for gentle stretching and strengthening per protocol   Time 8   Period Weeks   Status New            Plan - 05/11/16 1211    Clinical Impression Statement G code done this visit. Pt did well with AA/ROM exercises today not demonstrating painful guarding. She was having alot of pain with P/ROM though so unable to continue this for long. Pt wanted to try electrical stimulation again today as she hasn't had pain meds or muscle relaxers for over a week now(she reports thinking her husband took them and she hasn't let the prescribing doc know yet though she did tel her oncologist, advised pt to update doctor immediately). So she tolerated e-stim very well and reported feeling relaxed after session today.   Rehab Potential Good   Clinical Impairments Affecting Rehab Potential at home social situation   PT Frequency 3x / week   PT Duration 8 weeks   PT Treatment/Interventions Manual  techniques;Passive range of motion;Therapeutic exercise   PT Next Visit Plan Assess goals and measure ROM. Continue AA/P/ROM and isometric strengthening per protocol, cont e-stim to left shoulder if pt wants to try again. Begin KX next week as that will start 20 visits for the calendar year (including her last episode of care with [place]).   Recommended Other Services Week of Sept 25 is week 9 of protocol   Consulted and Agree with Plan of Care Patient      Patient will benefit from skilled therapeutic intervention in order to improve the following deficits and impairments:  Increased edema, Decreased knowledge of precautions, Decreased range of motion, Decreased strength, Impaired UE functional use, Increased fascial restricitons, Pain  Visit Diagnosis: Stiffness of left shoulder, not elsewhere classified  Pain in left shoulder  Localized edema     Problem List Patient Active Problem List   Diagnosis Date Noted  . S/P shoulder replacement 03/04/2016  . Constipation 12/16/2014  . Chemotherapy-induced nausea 12/16/2014  . Hyperglycemia 12/09/2014  . 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 Leukocytosis   . DVT (deep venous thrombosis) ([place]) 12/02/2014  . Osteoarthritis of both feet 11/26/2014  . Poor venous access   . Colorectal cancer ([place]) 10/03/2014  . Breast cancer of lower-inner quadrant of right female breast ([place]) 09/27/2014  .  Severe obesity (BMI >= 40) ([place]) 11/06/2013  . Anemia, iron deficiency 04/25/2013  . GERD (gastroesophageal reflux disease) 02/12/2013  . HTN (hypertension) 10/21/2011  . Hypothyroidism 10/21/2011  . Depression 10/21/2011  . Hyperlipidemia 10/21/2011  . Lung nodules 10/21/2011  . Vertigo 10/21/2011  . Vitamin D deficiency 10/21/2011    Otelia Limes, PTA 05/11/2016, 12:18 PM  [place], [place], [address] Phone: [phone]   Fax:   [phone]  Name: Jordan [name] MRN: [id number] Date of Birth: [birth year]

## 2016-05-13 ENCOUNTER — Ambulatory Visit: Payer: Medicare Other

## 2016-05-13 DIAGNOSIS — M25512 Pain in left shoulder: Secondary | ICD-10-CM | POA: Diagnosis not present

## 2016-05-13 DIAGNOSIS — M79602 Pain in left arm: Secondary | ICD-10-CM | POA: Diagnosis not present

## 2016-05-13 DIAGNOSIS — M25612 Stiffness of left shoulder, not elsewhere classified: Secondary | ICD-10-CM

## 2016-05-13 DIAGNOSIS — R600 Localized edema: Secondary | ICD-10-CM | POA: Diagnosis not present

## 2016-05-13 DIAGNOSIS — R2232 Localized swelling, mass and lump, left upper limb: Secondary | ICD-10-CM | POA: Diagnosis not present

## 2016-05-13 NOTE — Therapy (Signed)
[place], [place], [address] Phone: [phone]   Fax:  [phone]  Physical Therapy Treatment  Patient Details  Name: Jordan [name] MRN: [id number] Date of Birth: Aug 25, 1950 Referring Provider: [name]  Encounter Date: 05/13/2016      PT End of Session - 05/13/16 1158    Visit Number 11   Number of Visits 22   Date for PT Re-Evaluation 05/27/16   PT Start Time 1107   PT Stop Time 1202   PT Time Calculation (min) 55 min   Activity Tolerance Patient tolerated treatment well;Patient limited by pain   Behavior During Therapy [facility] for tasks assessed/performed      Past Medical History:  Diagnosis Date  . Anemia   . Arthritis    oa left shoulder  . Back pain    buldging disc  . Blood clot of neck vein   . Breast cancer ([place]) 10/17/2014   lower inner quadrant of the right breast  . Cancer ([place])    colon  . Constipation   . Depression    takes Wellbutrin daily  . Dizziness    r/t side effects from meds  . Family history of adverse reaction to anesthesia    It is hard for my mother to wake up from anesthesia.  . Generalized headaches    due to allergies, sinus  . GERD (gastroesophageal reflux disease)    takes Protonix as needed  . Hemorrhoids   . History of bronchitis    last time about 6-79yrs ago  . History of colon polyps   . History of hiatal hernia   . History of migraine    last one about 15+yrs ago  . History of UTI   . Hx of seasonal allergies    takes OTC allergy med nightly  . Hyperlipidemia    takes Zetia and Zocor daily  . Hypertension    takes Maxzide and Metoprolol daily  . Hypothyroidism    takes Synthroid daily  . Joint pain   . Leg swelling   . Mass of colon   . Nasal congestion   . Nausea   . Pneumonia    walking about 6-22yrs ago  . PONV (postoperative nausea and vomiting)    pt states she is very easy to sedate  . S/P radiation therapy 03/11/2015  through 04/25/2015    Right breast 4500 cGy in 25 sessions, right breast boost 1600 cGy in 8 sessions   . Thyroid disease   . Tuberculosis    as little girl    . Vertigo    takes Meclizine prn  . Wears glasses   . Wears glasses     Past Surgical History:  Procedure Laterality Date  . ABDOMINAL HYSTERECTOMY    . APPENDECTOMY    . CARPAL TUNNEL RELEASE    . [place]  . COLONOSCOPY    . ESOPHAGOGASTRODUODENOSCOPY    . EXPLORATORY LAPAROTOMY    . KNEE SURGERY  1998   right - arthroscopic  . PARTIAL COLECTOMY  03/30/2012   Procedure: PARTIAL COLECTOMY;  Surgeon: [name], MD;  Location: [place];  Service: General;  Laterality: N/A;  . PORT-A-CATH REMOVAL N/A 06/25/2015   Procedure: REMOVAL PORT-A-CATH;  Surgeon: [name], MD;  Location: [facility];  Service: General;  Laterality: N/A;  . PORTACATH PLACEMENT    . RADIOACTIVE SEED GUIDED MASTECTOMY WITH AXILLARY SENTINEL LYMPH NODE BIOPSY Right 10/17/2014  Procedure: RADIOACTIVE SEED GUIDED PARTIAL MASTECTOMY WITH AXILLARY SENTINEL LYMPH NODE BIOPSY;  Surgeon: [name], MD;  Location: [facility] [place];  Service: General;  Laterality: Right;  . TOTAL SHOULDER ARTHROPLASTY Left 03/04/2016   Procedure: LEFT TOTAL SHOULDER ARTHROPLASTY;  Surgeon: [name], MD;  Location: [place];  Service: Orthopedics;  Laterality: Left;    There were no vitals filed for this visit.      Subjective Assessment - 05/13/16 1140    Subjective My Lt shoulder felt good after last visit but it cramped up a little yesterday. It's eased off since then though.    Pertinent History Colon cancer 2013 with resection.  Breast cancer on right 2016 with chemo (finished July 2016) and radiation (finished early September 2016).  HTN (in flux--pt. will talk to PCP about that).  Has a RTC tear in left shoulder and two cysts there, diagnosed by orthopedist.  Blood clot in  neck spring 2016.  Had an UE  Doppler when the left arm swelling started and they found no evidence of blood clot for this episode.  Uses a cane for gait because of knee and ankle arthritis bilat. Orthopedist had her go ahead with rehab with [place] and wants to reassess in six weeks.   Currently in Pain? Yes   Pain Score 3    Pain Location Shoulder   Pain Orientation Left   Pain Descriptors / Indicators Aching;Tightness   Pain Type Surgical pain   Pain Onset More than a month ago   Pain Frequency Intermittent   Aggravating Factors  don't have muscle relaxers and overuse/overstretching arm   Pain Relieving Factors have some of my pain meds left so taking half of them and that helps some            [facility] PT Assessment - 05/13/16 0001      PROM   Left Shoulder Flexion 93 Degrees   Left Shoulder ABduction 73 Degrees                     [facility] Adult PT Treatment/Exercise - 05/13/16 0001      Shoulder Exercises: Pulleys   Flexion 3 minutes   Flexion Limitations Tactile cuing to decrease scapular compensations though pt required less cuing today.   ABduction 2 minutes  Scaption   ABduction Limitations Tactile cuing for correct UE positioning     Shoulder Exercises: ROM/Strengthening   Other ROM/Strengthening Exercises UE Ranger with Lt UE for shoulder flexion, scaption 20 times in each position with ranger having UE ~45 degrees in sitting and with tactile and VC for decrease scapular compensation     Electrical Stimulation   Electrical Stimulation Location Lt anterior shoulder/pectoralis and upper arm    Electrical Stimulation Action IFC to pts tolerance x15 minutes   Electrical Stimulation Parameters 80-150Hz     Electrical Stimulation Goals Pain     Manual Therapy   Manual Therapy Passive ROM   Passive ROM To Lt shoulder into flexion to pts tolerance   Other Manual Therapy soft tissue to Lt shoulder and bicep for pain relief and gentle trigger point release                    Short Term Clinic Goals - 04/28/16 1335      CC Short Term Goal  #1   Title Pt to demonstrate 40 degrees of PROM left shoulder ER in scapular plane 30 degrees   Status Achieved  CC Short Term Goal  #2   Title Pt to demonstate 90 degrees of flexion during AAROM exercises   Status Achieved     CC Short Term Goal  #3   Title Pt to demonstrate 30 degrees of PROM IR in scapular plane 45 degrees to side   Baseline unable to measure this yet per protocol 04/14/16; 60 degrees 04/28/16   Status Achieved             Long Term Clinic Goals - 05/13/16 1220      CC Long Term Goal  #1   Title Pt will demonstrate 160 degrees of flexion in supine   Baseline Supine P/ROM 93 degrees and painful 05/13/16   Status On-going     CC Long Term Goal  #2   Title Pt will demonstrate 75 degrees of external rotation in 90 degrees of abduction in supine   Baseline Supine P/ROM 40 degrees 05/13/16   Status On-going     CC Long Term Goal  #3   Title Pt will demonstrate 60 degrees of internal rotation at 90 degrees of abduction   Baseline Supine in slight scaption 60 degrees 05/13/16   Status On-going     CC Long Term Goal  #4   Title Pt will be independent in a home exercise program for gentle stretching and strengthening per protocol   Status On-going            Plan - 05/13/16 1215    Clinical Impression Statement Pt continues to be limited by pain with all motions especially at her end ROM. Her P/ROM flexion was unchanged but abduction did improve though she has pain with this. Pt has begun wearing her sling much less often during day but still finds herself in a protective posture/position holding her Lt UE to herself. Did report feeling benefit of electrical stimulation last session and today feeling like this helped with muscle relaxation. Overall pt continues with very slow progress and still demonstrating painful/protective end ranges and will benefit from  contnuing therapy to address these deficits.    Rehab Potential Good   Clinical Impairments Affecting Rehab Potential at home social situation   PT Frequency 3x / week   PT Duration 8 weeks   PT Treatment/Interventions Manual techniques;Passive range of motion;Therapeutic exercise   PT Next Visit Plan See when pt sees Dr. Onnie Graham again as her pain with motion is not seeming to improve. Cont per protocol and pts tolerance to increase Lt shoulder ROM and strength.   PT Home Exercise Plan AA/ROM for left shoulder flexion and abduction to prescribed limits.   Consulted and Agree with Plan of Care Patient      Patient will benefit from skilled therapeutic intervention in order to improve the following deficits and impairments:  Increased edema, Decreased knowledge of precautions, Decreased range of motion, Decreased strength, Impaired UE functional use, Increased fascial restricitons, Pain  Visit Diagnosis: Stiffness of left shoulder, not elsewhere classified  Pain in left shoulder  Localized edema     Problem List Patient Active Problem List   Diagnosis Date Noted  . S/P shoulder replacement 03/04/2016  . Constipation 12/16/2014  . Chemotherapy-induced nausea 12/16/2014  . Hyperglycemia 12/09/2014  . 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 Leukocytosis   . DVT (deep venous thrombosis) ([place]) 12/02/2014  . Osteoarthritis of both feet 11/26/2014  . Poor venous access   . Colorectal cancer ([place]) 10/03/2014  . Breast cancer of lower-inner quadrant of right  female breast ([place]) 09/27/2014  . Severe obesity (BMI >= 40) ([place]) 11/06/2013  . Anemia, iron deficiency 04/25/2013  . GERD (gastroesophageal reflux disease) 02/12/2013  . HTN (hypertension) 10/21/2011  . Hypothyroidism 10/21/2011  . Depression 10/21/2011  . Hyperlipidemia 10/21/2011  . Lung nodules 10/21/2011  . Vertigo 10/21/2011  . Vitamin D deficiency 10/21/2011    Otelia Limes, PTA 05/13/2016, 12:24  PM  [place], [place], [address] Phone: [phone]   Fax:  [phone]  Name: Jordan [name] MRN: [id number] Date of Birth: 16-May-1951

## 2016-05-14 ENCOUNTER — Encounter: Payer: Medicare Other | Admitting: Physical Therapy

## 2016-05-19 ENCOUNTER — Ambulatory Visit: Payer: Medicare Other | Attending: Orthopedic Surgery

## 2016-05-19 DIAGNOSIS — R600 Localized edema: Secondary | ICD-10-CM | POA: Diagnosis not present

## 2016-05-19 DIAGNOSIS — M25512 Pain in left shoulder: Secondary | ICD-10-CM | POA: Insufficient documentation

## 2016-05-19 DIAGNOSIS — M25612 Stiffness of left shoulder, not elsewhere classified: Secondary | ICD-10-CM | POA: Insufficient documentation

## 2016-05-19 NOTE — Therapy (Signed)
[place], [place], [address] Phone: [phone]   Fax:  [phone]  Physical Therapy Treatment  Patient Details  Name: Jordan [name] MRN: [id number] Date of Birth: 10/07/1950 Referring Provider: [name]  Encounter Date: 05/19/2016      PT End of Session - 05/19/16 1201    Visit Number 12   Number of Visits 22   Date for PT Re-Evaluation 05/27/16   PT Start Time 1102   PT Stop Time 1158   PT Time Calculation (min) 56 min   Activity Tolerance Patient tolerated treatment well   Behavior During Therapy [facility] for tasks assessed/performed      Past Medical History:  Diagnosis Date  . Anemia   . Arthritis    oa left shoulder  . Back pain    buldging disc  . Blood clot of neck vein   . Breast cancer ([place]) 10/17/2014   lower inner quadrant of the right breast  . Cancer ([place])    colon  . Constipation   . Depression    takes Wellbutrin daily  . Dizziness    r/t side effects from meds  . Family history of adverse reaction to anesthesia    It is hard for my mother to wake up from anesthesia.  . Generalized headaches    due to allergies, sinus  . GERD (gastroesophageal reflux disease)    takes Protonix as needed  . Hemorrhoids   . History of bronchitis    last time about 6-58yrs ago  . History of colon polyps   . History of hiatal hernia   . History of migraine    last one about 15+yrs ago  . History of UTI   . Hx of seasonal allergies    takes OTC allergy med nightly  . Hyperlipidemia    takes Zetia and Zocor daily  . Hypertension    takes Maxzide and Metoprolol daily  . Hypothyroidism    takes Synthroid daily  . Joint pain   . Leg swelling   . Mass of colon   . Nasal congestion   . Nausea   . Pneumonia    walking about 6-27yrs ago  . PONV (postoperative nausea and vomiting)    pt states she is very easy to sedate  . S/P radiation therapy 03/11/2015 through  04/25/2015    Right breast 4500 cGy in 25 sessions, right breast boost 1600 cGy in 8 sessions   . Thyroid disease   . Tuberculosis    as little girl    . Vertigo    takes Meclizine prn  . Wears glasses   . Wears glasses     Past Surgical History:  Procedure Laterality Date  . ABDOMINAL HYSTERECTOMY    . APPENDECTOMY    . CARPAL TUNNEL RELEASE    . [place]  . COLONOSCOPY    . ESOPHAGOGASTRODUODENOSCOPY    . EXPLORATORY LAPAROTOMY    . KNEE SURGERY  1998   right - arthroscopic  . PARTIAL COLECTOMY  03/30/2012   Procedure: PARTIAL COLECTOMY;  Surgeon: [name], MD;  Location: [facility];  Service: General;  Laterality: N/A;  . PORT-A-CATH REMOVAL N/A 06/25/2015   Procedure: REMOVAL PORT-A-CATH;  Surgeon: [name], MD;  Location: [facility];  Service: General;  Laterality: N/A;  . PORTACATH PLACEMENT    . RADIOACTIVE SEED GUIDED MASTECTOMY WITH AXILLARY SENTINEL LYMPH NODE BIOPSY Right 10/17/2014   Procedure: RADIOACTIVE  SEED GUIDED PARTIAL MASTECTOMY WITH AXILLARY SENTINEL LYMPH NODE BIOPSY;  Surgeon: [name], MD;  Location: [place];  Service: General;  Laterality: Right;  . TOTAL SHOULDER ARTHROPLASTY Left 03/04/2016   Procedure: LEFT TOTAL SHOULDER ARTHROPLASTY;  Surgeon: [name], MD;  Location: [facility];  Service: Orthopedics;  Laterality: Left;    There were no vitals filed for this visit.      Subjective Assessment - 05/19/16 1117    Subjective I came across my muscle relaxers that I couldn't find the other day, they were in a drawer that I hardly use so I know I didn't put them there. My Lt shoulder is bothering me a little today due to wearing a bra today for the first time in awhile and the strap is just irritating it.    Pertinent History Colon cancer 2013 with resection.  Breast cancer on right 2016 with chemo (finished July 2016) and radiation (finished early  September 2016).  HTN (in flux--pt. will talk to PCP about that).  Has a RTC tear in left shoulder and two cysts there, diagnosed by orthopedist.  Blood clot in neck spring 2016.  Had an UE  Doppler when the left arm swelling started and they found no evidence of blood clot for this episode.  Uses a cane for gait because of knee and ankle arthritis bilat. Orthopedist had her go ahead with rehab with [place] and wants to reassess in six weeks.   Currently in Pain? Yes   Pain Score 5    Pain Location Shoulder   Pain Orientation Left   Pain Descriptors / Indicators Sore   Pain Type Surgical pain   Pain Onset More than a month ago   Pain Frequency Intermittent   Aggravating Factors  wearing a bra   Pain Relieving Factors muscle relaxers, electrical stimulation has given relief 2-3 days             [facility] PT Assessment - 05/19/16 0001      AROM   Left Shoulder Flexion 97 Degrees   Left Shoulder ABduction 61 Degrees   Left Shoulder Internal Rotation 61 Degrees  slight abd but increased pain at upper trap   Left Shoulder External Rotation 42 Degrees  arm at side; 68 er with 45 degrees abd                     [facility] Adult PT Treatment/Exercise - 05/19/16 0001      Shoulder Exercises: Pulleys   Flexion Other (comment)  5 mins at pts pace   Flexion Limitations Minimal VC to decrease shoulder hike   ABduction 2 minutes  scaption   ABduction Limitations Tactile cuing for correct UE positioning     Shoulder Exercises: ROM/Strengthening   Other ROM/Strengthening Exercises UE Ranger with Lt UE for shoulder flexion, scaption 20 times in each position with ranger having UE ~45 degrees in sitting and with tactile and VC for decrease scapular compensation     Electrical Stimulation   Electrical Stimulation Location Lt anterior shoulder/pectoralis and upper arm    Electrical Stimulation Action IFC to pts tolerance x15 minutes, performed P/ROM throughout first 10 mins   Electrical  Stimulation Parameters 80-150 Hz   Electrical Stimulation Goals Pain     Manual Therapy   Manual Therapy Myofascial release;Passive ROM   Myofascial Release To Lt UE throuhgout P/ROM, UE pulling with oscillations throughout ROM   Passive ROM To Lt shoulder into flexion, abduction with  oscillations                   Short Term Clinic Goals - 04/28/16 1335      CC Short Term Goal  #1   Title Pt to demonstrate 40 degrees of PROM left shoulder ER in scapular plane 30 degrees   Status Achieved     CC Short Term Goal  #2   Title Pt to demonstate 90 degrees of flexion during AAROM exercises   Status Achieved     CC Short Term Goal  #3   Title Pt to demonstrate 30 degrees of PROM IR in scapular plane 45 degrees to side   Baseline unable to measure this yet per protocol 04/14/16; 60 degrees 04/28/16   Status Achieved             Long Term Clinic Goals - 05/13/16 1220      CC Long Term Goal  #1   Title Pt will demonstrate 160 degrees of flexion in supine   Baseline Supine P/ROM 93 degrees and painful 05/13/16   Status On-going     CC Long Term Goal  #2   Title Pt will demonstrate 75 degrees of external rotation in 90 degrees of abduction in supine   Baseline Supine P/ROM 40 degrees 05/13/16   Status On-going     CC Long Term Goal  #3   Title Pt will demonstrate 60 degrees of internal rotation at 90 degrees of abduction   Baseline Supine in slight scaption 60 degrees 05/13/16   Status On-going     CC Long Term Goal  #4   Title Pt will be independent in a home exercise program for gentle stretching and strengthening per protocol   Status On-going            Plan - 05/19/16 1202    Clinical Impression Statement Pt with slightly less guarding with all AA/P/ROM today. She has found her muscle relaxers (but reports bottle was half empty) so took them Sun and Mon but not yesterday or today. She tolerated implementation of new technique of joint distraction with small  oscillations throughout P/ROM and pt had noticeable increase with her tolerance and motion. Performed this before and during eletrical stimulation and pt reported feeling good after session today. Pt would benefit from a TENS unit if her doctor is agreeable.    Rehab Potential Good   Clinical Impairments Affecting Rehab Potential at home social situation   PT Frequency 3x / week   PT Duration 8 weeks   PT Treatment/Interventions Manual techniques;Passive range of motion;Therapeutic exercise;Electrical Stimulation   PT Next Visit Plan Cont per protocol and pts tolerance to increase Lt shoulder ROM and strength. Issue TENS if order signed.    Consulted and Agree with Plan of Care Patient      Patient will benefit from skilled therapeutic intervention in order to improve the following deficits and impairments:  Increased edema, Decreased knowledge of precautions, Decreased range of motion, Decreased strength, Impaired UE functional use, Increased fascial restricitons, Pain  Visit Diagnosis: Stiffness of left shoulder, not elsewhere classified  Left shoulder pain, unspecified chronicity  Localized edema     Problem List Patient Active Problem List   Diagnosis Date Noted  . S/P shoulder replacement 03/04/2016  . Constipation 12/16/2014  . Chemotherapy-induced nausea 12/16/2014  . Hyperglycemia 12/09/2014  . 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 Leukocytosis   . DVT (deep venous thrombosis) ([place]) 12/02/2014  . Osteoarthritis  of both feet 11/26/2014  . Poor venous access   . Colorectal cancer ([place]) 10/03/2014  . Breast cancer of lower-inner quadrant of right female breast ([place]) 09/27/2014  . Severe obesity (BMI >= 40) ([place]) 11/06/2013  . Anemia, iron deficiency 04/25/2013  . GERD (gastroesophageal reflux disease) 02/12/2013  . HTN (hypertension) 10/21/2011  . Hypothyroidism 10/21/2011  . Depression 10/21/2011  . Hyperlipidemia 10/21/2011  . Lung nodules 10/21/2011  .  Vertigo 10/21/2011  . Vitamin D deficiency 10/21/2011    Otelia Limes, PTA 05/19/2016, 12:09 PM  [place], [place], [address] Phone: [phone]   Fax:  [phone]  Name: ADHYA DLUGOSZ MRN: [id number] Date of Birth: 1951-06-22  Serafina Royals, PT 05/19/16 1:15 PM

## 2016-05-21 ENCOUNTER — Ambulatory Visit: Payer: Medicare Other | Admitting: Physical Therapy

## 2016-05-21 DIAGNOSIS — M25512 Pain in left shoulder: Secondary | ICD-10-CM | POA: Diagnosis not present

## 2016-05-21 DIAGNOSIS — R600 Localized edema: Secondary | ICD-10-CM

## 2016-05-21 DIAGNOSIS — M25612 Stiffness of left shoulder, not elsewhere classified: Secondary | ICD-10-CM

## 2016-05-21 NOTE — Therapy (Signed)
[place], [place], [address] Phone: [phone]   Fax:  [phone]  Physical Therapy Treatment  Patient Details  Name: Jordan [name] MRN: [id number] Date of Birth: 04-08-1951 Referring Provider: [name]  Encounter Date: 05/21/2016      PT End of Session - 05/21/16 1204    Visit Number 13   Number of Visits 24   Date for PT Re-Evaluation 06/25/16   PT Start Time 1018   PT Stop Time 1106   PT Time Calculation (min) 48 min   Activity Tolerance Patient tolerated treatment well   Behavior During Therapy [facility] for tasks assessed/performed      Past Medical History:  Diagnosis Date  . Anemia   . Arthritis    oa left shoulder  . Back pain    buldging disc  . Blood clot of neck vein   . Breast cancer ([place]) 10/17/2014   lower inner quadrant of the right breast  . Cancer ([place])    colon  . Constipation   . Depression    takes Wellbutrin daily  . Dizziness    r/t side effects from meds  . Family history of adverse reaction to anesthesia    It is hard for my mother to wake up from anesthesia.  . Generalized headaches    due to allergies, sinus  . GERD (gastroesophageal reflux disease)    takes Protonix as needed  . Hemorrhoids   . History of bronchitis    last time about 6-7yrs ago  . History of colon polyps   . History of hiatal hernia   . History of migraine    last one about 15+yrs ago  . History of UTI   . Hx of seasonal allergies    takes OTC allergy med nightly  . Hyperlipidemia    takes Zetia and Zocor daily  . Hypertension    takes Maxzide and Metoprolol daily  . Hypothyroidism    takes Synthroid daily  . Joint pain   . Leg swelling   . Mass of colon   . Nasal congestion   . Nausea   . Pneumonia    walking about 6-42yrs ago  . PONV (postoperative nausea and vomiting)    pt states she is very easy to sedate  . S/P radiation therapy 03/11/2015 through  04/25/2015    Right breast 4500 cGy in 25 sessions, right breast boost 1600 cGy in 8 sessions   . Thyroid disease   . Tuberculosis    as little girl    . Vertigo    takes Meclizine prn  . Wears glasses   . Wears glasses     Past Surgical History:  Procedure Laterality Date  . ABDOMINAL HYSTERECTOMY    . APPENDECTOMY    . CARPAL TUNNEL RELEASE    . [place]  . COLONOSCOPY    . ESOPHAGOGASTRODUODENOSCOPY    . EXPLORATORY LAPAROTOMY    . KNEE SURGERY  1998   right - arthroscopic  . PARTIAL COLECTOMY  03/30/2012   Procedure: PARTIAL COLECTOMY;  Surgeon: [name], MD;  Location: [place];  Service: General;  Laterality: N/A;  . PORT-A-CATH REMOVAL N/A 06/25/2015   Procedure: REMOVAL PORT-A-CATH;  Surgeon: [name], MD;  Location: [facility];  Service: General;  Laterality: N/A;  . PORTACATH PLACEMENT    . RADIOACTIVE SEED GUIDED MASTECTOMY WITH AXILLARY SENTINEL LYMPH NODE BIOPSY Right 10/17/2014   Procedure: RADIOACTIVE  SEED GUIDED PARTIAL MASTECTOMY WITH AXILLARY SENTINEL LYMPH NODE BIOPSY;  Surgeon: [name], MD;  Location: [place];  Service: General;  Laterality: Right;  . TOTAL SHOULDER ARTHROPLASTY Left 03/04/2016   Procedure: LEFT TOTAL SHOULDER ARTHROPLASTY;  Surgeon: [name], MD;  Location: [facility];  Service: Orthopedics;  Laterality: Left;    There were no vitals filed for this visit.      Subjective Assessment - 05/21/16 1025    Subjective I felt really good after last session because that stim really seemed to help.   Pertinent History Colon cancer 2013 with resection.  Breast cancer on right 2016 with chemo (finished July 2016) and radiation (finished early September 2016).  HTN (in flux--pt. will talk to PCP about that).  Has a RTC tear in left shoulder and two cysts there, diagnosed by orthopedist.  Blood clot in neck spring 2016.  Had an UE  Doppler when  the left arm swelling started and they found no evidence of blood clot for this episode.  Uses a cane for gait because of knee and ankle arthritis bilat. Orthopedist had her go ahead with rehab with [place] and wants to reassess in six weeks.   Currently in Pain? Yes   Pain Score 6    Pain Location Shoulder   Pain Orientation Left   Pain Descriptors / Indicators Sore   Pain Type Surgical pain   Pain Onset More than a month ago   Pain Frequency Intermittent   Aggravating Factors  moving too much   Pain Relieving Factors muscle relaxers, electrical stimulation                         [facility] Adult PT Treatment/Exercise - 05/21/16 0001      Shoulder Exercises: Standing   External Rotation Strengthening;Left;5 reps;Theraband  isometric; walking away from wall   Theraband Level (Shoulder External Rotation) Level 1 (Yellow)   Internal Rotation Strengthening;Left;5 reps  isometric hold against wall x 5 seconds     Shoulder Exercises: Pulleys   Flexion 3 minutes   Flexion Limitations VC initially to prevent shoulder hike   ABduction 2 minutes  scaption   ABduction Limitations pt reports this feels a little better today     Shoulder Exercises: ROM/Strengthening   Other ROM/Strengthening Exercises UE Ranger with Lt UE for shoulder flexion, scaption 20 times in each position with ranger having UE ~45 degrees in sitting and with tactile and VC for decrease scapular compensation     Manual Therapy   Myofascial Release To Lt UE throuhgout P/ROM, UE pulling with oscillations throughout ROM   Passive ROM To Lt shoulder into flexion, abduction with oscillations                   Short Term Clinic Goals - 04/28/16 1335      CC Short Term Goal  #1   Title Pt to demonstrate 40 degrees of PROM left shoulder ER in scapular plane 30 degrees   Status Achieved     CC Short Term Goal  #2   Title Pt to demonstate 90 degrees of flexion during AAROM exercises   Status Achieved      CC Short Term Goal  #3   Title Pt to demonstrate 30 degrees of PROM IR in scapular plane 45 degrees to side   Baseline unable to measure this yet per protocol 04/14/16; 60 degrees 04/28/16   Status Achieved  [facility] Goals - 05/21/16 1210      CC Long Term Goal  #1   Title Pt will demonstrate 160 degrees of flexion in supine   Baseline Supine P/ROM 93 degrees and painful 05/13/16; PROM 97 degrees on 05/19/16   Time 8   Period Weeks   Status On-going     CC Long Term Goal  #2   Title Pt will demonstrate 75 degrees of external rotation in 90 degrees of abduction in supine   Baseline Supine P/ROM 40 degrees 05/13/16; PROM 42 degrees in slight abduction on 05/19/16   Time 8   Period Weeks   Status On-going     CC Long Term Goal  #3   Title Pt will demonstrate 60 degrees of internal rotation at 90 degrees of abduction   Baseline Supine in slight scaption 60 degrees 05/13/16; PROM 61 degrees in slight abduction on 05/19/16   Time 8   Period Weeks   Status On-going     CC Long Term Goal  #4   Title Pt will be independent in a home exercise program for gentle stretching and strengthening per protocol   Time 8   Period Weeks   Status On-going            Plan - 05/21/16 1204    Clinical Impression Statement Patient demonstrates less guarding with PROM into flexion and abduction today with patient able to move further into abduction. Patient continues to report that small oscillations throughout PROM decreased her pain. Patient able to tolerate addition of left shoulder internal rotation isometric exercise, as well as a progression of her external rotation isometric exercise using a yellow theraband. Still awaiting approval from MD to issue her a TENS unit.   Rehab Potential Good   Clinical Impairments Affecting Rehab Potential at home social situation   PT Frequency 3x / week   PT Duration 8 weeks   PT Treatment/Interventions Manual techniques;Passive range  of motion;Therapeutic exercise;Electrical Stimulation   PT Next Visit Plan Cont per protocol and pts tolerance to increase Lt shoulder ROM and strength. Issue TENS if order signed.    PT Home Exercise Plan added left shoulder IR isometric and isometric ER using yellow theraband   Consulted and Agree with Plan of Care Patient      Patient will benefit from skilled therapeutic intervention in order to improve the following deficits and impairments:  Increased edema, Decreased knowledge of precautions, Decreased range of motion, Decreased strength, Impaired UE functional use, Increased fascial restricitons, Pain  Visit Diagnosis: Stiffness of left shoulder, not elsewhere classified  Left shoulder pain, unspecified chronicity  Localized edema     Problem List Patient Active Problem List   Diagnosis Date Noted  . S/P shoulder replacement 03/04/2016  . Constipation 12/16/2014  . Chemotherapy-induced nausea 12/16/2014  . Hyperglycemia 12/09/2014  . Internal jugular vein thrombosis ([place])   . Leukocytosis   . DVT (deep venous thrombosis) ([place]) 12/02/2014  . Osteoarthritis of both feet 11/26/2014  . Poor venous access   . Colorectal cancer ([place]) 10/03/2014  . Breast cancer of lower-inner quadrant of right female breast ([place]) 09/27/2014  . Severe obesity (BMI >= 40) ([place]) 11/06/2013  . Anemia, iron deficiency 04/25/2013  . GERD (gastroesophageal reflux disease) 02/12/2013  . HTN (hypertension) 10/21/2011  . Hypothyroidism 10/21/2011  . Depression 10/21/2011  . Hyperlipidemia 10/21/2011  . Lung nodules 10/21/2011  . Vertigo 10/21/2011  . Vitamin D deficiency 10/21/2011    Alexia Freestone  05/21/2016, 3:32 PM  [place], [place], [address] Phone: [phone]   Fax:  [phone]  Name: MADIGAN HELLUMS MRN: [id number] Date of Birth: 31-Jan-1951  Saverio Danker, SPT  Read, reviewed, edited and  agree with student's findings and recommendations.    Allyson Sabal, PT 05/21/16 3:32 PM

## 2016-05-24 ENCOUNTER — Ambulatory Visit: Payer: Medicare Other

## 2016-05-24 DIAGNOSIS — M25512 Pain in left shoulder: Secondary | ICD-10-CM | POA: Diagnosis not present

## 2016-05-24 DIAGNOSIS — R600 Localized edema: Secondary | ICD-10-CM | POA: Diagnosis not present

## 2016-05-24 DIAGNOSIS — M25612 Stiffness of left shoulder, not elsewhere classified: Secondary | ICD-10-CM | POA: Diagnosis not present

## 2016-05-24 NOTE — Therapy (Signed)
Jordan [name], [place], [address] Phone: [phone]   Fax:  [phone]  Physical Therapy Treatment  Patient Details  Name: Jordan [name] MRN: [id number] Date of Birth: 05-18-1951 Referring Provider: Justice [name]  Encounter Date: 05/24/2016      PT End of Session - 05/24/16 1157    Visit Number 14   Number of Visits 24   Date for PT Re-Evaluation 06/25/16   PT Start Time [id number]   PT Stop Time 1202   PT Time Calculation (min) 17 min   Activity Tolerance Patient tolerated treatment well   Behavior During Therapy [facility] for tasks assessed/performed      Past Medical History:  Diagnosis Date  . Anemia   . Arthritis    oa left shoulder  . Back pain    buldging disc  . Blood clot of neck vein   . Breast cancer ([place]) 10/17/2014   lower inner quadrant of the right breast  . Cancer ([place])    colon  . Constipation   . Depression    takes Wellbutrin daily  . Dizziness    r/t side effects from meds  . Family history of adverse reaction to anesthesia    It is hard for my mother to wake up from anesthesia.  . Generalized headaches    due to allergies, sinus  . GERD (gastroesophageal reflux disease)    takes Protonix as needed  . Hemorrhoids   . History of bronchitis    last time about 6-19yrs ago  . History of colon polyps   . History of hiatal hernia   . History of migraine    last one about 15+yrs ago  . History of UTI   . Hx of seasonal allergies    takes OTC allergy med nightly  . Hyperlipidemia    takes Zetia and Zocor daily  . Hypertension    takes Maxzide and Metoprolol daily  . Hypothyroidism    takes Synthroid daily  . Joint pain   . Leg swelling   . Mass of colon   . Nasal congestion   . Nausea   . Pneumonia    walking about 6-46yrs ago  . PONV (postoperative nausea and vomiting)    pt states she is very easy to sedate  . S/P radiation therapy 03/11/2015 through  04/25/2015    Right breast 4500 cGy in 25 sessions, right breast boost 1600 cGy in 8 sessions   . Thyroid disease   . Tuberculosis    as little girl    . Vertigo    takes Meclizine prn  . Wears glasses   . Wears glasses     Past Surgical History:  Procedure Laterality Date  . ABDOMINAL HYSTERECTOMY    . APPENDECTOMY    . CARPAL TUNNEL RELEASE    . [place]  . COLONOSCOPY    . ESOPHAGOGASTRODUODENOSCOPY    . EXPLORATORY LAPAROTOMY    . KNEE SURGERY  1998   right - arthroscopic  . PARTIAL COLECTOMY  03/30/2012   Procedure: PARTIAL COLECTOMY;  Surgeon: Jordan Ober, MD;  Location: [facility];  Service: General;  Laterality: N/A;  . PORT-A-CATH REMOVAL N/A 06/25/2015   Procedure: REMOVAL PORT-A-CATH;  Surgeon: Jordan Messing III, MD;  Location: [facility];  Service: General;  Laterality: N/A;  . PORTACATH PLACEMENT    . RADIOACTIVE SEED GUIDED MASTECTOMY WITH AXILLARY SENTINEL LYMPH NODE BIOPSY Right 10/17/2014   Procedure: RADIOACTIVE  SEED GUIDED PARTIAL MASTECTOMY WITH AXILLARY SENTINEL LYMPH NODE BIOPSY;  Surgeon: [place] Messing III, MD;  Location: [facility];  Service: General;  Laterality: Right;  . TOTAL SHOULDER ARTHROPLASTY Left 03/04/2016   Procedure: LEFT TOTAL SHOULDER ARTHROPLASTY;  Surgeon: [place] Britain, MD;  Location: [facility];  Service: Orthopedics;  Laterality: Left;    There were no vitals filed for this visit.      Subjective Assessment - 05/24/16 1149    Subjective I'm sorry I'm late! I forgot what time my appointment was. Can we just do the electrical stimulation bc my Rt shoulder is just really achy today due to getting my shirt on really quick.   Pertinent History Colon cancer 2013 with resection.  Breast cancer on right 2016 with chemo (finished July 2016) and radiation (finished early September 2016).  HTN (in flux--pt. will talk to PCP about that).  Has a RTC tear in left  shoulder and two cysts there, diagnosed by orthopedist.  Blood clot in neck spring 2016.  Had an UE  Doppler when the left arm swelling started and they found no evidence of blood clot for this episode.  Uses a cane for gait because of knee and ankle arthritis bilat. Orthopedist had her go ahead with rehab with [place] and wants to reassess in six weeks.   Currently in Pain? Yes   Pain Score 6    Pain Location Arm  Bicep   Pain Orientation Left;Upper   Pain Descriptors / Indicators Cramping   Pain Type Surgical pain   Pain Onset More than a month ago   Pain Frequency Intermittent   Aggravating Factors  got dressed too fast today   Pain Relieving Factors eletrical stimulation                         [facility] Adult PT Treatment/Exercise - 05/24/16 0001      Electrical Stimulation   Electrical Stimulation Location Lt anterior shoulder/pectoralis and upper arm    Electrical Stimulation Action IFC to pts tolerance x15 minutes   Electrical Stimulation Parameters 80-150Hz    Electrical Stimulation Goals Pain                   Short Term Clinic Goals - 04/28/16 1335      CC Short Term Goal  #1   Title Pt to demonstrate 40 degrees of PROM left shoulder ER in scapular plane 30 degrees   Status Achieved     CC Short Term Goal  #2   Title Pt to demonstate 90 degrees of flexion during AAROM exercises   Status Achieved     CC Short Term Goal  #3   Title Pt to demonstrate 30 degrees of PROM IR in scapular plane 45 degrees to side   Baseline unable to measure this yet per protocol 04/14/16; 60 degrees 04/28/16   Status Achieved             Long Term Clinic Goals - 05/21/16 1210      CC Long Term Goal  #1   Title Pt will demonstrate 160 degrees of flexion in supine   Baseline Supine P/ROM 93 degrees and painful 05/13/16; PROM 97 degrees on 05/19/16   Time 8   Period Weeks   Status On-going     CC Long Term Goal  #2   Title Pt will demonstrate 75 degrees of  external rotation in 90 degrees of abduction in supine  Baseline Supine P/ROM 40 degrees 05/13/16; PROM 42 degrees in slight abduction on 05/19/16   Time 8   Period Weeks   Status On-going     CC Long Term Goal  #3   Title Pt will demonstrate 60 degrees of internal rotation at 90 degrees of abduction   Baseline Supine in slight scaption 60 degrees 05/13/16; PROM 61 degrees in slight abduction on 05/19/16   Time 8   Period Weeks   Status On-going     CC Long Term Goal  #4   Title Pt will be independent in a home exercise program for gentle stretching and strengthening per protocol   Time 8   Period Weeks   Status On-going            Plan - 05/24/16 1158    Clinical Impression Statement Pt arrived at end of session and requested if she could at least get eletrical stimulation since her shoulder was so achy today. Asked pt if she wanted to come later today as there were openings on our schedule but she didn't want to come back. So only time for eletrical stimulation today.    Rehab Potential Good   Clinical Impairments Affecting Rehab Potential at home social situation   PT Frequency 3x / week   PT Duration 8 weeks   PT Treatment/Interventions Manual techniques;Passive range of motion;Therapeutic exercise;Electrical Stimulation   PT Next Visit Plan Cont per protocol and pts tolerance to increase Lt shoulder ROM and strength. Issue TENS if order signed.    Consulted and Agree with Plan of Care Patient      Patient will benefit from skilled therapeutic intervention in order to improve the following deficits and impairments:  Increased edema, Decreased knowledge of precautions, Decreased range of motion, Decreased strength, Impaired UE functional use, Increased fascial restricitons, Pain  Visit Diagnosis: Left shoulder pain, unspecified chronicity     Problem List Patient Active Problem List   Diagnosis Date Noted  . S/P shoulder replacement 03/04/2016  . Constipation  12/16/2014  . Chemotherapy-induced nausea 12/16/2014  . Hyperglycemia 12/09/2014  . 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 Leukocytosis   . DVT (deep venous thrombosis) ([place]) 12/02/2014  . Osteoarthritis of both feet 11/26/2014  . Poor venous access   . Colorectal cancer ([place]) 10/03/2014  . Breast cancer of lower-inner quadrant of right female breast ([place]) 09/27/2014  . Severe obesity (BMI >= 40) ([place]) 11/06/2013  . Anemia, iron deficiency 04/25/2013  . GERD (gastroesophageal reflux disease) 02/12/2013  . HTN (hypertension) 10/21/2011  . Hypothyroidism 10/21/2011  . Depression 10/21/2011  . Hyperlipidemia 10/21/2011  . Lung nodules 10/21/2011  . Vertigo 10/21/2011  . Vitamin D deficiency 10/21/2011    Otelia Limes, PTA 05/24/2016, 12:02 PM  [place], [place], [address] Phone: [phone]   Fax:  [phone]  Name: BEEDIE KRANZ MRN: [id number] Date of Birth: 08-02-1951

## 2016-05-26 ENCOUNTER — Ambulatory Visit: Payer: Medicare Other | Admitting: Physical Therapy

## 2016-05-26 DIAGNOSIS — M25512 Pain in left shoulder: Secondary | ICD-10-CM

## 2016-05-26 DIAGNOSIS — M25612 Stiffness of left shoulder, not elsewhere classified: Secondary | ICD-10-CM | POA: Diagnosis not present

## 2016-05-26 DIAGNOSIS — R600 Localized edema: Secondary | ICD-10-CM

## 2016-05-26 NOTE — Therapy (Signed)
[name], [place], [address] Phone: [phone]   Fax:  [phone]  Physical Therapy Treatment  Patient Details  Name: Jordan [name] MRN: [id number] Date of Birth: Dec 28, 1950 Referring Provider: [name]  Encounter Date: 05/26/2016      PT End of Session - 05/26/16 1151    Visit Number 15   Number of Visits 24   Date for PT Re-Evaluation 06/25/16   PT Start Time 1054   PT Stop Time 1155   PT Time Calculation (min) 61 min   Activity Tolerance Patient tolerated treatment well   Behavior During Therapy [facility] for tasks assessed/performed      Past Medical History:  Diagnosis Date  . Anemia   . Arthritis    oa left shoulder  . Back pain    buldging disc  . Blood clot of neck vein   . Breast cancer ([place]) 10/17/2014   lower inner quadrant of the right breast  . Cancer ([place])    colon  . Constipation   . Depression    takes Wellbutrin daily  . Dizziness    r/t side effects from meds  . Family history of adverse reaction to anesthesia    It is hard for my mother to wake up from anesthesia.  . Generalized headaches    due to allergies, sinus  . GERD (gastroesophageal reflux disease)    takes Protonix as needed  . Hemorrhoids   . History of bronchitis    last time about 6-66yrs ago  . History of colon polyps   . History of hiatal hernia   . History of migraine    last one about 15+yrs ago  . History of UTI   . Hx of seasonal allergies    takes OTC allergy med nightly  . Hyperlipidemia    takes Zetia and Zocor daily  . Hypertension    takes Maxzide and Metoprolol daily  . Hypothyroidism    takes Synthroid daily  . Joint pain   . Leg swelling   . Mass of colon   . Nasal congestion   . Nausea   . Pneumonia    walking about 6-89yrs ago  . PONV (postoperative nausea and vomiting)    pt states she is very easy to sedate  . S/P radiation therapy 03/11/2015 through  04/25/2015    Right breast 4500 cGy in 25 sessions, right breast boost 1600 cGy in 8 sessions   . Thyroid disease   . Tuberculosis    as little girl    . Vertigo    takes Meclizine prn  . Wears glasses   . Wears glasses     Past Surgical History:  Procedure Laterality Date  . ABDOMINAL HYSTERECTOMY    . APPENDECTOMY    . CARPAL TUNNEL RELEASE    . [place]  . COLONOSCOPY    . ESOPHAGOGASTRODUODENOSCOPY    . EXPLORATORY LAPAROTOMY    . KNEE SURGERY  1998   right - arthroscopic  . PARTIAL COLECTOMY  03/30/2012   Procedure: PARTIAL COLECTOMY;  Surgeon: [name], MD;  Location: [facility];  Service: General;  Laterality: N/A;  . PORT-A-CATH REMOVAL N/A 06/25/2015   Procedure: REMOVAL PORT-A-CATH;  Surgeon: [name], MD;  Location: [facility];  Service: General;  Laterality: N/A;  . PORTACATH PLACEMENT    . RADIOACTIVE SEED GUIDED MASTECTOMY WITH AXILLARY SENTINEL LYMPH NODE BIOPSY Right 10/17/2014   Procedure: RADIOACTIVE  SEED GUIDED PARTIAL MASTECTOMY WITH AXILLARY SENTINEL LYMPH NODE BIOPSY;  Surgeon: [name], MD;  Location: [facility];  Service: General;  Laterality: Right;  . TOTAL SHOULDER ARTHROPLASTY Left 03/04/2016   Procedure: LEFT TOTAL SHOULDER ARTHROPLASTY;  Surgeon: [name], MD;  Location: [facility];  Service: Orthopedics;  Laterality: Left;    There were no vitals filed for this visit.      Subjective Assessment - 05/26/16 1056    Subjective Patient states she felt good after the new exercises on Friday and she has been working on those at home.   Pertinent History Colon cancer 2013 with resection.  Breast cancer on right 2016 with chemo (finished July 2016) and radiation (finished early September 2016).  HTN (in flux--pt. will talk to PCP about that).  Has a RTC tear in left shoulder and two cysts there, diagnosed by orthopedist.  Blood clot in neck spring  2016.  Had an UE  Doppler when the left arm swelling started and they found no evidence of blood clot for this episode.  Uses a cane for gait because of knee and ankle arthritis bilat. Orthopedist had her go ahead with rehab with [place] and wants to reassess in six weeks.   Currently in Pain? Yes   Pain Score 4    Pain Location Arm   Pain Orientation Left;Upper   Pain Descriptors / Indicators Cramping   Pain Type Surgical pain   Pain Onset More than a month ago   Pain Frequency Intermittent   Aggravating Factors  getting dressed too fast   Pain Relieving Factors electrical stimulation, pain meds            [facility] PT Assessment - 05/26/16 0001      PROM   Left Shoulder Flexion 106 Degrees   Left Shoulder ABduction 91 Degrees                     [facility] Adult PT Treatment/Exercise - 05/26/16 0001      Shoulder Exercises: Standing   External Rotation Strengthening;Left;Theraband;10 reps  isometric; walking away from wall   Theraband Level (Shoulder External Rotation) Level 1 (Yellow)   Internal Rotation Strengthening;Left;10 reps  isometric hold against wall x 5 seconds   Flexion Strengthening;Left;10 reps;Theraband  isometric; walking away from wall   Theraband Level (Shoulder Flexion) Level 1 (Yellow)   Extension Strengthening;Left;10 reps;Theraband  isometric; walking away from wall   Theraband Level (Shoulder Extension) Level 1 (Yellow)     Shoulder Exercises: Pulleys   Flexion 3 minutes   Flexion Limitations VC initially to prevent shoulder hike   ABduction 3 minutes   ABduction Limitations in about 45 degrees of scaption     Electrical Stimulation   Electrical Stimulation Location Lt anterior shoulder/pectoralis and upper arm    Electrical Stimulation Action IFC to pts tolerance x 10 minutes   Electrical Stimulation Parameters 80-150 Hz   Electrical Stimulation Goals Pain     Manual Therapy   Myofascial Release To Lt UE throuhgout P/ROM, UE pulling with  oscillations throughout ROM   Passive ROM To Lt shoulder into flexion, abduction with oscillations                   Short Term Clinic Goals - 04/28/16 1335      CC Short Term Goal  #1   Title Pt to demonstrate 40 degrees of PROM left shoulder ER in scapular plane 30 degrees   Status Achieved  CC Short Term Goal  #2   Title Pt to demonstate 90 degrees of flexion during AAROM exercises   Status Achieved     CC Short Term Goal  #3   Title Pt to demonstrate 30 degrees of PROM IR in scapular plane 45 degrees to side   Baseline unable to measure this yet per protocol 04/14/16; 60 degrees 04/28/16   Status Achieved             Long Term Clinic Goals - 05/26/16 1201      CC Long Term Goal  #1   Title Pt will demonstrate 160 degrees of flexion in supine   Baseline Supine P/ROM 93 degrees and painful 05/13/16; PROM 97 degrees on 05/19/16; PROM 106 degrees on 05/26/16   Time 8   Period Weeks   Status On-going     CC Long Term Goal  #2   Title Pt will demonstrate 75 degrees of external rotation in 90 degrees of abduction in supine   Baseline Supine P/ROM 40 degrees 05/13/16; PROM 42 degrees in slight abduction on 05/19/16   Time 8   Period Weeks   Status On-going     CC Long Term Goal  #3   Title Pt will demonstrate 60 degrees of internal rotation at 90 degrees of abduction   Baseline Supine in slight scaption 60 degrees 05/13/16; PROM 61 degrees in slight abduction on 05/19/16   Time 8   Period Weeks   Status On-going     CC Long Term Goal  #4   Title Pt will be independent in a home exercise program for gentle stretching and strengthening per protocol   Time 8   Period Weeks   Status On-going     CC Long Term Goal  #5   Title --   Baseline --   Time --   Period --   Status --            Plan - 05/26/16 1157    Clinical Impression Statement Patient demonstrates increased left shoulder PROM this session and able to tolerate progression of her shoulder  isometric strengthening exercises. Therapist continued with left shoulder PROM with oscillations with patient reporting the oscillations help to decrease her pain. Ended session with 10 minutes of electrical stim to left shoulder for pain relief.   Rehab Potential Good   Clinical Impairments Affecting Rehab Potential at home social situation   PT Frequency 3x / week   PT Duration 8 weeks   PT Treatment/Interventions Manual techniques;Passive range of motion;Therapeutic exercise;Electrical Stimulation   PT Next Visit Plan Cont per protocol and pts tolerance to increase Lt shoulder ROM and strength. Issue TENS if order signed.    PT Home Exercise Plan added left shoulder isometric flexion and extension walkouts using yellow theraband   Consulted and Agree with Plan of Care Patient      Patient will benefit from skilled therapeutic intervention in order to improve the following deficits and impairments:  Increased edema, Decreased knowledge of precautions, Decreased range of motion, Decreased strength, Impaired UE functional use, Increased fascial restricitons, Pain  Visit Diagnosis: Left shoulder pain, unspecified chronicity  Stiffness of left shoulder, not elsewhere classified  Localized edema     Problem List Patient Active Problem List   Diagnosis Date Noted  . S/P shoulder replacement 03/04/2016  . Constipation 12/16/2014  . Chemotherapy-induced nausea 12/16/2014  . Hyperglycemia 12/09/2014  . 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 Leukocytosis   .  DVT (deep venous thrombosis) ([place]) 12/02/2014  . Osteoarthritis of both feet 11/26/2014  . Poor venous access   . Colorectal cancer ([place]) 10/03/2014  . Breast cancer of lower-inner quadrant of right female breast ([place]) 09/27/2014  . Severe obesity (BMI >= 40) ([place]) 11/06/2013  . Anemia, iron deficiency 04/25/2013  . GERD (gastroesophageal reflux disease) 02/12/2013  . HTN (hypertension) 10/21/2011  . Hypothyroidism  10/21/2011  . Depression 10/21/2011  . Hyperlipidemia 10/21/2011  . Lung nodules 10/21/2011  . Vertigo 10/21/2011  . Vitamin D deficiency 10/21/2011    Mellody Life 05/26/2016, 12:15 PM  [place], [place], [address] Phone: [phone]   Fax:  [phone]  Name: AHJA CEGIELSKI MRN: [id number] Date of Birth: 27-Nov-1950   Saverio Danker, SPT  This entire session was guided, instructed, and directly supervised by Serafina Royals, PT.  Read, reviewed, edited and agree with student's findings and recommendations.   Serafina Royals, PT 05/26/16 12:50 PM

## 2016-05-28 ENCOUNTER — Ambulatory Visit: Payer: Medicare Other | Admitting: Physical Therapy

## 2016-05-28 DIAGNOSIS — M25612 Stiffness of left shoulder, not elsewhere classified: Secondary | ICD-10-CM | POA: Diagnosis not present

## 2016-05-28 DIAGNOSIS — R600 Localized edema: Secondary | ICD-10-CM | POA: Diagnosis not present

## 2016-05-28 DIAGNOSIS — M25512 Pain in left shoulder: Secondary | ICD-10-CM

## 2016-05-28 NOTE — Therapy (Signed)
[place], [place], [address] Phone: [phone]   Fax:  [phone]  Physical Therapy Treatment  Patient Details  Name: Jordan [name] MRN: [id number] Date of Birth: [birth year] Referring Provider: [name]  Encounter Date: 05/28/2016      PT End of Session - 05/28/16 1206    Visit Number 16   Number of Visits 24   Date for PT Re-Evaluation 06/25/16   PT Start Time 1058   PT Stop Time 1200   PT Time Calculation (min) 62 min   Activity Tolerance Patient tolerated treatment well   Behavior During Therapy [facility] for tasks assessed/performed      Past Medical History:  Diagnosis Date  . Anemia   . Arthritis    oa left shoulder  . Back pain    buldging disc  . Blood clot of neck vein   . Breast cancer ([place]) 10/17/2014   lower inner quadrant of the right breast  . Cancer ([place])    colon  . Constipation   . Depression    takes Wellbutrin daily  . Dizziness    r/t side effects from meds  . Family history of adverse reaction to anesthesia    It is hard for my mother to wake up from anesthesia.  . Generalized headaches    due to allergies, sinus  . GERD (gastroesophageal reflux disease)    takes Protonix as needed  . Hemorrhoids   . History of bronchitis    last time about 6-41yrs ago  . History of colon polyps   . History of hiatal hernia   . History of migraine    last one about 15+yrs ago  . History of UTI   . Hx of seasonal allergies    takes OTC allergy med nightly  . Hyperlipidemia    takes Zetia and Zocor daily  . Hypertension    takes Maxzide and Metoprolol daily  . Hypothyroidism    takes Synthroid daily  . Joint pain   . Leg swelling   . Mass of colon   . Nasal congestion   . Nausea   . Pneumonia    walking about 6-56yrs ago  . PONV (postoperative nausea and vomiting)    pt states she is very easy to sedate  . S/P radiation therapy 03/11/2015 through  04/25/2015    Right breast 4500 cGy in 25 sessions, right breast boost 1600 cGy in 8 sessions   . Thyroid disease   . Tuberculosis    as little girl    . Vertigo    takes Meclizine prn  . Wears glasses   . Wears glasses     Past Surgical History:  Procedure Laterality Date  . ABDOMINAL HYSTERECTOMY    . APPENDECTOMY    . CARPAL TUNNEL RELEASE    . [place]  . COLONOSCOPY    . ESOPHAGOGASTRODUODENOSCOPY    . EXPLORATORY LAPAROTOMY    . KNEE SURGERY  1998   right - arthroscopic  . PARTIAL COLECTOMY  03/30/2012   Procedure: PARTIAL COLECTOMY;  Surgeon: [name], MD;  Location: [place];  Service: General;  Laterality: N/A;  . PORT-A-CATH REMOVAL N/A 06/25/2015   Procedure: REMOVAL PORT-A-CATH;  Surgeon: [name], MD;  Location: [place];  Service: General;  Laterality: N/A;  . PORTACATH PLACEMENT    . RADIOACTIVE SEED GUIDED MASTECTOMY WITH AXILLARY SENTINEL LYMPH NODE BIOPSY Right 10/17/2014   Procedure: RADIOACTIVE  SEED GUIDED PARTIAL MASTECTOMY WITH AXILLARY SENTINEL LYMPH NODE BIOPSY;  Surgeon: [name], MD;  Location: [place];  Service: General;  Laterality: Right;  . TOTAL SHOULDER ARTHROPLASTY Left 03/04/2016   Procedure: LEFT TOTAL SHOULDER ARTHROPLASTY;  Surgeon: [name], MD;  Location: [place];  Service: Orthopedics;  Laterality: Left;    There were no vitals filed for this visit.      Subjective Assessment - 05/28/16 1059    Subjective Patient states she hasn't done her exercises at home, but she did feel good after last session. She has been doing a lot of packing as she has plans to move into a new apartment.   Pertinent History Colon cancer 2013 with resection.  Breast cancer on right 2016 with chemo (finished July 2016) and radiation (finished early September 2016).  HTN (in flux--pt. will talk to PCP about that).  Has a RTC tear in left shoulder and  two cysts there, diagnosed by orthopedist.  Blood clot in neck spring 2016.  Had an UE  Doppler when the left arm swelling started and they found no evidence of blood clot for this episode.  Uses a cane for gait because of knee and ankle arthritis bilat. Orthopedist had her go ahead with rehab with [place] and wants to reassess in six weeks.   Currently in Pain? Yes   Pain Score 4    Pain Location Shoulder   Pain Orientation Left   Pain Descriptors / Indicators Sharp   Pain Type Surgical pain   Aggravating Factors  getting dressed too fast   Pain Relieving Factors electrical stimulation, pain meds                         [facility] Adult PT Treatment/Exercise - 05/28/16 0001      Shoulder Exercises: Standing   External Rotation Strengthening;Left;Theraband;10 reps  isometric; walking away from wall   Theraband Level (Shoulder External Rotation) Level 1 (Yellow)   Internal Rotation Strengthening;Left;10 reps  isometric hold against wall x 10 seconds   Flexion Strengthening;Left;10 reps;Theraband  isometric; walking away from wall   Theraband Level (Shoulder Flexion) Level 1 (Yellow)   Extension Strengthening;Left;10 reps;Theraband  isometric; walking away from wall   Theraband Level (Shoulder Extension) Level 1 (Yellow)     Shoulder Exercises: Pulleys   Flexion 3 minutes   ABduction 3 minutes   ABduction Limitations in about 45 degrees of scaption     Electrical Stimulation   Electrical Stimulation Location Lt anterior shoulder/pectoralis and upper arm    Electrical Stimulation Action IFC to pts tolerance   Electrical Stimulation Parameters 80-150 Hz   Electrical Stimulation Goals Pain     Manual Therapy   Manual Therapy Manual Lymphatic Drainage (MLD);Myofascial release;Passive ROM   Myofascial Release To Lt UE throuhgout P/ROM, UE pulling with oscillations throughout ROM   Manual Lymphatic Drainage (MLD) In supine, short neck, superficial abdomen, left inguinal, left  chest, and left axillo-inguinal anastomosis to address swelling at anterior left shoulder and left axilla   Passive ROM To Lt shoulder into flexion, abduction with oscillations                   Short Term Clinic Goals - 04/28/16 1335      CC Short Term Goal  #1   Title Pt to demonstrate 40 degrees of PROM left shoulder ER in scapular plane 30 degrees   Status Achieved     CC  Short Term Goal  #2   Title Pt to demonstate 90 degrees of flexion during AAROM exercises   Status Achieved     CC Short Term Goal  #3   Title Pt to demonstrate 30 degrees of PROM IR in scapular plane 45 degrees to side   Baseline unable to measure this yet per protocol 04/14/16; 60 degrees 04/28/16   Status Achieved             Long Term Clinic Goals - 05/26/16 1201      CC Long Term Goal  #1   Title Pt will demonstrate 160 degrees of flexion in supine   Baseline Supine P/ROM 93 degrees and painful 05/13/16; PROM 97 degrees on 05/19/16; PROM 106 degrees on 05/26/16   Time 8   Period Weeks   Status On-going     CC Long Term Goal  #2   Title Pt will demonstrate 75 degrees of external rotation in 90 degrees of abduction in supine   Baseline Supine P/ROM 40 degrees 05/13/16; PROM 42 degrees in slight abduction on 05/19/16   Time 8   Period Weeks   Status On-going     CC Long Term Goal  #3   Title Pt will demonstrate 60 degrees of internal rotation at 90 degrees of abduction   Baseline Supine in slight scaption 60 degrees 05/13/16; PROM 61 degrees in slight abduction on 05/19/16   Time 8   Period Weeks   Status On-going     CC Long Term Goal  #4   Title Pt will be independent in a home exercise program for gentle stretching and strengthening per protocol   Time 8   Period Weeks   Status On-going     CC Long Term Goal  #5   Title --   Baseline --   Time --   Period --   Status --            Plan - 05/28/16 1207    Clinical Impression Statement Patient reports she felt good  after performing the new left shoulder isometric strengthening exercises and thinks they are beneficial. She continues to report reduction in pain with oscillations during left shoulder PROM with patient able to move further into shoulder flexion. Patient had some pain at the end of the session, which was reduced with 10 minutes of electrical stimulation to left shoulder.   Rehab Potential Good   Clinical Impairments Affecting Rehab Potential at home social situation   PT Frequency 3x / week   PT Duration 8 weeks   PT Treatment/Interventions Manual techniques;Passive range of motion;Therapeutic exercise;Electrical Stimulation;Manual lymph drainage   PT Next Visit Plan Cont per protocol and pts tolerance to increase Lt shoulder ROM and strength. Issue TENS if order signed.    Consulted and Agree with Plan of Care Patient      Patient will benefit from skilled therapeutic intervention in order to improve the following deficits and impairments:  Increased edema, Decreased knowledge of precautions, Decreased range of motion, Decreased strength, Impaired UE functional use, Increased fascial restricitons, Pain  Visit Diagnosis: Left shoulder pain, unspecified chronicity  Stiffness of left shoulder, not elsewhere classified  Localized edema     Problem List Patient Active Problem List   Diagnosis Date Noted  . S/P shoulder replacement 03/04/2016  . Constipation 12/16/2014  . Chemotherapy-induced nausea 12/16/2014  . Hyperglycemia 12/09/2014  . Internal jugular vein thrombosis ([place])   . Leukocytosis   . DVT (deep venous thrombosis) (  [place]) 12/02/2014  . Osteoarthritis of both feet 11/26/2014  . Poor venous access   . Colorectal cancer ([place]) 10/03/2014  . Breast cancer of lower-inner quadrant of right female breast ([place]) 09/27/2014  . Severe obesity (BMI >= 40) ([place]) 11/06/2013  . Anemia, iron deficiency 04/25/2013  . GERD (gastroesophageal reflux disease) 02/12/2013  . HTN  (hypertension) 10/21/2011  . Hypothyroidism 10/21/2011  . Depression 10/21/2011  . Hyperlipidemia 10/21/2011  . Lung nodules 10/21/2011  . Vertigo 10/21/2011  . Vitamin D deficiency 10/21/2011    Mellody Life 05/28/2016, 12:12 PM  [place], [place], [address] Phone: [phone]   Fax:  [phone]  Name: Jordan [name] MRN: [id number] Date of Birth: 11/10/1950   Saverio Danker, SPT

## 2016-05-31 ENCOUNTER — Ambulatory Visit: Payer: Medicare Other | Admitting: Physical Therapy

## 2016-05-31 DIAGNOSIS — R600 Localized edema: Secondary | ICD-10-CM | POA: Diagnosis not present

## 2016-05-31 DIAGNOSIS — M25512 Pain in left shoulder: Secondary | ICD-10-CM

## 2016-05-31 DIAGNOSIS — M25612 Stiffness of left shoulder, not elsewhere classified: Secondary | ICD-10-CM

## 2016-05-31 NOTE — Therapy (Signed)
[place], [place], [address] Phone: [phone]   Fax:  [phone]  Physical Therapy Treatment  Patient Details  Name: Jordan [name] MRN: [id number] Date of Birth: 10-25-1950 Referring Provider: [name]  Encounter Date: 05/31/2016      PT End of Session - 05/31/16 1349    Visit Number 17   Number of Visits 24   Date for PT Re-Evaluation 06/25/16   PT Start Time 1300   PT Stop Time 1347   PT Time Calculation (min) 47 min   Activity Tolerance Patient tolerated treatment well   Behavior During Therapy [facility] for tasks assessed/performed      Past Medical History:  Diagnosis Date  . Anemia   . Arthritis    oa left shoulder  . Back pain    buldging disc  . Blood clot of neck vein   . Breast cancer ([place]) 10/17/2014   lower inner quadrant of the right breast  . Cancer ([place])    colon  . Constipation   . Depression    takes Wellbutrin daily  . Dizziness    r/t side effects from meds  . Family history of adverse reaction to anesthesia    It is hard for my mother to wake up from anesthesia.  . Generalized headaches    due to allergies, sinus  . GERD (gastroesophageal reflux disease)    takes Protonix as needed  . Hemorrhoids   . History of bronchitis    last time about 6-64yrs ago  . History of colon polyps   . History of hiatal hernia   . History of migraine    last one about 15+yrs ago  . History of UTI   . Hx of seasonal allergies    takes OTC allergy med nightly  . Hyperlipidemia    takes Zetia and Zocor daily  . Hypertension    takes Maxzide and Metoprolol daily  . Hypothyroidism    takes Synthroid daily  . Joint pain   . Leg swelling   . Mass of colon   . Nasal congestion   . Nausea   . Pneumonia    walking about 6-3yrs ago  . PONV (postoperative nausea and vomiting)    pt states she is very easy to sedate  . S/P radiation therapy 03/11/2015 through  04/25/2015    Right breast 4500 cGy in 25 sessions, right breast boost 1600 cGy in 8 sessions   . Thyroid disease   . Tuberculosis    as little girl    . Vertigo    takes Meclizine prn  . Wears glasses   . Wears glasses     Past Surgical History:  Procedure Laterality Date  . ABDOMINAL HYSTERECTOMY    . APPENDECTOMY    . CARPAL TUNNEL RELEASE    . [place]  . COLONOSCOPY    . ESOPHAGOGASTRODUODENOSCOPY    . EXPLORATORY LAPAROTOMY    . KNEE SURGERY  1998   right - arthroscopic  . PARTIAL COLECTOMY  03/30/2012   Procedure: PARTIAL COLECTOMY;  Surgeon: [name], MD;  Location: [place];  Service: General;  Laterality: N/A;  . PORT-A-CATH REMOVAL N/A 06/25/2015   Procedure: REMOVAL PORT-A-CATH;  Surgeon: [name], MD;  Location: [place];  Service: General;  Laterality: N/A;  . PORTACATH PLACEMENT    . RADIOACTIVE SEED GUIDED MASTECTOMY WITH AXILLARY SENTINEL LYMPH NODE BIOPSY Right 10/17/2014   Procedure: RADIOACTIVE  SEED GUIDED PARTIAL MASTECTOMY WITH AXILLARY SENTINEL LYMPH NODE BIOPSY;  Surgeon: [name], MD;  Location: [place];  Service: General;  Laterality: Right;  . TOTAL SHOULDER ARTHROPLASTY Left 03/04/2016   Procedure: LEFT TOTAL SHOULDER ARTHROPLASTY;  Surgeon: [name], MD;  Location: [place];  Service: Orthopedics;  Laterality: Left;    There were no vitals filed for this visit.      Subjective Assessment - 05/31/16 1303    Subjective Reports she has not been using her left arm while packing, but left shoulder still hurts when she's moving around too much.   Pertinent History Colon cancer 2013 with resection.  Breast cancer on right 2016 with chemo (finished July 2016) and radiation (finished early September 2016).  HTN (in flux--pt. will talk to PCP about that).  Has a RTC tear in left shoulder and two cysts there, diagnosed by orthopedist.  Blood clot  in neck spring 2016.  Had an UE  Doppler when the left arm swelling started and they found no evidence of blood clot for this episode.  Uses a cane for gait because of knee and ankle arthritis bilat. Orthopedist had her go ahead with rehab with [place] and wants to reassess in six weeks.   Currently in Pain? Yes   Pain Score 3   none when she first walked in   Pain Location Shoulder   Pain Orientation Left   Pain Descriptors / Indicators Sharp   Pain Type Surgical pain   Pain Onset More than a month ago   Pain Frequency Intermittent   Aggravating Factors  getting dressed   Pain Relieving Factors electrical stimulation, pain meds                         [facility] Adult PT Treatment/Exercise - 05/31/16 0001      Shoulder Exercises: Standing   External Rotation Strengthening;Left;Theraband;10 reps  isometric; walking away from wall   Theraband Level (Shoulder External Rotation) Level 2 (Red)   Internal Rotation Strengthening;Left;10 reps  isometric hold against wall x 10 seconds   Flexion Strengthening;Left;10 reps;Theraband  isometric; walking away from wall   Theraband Level (Shoulder Flexion) Level 2 (Red)   Extension Strengthening;Left;10 reps;Theraband  isometric; walking away from wall   Theraband Level (Shoulder Extension) Level 2 (Red)     Shoulder Exercises: Pulleys   Flexion 3 minutes   ABduction 3 minutes   ABduction Limitations in about 45 degrees of scaption     Manual Therapy   Myofascial Release To Lt UE throuhgout P/ROM, UE pulling with oscillations throughout ROM   Manual Lymphatic Drainage (MLD) In supine, short neck, superficial abdomen, left inguinal, left chest, and left axillo-inguinal anastomosis to address swelling at anterior left shoulder and left axilla   Passive ROM To Lt shoulder into flexion, abduction, and ER with oscillations                   Short Term Clinic Goals - 04/28/16 1335      CC Short Term Goal  #1   Title Pt to  demonstrate 40 degrees of PROM left shoulder ER in scapular plane 30 degrees   Status Achieved     CC Short Term Goal  #2   Title Pt to demonstate 90 degrees of flexion during AAROM exercises   Status Achieved     CC Short Term Goal  #3   Title Pt to demonstrate 30 degrees of PROM  IR in scapular plane 45 degrees to side   Baseline unable to measure this yet per protocol 04/14/16; 60 degrees 04/28/16   Status Achieved             Long Term Clinic Goals - 05/26/16 1201      CC Long Term Goal  #1   Title Pt will demonstrate 160 degrees of flexion in supine   Baseline Supine P/ROM 93 degrees and painful 05/13/16; PROM 97 degrees on 05/19/16; PROM 106 degrees on 05/26/16   Time 8   Period Weeks   Status On-going     CC Long Term Goal  #2   Title Pt will demonstrate 75 degrees of external rotation in 90 degrees of abduction in supine   Baseline Supine P/ROM 40 degrees 05/13/16; PROM 42 degrees in slight abduction on 05/19/16   Time 8   Period Weeks   Status On-going     CC Long Term Goal  #3   Title Pt will demonstrate 60 degrees of internal rotation at 90 degrees of abduction   Baseline Supine in slight scaption 60 degrees 05/13/16; PROM 61 degrees in slight abduction on 05/19/16   Time 8   Period Weeks   Status On-going     CC Long Term Goal  #4   Title Pt will be independent in a home exercise program for gentle stretching and strengthening per protocol   Time 8   Period Weeks   Status On-going     CC Long Term Goal  #5   Title --   Baseline --   Time --   Period --   Status --            Plan - 05/31/16 1349    Clinical Impression Statement Patient able to progress isometric strengthening exercises to red theraband this session without any pain. Therapist continued with manual lymph drainage because it seemed to help with swelling last session. Able to move further into left shoulder flexion PROM today.   Rehab Potential Good   Clinical Impairments Affecting  Rehab Potential at home social situation   PT Frequency 3x / week   PT Duration 8 weeks   PT Treatment/Interventions Manual techniques;Passive range of motion;Therapeutic exercise;Electrical Stimulation;Manual lymph drainage   PT Next Visit Plan Cont per protocol and pts tolerance to increase Lt shoulder ROM and strength. Issue TENS if order signed.    Consulted and Agree with Plan of Care Patient      Patient will benefit from skilled therapeutic intervention in order to improve the following deficits and impairments:  Increased edema, Decreased knowledge of precautions, Decreased range of motion, Decreased strength, Impaired UE functional use, Increased fascial restricitons, Pain  Visit Diagnosis: Left shoulder pain, unspecified chronicity  Stiffness of left shoulder, not elsewhere classified  Localized edema     Problem List Patient Active Problem List   Diagnosis Date Noted  . S/P shoulder replacement 03/04/2016  . Constipation 12/16/2014  . Chemotherapy-induced nausea 12/16/2014  . Hyperglycemia 12/09/2014  . Internal jugular vein thrombosis ([place])   . Leukocytosis   . DVT (deep venous thrombosis) ([place]) 12/02/2014  . Osteoarthritis of both feet 11/26/2014  . Poor venous access   . Colorectal cancer ([place]) 10/03/2014  . Breast cancer of lower-inner quadrant of right female breast ([place]) 09/27/2014  . Severe obesity (BMI >= 40) ([place]) 11/06/2013  . Anemia, iron deficiency 04/25/2013  . GERD (gastroesophageal reflux disease) 02/12/2013  . HTN (hypertension) 10/21/2011  . Hypothyroidism 10/21/2011  .  Depression 10/21/2011  . Hyperlipidemia 10/21/2011  . Lung nodules 10/21/2011  . Vertigo 10/21/2011  . Vitamin D deficiency 10/21/2011    Alexia Freestone 05/31/2016, 4:39 PM  [place], [place], [address] Phone: [phone]   Fax:  [phone]  Name: ANNAGAIL TAKEMURA MRN: [id number] Date  of Birth: 25-Apr-1951   Saverio Danker, SPT Read, reviewed, edited and agree with student's findings and recommendations.    Allyson Sabal, PT 05/31/16 4:39 PM

## 2016-06-02 DIAGNOSIS — Z471 Aftercare following joint replacement surgery: Secondary | ICD-10-CM | POA: Diagnosis not present

## 2016-06-02 DIAGNOSIS — Z96612 Presence of left artificial shoulder joint: Secondary | ICD-10-CM | POA: Diagnosis not present

## 2016-06-04 ENCOUNTER — Ambulatory Visit: Payer: Medicare Other | Admitting: Physical Therapy

## 2016-06-04 DIAGNOSIS — R600 Localized edema: Secondary | ICD-10-CM | POA: Diagnosis not present

## 2016-06-04 DIAGNOSIS — M25612 Stiffness of left shoulder, not elsewhere classified: Secondary | ICD-10-CM

## 2016-06-04 DIAGNOSIS — M25512 Pain in left shoulder: Secondary | ICD-10-CM

## 2016-06-04 NOTE — Therapy (Addendum)
[place], [place], [address] Phone: [phone]   Fax:  [phone]  Physical Therapy Treatment  Patient Details  Name: Jordan [name] MRN: [id number] Date of Birth: 09/02/1950 Referring Provider: [name]  Encounter Date: 06/04/2016      PT End of Session - 06/04/16 1156    Visit Number 18   Number of Visits 24   Date for PT Re-Evaluation 06/25/16   PT Start Time 1103   PT Stop Time 1150   PT Time Calculation (min) 47 min   Activity Tolerance Patient tolerated treatment well   Behavior During Therapy [facility] for tasks assessed/performed      Past Medical History:  Diagnosis Date  . Anemia   . Arthritis    oa left shoulder  . Back pain    buldging disc  . Blood clot of neck vein   . Breast cancer ([facility]) 10/17/2014   lower inner quadrant of the right breast  . Cancer ([place])    colon  . Constipation   . Depression    takes Wellbutrin daily  . Dizziness    r/t side effects from meds  . Family history of adverse reaction to anesthesia    It is hard for my mother to wake up from anesthesia.  . Generalized headaches    due to allergies, sinus  . GERD (gastroesophageal reflux disease)    takes Protonix as needed  . Hemorrhoids   . History of bronchitis    last time about 6-98yrs ago  . History of colon polyps   . History of hiatal hernia   . History of migraine    last one about 15+yrs ago  . History of UTI   . Hx of seasonal allergies    takes OTC allergy med nightly  . Hyperlipidemia    takes Zetia and Zocor daily  . Hypertension    takes Maxzide and Metoprolol daily  . Hypothyroidism    takes Synthroid daily  . Joint pain   . Leg swelling   . Mass of colon   . Nasal congestion   . Nausea   . Pneumonia    walking about 6-2yrs ago  . PONV (postoperative nausea and vomiting)    pt states she is very easy to sedate  . S/P radiation therapy 03/11/2015 through  04/25/2015    Right breast 4500 cGy in 25 sessions, right breast boost 1600 cGy in 8 sessions   . Thyroid disease   . Tuberculosis    as little girl    . Vertigo    takes Meclizine prn  . Wears glasses   . Wears glasses     Past Surgical History:  Procedure Laterality Date  . ABDOMINAL HYSTERECTOMY    . APPENDECTOMY    . CARPAL TUNNEL RELEASE    . [place]  . COLONOSCOPY    . ESOPHAGOGASTRODUODENOSCOPY    . EXPLORATORY LAPAROTOMY    . KNEE SURGERY  1998   right - arthroscopic  . PARTIAL COLECTOMY  03/30/2012   Procedure: PARTIAL COLECTOMY;  Surgeon: [name], MD;  Location: [place];  Service: General;  Laterality: N/A;  . PORT-A-CATH REMOVAL N/A 06/25/2015   Procedure: REMOVAL PORT-A-CATH;  Surgeon: [name], MD;  Location: [place];  Service: General;  Laterality: N/A;  . PORTACATH PLACEMENT    . RADIOACTIVE SEED GUIDED MASTECTOMY WITH AXILLARY SENTINEL LYMPH NODE BIOPSY Right 10/17/2014   Procedure: RADIOACTIVE  SEED GUIDED PARTIAL MASTECTOMY WITH AXILLARY SENTINEL LYMPH NODE BIOPSY;  Surgeon: [name], MD;  Location: [place];  Service: General;  Laterality: Right;  . TOTAL SHOULDER ARTHROPLASTY Left 03/04/2016   Procedure: LEFT TOTAL SHOULDER ARTHROPLASTY;  Surgeon: [name], MD;  Location: [place];  Service: Orthopedics;  Laterality: Left;    There were no vitals filed for this visit.      Subjective Assessment - 06/04/16 1108    Subjective Reports her shoulder is feeling better today because she has been using a seat massager at home that seems to help.   Pertinent History Colon cancer 2013 with resection.  Breast cancer on right 2016 with chemo (finished July 2016) and radiation (finished early September 2016).  HTN (in flux--pt. will talk to PCP about that).  Has a RTC tear in left shoulder and two cysts there, diagnosed by orthopedist.  Blood clot in neck  spring 2016.  Had an UE  Doppler when the left arm swelling started and they found no evidence of blood clot for this episode.  Uses a cane for gait because of knee and ankle arthritis bilat. Orthopedist had her go ahead with rehab with [place] and wants to reassess in six weeks.   Currently in Pain? Yes   Pain Score 2    Pain Location Shoulder   Pain Orientation Left   Pain Descriptors / Indicators Sore   Pain Type Surgical pain            [facility] PT Assessment - 06/04/16 0001      PROM   Left Shoulder ABduction 94 Degrees   Left Shoulder External Rotation 50 Degrees  in 45 degrees abduction                     [facility] Adult PT Treatment/Exercise - 06/04/16 0001      Self-Care   Self-Care Other Self-Care Comments   Other Self-Care Comments  issued pt a TENS unit and instructed pt in use of this     Shoulder Exercises: Pulleys   Flexion 3 minutes   ABduction 3 minutes   ABduction Limitations in about 45 degrees of scaption; rest after 2 minutes     Manual Therapy   Myofascial Release To Lt UE throuhgout P/ROM, UE pulling with oscillations throughout ROM   Passive ROM To Lt shoulder into flexion, abduction, and ER with oscillations  done with pt using TENS unit                PT Education - 06/04/16 1155    Education provided Yes   Education Details instructed pt in use of TENS unit   Person(s) Educated Patient   Methods Explanation;Demonstration   Comprehension Verbalized understanding;Returned demonstration           Short Term Clinic Goals - 04/28/16 1335      CC Short Term Goal  #1   Title Pt to demonstrate 40 degrees of PROM left shoulder ER in scapular plane 30 degrees   Status Achieved     CC Short Term Goal  #2   Title Pt to demonstate 90 degrees of flexion during AAROM exercises   Status Achieved     CC Short Term Goal  #3   Title Pt to demonstrate 30 degrees of PROM IR in scapular plane 45 degrees to side   Baseline unable to measure  this yet per protocol 04/14/16; 60 degrees 04/28/16   Status Achieved  [facility] Goals - 06/04/16 1200      CC Long Term Goal  #1   Title Pt will demonstrate 160 degrees of flexion in supine   Baseline Supine P/ROM 93 degrees and painful 05/13/16; PROM 97 degrees on 05/19/16; PROM 106 degrees on 05/26/16   Time 8   Period Weeks   Status On-going     CC Long Term Goal  #2   Title Pt will demonstrate 75 degrees of external rotation in 90 degrees of abduction in supine   Baseline Supine P/ROM 40 degrees 05/13/16; PROM 42 degrees in slight abduction on 05/19/16; PROM 50 degrees in 45 degrees abduction on 06/04/16   Time 8   Period Weeks   Status On-going     CC Long Term Goal  #3   Title Pt will demonstrate 60 degrees of internal rotation at 90 degrees of abduction   Baseline Supine in slight scaption 60 degrees 05/13/16; PROM 61 degrees in slight abduction on 05/19/16   Time 8   Period Weeks   Status On-going     CC Long Term Goal  #4   Title Pt will be independent in a home exercise program for gentle stretching and strengthening per protocol   Time 8   Status On-going            Plan - 06/04/16 1214    PT Next Visit Plan Cont per protocol and pts tolerance to increase Lt shoulder ROM and strength. assess benefit fo TENS and use it during treatment for pain control       Patient will benefit from skilled therapeutic intervention in order to improve the following deficits and impairments:  Increased edema, Decreased knowledge of precautions, Decreased range of motion, Decreased strength, Impaired UE functional use, Increased fascial restricitons, Pain  Visit Diagnosis: Left shoulder pain, unspecified chronicity  Stiffness of left shoulder, not elsewhere classified  Localized edema     Problem List Patient Active Problem List   Diagnosis Date Noted  . S/P shoulder replacement 03/04/2016  . Constipation 12/16/2014  . Chemotherapy-induced nausea  12/16/2014  . Hyperglycemia 12/09/2014  . 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 Leukocytosis   . DVT (deep venous thrombosis) ([place]) 12/02/2014  . Osteoarthritis of both feet 11/26/2014  . Poor venous access   . Colorectal cancer ([place]) 10/03/2014  . Breast cancer of lower-inner quadrant of right female breast ([place]) 09/27/2014  . Severe obesity (BMI >= 40) ([place]) 11/06/2013  . Anemia, iron deficiency 04/25/2013  . GERD (gastroesophageal reflux disease) 02/12/2013  . HTN (hypertension) 10/21/2011  . Hypothyroidism 10/21/2011  . Depression 10/21/2011  . Hyperlipidemia 10/21/2011  . Lung nodules 10/21/2011  . Vertigo 10/21/2011  . Vitamin D deficiency 10/21/2011    Norwood Levo 06/04/2016, 12:16 PM  [place], [place], [address] Phone: [phone]   Fax:  [phone]  Name: HALANA HAEFFNER MRN: [id number] Date of Birth: 07/19/1951   Saverio Danker, SPT

## 2016-06-09 ENCOUNTER — Ambulatory Visit: Payer: Medicare Other

## 2016-06-09 DIAGNOSIS — R600 Localized edema: Secondary | ICD-10-CM | POA: Diagnosis not present

## 2016-06-09 DIAGNOSIS — M25512 Pain in left shoulder: Secondary | ICD-10-CM

## 2016-06-09 DIAGNOSIS — M25612 Stiffness of left shoulder, not elsewhere classified: Secondary | ICD-10-CM | POA: Diagnosis not present

## 2016-06-09 NOTE — Therapy (Signed)
[place], [place], [address] Phone: [phone]   Fax:  [phone]  Physical Therapy Treatment  Patient Details  Name: Jordan [name] MRN: [id number] Date of Birth: 02-06-1951 Referring Provider: [name]  Encounter Date: 06/09/2016      PT End of Session - 06/09/16 1516    Visit Number 19   Number of Visits 24   Date for PT Re-Evaluation 06/25/16   PT Start Time 4010   PT Stop Time 1516   PT Time Calculation (min) 59 min   Activity Tolerance Patient tolerated treatment well   Behavior During Therapy [facility] for tasks assessed/performed      Past Medical History:  Diagnosis Date  . Anemia   . Arthritis    oa left shoulder  . Back pain    buldging disc  . Blood clot of neck vein   . Breast cancer ([place]) 10/17/2014   lower inner quadrant of the right breast  . Cancer ([employer])    colon  . Constipation   . Depression    takes Wellbutrin daily  . Dizziness    r/t side effects from meds  . Family history of adverse reaction to anesthesia    It is hard for my mother to wake up from anesthesia.  . Generalized headaches    due to allergies, sinus  . GERD (gastroesophageal reflux disease)    takes Protonix as needed  . Hemorrhoids   . History of bronchitis    last time about 6-2yr ago  . History of colon polyps   . History of hiatal hernia   . History of migraine    last one about 15+yrs ago  . History of UTI   . Hx of seasonal allergies    takes OTC allergy med nightly  . Hyperlipidemia    takes Zetia and Zocor daily  . Hypertension    takes Maxzide and Metoprolol daily  . Hypothyroidism    takes Synthroid daily  . Joint pain   . Leg swelling   . Mass of colon   . Nasal congestion   . Nausea   . Pneumonia    walking about 6-780yrago  . PONV (postoperative nausea and vomiting)    pt states she is very easy to sedate  . S/P radiation therapy 03/11/2015 through  04/25/2015    Right breast 4500 cGy in 25 sessions, right breast boost 1600 cGy in 8 sessions   . Thyroid disease   . Tuberculosis    as little girl    . Vertigo    takes Meclizine prn  . Wears glasses   . Wears glasses     Past Surgical History:  Procedure Laterality Date  . ABDOMINAL HYSTERECTOMY    . APPENDECTOMY    . CARPAL TUNNEL RELEASE    . [place]. [place]OSCOPY    . ESOPHAGOGASTRODUODENOSCOPY    . EXPLORATORY LAPAROTOMY    . KNEE SURGERY  1998   right - arthroscopic  . PARTIAL COLECTOMY  03/30/2012   Procedure: PARTIAL COLECTOMY;  Surgeon: JaGwenyth OberMD;  [facility] Service: General;  Laterality: N/A;  . PORT-A-CATH REMOVAL N/A 06/25/2015   Procedure: REMOVAL PORT-A-CATH;  Surgeon: PaAutumn MessingII, MD;  [facility] Service: General;  Laterality: N/A;  . PORTACATH PLACEMENT    . RADIOACTIVE SEED GUIDED MASTECTOMY WITH AXILLARY SENTINEL LYMPH NODE BIOPSY Right 10/17/2014   Procedure: RADIOACTIVE  SEED GUIDED PARTIAL MASTECTOMY WITH AXILLARY SENTINEL LYMPH NODE BIOPSY;  Surgeon: [name], MD;  Location: [place];  Service: General;  Laterality: Right;  . TOTAL SHOULDER ARTHROPLASTY Left 03/04/2016   Procedure: LEFT TOTAL SHOULDER ARTHROPLASTY;  Surgeon: [name], MD;  Location: [place];  Service: Orthopedics;  Laterality: Left;    There were no vitals filed for this visit.      Subjective Assessment - 06/09/16 1419    Subjective I haven't used the TENS unit yet at home just because I used my chair massager over the weekend and honestly I just haven't wanted my husband to see it. But I'm going to try it in the next few days.   Pertinent History Colon cancer 2013 with resection.  Breast cancer on right 2016 with chemo (finished July 2016) and radiation (finished early September 2016).  HTN (in flux--pt. will talk to PCP about that).  Has a RTC tear in  left shoulder and two cysts there, diagnosed by orthopedist.  Blood clot in neck spring 2016.  Had an UE  Doppler when the left arm swelling started and they found no evidence of blood clot for this episode.  Uses a cane for gait because of knee and ankle arthritis bilat. Orthopedist had her go ahead with rehab with [place] and wants to reassess in six weeks.   Currently in Pain? Yes   Pain Score 2    Pain Location Shoulder   Pain Orientation Left   Pain Descriptors / Indicators Sore   Pain Type Surgical pain   Pain Onset More than a month ago   Pain Frequency Intermittent   Aggravating Factors  getting dressed is still a trigger but I think it's gotten a little better   Pain Relieving Factors electrical stimulation and my chair massager            [facility] PT Assessment - 06/09/16 0001      AROM   Left Shoulder Flexion 116 Degrees   Left Shoulder ABduction 80 Degrees   Left Shoulder External Rotation 78 Degrees  in 60 degrees abd                     [facility] Adult PT Treatment/Exercise - 06/09/16 0001      Elbow Exercises   Elbow Flexion Strengthening;Left;20 reps  2 sets of 20 reps   Bar Weights/Barbell (Elbow Flexion) 2 lbs   Elbow Flexion Limitations Able to do more weight but pt reports her wrist and hand were hurting some   Wrist Flexion Strengthening;Left;20 reps   Bar Weights/Barbell (Wrist Flexion) 2 lbs   Wrist Flexion Limitations Required tactile cuing to decrease compensation of elbow flexion     Shoulder Exercises: Standing   External Rotation Strengthening;Left;10 reps;Theraband  2 sets of 10; difficult for pt at end range some pain   Theraband Level (Shoulder External Rotation) Level 2 (Red)   Internal Rotation Strengthening;Left;10 reps;Theraband   Theraband Level (Shoulder Internal Rotation) Level 2 (Red)  2 sets of 10    Flexion Strengthening;Left;10 reps  isometric, walking away from wall   Theraband Level (Shoulder Flexion) Level 2 (Red)    Extension Strengthening;Left;10 reps;Theraband  isometric, walking away from wall   Theraband Level (Shoulder Extension) Level 2 (Red)     Shoulder Exercises: Pulleys   Flexion 3 minutes   ABduction 3 minutes     Shoulder Exercises: Therapy Ball   Flexion 10 reps  Roll ball up wall with  lean into top of stretch     Manual Therapy   Myofascial Release To Lt UE throughout P/ROM   Passive ROM Into Lt shoulder flexion, abduction and er to pts tolerance  Done with pt wearing her TENS unit                   Short Term Clinic Goals - 04/28/16 1335      CC Short Term Goal  #1   Title Pt to demonstrate 40 degrees of PROM left shoulder ER in scapular plane 30 degrees   Status Achieved     CC Short Term Goal  #2   Title Pt to demonstate 90 degrees of flexion during AAROM exercises   Status Achieved     CC Short Term Goal  #3   Title Pt to demonstrate 30 degrees of PROM IR in scapular plane 45 degrees to side   Baseline unable to measure this yet per protocol 04/14/16; 60 degrees 04/28/16   Status Achieved             Long Term Clinic Goals - 06/09/16 1519      CC Long Term Goal  #1   Title Pt will demonstrate 160 degrees of flexion in supine   Baseline Supine P/ROM 93 degrees and painful 05/13/16; PROM 97 degrees on 05/19/16; PROM 106 degrees on 05/26/16; 116 degrees in standing 06/09/16   Status On-going     CC Long Term Goal  #2   Title Pt will demonstrate 75 degrees of external rotation in 90 degrees of abduction in supine   Baseline Supine P/ROM 40 degrees 05/13/16; PROM 42 degrees in slight abduction on 05/19/16; PROM 50 degrees in 45 degrees abduction on 06/04/16; 78 degrees in 60 degrees abd 06/09/16   Status Partially Met     CC Long Term Goal  #3   Title Pt will demonstrate 60 degrees of internal rotation at 90 degrees of abduction   Baseline Supine in slight scaption 60 degrees 05/13/16; PROM 61 degrees in slight abduction on 05/19/16; able to attain <60  degrees in 60 degrees abd in standing 06/09/16   Status Partially Met     CC Long Term Goal  #4   Title Pt will be independent in a home exercise program for gentle stretching and strengthening per protocol   Status On-going            Plan - 06/09/16 1517    Clinical Impression Statement Pt hasn't used her TENS unit at home as of yet but we did use it again during therapy during P/ROM and pt tolerated this well today. She also did good with progression of IR/er with red theraband okayed per protocol. All her A/ROM measurements have improved as well. Pt has demonstrated good progress this week. She is considering community exercise programs, including the [facility].   Rehab Potential Good   Clinical Impairments Affecting Rehab Potential at home social situation   PT Frequency 3x / week   PT Duration 8 weeks   PT Treatment/Interventions Manual techniques;Passive range of motion;Therapeutic exercise;Electrical Stimulation   PT Next Visit Plan Cont per protocol and pts tolerance to increase Lt shoulder ROM and strength. assess benefit fo TENS and use it during treatment for pain control    Consulted and Agree with Plan of Care Patient      Patient will benefit from skilled therapeutic intervention in order to improve the following deficits and impairments:  Increased edema, Decreased knowledge of  precautions, Decreased range of motion, Decreased strength, Impaired UE functional use, Increased fascial restricitons, Pain  Visit Diagnosis: Left shoulder pain, unspecified chronicity  Stiffness of left shoulder, not elsewhere classified  Localized edema     Problem List Patient Active Problem List   Diagnosis Date Noted  . S/P shoulder replacement 03/04/2016  . Constipation 12/16/2014  . Chemotherapy-induced nausea 12/16/2014  . Hyperglycemia 12/09/2014  . 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 Leukocytosis   . DVT (deep venous thrombosis) ([place]) 12/02/2014  .  Osteoarthritis of both feet 11/26/2014  . Poor venous access   . Colorectal cancer ([place]) 10/03/2014  . Breast cancer of lower-inner quadrant of right female breast ([place]) 09/27/2014  . Severe obesity (BMI >= 40) ([place]) 11/06/2013  . Anemia, iron deficiency 04/25/2013  . GERD (gastroesophageal reflux disease) 02/12/2013  . HTN (hypertension) 10/21/2011  . Hypothyroidism 10/21/2011  . Depression 10/21/2011  . Hyperlipidemia 10/21/2011  . Lung nodules 10/21/2011  . Vertigo 10/21/2011  . Vitamin D deficiency 10/21/2011    Otelia Limes, PTA 06/09/2016, 3:22 PM  [place], [place], [address] Phone: [phone]   Fax:  [phone]  Name: Jordan [name] MRN: [id number] Date of Birth: March 08, 1951

## 2016-06-15 ENCOUNTER — Ambulatory Visit: Payer: Medicare Other

## 2016-06-15 DIAGNOSIS — M25612 Stiffness of left shoulder, not elsewhere classified: Secondary | ICD-10-CM

## 2016-06-15 DIAGNOSIS — R600 Localized edema: Secondary | ICD-10-CM

## 2016-06-15 DIAGNOSIS — M25512 Pain in left shoulder: Secondary | ICD-10-CM | POA: Diagnosis not present

## 2016-06-15 NOTE — Therapy (Signed)
[place], [place], [address] Phone: [phone]   Fax:  [phone]  Physical Therapy Treatment  Patient Details  Name: Jordan [name] MRN: [id number] Date of Birth: 1951-07-26 Referring Provider: [name]  Encounter Date: 06/15/2016      PT End of Session - 06/15/16 1153    Visit Number 20   Number of Visits 24   Date for PT Re-Evaluation 06/25/16   PT Start Time 1104   PT Stop Time 1150   PT Time Calculation (min) 46 min   Activity Tolerance Patient tolerated treatment well   Behavior During Therapy [facility] for tasks assessed/performed      Past Medical History:  Diagnosis Date  . Anemia   . Arthritis    oa left shoulder  . Back pain    buldging disc  . Blood clot of neck vein   . Breast cancer (Jordan [name]) 10/17/2014   lower inner quadrant of the right breast  . Cancer (Jordan [name])    colon  . Constipation   . Depression    takes Wellbutrin daily  . Dizziness    r/t side effects from meds  . Family history of adverse reaction to anesthesia    It is hard for my mother to wake up from anesthesia.  . Generalized headaches    due to allergies, sinus  . GERD (gastroesophageal reflux disease)    takes Protonix as needed  . Hemorrhoids   . History of bronchitis    last time about 6-67yr ago  . History of colon polyps   . History of hiatal hernia   . History of migraine    last one about 15+yrs ago  . History of UTI   . Hx of seasonal allergies    takes OTC allergy med nightly  . Hyperlipidemia    takes Zetia and Zocor daily  . Hypertension    takes Maxzide and Metoprolol daily  . Hypothyroidism    takes Synthroid daily  . Joint pain   . Leg swelling   . Mass of colon   . Nasal congestion   . Nausea   . Pneumonia    walking about 6-780yrago  . PONV (postoperative nausea and vomiting)    pt states she is very easy to sedate  . S/P radiation therapy 03/11/2015 through  04/25/2015    Right breast 4500 cGy in 25 sessions, right breast boost 1600 cGy in 8 sessions   . Thyroid disease   . Tuberculosis    as little girl    . Vertigo    takes Meclizine prn  . Wears glasses   . Wears glasses     Past Surgical History:  Procedure Laterality Date  . ABDOMINAL HYSTERECTOMY    . APPENDECTOMY    . CARPAL TUNNEL RELEASE    . [place] COL[place]    . ESOPHAGOGASTRODUODENOSCOPY    . EXPLORATORY LAPAROTOMY    . KNEE SURGERY  1998   right - arthroscopic  . PARTIAL COLECTOMY  03/30/2012   Procedure: PARTIAL COLECTOMY;  Surgeon: Jordan OberMD;  [facility]: General;  Laterality: N/A;  . PORT-A-CATH REMOVAL N/A 06/25/2015   Procedure: REMOVAL PORT-A-CATH;  Surgeon: Jordan MessingII, MD;  [facility] Service: General;  Laterality: N/A;  . PORTACATH PLACEMENT    . RADIOACTIVE SEED GUIDED MASTECTOMY WITH AXILLARY SENTINEL LYMPH NODE BIOPSY Right 10/17/2014   Procedure: RADIOACTIVE  SEED GUIDED PARTIAL MASTECTOMY WITH AXILLARY SENTINEL LYMPH NODE BIOPSY;  Surgeon: Jordan Messing III, MD;  Location: [place];  Service: General;  Laterality: Right;  . TOTAL SHOULDER ARTHROPLASTY Left 03/04/2016   Procedure: LEFT TOTAL SHOULDER ARTHROPLASTY;  Surgeon: [name], MD;  Location: [place];  Service: Orthopedics;  Laterality: Left;    There were no vitals filed for this visit.      Subjective Assessment - 06/15/16 1113    Subjective I used the TENS unit all weekend and it's been helping the pain to get better. My Lt shoulder isn't hurting bad today, actually have the TENS unit on now.    Pertinent History Colon cancer 2013 with resection.  Breast cancer on right 2016 with chemo (finished July 2016) and radiation (finished early September 2016).  HTN (in flux--pt. will talk to PCP about that).  Has a RTC tear in left shoulder and two cysts there,  diagnosed by orthopedist.  Blood clot in neck spring 2016.  Had an UE  Doppler when the left arm swelling started and they found no evidence of blood clot for this episode.  Uses a cane for gait because of knee and ankle arthritis bilat. Orthopedist had her go ahead with rehab with [place] and wants to reassess in six weeks.   Currently in Pain? Yes   Pain Score 2    Pain Location Shoulder   Pain Orientation Left   Pain Descriptors / Indicators Dull   Pain Onset More than a month ago   Pain Frequency Intermittent   Aggravating Factors  getting dressed still flares up my pain but not as bad as it used too   Pain Relieving Factors electrical stimulation helped alot over weekend                  Jordan [name] - 06/15/16 0001    Open a tight or new jar Moderate difficulty   Do heavy household chores (wash walls, wash floors) Severe difficulty   Carry a shopping bag or briefcase Moderate difficulty   Wash your back Moderate difficulty   Use a knife to cut food Moderate difficulty   Recreational activities in which you take some force or impact through your arm, shoulder, or hand (golf, hammering, tennis) Moderate difficulty   During the past week, to what extent has your arm, shoulder or hand problem interfered with your normal social activities with family, friends, neighbors, or groups? Quite a bit   During the past week, to what extent has your arm, shoulder or hand problem limited your work or other regular daily activities Modererately   Arm, shoulder, or hand pain. Moderate   Tingling (pins and needles) in your arm, shoulder, or hand Moderate   Difficulty Sleeping Severe difficulty   DASH Score 56.82 %               [facility] Adult PT Treatment/Exercise - 06/15/16 0001      Shoulder Exercises: Pulleys   Flexion 2 minutes   ABduction 2 minutes   ABduction Limitations In about 60 degrees of scaption     Shoulder Exercises: Therapy Ball   Flexion 10 reps  With forward lean  into top of stretch hold 3-5 seconds   Flexion Limitations Rt hand on Lt to help push at end of ROM     Shoulder Exercises: ROM/Strengthening   Other ROM/Strengthening Exercises FInger Ladder Lt UE 5 times in scaption up to 17 with tactile and VC to decrease  scapular compensation throughout     Manual Therapy   Myofascial Release To Lt UE throughout P/ROM   Manual Lymphatic Drainage (MLD) In supine, short neck, superficial abdomen, left inguinal, left chest, and left axillo-inguinal anastomosis to address swelling at anterior left shoulder and left axilla   Passive ROM Into Lt shoulder flexion, abduction and er to pts tolerance  While pt wearing her TENS unit                   Short Term Clinic Goals - 04/28/16 1335      CC Short Term Goal  #1   Title Pt to demonstrate 40 degrees of PROM left shoulder ER in scapular plane 30 degrees   Status Achieved     CC Short Term Goal  #2   Title Pt to demonstate 90 degrees of flexion during AAROM exercises   Status Achieved     CC Short Term Goal  #3   Title Pt to demonstrate 30 degrees of PROM IR in scapular plane 45 degrees to side   Baseline unable to measure this yet per protocol 04/14/16; 60 degrees 04/28/16   Status Achieved             Long Term Clinic Goals - 06/09/16 1519      CC Long Term Goal  #1   Title Pt will demonstrate 160 degrees of flexion in supine   Baseline Supine P/ROM 93 degrees and painful 05/13/16; PROM 97 degrees on 05/19/16; PROM 106 degrees on 05/26/16; 116 degrees in standing 06/09/16   Status On-going     CC Long Term Goal  #2   Title Pt will demonstrate 75 degrees of external rotation in 90 degrees of abduction in supine   Baseline Supine P/ROM 40 degrees 05/13/16; PROM 42 degrees in slight abduction on 05/19/16; PROM 50 degrees in 45 degrees abduction on 06/04/16; 78 degrees in 60 degrees abd 06/09/16   Status Partially Met     CC Long Term Goal  #3   Title Pt will demonstrate 60 degrees of  internal rotation at 90 degrees of abduction   Baseline Supine in slight scaption 60 degrees 05/13/16; PROM 61 degrees in slight abduction on 05/19/16; able to attain <60 degrees in 60 degrees abd in standing 06/09/16   Status Partially Met     CC Long Term Goal  #4   Title Pt will be independent in a home exercise program for gentle stretching and strengthening per protocol   Status On-going            Plan - 06/15/16 1157    Clinical Impression Statement Gcode done this visit and her score has improved to 56.82%. Pt has been using her TENS unit and reports this has been helping her pain though she had it on throughout session today and she was having trouble relaxing with P/ROM. She repots did alot of moving things around in her workroom cleaning and rearraging and is sure she just flared it up with doing too much.  Pts hand was visibly increased in circumference today and as pt was having increased tihgtness with motion performed manual lymph drainage and discussed possibility of pt looking into getting a compression sleeve/glove as she has been noticing swelling in her hand more frequently.    Rehab Potential Good   Clinical Impairments Affecting Rehab Potential at home social situation   PT Frequency 3x / week   PT Duration 8 weeks   PT Treatment/Interventions Manual techniques;Passive  range of motion;Therapeutic exercise;Electrical Stimulation   PT Next Visit Plan Assess ROM/goals. Cont per protocol and pts tolerance to increase Lt shoulder ROM and strength. Have pt get a compression sleeve/glove   Consulted and Agree with Plan of Care Patient      Patient will benefit from skilled therapeutic intervention in order to improve the following deficits and impairments:  Increased edema, Decreased knowledge of precautions, Decreased range of motion, Decreased strength, Impaired UE functional use, Increased fascial restricitons, Pain  Visit Diagnosis: Left shoulder pain, unspecified  chronicity  Stiffness of left shoulder, not elsewhere classified  Localized edema     Problem List Patient Active Problem List   Diagnosis Date Noted  . S/P shoulder replacement 03/04/2016  . Constipation 12/16/2014  . Chemotherapy-induced nausea 12/16/2014  . Hyperglycemia 12/09/2014  . 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 Leukocytosis   . DVT (deep venous thrombosis) ([place]) 12/02/2014  . Osteoarthritis of both feet 11/26/2014  . Poor venous access   . Colorectal cancer ([place]) 10/03/2014  . Breast cancer of lower-inner quadrant of right female breast ([place]) 09/27/2014  . Severe obesity (BMI >= 40) ([place]) 11/06/2013  . Anemia, iron deficiency 04/25/2013  . GERD (gastroesophageal reflux disease) 02/12/2013  . HTN (hypertension) 10/21/2011  . Hypothyroidism 10/21/2011  . Depression 10/21/2011  . Hyperlipidemia 10/21/2011  . Lung nodules 10/21/2011  . Vertigo 10/21/2011  . Vitamin D deficiency 10/21/2011    Otelia Limes, PTA 06/15/2016, 12:10 PM  [place], [place], [address] Phone: [phone]   Fax:  [phone]  Name: RAYYA YAGI MRN: [id number] Date of Birth: August 04, 1951

## 2016-06-17 ENCOUNTER — Ambulatory Visit: Payer: Medicare Other | Attending: Orthopedic Surgery

## 2016-06-17 DIAGNOSIS — R600 Localized edema: Secondary | ICD-10-CM | POA: Diagnosis not present

## 2016-06-17 DIAGNOSIS — M25512 Pain in left shoulder: Secondary | ICD-10-CM

## 2016-06-17 DIAGNOSIS — M25612 Stiffness of left shoulder, not elsewhere classified: Secondary | ICD-10-CM | POA: Diagnosis not present

## 2016-06-17 NOTE — Therapy (Signed)
[place], [place], [address] Phone: [phone]   Fax:  [phone]  Physical Therapy Treatment  Patient Details  Name: Jordan [name] MRN: [id number] Date of Birth: 1950-11-08 Referring Provider: [name]  Encounter Date: 06/17/2016      PT End of Session - 06/17/16 1158    Visit Number 21   Number of Visits 24   Date for PT Re-Evaluation 06/25/16   PT Start Time 1103   PT Stop Time 1151   PT Time Calculation (min) 48 min   Activity Tolerance Patient tolerated treatment well   Behavior During Therapy [facility] for tasks assessed/performed      Past Medical History:  Diagnosis Date  . Anemia   . Arthritis    oa left shoulder  . Back pain    buldging disc  . Blood clot of neck vein   . Breast cancer ([place]) 10/17/2014   lower inner quadrant of the right breast  . Cancer ([place])    colon  . Constipation   . Depression    takes Wellbutrin daily  . Dizziness    r/t side effects from meds  . Family history of adverse reaction to anesthesia    It is hard for my mother to wake up from anesthesia.  . Generalized headaches    due to allergies, sinus  . GERD (gastroesophageal reflux disease)    takes Protonix as needed  . Hemorrhoids   . History of bronchitis    last time about 6-97yr ago  . History of colon polyps   . History of hiatal hernia   . History of migraine    last one about 15+yrs ago  . History of UTI   . Hx of seasonal allergies    takes OTC allergy med nightly  . Hyperlipidemia    takes Zetia and Zocor daily  . Hypertension    takes Maxzide and Metoprolol daily  . Hypothyroidism    takes Synthroid daily  . Joint pain   . Leg swelling   . Mass of colon   . Nasal congestion   . Nausea   . Pneumonia    walking about 6-762yrago  . PONV (postoperative nausea and vomiting)    pt states she is very easy to sedate  . S/P radiation therapy 03/11/2015 through  04/25/2015    Right breast 4500 cGy in 25 sessions, right breast boost 1600 cGy in 8 sessions   . Thyroid disease   . Tuberculosis    as little girl    . Vertigo    takes Meclizine prn  . Wears glasses   . Wears glasses     Past Surgical History:  Procedure Laterality Date  . ABDOMINAL HYSTERECTOMY    . APPENDECTOMY    . CARPAL TUNNEL RELEASE    [place]    . ESOPHAGOGASTRODUODENOSCOPY    . EXPLORATORY LAPAROTOMY    . KNEE SURGERY  1998   right - arthroscopic  . PARTIAL COLECTOMY  03/30/2012   Procedure: PARTIAL COLECTOMY;  Surgeon: JaGwenyth OberMD;  [facility] Service: General;  Laterality: N/A;  . PORT-A-CATH REMOVAL N/A 06/25/2015   Procedure: REMOVAL PORT-A-CATH;  Surgeon: PaAutumn MessingII, MD;  [facility]: [facility] Service: General;  Laterality: N/A;  . PORTACATH PLACEMENT    . RADIOACTIVE SEED GUIDED MASTECTOMY WITH AXILLARY SENTINEL LYMPH NODE BIOPSY Right 10/17/2014   Procedure: RADIOACTIVE  SEED GUIDED PARTIAL MASTECTOMY WITH AXILLARY SENTINEL LYMPH NODE BIOPSY;  Surgeon: [name], MD;  Location: [place];  Service: General;  Laterality: Right;  . TOTAL SHOULDER ARTHROPLASTY Left 03/04/2016   Procedure: LEFT TOTAL SHOULDER ARTHROPLASTY;  Surgeon: [name], MD;  Location: [place];  Service: Orthopedics;  Laterality: Left;    There were no vitals filed for this visit.      Subjective Assessment - 06/17/16 1110    Subjective I've been resting and using the TENS so my Rt shoulder feels alot better today.    Pertinent History Colon cancer 2013 with resection.  Breast cancer on right 2016 with chemo (finished July 2016) and radiation (finished early September 2016).  HTN (in flux--pt. will talk to PCP about that).  Has a RTC tear in left shoulder and two cysts there, diagnosed by orthopedist.  Blood clot in neck spring 2016.  Had an UE  Doppler  when the left arm swelling started and they found no evidence of blood clot for this episode.  Uses a cane for gait because of knee and ankle arthritis bilat. Orthopedist had her go ahead with rehab with [place] and wants to reassess in six weeks.   Currently in Pain? Yes   Pain Score 3    Pain Location Shoulder   Pain Orientation Left   Pain Descriptors / Indicators Aching   Pain Type Surgical pain   Pain Onset More than a month ago            [facility] PT Assessment - 06/17/16 0001      AROM   Left Shoulder Flexion 124 Degrees   Left Shoulder ABduction 90 Degrees                     [facility] Adult PT Treatment/Exercise - 06/17/16 0001      Elbow Exercises   Elbow Flexion Strengthening;Left;20 reps  2 sets of 10 reps   Bar Weights/Barbell (Elbow Flexion) 3 lbs     Shoulder Exercises: Standing   External Rotation Strengthening;Left;10 reps;Theraband   Theraband Level (Shoulder External Rotation) Level 2 (Red)   Theraband Level (Shoulder Internal Rotation) Level 2 (Red)     Shoulder Exercises: Pulleys   Flexion 3 minutes   ABduction 3 minutes   ABduction Limitations In about 60 degrees of scaption     Shoulder Exercises: Therapy Ball   Flexion 10 reps  Forward lean into top of stretch, 3-5 second holds   Flexion Limitations Rt hand on Lt to help push at end of ROM; 125 degrees flexion at end ROM     Shoulder Exercises: Isometric Strengthening   Flexion Other (comment)   Flexion Limitations Pt holding red theraband with arm at side and therapist walking away from pt 10 times   Extension Other (comment)   Extension Limitations Pt holding red theraband and therapist walking away from pt     Manual Therapy   Myofascial Release To Lt UE throughout P/ROM   Passive ROM Into Lt shoulder flexion, abduction and er to pts tolerance and with long holds at end of motion with deep breathing and VC to relax muscles  Pt wearing her TENS unit throughout entire session today                    Short Term Clinic Goals - 04/28/16 1335      CC Short Term Goal  #1   Title Pt to demonstrate 40 degrees  of PROM left shoulder ER in scapular plane 30 degrees   Status Achieved     CC Short Term Goal  #2   Title Pt to demonstate 90 degrees of flexion during AAROM exercises   Status Achieved     CC Short Term Goal  #3   Title Pt to demonstrate 30 degrees of PROM IR in scapular plane 45 degrees to side   Baseline unable to measure this yet per protocol 04/14/16; 60 degrees 04/28/16   Status Achieved             Long Term Clinic Goals - 06/09/16 1519      CC Long Term Goal  #1   Title Pt will demonstrate 160 degrees of flexion in supine   Baseline Supine P/ROM 93 degrees and painful 05/13/16; PROM 97 degrees on 05/19/16; PROM 106 degrees on 05/26/16; 116 degrees in standing 06/09/16   Status On-going     CC Long Term Goal  #2   Title Pt will demonstrate 75 degrees of external rotation in 90 degrees of abduction in supine   Baseline Supine P/ROM 40 degrees 05/13/16; PROM 42 degrees in slight abduction on 05/19/16; PROM 50 degrees in 45 degrees abduction on 06/04/16; 78 degrees in 60 degrees abd 06/09/16   Status Partially Met     CC Long Term Goal  #3   Title Pt will demonstrate 60 degrees of internal rotation at 90 degrees of abduction   Baseline Supine in slight scaption 60 degrees 05/13/16; PROM 61 degrees in slight abduction on 05/19/16; able to attain <60 degrees in 60 degrees abd in standing 06/09/16   Status Partially Met     CC Long Term Goal  #4   Title Pt will be independent in a home exercise program for gentle stretching and strengthening per protocol   Status On-going            Plan - 06/17/16 1159    Clinical Impression Statement Pt made great gains with her A/ROM measurements today. Her muscle tightness and guarding was also much improved allowing [place] to achieve increased P/ROM with stretching today as well. She wore her TENS unit  throughout session and reports this has been helping with pain reduction this week and during therapy. Pt reports her doctor will not approve more visits so D/C next week per POC. p   Rehab Potential Good   Clinical Impairments Affecting Rehab Potential at home social situation   PT Frequency 3x / week   PT Duration 8 weeks   PT Treatment/Interventions Manual techniques;Passive range of motion;Therapeutic exercise;Electrical Stimulation   PT Next Visit Plan Cont per protocol and pts tolerance to increase Lt shoulder ROM and strength. Have pt get a compression sleeve/glove; probable D/C next week as pt reports doctor will not approve more visits.   Consulted and Agree with Plan of Care Patient      Patient will benefit from skilled therapeutic intervention in order to improve the following deficits and impairments:  Increased edema, Decreased knowledge of precautions, Decreased range of motion, Decreased strength, Impaired UE functional use, Increased fascial restricitons, Pain  Visit Diagnosis: Left shoulder pain, unspecified chronicity  Stiffness of left shoulder, not elsewhere classified  Localized edema     Problem List Patient Active Problem List   Diagnosis Date Noted  . S/P shoulder replacement 03/04/2016  . Constipation 12/16/2014  . Chemotherapy-induced nausea 12/16/2014  . Hyperglycemia 12/09/2014  . Internal jugular vein thrombosis ([place])   . Leukocytosis   .  DVT (deep venous thrombosis) ([place]) 12/02/2014  . Osteoarthritis of both feet 11/26/2014  . Poor venous access   . Colorectal cancer ([place]) 10/03/2014  . Breast cancer of lower-inner quadrant of right female breast ([place]) 09/27/2014  . Severe obesity (BMI >= 40) ([place]) 11/06/2013  . Anemia, iron deficiency 04/25/2013  . GERD (gastroesophageal reflux disease) 02/12/2013  . HTN (hypertension) 10/21/2011  . Hypothyroidism 10/21/2011  . Depression 10/21/2011  . Hyperlipidemia 10/21/2011  . Lung nodules 10/21/2011  .  Vertigo 10/21/2011  . Vitamin D deficiency 10/21/2011    Otelia Limes, PTA 06/17/2016, 12:08 PM  [place], [place], [address] Phone: [phone]   Fax:  [phone]  Name: LYRICA MCCLARTY MRN: [id number] Date of Birth: 07-26-1951

## 2016-06-22 ENCOUNTER — Ambulatory Visit (INDEPENDENT_AMBULATORY_CARE_PROVIDER_SITE_OTHER): Payer: Medicare Other | Admitting: Physician Assistant

## 2016-06-22 ENCOUNTER — Encounter: Payer: Self-pay | Admitting: Physician Assistant

## 2016-06-22 ENCOUNTER — Ambulatory Visit: Payer: Medicare Other

## 2016-06-22 VITALS — BP 110/80 | HR 88 | Temp 98.4°F | Resp 17 | Ht 61.5 in | Wt 229.0 lb

## 2016-06-22 DIAGNOSIS — Z23 Encounter for immunization: Secondary | ICD-10-CM

## 2016-06-22 DIAGNOSIS — R739 Hyperglycemia, unspecified: Secondary | ICD-10-CM

## 2016-06-22 DIAGNOSIS — E785 Hyperlipidemia, unspecified: Secondary | ICD-10-CM

## 2016-06-22 DIAGNOSIS — E559 Vitamin D deficiency, unspecified: Secondary | ICD-10-CM

## 2016-06-22 DIAGNOSIS — D509 Iron deficiency anemia, unspecified: Secondary | ICD-10-CM | POA: Diagnosis not present

## 2016-06-22 DIAGNOSIS — I10 Essential (primary) hypertension: Secondary | ICD-10-CM

## 2016-06-22 DIAGNOSIS — Z6841 Body Mass Index (BMI) 40.0 and over, adult: Secondary | ICD-10-CM | POA: Diagnosis not present

## 2016-06-22 DIAGNOSIS — E039 Hypothyroidism, unspecified: Secondary | ICD-10-CM

## 2016-06-22 LAB — CBC WITH DIFFERENTIAL/PLATELET
BASOS PCT: 0 %
Basophils Absolute: 0 cells/uL (ref 0–200)
EOS ABS: 213 {cells}/uL (ref 15–500)
Eosinophils Relative: 3 %
HEMATOCRIT: 46.8 % — AB (ref 35.0–45.0)
Hemoglobin: 15.4 g/dL (ref 11.7–15.5)
LYMPHS PCT: 33 %
Lymphs Abs: 2343 cells/uL (ref 850–3900)
MCH: 28.3 pg (ref 27.0–33.0)
MCHC: 32.9 g/dL (ref 32.0–36.0)
MCV: 85.9 fL (ref 80.0–100.0)
MONO ABS: 355 {cells}/uL (ref 200–950)
MPV: 9.8 fL (ref 7.5–12.5)
Monocytes Relative: 5 %
NEUTROS ABS: 4189 {cells}/uL (ref 1500–7800)
Neutrophils Relative %: 59 %
PLATELETS: 258 10*3/uL (ref 140–400)
RBC: 5.45 MIL/uL — AB (ref 3.80–5.10)
RDW: 15.2 % — AB (ref 11.0–15.0)
WBC: 7.1 10*3/uL (ref 3.8–10.8)

## 2016-06-22 LAB — LIPID PANEL
CHOLESTEROL: 214 mg/dL — AB (ref <200)
HDL: 88 mg/dL (ref >50)
LDL Cholesterol: 105 mg/dL — ABNORMAL HIGH
Total CHOL/HDL Ratio: 2.4 Ratio (ref <5.0)
Triglycerides: 107 mg/dL (ref <150)
VLDL: 21 mg/dL (ref <30)

## 2016-06-22 LAB — TSH: TSH: 0.8 mIU/L

## 2016-06-22 NOTE — Patient Instructions (Signed)
     IF you received an x-ray today, you will receive an invoice from [facility]. Please contact [facility] at [phone] with questions or concerns regarding your invoice.   IF you received labwork today, you will receive an invoice from [facility]. Please contact [facility] at [phone] with questions or concerns regarding your invoice.   Our billing staff will not be able to assist you with questions regarding bills from these companies.  You will be contacted with the lab results as soon as they are available. The fastest way to get your results is to activate your My Chart account. Instructions are located on the last page of this paperwork. If you have not heard from us regarding the results in 2 weeks, please contact this office.      
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
## 2016-06-23 LAB — VITAMIN D 25 HYDROXY (VIT D DEFICIENCY, FRACTURES): VIT D 25 HYDROXY: 29 ng/mL — AB (ref 30–100)

## 2016-06-23 LAB — HEMOGLOBIN A1C
Hgb A1c MFr Bld: 5.7 % — ABNORMAL HIGH (ref <5.7)
MEAN PLASMA GLUCOSE: 117 mg/dL

## 2016-06-24 ENCOUNTER — Ambulatory Visit: Payer: Medicare Other

## 2016-06-24 DIAGNOSIS — M25612 Stiffness of left shoulder, not elsewhere classified: Secondary | ICD-10-CM

## 2016-06-24 DIAGNOSIS — M25512 Pain in left shoulder: Secondary | ICD-10-CM | POA: Diagnosis not present

## 2016-06-24 DIAGNOSIS — R600 Localized edema: Secondary | ICD-10-CM

## 2016-06-24 NOTE — Patient Instructions (Signed)
Cancer Rehab [id number]  Strengthening: Resisted Internal Rotation   Hold tubing in left hand, elbow at side and forearm out. Rotate forearm in across body. Repeat __15__ times per set. Do _1-2___ sets per session. Do __2-3__ sessions per day.  http://orth.exer.us/830   Copyright  VHI. All rights reserved.  Strengthening: Resisted External Rotation   Hold tubing in right hand, elbow at side and forearm across body. Rotate forearm out keeping a 90 degree angle at elbow (pretend you're sliding forearm across tabletop) Repeat ____ times per set. Do ____ sets per session. Do ____ sessions per day.  http://orth.exer.us/828   Copyright  VHI. All rights reserved.  Strengthening: Resisted Flexion   Hold tubing with left arm at side. Pull forward and up. Move shoulder through pain-free range of motion. Repeat _15___ times per set. Do __1-2__ sets per session. Do _2-3___ sessions per day.  http://orth.exer.us/824   Copyright  VHI. All rights reserved.  Strengthening: Resisted Extension   Hold tubing in right hand, arm forward. Pull arm back, elbow straight. Repeat __15__ times per set. Do _1-2___ sets per session. Do __2-3__ sessions per day.  http://orth.exer.us/832   Copyright  VHI. All rights reserved.   Standing 3 way raises:  10 times, then progress to 2 sets of 10 times. When able to do that with minimal difficulty progress to 1 lb weights and drop back to 1 set of 10 and progress from there.  1. Raise arms in front  2. Raise arms to side a little wider in a "V" 3. Raise arms out to side in a "T"

## 2016-06-24 NOTE — Therapy (Signed)
[place], [place], [address] Phone: [phone]   Fax:  [phone]  Physical Therapy Treatment  Patient Details  Name: [place] [name] MRN: [id number] Date of Birth: 02-15-1951 Referring Provider: [name]  Encounter Date: 06/24/2016      PT End of Session - 06/24/16 1103    Visit Number 22   Number of Visits 24   Date for PT Re-Evaluation 06/25/16   PT Start Time 1016   PT Stop Time 1101   PT Time Calculation (min) 45 min   Activity Tolerance Patient tolerated treatment well   Behavior During Therapy [facility] for tasks assessed/performed      Past Medical History:  Diagnosis Date  . Anemia   . Arthritis    oa left shoulder  . Back pain    buldging disc  . Blood clot of neck vein   . Breast cancer ([place]) 10/17/2014   lower inner quadrant of the right breast  . Cancer ([place])    colon  . Constipation   . Depression    takes Wellbutrin daily  . Dizziness    r/t side effects from meds  . Family history of adverse reaction to anesthesia    It is hard for my mother to wake up from anesthesia.  . Generalized headaches    due to allergies, sinus  . GERD (gastroesophageal reflux disease)    takes Protonix as needed  . Hemorrhoids   . History of bronchitis    last time about 6-82yr ago  . History of colon polyps   . History of hiatal hernia   . History of migraine    last one about 15+yrs ago  . History of UTI   . Hx of seasonal allergies    takes OTC allergy med nightly  . Hyperlipidemia    takes Zetia and Zocor daily  . Hypertension    takes Maxzide and Metoprolol daily  . Hypothyroidism    takes Synthroid daily  . Joint pain   . Leg swelling   . Mass of colon   . Nasal congestion   . Nausea   . Pneumonia    walking about 6-733yrago  . PONV (postoperative nausea and vomiting)    pt states she is very easy to sedate  . S/P radiation therapy 03/11/2015 through  04/25/2015    Right breast 4500 cGy in 25 sessions, right breast boost 1600 cGy in 8 sessions   . Thyroid disease   . Tuberculosis    as little girl    . Vertigo    takes Meclizine prn  . Wears glasses   . Wears glasses     Past Surgical History:  Procedure Laterality Date  . ABDOMINAL HYSTERECTOMY    . APPENDECTOMY    . CARPAL TUNNEL RELEASE    [place]    . ESOPHAGOGASTRODUODENOSCOPY    . EXPLORATORY LAPAROTOMY    . KNEE SURGERY  1998   right - arthroscopic  . PARTIAL COLECTOMY  03/30/2012   Procedure: PARTIAL COLECTOMY;  Surgeon: JaGwenyth OberMD;  [facility] Service: General;  Laterality: N/A;  . PORT-A-CATH REMOVAL N/A 06/25/2015   Procedure: REMOVAL PORT-A-CATH;  Surgeon: PaAutumn MessingII, MD;  [facility] Service: General;  Laterality: N/A;  . PORTACATH PLACEMENT    . RADIOACTIVE SEED GUIDED MASTECTOMY WITH AXILLARY SENTINEL LYMPH NODE BIOPSY Right 10/17/2014   Procedure: RADIOACTIVE  SEED GUIDED PARTIAL MASTECTOMY WITH AXILLARY SENTINEL LYMPH NODE BIOPSY;  Surgeon: [name], MD;  Location: [facility];  Service: General;  Laterality: Right;  . TOTAL SHOULDER ARTHROPLASTY Left 03/04/2016   Procedure: LEFT TOTAL SHOULDER ARTHROPLASTY;  Surgeon: [name], MD;  Location: [facility];  Service: Orthopedics;  Laterality: Left;    There were no vitals filed for this visit.      Subjective Assessment - 06/24/16 1018    Subjective Continuing to use the TENS so it's been helping my pain continue to improve.  Though it's hurting a little more today I think just because of the cold, rainy weather.   Pertinent History Colon cancer type 1 2013 with resection.  Breast cancer on right 2016 with chemo (finished July 2016) and radiation (finished early September 2016).  HTN (in flux--pt. will talk to PCP about that).  Has a RTC tear in left shoulder and two  cysts there, diagnosed by orthopedist.  Blood clot in neck spring 2016.  Had an UE  Doppler when the left arm swelling started and they found no evidence of blood clot for this episode.  Uses a cane for gait because of knee and ankle arthritis bilat. Orthopedist had her go ahead with rehab with [place] and wants to reassess in six weeks.   Currently in Pain? Yes   Pain Score 5    Pain Location Shoulder   Pain Orientation Left   Pain Descriptors / Indicators Aching   Pain Type Surgical pain   Pain Onset More than a month ago   Pain Frequency Intermittent   Aggravating Factors  The weather today isn't helping   Pain Relieving Factors eletrical stimulation            [facility] PT Assessment - 06/24/16 0001      AROM   Left Shoulder Flexion 146 Degrees   Left Shoulder ABduction 94 Degrees   Left Shoulder Internal Rotation 34 Degrees  About 80 degrees abd   Left Shoulder External Rotation 90 Degrees  About 60 degrees abd                     [facility] Adult PT Treatment/Exercise - 06/24/16 0001      Shoulder Exercises: Pulleys   Flexion 3 minutes   ABduction 3 minutes   ABduction Limitations In about 60 degrees of scaption                PT Education - 06/24/16 1058    Education provided Yes   Education Details [place] with yellow theraband and standing 3 way raises    Person(s) Educated Patient   Methods Explanation;Demonstration;Handout   Comprehension Verbalized understanding;Returned demonstration           Short Term Clinic Goals - 04/28/16 1335      CC Short Term Goal  #1   Title Pt to demonstrate 40 degrees of PROM left shoulder ER in scapular plane 30 degrees   Status Achieved     CC Short Term Goal  #2   Title Pt to demonstate 90 degrees of flexion during AAROM exercises   Status Achieved     CC Short Term Goal  #3   Title Pt to demonstrate 30 degrees of PROM IR in scapular plane 45 degrees to side   Baseline unable to measure this yet per  protocol 04/14/16; 60 degrees 04/28/16   Status Achieved  [facility] Goals - 06/24/16 1052      CC Long Term Goal  #1   Title Pt will demonstrate 160 degrees of flexion in supine   Baseline Supine P/ROM 93 degrees and painful 05/13/16; PROM 97 degrees on 05/19/16; PROM 106 degrees on 05/26/16; 116 degrees in standing 06/09/16; 146 degrees in standing 06/24/16   Status Partially Met     CC Long Term Goal  #2   Title Pt will demonstrate 75 degrees of external rotation in 90 degrees of abduction in supine   Baseline Supine P/ROM 40 degrees 05/13/16; PROM 42 degrees in slight abduction on 05/19/16; PROM 50 degrees in 45 degrees abduction on 06/04/16; 78 degrees in 60 degrees abd 06/09/16; 90 degrees in standing in 60 degrees abd   Status Partially Met     CC Long Term Goal  #3   Title Pt will demonstrate 60 degrees of internal rotation at 90 degrees of abduction   Baseline Supine in slight scaption 60 degrees 05/13/16; PROM 61 degrees in slight abduction on 05/19/16; able to attain <60 degrees in 60 degrees abd in standing 06/09/16; 34 degrees in standing in 80 degrees abd   Status Not Met     CC Long Term Goal  #4   Title Pt will be independent in a home exercise program for gentle stretching and strengthening per protocol   Status Achieved            Plan - 06/24/16 1111    Clinical Impression Statement Pt made great gains again this week from last week meausurements. Progressed her HEP today to include yellow theraband for [place] which she tolerated well with no pain, and standing 3 way raises which she could only do with 1 lb weight with flexion, scaption and abduction no weight. Pt did well with verbalizing understanding of being cautious to progress while being mindful of pain. Pt will be D/C to her HEP at this time.   Rehab Potential Good   Clinical Impairments Affecting Rehab Potential at home social situation   PT Frequency 3x / week   PT Duration 8 weeks    PT Treatment/Interventions Manual techniques;Passive range of motion;Therapeutic exercise;Electrical Stimulation   PT Next Visit Plan D/C this visit.    PT Home Exercise Plan see education section   Recommended Other Services Faxed order to Dr. Jana Hakim for compression garments for Lt UE, will call pt when this is returned to [place]   Consulted and Agree with Plan of Care Patient      Patient will benefit from skilled therapeutic intervention in order to improve the following deficits and impairments:  Increased edema, Decreased knowledge of precautions, Decreased range of motion, Decreased strength, Impaired UE functional use, Increased fascial restricitons, Pain  Visit Diagnosis: Left shoulder pain, unspecified chronicity  Stiffness of left shoulder, not elsewhere classified  Localized edema     Problem List Patient Active Problem List   Diagnosis Date Noted  . BMI 40.0-44.9, adult ([place]) 06/22/2016  . S/P shoulder replacement 03/04/2016  . Constipation 12/16/2014  . Chemotherapy-induced nausea 12/16/2014  . Hyperglycemia 12/09/2014  . Internal jugular vein thrombosis ([place])   . Leukocytosis   . DVT (deep venous thrombosis) ([place]) 12/02/2014  . Osteoarthritis of both feet 11/26/2014  . Poor venous access   . Colorectal cancer ([place]) 10/03/2014  . Breast cancer of lower-inner quadrant of right female breast ([place]) 09/27/2014  . Severe obesity (BMI >= 40) ([place]) 11/06/2013  . Anemia, iron deficiency 04/25/2013  .  GERD (gastroesophageal reflux disease) 02/12/2013  . HTN (hypertension) 10/21/2011  . Hypothyroidism 10/21/2011  . Depression 10/21/2011  . Hyperlipidemia 10/21/2011  . Lung nodules 10/21/2011  . Vertigo 10/21/2011  . Vitamin D deficiency 10/21/2011    Otelia Limes, PTA 06/24/2016, 12:18 PM  [place], [place], [address] Phone: [phone]   Fax:  [phone]  Name: YARITHZA MINK MRN: [id number] Date of Birth: [birth year]  PHYSICAL THERAPY DISCHARGE SUMMARY  Visits from Start of Care: 22  Current functional level related to goals / functional outcomes: See above  Remaining deficits: Pt still has decreased ROM and did not meet all ROM goals. She has been educated on proper way to progress home exercise program to continue to progress with regards to ROM   Education / Equipment: HEP Plan: Patient agrees to discharge.  Patient goals were partially met. Patient is being discharged due to the physician's request.  ?????    Allyson Sabal, PT 06/24/16 1:08 PM

## 2016-06-29 ENCOUNTER — Encounter: Payer: Self-pay | Admitting: Physician Assistant

## 2016-07-01 ENCOUNTER — Encounter: Payer: BC Managed Care – PPO | Admitting: Physical Therapy

## 2016-07-17 ENCOUNTER — Other Ambulatory Visit: Payer: Self-pay | Admitting: Physician Assistant

## 2016-07-17 DIAGNOSIS — E039 Hypothyroidism, unspecified: Secondary | ICD-10-CM

## 2016-07-18 NOTE — Telephone Encounter (Signed)
06/2016 last ov and lab Nl tsh

## 2016-07-19 ENCOUNTER — Other Ambulatory Visit: Payer: Self-pay | Admitting: Physician Assistant

## 2016-07-19 DIAGNOSIS — E039 Hypothyroidism, unspecified: Secondary | ICD-10-CM

## 2016-07-19 DIAGNOSIS — C50311 Malignant neoplasm of lower-inner quadrant of right female breast: Secondary | ICD-10-CM | POA: Diagnosis not present

## 2016-07-19 NOTE — Telephone Encounter (Signed)
12/2015 last ov 06/2016 last nl tsh

## 2016-08-10 ENCOUNTER — Other Ambulatory Visit: Payer: Self-pay | Admitting: Nurse Practitioner

## 2016-08-11 ENCOUNTER — Other Ambulatory Visit: Payer: Self-pay | Admitting: Oncology

## 2016-08-11 DIAGNOSIS — Z853 Personal history of malignant neoplasm of breast: Secondary | ICD-10-CM

## 2016-08-22 ENCOUNTER — Other Ambulatory Visit: Payer: Self-pay | Admitting: Physician Assistant

## 2016-08-22 DIAGNOSIS — F329 Major depressive disorder, single episode, unspecified: Secondary | ICD-10-CM

## 2016-08-22 DIAGNOSIS — F32A Depression, unspecified: Secondary | ICD-10-CM

## 2016-08-23 NOTE — Telephone Encounter (Signed)
SS refill req for Wellbutrin To [place]

## 2016-08-25 NOTE — Telephone Encounter (Signed)
Meds ordered this encounter  Medications  . buPROPion (WELLBUTRIN XL) 150 MG 24 hr tablet    Sig: TAKE 1 TABLET BY MOUTH EVERY DAY    Dispense:  90 tablet    Refill:  1

## 2016-08-26 IMAGING — MG MM SCREEN MAMMOGRAM BILATERAL
4 series · 4 of 4 positions shown · non-contrast
Comparison: Previous exam(s)

CLINICAL DATA: Screening.

EXAM:
DIGITAL SCREENING BILATERAL MAMMOGRAM WITH CAD

[R CC]
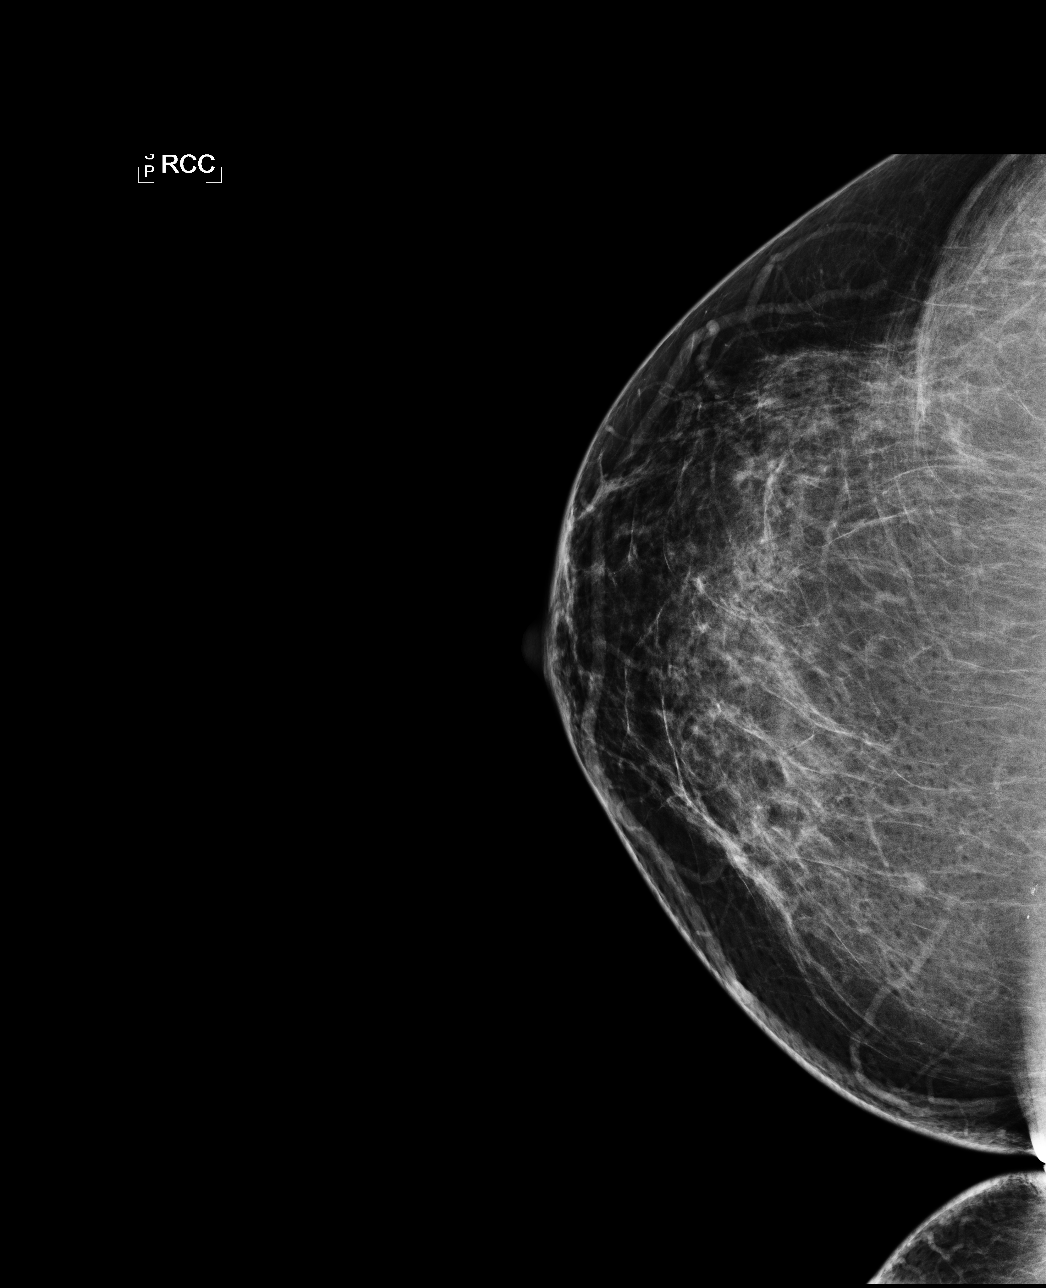

[L CC]
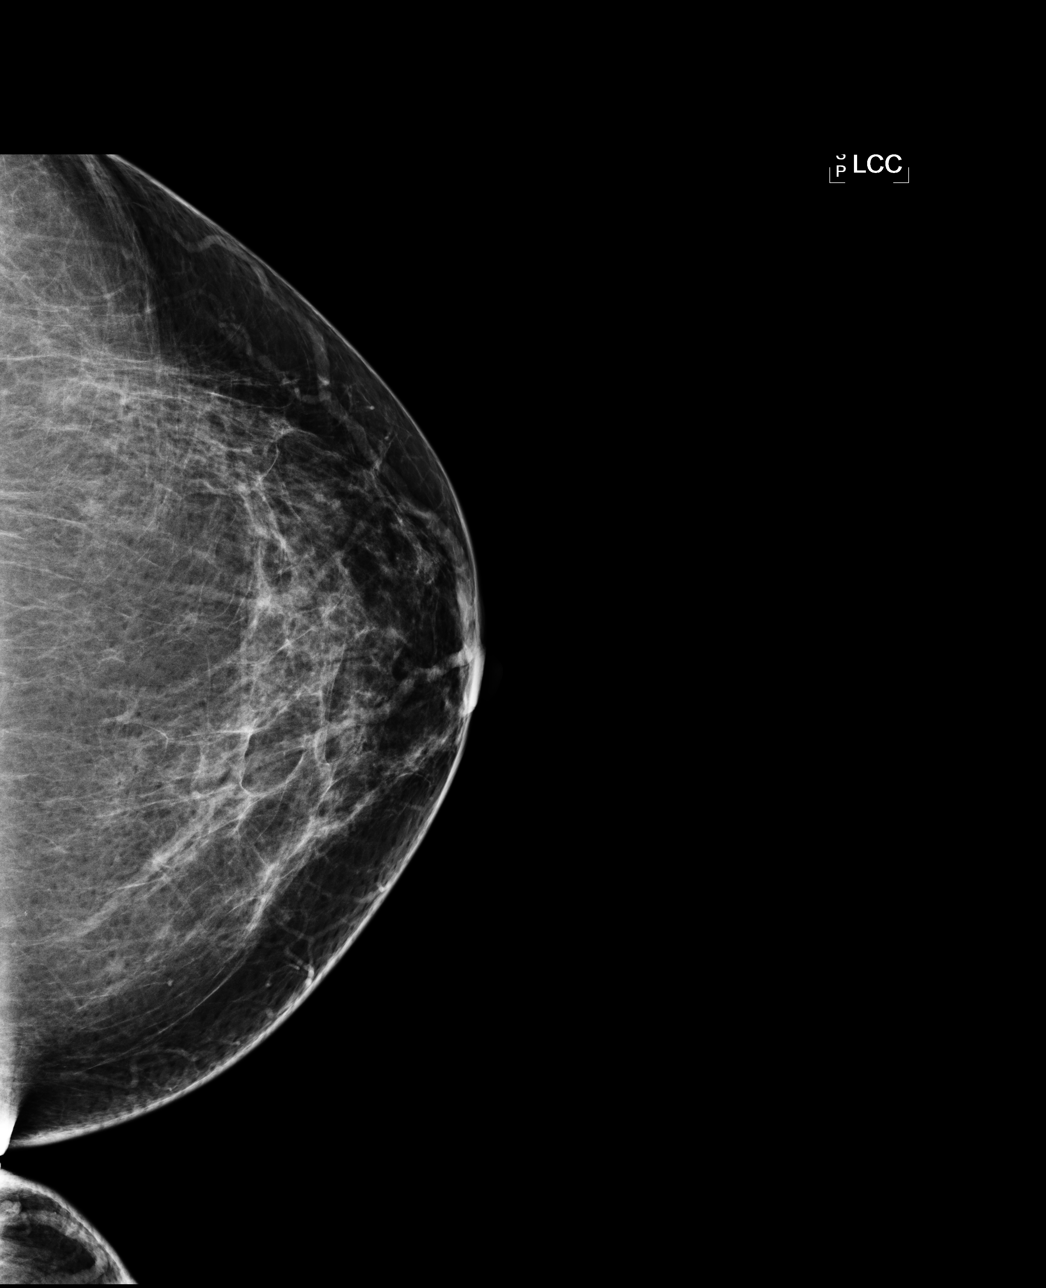

[L MLO]
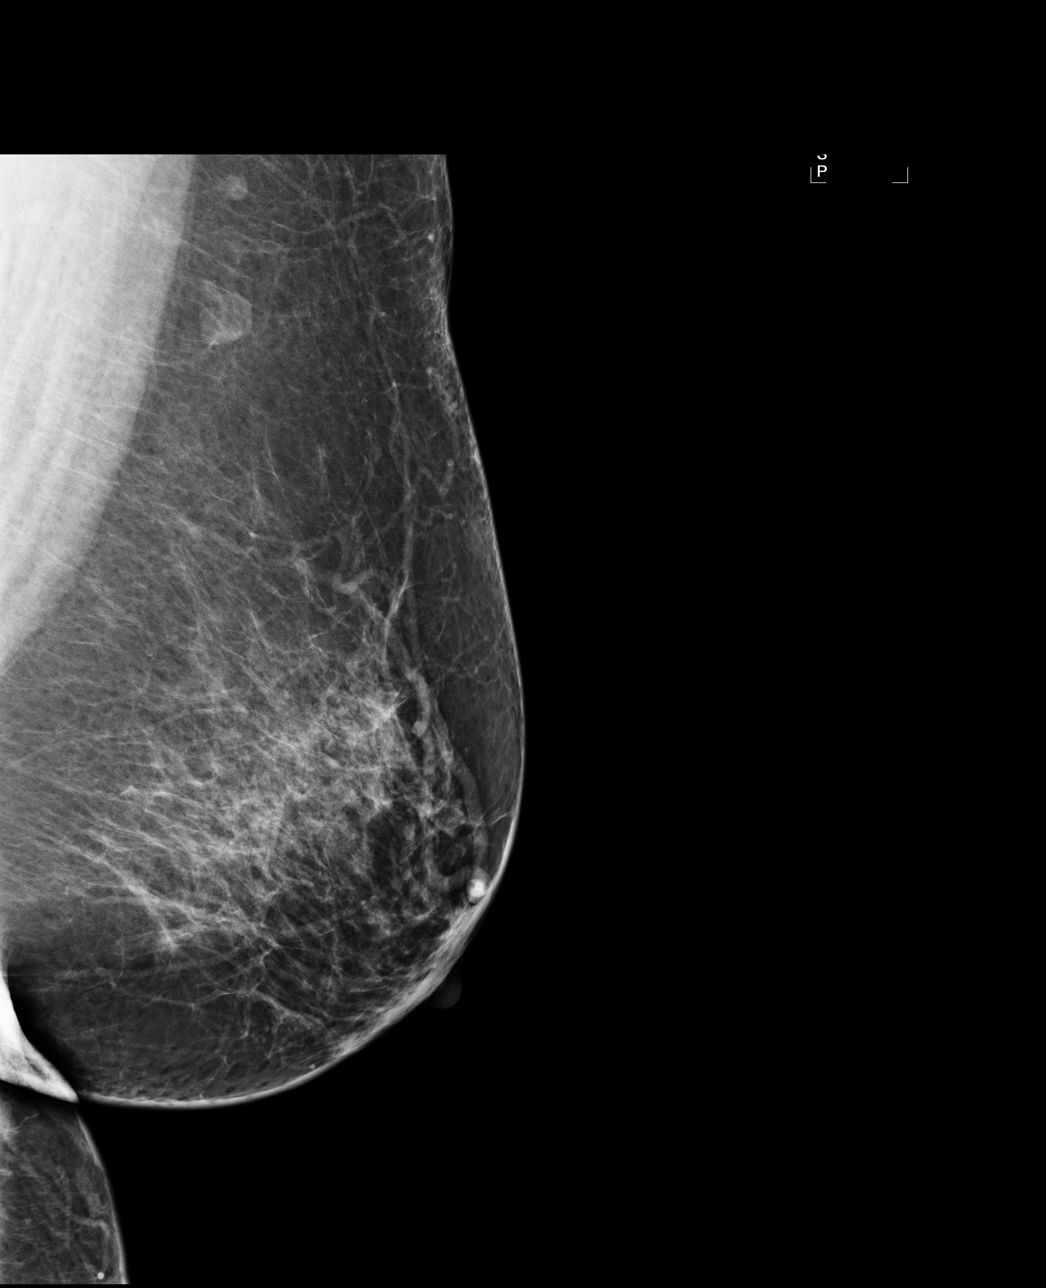

[R MLO]
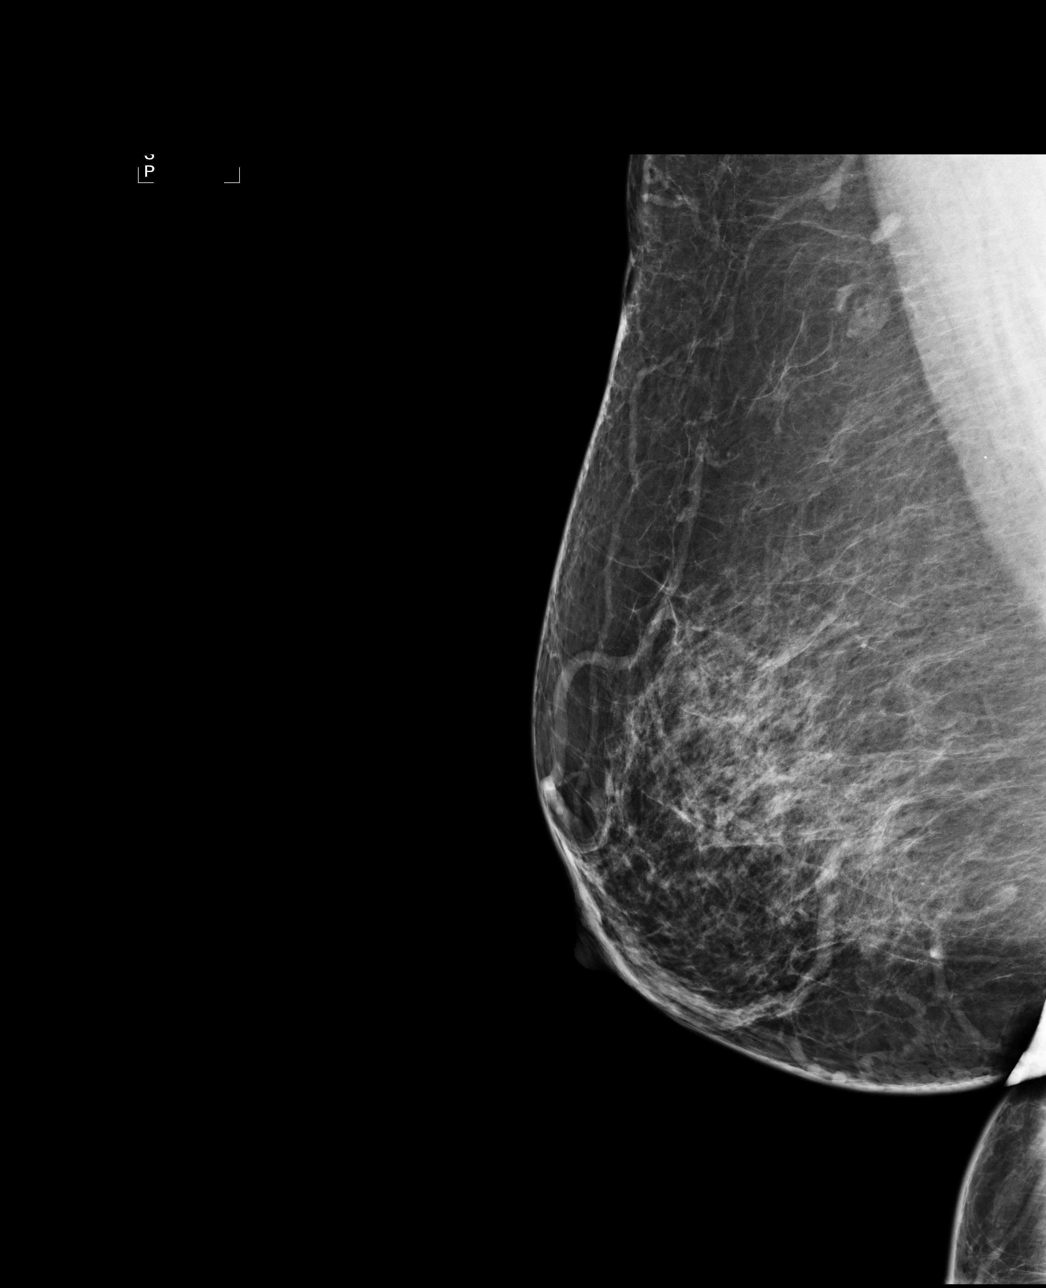

[4 of 4 positions shown; findings below may reference images not displayed]

ACR Breast Density Category b: There are scattered areas of
fibroglandular density.
FINDINGS: In the right breast, a possible mass warrants further evaluation
with spot compression views and possibly ultrasound. In the left
breast, no findings suspicious for malignancy. Images were processed
with CAD.
IMPRESSION: Further evaluation is suggested for possible mass in the right
breast.

RECOMMENDATION:
Diagnostic mammogram and possibly ultrasound of the right breast.
(Code:KG-[id number])

The patient will be contacted regarding the findings, and additional
imaging will be scheduled.

BI-RADS CATEGORY  0: Incomplete. Need additional imaging evaluation
and/or prior mammograms for comparison.

## 2016-09-08 ENCOUNTER — Other Ambulatory Visit: Payer: Self-pay | Admitting: Oncology

## 2016-09-08 ENCOUNTER — Ambulatory Visit
Admission: RE | Admit: 2016-09-08 | Discharge: 2016-09-08 | Disposition: A | Discharge status: Home / Self Care | Payer: Medicare Other | Source: Ambulatory Visit | Attending: Oncology | Admitting: Oncology

## 2016-09-08 DIAGNOSIS — Z853 Personal history of malignant neoplasm of breast: Secondary | ICD-10-CM

## 2016-09-08 DIAGNOSIS — R928 Other abnormal and inconclusive findings on diagnostic imaging of breast: Secondary | ICD-10-CM | POA: Diagnosis not present

## 2016-09-08 DIAGNOSIS — N6489 Other specified disorders of breast: Secondary | ICD-10-CM | POA: Diagnosis not present

## 2016-09-12 ENCOUNTER — Other Ambulatory Visit: Payer: Self-pay | Admitting: Physician Assistant

## 2016-09-12 DIAGNOSIS — M19072 Primary osteoarthritis, left ankle and foot: Principal | ICD-10-CM

## 2016-09-12 DIAGNOSIS — M19071 Primary osteoarthritis, right ankle and foot: Secondary | ICD-10-CM

## 2016-09-26 ENCOUNTER — Other Ambulatory Visit: Payer: Self-pay | Admitting: Physician Assistant

## 2016-09-26 DIAGNOSIS — E785 Hyperlipidemia, unspecified: Secondary | ICD-10-CM

## 2016-09-30 ENCOUNTER — Other Ambulatory Visit: Payer: Self-pay | Admitting: Physician Assistant

## 2016-09-30 DIAGNOSIS — I10 Essential (primary) hypertension: Secondary | ICD-10-CM

## 2016-11-08 ENCOUNTER — Other Ambulatory Visit: Payer: Self-pay | Admitting: Adult Health

## 2016-11-08 DIAGNOSIS — C50311 Malignant neoplasm of lower-inner quadrant of right female breast: Secondary | ICD-10-CM

## 2016-11-08 DIAGNOSIS — Z171 Estrogen receptor negative status [ER-]: Principal | ICD-10-CM

## 2016-11-09 ENCOUNTER — Other Ambulatory Visit (HOSPITAL_BASED_OUTPATIENT_CLINIC_OR_DEPARTMENT_OTHER): Payer: BC Managed Care – PPO

## 2016-11-09 ENCOUNTER — Ambulatory Visit (HOSPITAL_BASED_OUTPATIENT_CLINIC_OR_DEPARTMENT_OTHER): Payer: Medicare Other | Admitting: Oncology

## 2016-11-09 VITALS — BP 134/72 | HR 83 | Temp 98.0°F | Resp 18 | Ht 61.5 in | Wt 229.9 lb

## 2016-11-09 DIAGNOSIS — Z853 Personal history of malignant neoplasm of breast: Secondary | ICD-10-CM | POA: Diagnosis not present

## 2016-11-09 DIAGNOSIS — C19 Malignant neoplasm of rectosigmoid junction: Secondary | ICD-10-CM

## 2016-11-09 DIAGNOSIS — Z86718 Personal history of other venous thrombosis and embolism: Secondary | ICD-10-CM | POA: Diagnosis not present

## 2016-11-09 DIAGNOSIS — Z171 Estrogen receptor negative status [ER-]: Principal | ICD-10-CM

## 2016-11-09 DIAGNOSIS — C50311 Malignant neoplasm of lower-inner quadrant of right female breast: Secondary | ICD-10-CM

## 2016-11-09 LAB — CBC WITH DIFFERENTIAL/PLATELET
BASO%: 0.6 % (ref 0.0–2.0)
Basophils Absolute: 0 10*3/uL (ref 0.0–0.1)
EOS%: 2.9 % (ref 0.0–7.0)
Eosinophils Absolute: 0.2 10*3/uL (ref 0.0–0.5)
HCT: 44.4 % (ref 34.8–46.6)
HGB: 14.5 g/dL (ref 11.6–15.9)
LYMPH%: 30.6 % (ref 14.0–49.7)
MCH: 28.4 pg (ref 25.1–34.0)
MCHC: 32.7 g/dL (ref 31.5–36.0)
MCV: 86.8 fL (ref 79.5–101.0)
MONO#: 0.5 10*3/uL (ref 0.1–0.9)
MONO%: 6.3 % (ref 0.0–14.0)
NEUT%: 59.6 % (ref 38.4–76.8)
NEUTROS ABS: 4.5 10*3/uL (ref 1.5–6.5)
Platelets: 242 10*3/uL (ref 145–400)
RBC: 5.12 10*6/uL (ref 3.70–5.45)
RDW: 15.1 % — ABNORMAL HIGH (ref 11.2–14.5)
WBC: 7.6 10*3/uL (ref 3.9–10.3)
lymph#: 2.3 10*3/uL (ref 0.9–3.3)

## 2016-11-09 LAB — COMPREHENSIVE METABOLIC PANEL
ALT: 18 U/L (ref 0–55)
AST: 19 U/L (ref 5–34)
Albumin: 3.4 g/dL — ABNORMAL LOW (ref 3.5–5.0)
Alkaline Phosphatase: 68 U/L (ref 40–150)
Anion Gap: 9 mEq/L (ref 3–11)
BILIRUBIN TOTAL: 0.85 mg/dL (ref 0.20–1.20)
BUN: 12.1 mg/dL (ref 7.0–26.0)
CHLORIDE: 110 meq/L — AB (ref 98–109)
CO2: 23 meq/L (ref 22–29)
Calcium: 9 mg/dL (ref 8.4–10.4)
Creatinine: 1 mg/dL (ref 0.6–1.1)
EGFR: 71 mL/min/{1.73_m2} — ABNORMAL LOW (ref >90)
GLUCOSE: 112 mg/dL (ref 70–140)
Potassium: 3.5 mEq/L (ref 3.5–5.1)
SODIUM: 142 meq/L (ref 136–145)
TOTAL PROTEIN: 6.6 g/dL (ref 6.4–8.3)

## 2016-11-09 NOTE — Progress Notes (Signed)
[place]  [phone] [phone]     ID: Jordan [name] DOB: 04-20-1951  MR#: [id number]  JYN#:[id number]  Patient Care Team: Harrison Mons, PA-C as PCP - General (Physician Assistant) [name], MD as Consulting Physician (General Surgery) Chauncey Cruel, MD as Consulting Physician (Oncology) Arloa Koh, MD as Consulting Physician (Radiation Oncology) Mauro Kaufmann, RN as Registered Nurse Rockwell Germany, RN as Registered Nurse Holley Bouche, NP as Nurse Practitioner (Nurse Practitioner) Sylvan Cheese, NP as Nurse Practitioner (Nurse Practitioner) [name], MD as Consulting Physician (Orthopedic Surgery) PCP: Harrison Mons, PA-C OTHER MD: [name] MD  CHIEF COMPLAINT: Early stage triple negative breast cancer  CURRENT TREATMENT: observation  BREAST CANCER HISTORY: From the original intake note:  "Jordan [name]" [facility] 2015 showing breast density category B. A possible mass in the right breast was noted and on Jerry 20 03/05/2015 the patient underwent right diagnostic mammography with right ultrasonography. Spot compression views confirmed a slightly irregular mass in the lower inner quadrant of the right breast. This was not palpable by physical exam. Ultrasound showed a small hypoechoic mass measuring 5 mm in the area in question. Ultrasound of the right axilla was unremarkable.  Biopsy of the mass in question fibrin 05/06/2015 showed ([id number]) and invasive ductal carcinoma, grade 2, estrogen and progesterone receptor negative, with an MIB-1 of 41%, and no HER-2 amplification.  Bilateral breast MRI was obtained 10/04/2014. Results are pending.  The patient's subsequent history is as detailed below   INTERVAL HISTORY:  Jordan [name] returns today for follow-up of her estrogen receptor negative breast cancer. Interval history is significant for her father's death  2022/11/05 2 this happen to be the patient's mother's birthday.  The patient's mother, now 23, is living with Jordan [name]. This pretty much takes all her time  REVIEW OF SYSTEMS: Jordan [name] has limited mobility because of arthritis in her knee and ankles. She uses a cane which is helpful. She does not otherwise exercise regularly. A detailed review of systems today was stable except as noted  PAST MEDICAL HISTORY: Past Medical History:  Diagnosis Date  . Anemia   . Arthritis    oa left shoulder  . Back pain    buldging disc  . Blood clot of neck vein   . Breast cancer ([place]) 10/17/2014   lower inner quadrant of the right breast  . Cancer ([place])    colon  . Constipation   . Depression    takes Wellbutrin daily  . Dizziness    r/t side effects from meds  . Family history of adverse reaction to anesthesia    It is hard for my mother to wake up from anesthesia.  . Generalized headaches    due to allergies, sinus  . GERD (gastroesophageal reflux disease)    takes Protonix as needed  . Hemorrhoids   . History of bronchitis    last time about 6-58yr ago  . History of colon polyps   . History of hiatal hernia   . History of migraine    last one about 15+yrs ago  . History of UTI   . Hx of seasonal allergies    takes OTC allergy med nightly  . Hyperlipidemia    takes Zetia and Zocor daily  . Hypertension    takes Maxzide and Metoprolol daily  . Hypothyroidism    takes Synthroid daily  . Joint pain   .  Leg swelling   . Mass of colon   . Nasal congestion   . Nausea   . Pneumonia    walking about 6-63yr ago  . PONV (postoperative nausea and vomiting)    pt states she is very easy to sedate  . S/P radiation therapy 03/11/2015 through 04/25/2015    Right breast 4500 cGy in 25 sessions, right breast boost 1600 cGy in 8 sessions   . Thyroid disease   . Tuberculosis    as little girl    . Vertigo    takes Meclizine  prn  . Wears glasses   . Wears glasses     PAST SURGICAL HISTORY: Past Surgical History:  Procedure Laterality Date  . ABDOMINAL HYSTERECTOMY    . APPENDECTOMY    . CARPAL TUNNEL RELEASE    [place]    . ESOPHAGOGASTRODUODENOSCOPY    . EXPLORATORY LAPAROTOMY    . KNEE SURGERY  1998   right - arthroscopic  . PARTIAL COLECTOMY  03/30/2012   Procedure: PARTIAL COLECTOMY;  Surgeon: JGwenyth Ober MD;  [facility]  Service: General;  Laterality: N/A;  . PORT-A-CATH REMOVAL N/A 06/25/2015   Procedure: REMOVAL PORT-A-CATH;  Surgeon: PAutumn MessingIII, MD;  [facility] [facility]  Service: General;  Laterality: N/A;  . PORTACATH PLACEMENT    . RADIOACTIVE SEED GUIDED MASTECTOMY WITH AXILLARY SENTINEL LYMPH NODE BIOPSY Right 10/17/2014   Procedure: RADIOACTIVE SEED GUIDED PARTIAL MASTECTOMY WITH AXILLARY SENTINEL LYMPH NODE BIOPSY;  Surgeon: PAutumn MessingIII, MD;  [facility]  Service: General;  Laterality: Right;  . TOTAL SHOULDER ARTHROPLASTY Left 03/04/2016   Procedure: LEFT TOTAL SHOULDER ARTHROPLASTY;  Surgeon: KJustice Britain MD;  [place] [facility]  Service: Orthopedics;  Laterality: Left;    FAMILY HISTORY Family History  Problem Relation Age of Onset  . Cancer Father 833   prostate  . Cancer Brother 535   colon  . Deep vein thrombosis Brother   . Cancer Maternal Aunt 464   breast  . Cancer Maternal Grandmother 753   breast  . Cancer Paternal Grandmother     colon  . Pulmonary embolism Brother     long-distance truck driver (PE x 2)  . Cancer Brother 610   colon  . Cancer Maternal Aunt 75    unknown type  . Mental illness Sister     history of Depression  . Seizures Sister 8    s/p traumatic brain injury  . Diabetes Maternal Grandfather   . Cancer Maternal Uncle     prostate   the patient's parents are still living, as of February 2016. Her father is 934years old, with a history of prostate cancer diagnosed in his late  723s The patient's mother is 826years old. The patient had 5 brothers, 3 sisters. One brother died with prostate cancer at age 38170 Another brother has a history of prostate cancer, age 38162 One brother had colon cancer diagnosed age 8939as did a paternal grandmother. A maternal grandmother had breast cancer at age 3870as did a maternal aunt (diagnosed age 68) A cousin was diagnosed with breast cancer the age of 439  GYNECOLOGIC HISTORY:  No LMP recorded. Patient has had a hysterectomy. Menarche age 66 first live birth age 66 The patient is GX P1. She stopped having periods in 1981 when she underwent hysterectomy and unilateral salpingo-oophorectomy.. She did not take hormone replacement.  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 Her husband [name] owns a business that [employer] at night. The patient's biological son [name] works in Biomedical scientist in [place]. He has 2 children. One of them, the patient's granddaughter [name], 72 years old, lives with the patient, and is a Interior and spatial designer at [employer]. The patient also has an adopted son, [name], lives in [place]. Jordan [name] attends a city of refugee church in [place] where she grew up    ADVANCED DIRECTIVES: Not in place; at the 05/10/2016 visit the patient was given the appropriate forms to know her eyes and complete at her discretion. She tells me she intends to name her 70 year old granddaughter as her healthcare part of attorney  HEALTH MAINTENANCE: Social History  Substance Use Topics  . Smoking status: Never Smoker  . Smokeless tobacco: Never Used  . Alcohol use Yes     Comment: RARELY     Colonoscopy: 03/30/2013/[facility]  PAP:  Bone density:  Lipid panel:  No Known Allergies  Current Outpatient Prescriptions  Medication Sig Dispense Refill  . aspirin 81 MG tablet Take 81 mg by mouth daily.    Marland Kitchen buPROPion (WELLBUTRIN XL) 150 MG 24 hr tablet TAKE 1 TABLET BY MOUTH EVERY DAY 90 tablet 1  .  diphenhydrAMINE (BENADRYL) 25 MG tablet Take 25 mg by mouth daily as needed for allergies.    Marland Kitchen ezetimibe (ZETIA) 10 MG tablet Take 1 tablet (10 mg total) by mouth daily. (Patient not taking: Reported on 06/22/2016) 30 tablet 5  . levothyroxine (SYNTHROID, LEVOTHROID) 125 MCG tablet TAKE 1 TABLET BY MOUTH EVERY DAY 30 tablet 5  . levothyroxine (SYNTHROID, LEVOTHROID) 125 MCG tablet TAKE 1 TABLET BY MOUTH EVERY DAY 30 tablet 5  . meloxicam (MOBIC) 15 MG tablet TAKE 1 TABLET BY MOUTH EVERY DAY 30 tablet 5  . nitroGLYCERIN (NITROSTAT) 0.4 MG SL tablet Place 1 tablet (0.4 mg total) under the tongue every 5 (five) minutes as needed for chest pain. 30 tablet 1  . ondansetron (ZOFRAN) 4 MG tablet Take 1 tablet (4 mg total) by mouth every 8 (eight) hours as needed for nausea or vomiting. 20 tablet 0  . oxyCODONE-acetaminophen (PERCOCET) 5-325 MG tablet Take 1-2 tablets by mouth every 4 (four) hours as needed. 60 tablet 0  . Prenatal Multivit-Min-Fe-FA (PRENATAL VITAMINS PO) Take 1 tablet by mouth daily.     . simvastatin (ZOCOR) 40 MG tablet TAKE 1 TABLET BY MOUTH AT BEDTIME. 30 tablet 2  . triamterene-hydrochlorothiazide (MAXZIDE-25) 37.5-25 MG tablet TAKE 1 TABLET BY MOUTH EVERY DAY 90 tablet 0   No current facility-administered medications for this visit.    Facility-Administered Medications Ordered in Other Visits  Medication Dose Route Frequency Provider Last Rate Last Dose  . chlorhexidine (HIBICLENS) 4 % liquid 1 application  1 application Topical Once [name], MD        OBJECTIVE: Middle-aged African-American woman in no acute distress  Vitals:   11/09/16 1146  BP: 134/72  Pulse: 83  Resp: 18  Temp: 98 F (36.7 C)     Body mass index is 42.74 kg/m.    ECOG FS:1 - Symptomatic but completely ambulatory  Sclerae unicteric, pupils round and equal Oropharynx clear and moist No cervical or supraclavicular adenopathy Lungs no rales or rhonchi Heart regular rate and rhythm Abd soft,  nontender, positive bowel sounds MSK no focal spinal tenderness, no upper extremity lymphedema Neuro: nonfocal, well oriented, appropriate affect Breasts the right breast has  undergone lumpectomy followed by radiation with no evidence of local recurrence. The left breast is unremarkable. Both axillae are benign.    R ESULTS:  CMP     Component Value Date/Time   NA 142 11/09/2016 1112   K 3.5 11/09/2016 1112   CL 108 03/02/2016 0951   CO2 23 11/09/2016 1112   GLUCOSE 112 11/09/2016 1112   BUN 12.1 11/09/2016 1112   CREATININE 1.0 11/09/2016 1112   CALCIUM 9.0 11/09/2016 1112   PROT 6.6 11/09/2016 1112   ALBUMIN 3.4 (L) 11/09/2016 1112   AST 19 11/09/2016 1112   ALT 18 11/09/2016 1112   ALKPHOS 68 11/09/2016 1112   BILITOT 0.85 11/09/2016 1112   GFRNONAA 56 (L) 03/02/2016 0951   GFRAA >60 03/02/2016 0951    INo results found for: SPEP, UPEP  Lab Results  Component Value Date   WBC 7.6 11/09/2016   NEUTROABS 4.5 11/09/2016   HGB 14.5 11/09/2016   HCT 44.4 11/09/2016   MCV 86.8 11/09/2016   PLT 242 11/09/2016      Chemistry      Component Value Date/Time   NA 142 11/09/2016 1112   K 3.5 11/09/2016 1112   CL 108 03/02/2016 0951   CO2 23 11/09/2016 1112   BUN 12.1 11/09/2016 1112   CREATININE 1.0 11/09/2016 1112      Component Value Date/Time   CALCIUM 9.0 11/09/2016 1112   ALKPHOS 68 11/09/2016 1112   AST 19 11/09/2016 1112   ALT 18 11/09/2016 1112   BILITOT 0.85 11/09/2016 1112       No results found for: LABCA2  No components found for: LABCA125  No results for input(s): INR in the last 168 hours.  Urinalysis    Component Value Date/Time   COLORURINE YELLOW 03/09/2011 0857   APPEARANCEUR CLOUDY (A) 03/09/2011 0857   LABSPEC 1.020 11/10/2015 1152   PHURINE 6.0 11/10/2015 1152   PHURINE 5.5 03/09/2011 0857   GLUCOSEU Negative 11/10/2015 1152   HGBUR Negative 11/10/2015 1152   HGBUR NEGATIVE 03/09/2011 0857   BILIRUBINUR Negative 11/10/2015 1152    KETONESUR Negative 11/10/2015 1152   KETONESUR NEGATIVE 03/09/2011 0857   PROTEINUR Negative 11/10/2015 1152   PROTEINUR NEGATIVE 03/09/2011 0857   UROBILINOGEN 0.2 11/10/2015 1152   NITRITE Negative 11/10/2015 1152   NITRITE NEGATIVE 03/09/2011 0857   LEUKOCYTESUR Small 11/10/2015 1152    STUDIES: Mammography at [facility] 09/08/2016. The breast density to be category B. There was a 3.7 cm mass associated with the lumpectomy site, which by ultrasound his seroma measuring 2.3 cm located at the 3:30 o'clock radian 3 cm from the nipple   ASSESSMENT: 66 y.o.   woman status post right breast lower inner quadrant biopsy 09/24/2014 for a clinical T1a N0, stage IA invasive ductal carcinoma, grade 1 or 2, estrogen and progesterone receptor negative, with an MIB-1 of 41%, and HER-2 negative   (1) status post right lumpectomy and axillary sentinel lymph node sampling 10/17/2014 for a pT1b pN0, stage IA  invasive ductal carcinoma, grade 3, repeat HER-2 again negative.   (2) adjuvant chemotherapy started 12/09/2014, consisting of cyclophosphamide and docetaxel given every 3 weeks 4, completed 02/10/2015  (a) the docetaxel dose was decreased after the first cycle because of elevations in transaminases.   (3) adjuvant radiation started 03/18/2015  (4) status post partial right colectomy with lymphadenectomy 03/30/2012 for a 2.7 cm tubular adenoma with high-grade dysplasia, no evidence of invasion, 0 of 11 lymph nodes involved ([id number])  (  5) genetics testing sent 10/04/2014 through the [facility] gene panel /[facility] found no deleterious mutations in ATM, BARD1, BRCA1, BRCA2, BRIP1, CDH1, CHEK2, EPCAM, MLH1, MRE11A, MSH2, MSH6, MUTYH, NBN, NF1, PALB2, PMS2, PTEN, RAD50, RAD51C, RAD51D, SMARCA4, STK11, or TP53  (a) two variants of uncertain significance were found    (i) ATM, p.[id number]   (ii) SMARCA4, p.[id number]  (6) left IJ DVT 12/02/14, started on xarelto  (a) left upper  extremity Doppler ultrasonography 03/28/2015 was negative: also shows left IJ clear  (b) xarelto discontinued September 2016  (7) elevated blood glucose. A1c on 12/03/14 was 6.4.  PLAN: Jordan [name] is now 2 years out from definitive surgery for her estrogen receptor negative breast cancer, with no evidence of disease recurrence. This is very favorable.  We discussed the fact that estrogen negative breast cancers if they're going to return to Dr. Alonna Minium. Accordingly completing 2 years of follow-up with no recurrence is especially favorable   We also discussed the fact that her breast density now was category B. This means your mammograms will be very sensitive and if anything develops we should be able tocatch  She will see me again in one year. She knows to call for any problems that may develop before that visit.   is Now a year and a half out from definitive surgery for her breast cancer, with no evidence of disease recurrence. This is very favorable.  She continues on observation alone. She will see her surgeon in 3 months. She will return to see me in 6 months. At that time she will be 2 years out from her surgery and I will start seeing her on a once a year basis.  Today I gave her a copy of the living will and health Department attorney documents to complete at her discretion.   She knows to call for any problems that may develop before her next visit here.    Chauncey Cruel, MD   11/09/2016 12:08 PM

## 2016-12-07 ENCOUNTER — Encounter: Payer: Self-pay | Admitting: Physician Assistant

## 2016-12-07 ENCOUNTER — Ambulatory Visit (INDEPENDENT_AMBULATORY_CARE_PROVIDER_SITE_OTHER): Payer: Medicare Other | Admitting: Physician Assistant

## 2016-12-07 VITALS — BP 130/84 | HR 90 | Temp 98.8°F | Resp 16 | Ht 61.5 in | Wt 220.3 lb

## 2016-12-07 DIAGNOSIS — I10 Essential (primary) hypertension: Secondary | ICD-10-CM | POA: Diagnosis not present

## 2016-12-07 DIAGNOSIS — E785 Hyperlipidemia, unspecified: Secondary | ICD-10-CM | POA: Diagnosis not present

## 2016-12-07 DIAGNOSIS — R739 Hyperglycemia, unspecified: Secondary | ICD-10-CM

## 2016-12-07 DIAGNOSIS — E039 Hypothyroidism, unspecified: Secondary | ICD-10-CM

## 2016-12-07 DIAGNOSIS — E559 Vitamin D deficiency, unspecified: Secondary | ICD-10-CM

## 2016-12-07 DIAGNOSIS — I890 Lymphedema, not elsewhere classified: Secondary | ICD-10-CM | POA: Diagnosis not present

## 2016-12-07 MED ORDER — TRIAMTERENE-HCTZ 37.5-25 MG PO TABS: 1.0000 | ORAL_TABLET | Freq: Every day | ORAL | 3 refills | Status: DC
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
## 2016-12-07 NOTE — Assessment & Plan Note (Signed)
Update labs today. Adjust levothyroxine dose as needed.

## 2016-12-07 NOTE — Assessment & Plan Note (Signed)
Controlled. Tolerating treatment. Continue.

## 2016-12-07 NOTE — Assessment & Plan Note (Signed)
A1C has been well-controlled. Update today.

## 2016-12-07 NOTE — Patient Instructions (Signed)
     IF you received an x-ray today, you will receive an invoice from [facility]. Please contact [facility] at [phone] with questions or concerns regarding your invoice.   IF you received labwork today, you will receive an invoice from [facility]. Please contact [facility] at [phone] with questions or concerns regarding your invoice.   Our billing staff will not be able to assist you with questions regarding bills from these companies.  You will be contacted with the lab results as soon as they are available. The fastest way to get your results is to activate your My Chart account. Instructions are located on the last page of this paperwork. If you have not heard from us regarding the results in 2 weeks, please contact this office.     

## 2016-12-07 NOTE — Assessment & Plan Note (Signed)
Update level today. 

## 2016-12-07 NOTE — Assessment & Plan Note (Signed)
Await lab results. Consider: alternate statin, Niaspan, Welchol.

## 2016-12-08 LAB — T4, FREE: FREE T4: 1.69 ng/dL (ref 0.82–1.77)

## 2016-12-08 LAB — LIPID PANEL
CHOL/HDL RATIO: 3 ratio (ref 0.0–4.4)
Cholesterol, Total: 242 mg/dL — ABNORMAL HIGH (ref 100–199)
HDL: 80 mg/dL (ref >39)
LDL Calculated: 137 mg/dL — ABNORMAL HIGH (ref 0–99)
TRIGLYCERIDES: 123 mg/dL (ref 0–149)
VLDL Cholesterol Cal: 25 mg/dL (ref 5–40)

## 2016-12-08 LAB — HEMOGLOBIN A1C
Est. average glucose Bld gHb Est-mCnc: 123 mg/dL
HEMOGLOBIN A1C: 5.9 % — AB (ref 4.8–5.6)

## 2016-12-08 LAB — TSH: TSH: 0.416 u[IU]/mL — AB (ref 0.450–4.500)

## 2016-12-08 LAB — VITAMIN D 25 HYDROXY (VIT D DEFICIENCY, FRACTURES): VIT D 25 HYDROXY: 29.8 ng/mL — AB (ref 30.0–100.0)

## 2016-12-13 ENCOUNTER — Ambulatory Visit: Payer: Medicare Other | Attending: Physician Assistant | Admitting: Physical Therapy

## 2016-12-13 DIAGNOSIS — M25612 Stiffness of left shoulder, not elsewhere classified: Secondary | ICD-10-CM | POA: Diagnosis not present

## 2016-12-13 DIAGNOSIS — I890 Lymphedema, not elsewhere classified: Secondary | ICD-10-CM

## 2016-12-13 DIAGNOSIS — M79602 Pain in left arm: Secondary | ICD-10-CM | POA: Insufficient documentation

## 2016-12-13 NOTE — Therapy (Signed)
[place], [place], [address] Phone: [phone]   Fax:  [phone]  Physical Therapy Evaluation  Patient Details  Name: Jordan [name] MRN: [id number] Date of Birth: Oct 27, 1950 Referring Provider: Harrison Mons, PA-C  Encounter Date: 12/13/2016      PT End of Session - 12/13/16 1228    Visit Number 1   Number of Visits 9   Date for PT Re-Evaluation 01/21/17   PT Start Time 0909   PT Stop Time 1008   PT Time Calculation (min) 59 min   Activity Tolerance Patient tolerated treatment well   Behavior During Therapy [facility] for tasks assessed/performed      Past Medical History:  Diagnosis Date  . Anemia   . Arthritis    oa left shoulder  . Back pain    buldging disc  . Blood clot of neck vein   . Breast cancer ([place]) 10/17/2014   lower inner quadrant of the right breast  . Cancer ([place])    colon  . Constipation   . Depression    takes Wellbutrin daily  . Dizziness    r/t side effects from meds  . Family history of adverse reaction to anesthesia    It is hard for my mother to wake up from anesthesia.  . Generalized headaches    due to allergies, sinus  . GERD (gastroesophageal reflux disease)    takes Protonix as needed  . Hemorrhoids   . History of bronchitis    last time about 6-62yrs ago  . History of colon polyps   . History of hiatal hernia   . History of migraine    last one about 15+yrs ago  . History of UTI   . Hx of seasonal allergies    takes OTC allergy med nightly  . Hyperlipidemia    takes Zetia and Zocor daily  . Hypertension    takes Maxzide and Metoprolol daily  . Hypothyroidism    takes Synthroid daily  . Joint pain   . Leg swelling   . Mass of colon   . Nasal congestion   . Nausea   . Pneumonia    walking about 6-42yrs ago  . PONV (postoperative nausea and vomiting)    pt states she is very easy to sedate  . S/P radiation therapy 03/11/2015 through  04/25/2015    Right breast 4500 cGy in 25 sessions, right breast boost 1600 cGy in 8 sessions   . Thyroid disease   . Tuberculosis    as little girl    . Vertigo    takes Meclizine prn  . Wears glasses   . Wears glasses     Past Surgical History:  Procedure Laterality Date  . ABDOMINAL HYSTERECTOMY    . APPENDECTOMY    . CARPAL TUNNEL RELEASE    . [place]  . COLONOSCOPY    . ESOPHAGOGASTRODUODENOSCOPY    . EXPLORATORY LAPAROTOMY    . KNEE SURGERY  1998   right - arthroscopic  . PARTIAL COLECTOMY  03/30/2012   Procedure: PARTIAL COLECTOMY;  Surgeon: [name], MD;  Location: [place];  Service: General;  Laterality: N/A;  . PORT-A-CATH REMOVAL N/A 06/25/2015   Procedure: REMOVAL PORT-A-CATH;  Surgeon: [name], MD;  Location: [place];  Service: General;  Laterality: N/A;  . PORTACATH PLACEMENT    . RADIOACTIVE SEED GUIDED MASTECTOMY WITH AXILLARY SENTINEL LYMPH NODE BIOPSY Right 10/17/2014   Procedure:  RADIOACTIVE SEED GUIDED PARTIAL MASTECTOMY WITH AXILLARY SENTINEL LYMPH NODE BIOPSY;  Surgeon: [name], MD;  Location: [place];  Service: General;  Laterality: Right;  . TOTAL SHOULDER ARTHROPLASTY Left 03/04/2016   Procedure: LEFT TOTAL SHOULDER ARTHROPLASTY;  Surgeon: [name], MD;  Location: [place];  Service: Orthopedics;  Laterality: Left;    There were no vitals filed for this visit.       Subjective Assessment - 12/13/16 0909    Subjective "I did not get moved." Father came to live with her and then passed away. She was busy cleaning and things and that made the left arm be aggravated.  Now her mother lives with her and her husband. Has a compression sleeve and glove, but it doesn't fit--she got it from a friend and didn't get one fitted for her.   Pertinent History Left breast cancer diagnosed January 2016 with lumpectomy, then chemo, then radiation  (finished all 04/23/15 approx.).  Left total shoulder replacement 03/04/16 approx.  HTN controlled with meds.  Cleaning and helping her mother transfer caused left arm aggravation, with whole arm swelling.   Patient Stated Goals get rid of the pain and swelling   Currently in Pain? Yes   Pain Score 0-No pain  up to 8/10   Pain Location Hand  and arm   Pain Orientation Left   Pain Descriptors / Indicators Other (Comment)  thumping, sometimes like a toothache   Pain Onset More than a month ago  10/06/16   Aggravating Factors  using the hand, worse as the day goes on   Pain Relieving Factors relaxing in a hot tub of water (self-manual lymph drainage isn't working)            [facility] PT Assessment - 12/13/16 0001      Assessment   Medical Diagnosis left UE lymphedema   Referring Provider Harrison Mons, PA-C   Onset Date/Surgical Date 10/06/16   Hand Dominance Left   Prior Therapy none     Precautions   Precautions Other (comment)   Precaution Comments cancer precautions; h/o left total shoulder replacement     Restrictions   Weight Bearing Restrictions No     Balance Screen   Has the patient fallen in the past 6 months No   Has the patient had a decrease in activity level because of a fear of falling?  Yes  but is very careful   Is the patient reluctant to leave their home because of a fear of falling?  Yes     [place] residence   Living Arrangements Spouse/significant other;Parent   Type of [place] Two level;Able to live on main level with bedroom/bathroom     Prior Function   Level of Independence Needs assistance with homemaking   Vocation Retired   Leisure was walking but hasn't been recently     [employer]   Overall Cognitive Status Within Functional Limits for tasks assessed     Observation/Other Assessments   Observations scar at left anterior shoulder from TSR   Other Surveys  --  lymph life impact scale  score of 45 = 70% impairment     ROM / Strength   AROM / PROM / Strength AROM     AROM   AROM Assessment Site Shoulder   Right/Left Shoulder Right;Left   Right Shoulder Flexion 150 Degrees   Right Shoulder ABduction 170 Degrees   Left Shoulder Flexion  119 Degrees   Left Shoulder ABduction 90 Degrees  painful           LYMPHEDEMA/ONCOLOGY QUESTIONNAIRE - 12/13/16 0934      Type   Cancer Type left breast     Surgeries   Lumpectomy Date 10/17/14   Number Lymph Nodes Removed 4     Treatment   Past Chemotherapy Treatment Yes   Past Radiation Treatment Yes   Date 04/23/15  approx.   Body Site left breast     Lymphedema Assessments   Lymphedema Assessments Upper extremities     Right Upper Extremity Lymphedema   15 cm Proximal to Olecranon Process 38.3 cm   10 cm Proximal to Olecranon Process 36 cm   Olecranon Process 28.3 cm   15 cm Proximal to Ulnar Styloid Process 29.3 cm   10 cm Proximal to Ulnar Styloid Process 26.1 cm   Just Proximal to Ulnar Styloid Process 18.8 cm   Across Hand at [employer] 20 cm   At [place] of 2nd Digit 6.9 cm     Left Upper Extremity Lymphedema   15 cm Proximal to Olecranon Process 39 cm   10 cm Proximal to Olecranon Process 37.6 cm   Olecranon Process 30.2 cm   15 cm Proximal to Ulnar Styloid Process 30.8 cm   10 cm Proximal to Ulnar Styloid Process 27.7 cm   Just Proximal to Ulnar Styloid Process 20.2 cm   Across Hand at [employer] 21.7 cm   At [place] of 2nd Digit 7.3 cm                [facility] Adult PT Treatment/Exercise - 12/13/16 0001      Manual Therapy   Manual Therapy Edema management;Manual Lymphatic Drainage (MLD)   Edema Management issued TG soft small for left arm for patient to wear for mild compression   Manual Lymphatic Drainage (MLD) Five diaphragmatic breaths, then short neck, right axilla and anterior interaxillary anastomosis, left groin and axillo-inguinal anastomosis, and left UE from fingers to  shoulder.                        [place] - 12/13/16 1234      CC Long Term Goal  #1   Title Left arm circumference at hand will reduce by 0.7 cm. to 21 cm.   Baseline 21.7 at eval   Time 4   Period Weeks   Status New     CC Long Term Goal  #2   Title Pt. will be independent in self-care for lymphedema.   Time 4   Period Weeks   Status New     CC Long Term Goal  #3   Title Patient will have been fitted for a compression sleeve and glove for left UE   Time 4   Period Weeks     CC Long Term Goal  #4   Title Pt. will report at least 50% decrease in left UE pain at the end of the day compared to initial eval today   Baseline up to 8/10 at eval   Time 4   Period Weeks   Status New     CC Long Term Goal  #5   Title lymphedema life impact scale score reduced to 35% impairment or less   Baseline score of 45 with 16 questions answered = 70% impairment at eval   Time 4   Period Weeks  Status New            Plan - 12/13/16 1229    Clinical Impression Statement Patient known to this clinic from previous episodes returns now with increased discomfort and swelling in left arm.  Circumference measurements do show a significant difference between left and right arms.  She has limited left shoulder A/ROM, in part due to a total shoulder replacement that she had done last year.  She has not gotten a well-fitting compression sleeve and glove for left UE yet, so needs that.  Eval is moderate complexity with comorbidities of left total shoulder replacement and personal factors including her caring for her 53 year-old mother at home, which involves using the left UE to assist with transfers.  Status is evolving with recent change in discomfort and swelling.   Rehab Potential Good   PT Frequency 2x / week   PT Duration 4 weeks   PT Treatment/Interventions ADLs/Self Care Home Management;Therapeutic exercise;Patient/family education;Orthotic Fit/Training;Manual  techniques;Manual lymph drainage;Compression bandaging;Passive range of motion;Taping   PT Next Visit Plan Check left shoulder P/ROM in all motions. Begin course of manual lymph drainage to left UE; later, have patient fitted for compression garments.  Include left shoulder ROM and strengthening as time permits.   Consulted and Agree with Plan of Care Patient      Patient will benefit from skilled therapeutic intervention in order to improve the following deficits and impairments:  Increased edema, Pain, Decreased strength, Decreased range of motion  Visit Diagnosis: Lymphedema, not elsewhere classified - Plan: PT plan of care cert/re-cert  Pain in left arm - Plan: PT plan of care cert/re-cert  Stiffness of left shoulder, not elsewhere classified - Plan: PT plan of care cert/re-cert      G-Codes - 37/48/27 1239    Functional Assessment Tool Used (Outpatient Only) lymphedema life impact scale   Functional Limitation Self care   Self Care Current Status (M7867) At least 60 percent but less than 80 percent impaired, limited or restricted   Self Care Goal Status (J4492) At least 20 percent but less than 40 percent impaired, limited or restricted       Problem List Patient Active Problem List   Diagnosis Date Noted  . Lymphedema of right upper extremity 12/07/2016  . BMI 40.0-44.9, adult ([place]) 06/22/2016  . S/P shoulder replacement 03/04/2016  . Constipation 12/16/2014  . Chemotherapy-induced nausea 12/16/2014  . Hyperglycemia 12/09/2014  . Internal jugular vein thrombosis ([place])   . Leukocytosis   . DVT (deep venous thrombosis) ([place]) 12/02/2014  . Osteoarthritis of both feet 11/26/2014  . Poor venous access   . Colorectal cancer ([place]) 10/03/2014  . Malignant neoplasm of lower-inner quadrant of right breast of female, estrogen receptor negative ([place]) 09/27/2014  . Anemia, iron deficiency 04/25/2013  . GERD (gastroesophageal reflux disease) 02/12/2013  . HTN (hypertension)  10/21/2011  . Hypothyroidism 10/21/2011  . Depression 10/21/2011  . Hyperlipidemia 10/21/2011  . Lung nodules 10/21/2011  . Vertigo 10/21/2011  . Vitamin D deficiency 10/21/2011    Jordan [name] 12/13/2016, 12:42 PM  [place], [place], [address] Phone: [phone]   Fax:  [phone]  Name: Jordan [name] MRN: [id number] Date of Birth: 03/13/1951  Serafina Royals, PT 12/13/16 12:42 PM

## 2016-12-16 ENCOUNTER — Ambulatory Visit: Payer: Medicare Other | Attending: Physician Assistant

## 2016-12-16 ENCOUNTER — Telehealth: Payer: Self-pay

## 2016-12-16 DIAGNOSIS — I890 Lymphedema, not elsewhere classified: Secondary | ICD-10-CM | POA: Insufficient documentation

## 2016-12-16 DIAGNOSIS — M79602 Pain in left arm: Secondary | ICD-10-CM | POA: Insufficient documentation

## 2016-12-16 DIAGNOSIS — M25512 Pain in left shoulder: Secondary | ICD-10-CM | POA: Insufficient documentation

## 2016-12-16 DIAGNOSIS — M25612 Stiffness of left shoulder, not elsewhere classified: Secondary | ICD-10-CM | POA: Insufficient documentation

## 2016-12-16 NOTE — Telephone Encounter (Signed)
Left a VM on pts cell to call us regarding her missed appointment today. Also tried contacting her at her home number but family memeber relayed she was not home at present so did not leave message there.

## 2016-12-22 ENCOUNTER — Ambulatory Visit: Payer: Medicare Other

## 2016-12-22 DIAGNOSIS — I890 Lymphedema, not elsewhere classified: Secondary | ICD-10-CM

## 2016-12-22 DIAGNOSIS — M25612 Stiffness of left shoulder, not elsewhere classified: Secondary | ICD-10-CM

## 2016-12-22 DIAGNOSIS — M25512 Pain in left shoulder: Secondary | ICD-10-CM | POA: Diagnosis not present

## 2016-12-22 DIAGNOSIS — M79602 Pain in left arm: Secondary | ICD-10-CM | POA: Diagnosis not present

## 2016-12-22 NOTE — Therapy (Signed)
[place], [place], [address] Phone: [phone]   Fax:  [phone]  Physical Therapy Treatment  Patient Details  Name: Jordan [name] MRN: [id number] Date of Birth: 04/13/1951 Referring Provider: Harrison Mons, PA-C  Encounter Date: 12/22/2016      PT End of Session - 12/22/16 1159    Visit Number 2   Number of Visits 9   Date for PT Re-Evaluation 01/21/17   PT Start Time 1106   PT Stop Time 1151   PT Time Calculation (min) 45 min   Activity Tolerance Patient tolerated treatment well   Behavior During Therapy [facility] for tasks assessed/performed      Past Medical History:  Diagnosis Date  . Anemia   . Arthritis    oa left shoulder  . Back pain    buldging disc  . Blood clot of neck vein   . Breast cancer ([place]) 10/17/2014   lower inner quadrant of the right breast  . Cancer ([place])    colon  . Constipation   . Depression    takes Wellbutrin daily  . Dizziness    r/t side effects from meds  . Family history of adverse reaction to anesthesia    It is hard for my mother to wake up from anesthesia.  . Generalized headaches    due to allergies, sinus  . GERD (gastroesophageal reflux disease)    takes Protonix as needed  . Hemorrhoids   . History of bronchitis    last time about 6-35yrs ago  . History of colon polyps   . History of hiatal hernia   . History of migraine    last one about 15+yrs ago  . History of UTI   . Hx of seasonal allergies    takes OTC allergy med nightly  . Hyperlipidemia    takes Zetia and Zocor daily  . Hypertension    takes Maxzide and Metoprolol daily  . Hypothyroidism    takes Synthroid daily  . Joint pain   . Leg swelling   . Mass of colon   . Nasal congestion   . Nausea   . Pneumonia    walking about 6-32yrs ago  . PONV (postoperative nausea and vomiting)    pt states she is very easy to sedate  . S/P radiation therapy 03/11/2015 through  04/25/2015    Right breast 4500 cGy in 25 sessions, right breast boost 1600 cGy in 8 sessions   . Thyroid disease   . Tuberculosis    as little girl    . Vertigo    takes Meclizine prn  . Wears glasses   . Wears glasses     Past Surgical History:  Procedure Laterality Date  . ABDOMINAL HYSTERECTOMY    . APPENDECTOMY    . CARPAL TUNNEL RELEASE    . [place]  . COLONOSCOPY    . ESOPHAGOGASTRODUODENOSCOPY    . EXPLORATORY LAPAROTOMY    . KNEE SURGERY  1998   right - arthroscopic  . PARTIAL COLECTOMY  03/30/2012   Procedure: PARTIAL COLECTOMY;  Surgeon: [name], MD;  Location: [place];  Service: General;  Laterality: N/A;  . PORT-A-CATH REMOVAL N/A 06/25/2015   Procedure: REMOVAL PORT-A-CATH;  Surgeon: [name], MD;  Location: [place];  Service: General;  Laterality: N/A;  . PORTACATH PLACEMENT    . RADIOACTIVE SEED GUIDED MASTECTOMY WITH AXILLARY SENTINEL LYMPH NODE BIOPSY Right 10/17/2014   Procedure:  RADIOACTIVE SEED GUIDED PARTIAL MASTECTOMY WITH AXILLARY SENTINEL LYMPH NODE BIOPSY;  Surgeon: [name], MD;  Location: [place];  Service: General;  Laterality: Right;  . TOTAL SHOULDER ARTHROPLASTY Left 03/04/2016   Procedure: LEFT TOTAL SHOULDER ARTHROPLASTY;  Surgeon: [name], MD;  Location: [place];  Service: Orthopedics;  Laterality: Left;    There were no vitals filed for this visit.      Subjective Assessment - 12/22/16 1111    Subjective I'm sorry I got my appointment time mixed up last time. I'm ready to get my arm smaller. My Lt hand isn't hurting right now. I've been wearing the soft sleeve Butch Penny gave me last time and I think it's helped control the swelling some.   Pertinent History Left breast cancer diagnosed January 2016 with lumpectomy, then chemo, then radiation (finished all 04/23/15 approx.).  Left total shoulder replacement 03/04/16 approx.  HTN  controlled with meds.  Cleaning and helping her mother transfer caused left arm aggravation, with whole arm swelling.   Patient Stated Goals get rid of the pain and swelling   Currently in Pain? Yes   Pain Score 4    Pain Location Hip   Pain Orientation Left   Pain Descriptors / Indicators Nagging   Pain Type Acute pain   Pain Onset More than a month ago   Aggravating Factors  walking, doing more with my mom living with [place]   Pain Relieving Factors sitting down            [facility] PT Assessment - 12/22/16 0001      ROM / Strength   AROM / PROM / Strength PROM     PROM   PROM Assessment Site Shoulder   Right/Left Shoulder Left   Left Shoulder Flexion 145 Degrees   Left Shoulder ABduction 103 Degrees   Left Shoulder Internal Rotation 79 Degrees   Left Shoulder External Rotation 54 Degrees                     [facility] Adult PT Treatment/Exercise - 12/22/16 0001      Manual Therapy   Manual Therapy Edema management;Manual Lymphatic Drainage (MLD);Passive ROM   Edema Management Pt came in wearing TG soft issued last visit, redonned this for pt after session.   Manual Lymphatic Drainage (MLD) Five diaphragmatic breaths, then short neck, right axilla and anterior interaxillary anastomosis, left groin and axillo-inguinal anastomosis, and left UE from fingers to shoulder working from proximal to distal then retracing all steps.    Passive ROM Briefly at end of session for Lt shoulder into flexion, abduction, and er to pts tolerance.                        [place] - 12/13/16 1234      CC Long Term Goal  #1   Title Left arm circumference at hand will reduce by 0.7 cm. to 21 cm.   Baseline 21.7 at eval   Time 4   Period Weeks   Status New     CC Long Term Goal  #2   Title Pt. will be independent in self-care for lymphedema.   Time 4   Period Weeks   Status New     CC Long Term Goal  #3   Title Patient will have been fitted for a  compression sleeve and glove for left UE   Time 4   Period Weeks  CC Long Term Goal  #4   Title Pt. will report at least 50% decrease in left UE pain at the end of the day compared to initial eval today   Baseline up to 8/10 at eval   Time 4   Period Weeks   Status New     CC Long Term Goal  #5   Title lymphedema life impact scale score reduced to 35% impairment or less   Baseline score of 45 with 16 questions answered = 70% impairment at eval   Time 4   Period Weeks   Status New            Plan - 12/22/16 1207    Clinical Impression Statement Pt tolerated first session of manual lymph drainage well today. She reports has noticed wearing the T soft sleeve has helped reduce her fluid some and wants to look into getting a compression sleeve. will fax order today ot Harrison Mons, PA that referred pt to [place]. P/ROM is still very painful at pts end ROM's, especially with abduction. She reports she has been continuing to do her HEP from her last encounter of therapy here with [place]. She is just having trouble increasing her end ROM.   Rehab Potential Good   PT Frequency 2x / week   PT Duration 4 weeks   PT Treatment/Interventions ADLs/Self Care Home Management;Therapeutic exercise;Patient/family education;Orthotic Fit/Training;Manual techniques;Manual lymph drainage;Compression bandaging;Passive range of motion;Taping   PT Next Visit Plan Cont manual lymph drainage to left UE; Have patient fitted for compression garments once order returns signed.  Include left shoulder ROM and strengthening as time permits.   Recommended Other Services Faxed order today to Harrison Mons, PA for compression garments   Consulted and Agree with Plan of Care Patient      Patient will benefit from skilled therapeutic intervention in order to improve the following deficits and impairments:  Increased edema, Pain, Decreased strength, Decreased range of motion  Visit Diagnosis: Lymphedema, not elsewhere  classified  Pain in left arm  Stiffness of left shoulder, not elsewhere classified  Left shoulder pain, unspecified chronicity     Problem List Patient Active Problem List   Diagnosis Date Noted  . Lymphedema of right upper extremity 12/07/2016  . BMI 40.0-44.9, adult ([place]) 06/22/2016  . S/P shoulder replacement 03/04/2016  . Constipation 12/16/2014  . Chemotherapy-induced nausea 12/16/2014  . Hyperglycemia 12/09/2014  . Internal jugular vein thrombosis ([place])   . Leukocytosis   . DVT (deep venous thrombosis) ([place]) 12/02/2014  . Osteoarthritis of both feet 11/26/2014  . Poor venous access   . Colorectal cancer ([place]) 10/03/2014  . Malignant neoplasm of lower-inner quadrant of right breast of female, estrogen receptor negative ([place]) 09/27/2014  . Anemia, iron deficiency 04/25/2013  . GERD (gastroesophageal reflux disease) 02/12/2013  . HTN (hypertension) 10/21/2011  . Hypothyroidism 10/21/2011  . Depression 10/21/2011  . Hyperlipidemia 10/21/2011  . Lung nodules 10/21/2011  . Vertigo 10/21/2011  . Vitamin D deficiency 10/21/2011    Otelia Limes, PTA 12/22/2016, 12:12 PM  [place], [place], [address] Phone: [phone]   Fax:  [phone]  Name: SYMPHONI HELBLING MRN: [id number] Date of Birth: 01-Jul-1951

## 2016-12-24 ENCOUNTER — Ambulatory Visit: Payer: Medicare Other

## 2016-12-24 DIAGNOSIS — M25512 Pain in left shoulder: Secondary | ICD-10-CM

## 2016-12-24 DIAGNOSIS — M25612 Stiffness of left shoulder, not elsewhere classified: Secondary | ICD-10-CM

## 2016-12-24 DIAGNOSIS — M79602 Pain in left arm: Secondary | ICD-10-CM | POA: Diagnosis not present

## 2016-12-24 DIAGNOSIS — I890 Lymphedema, not elsewhere classified: Secondary | ICD-10-CM

## 2016-12-24 NOTE — Therapy (Signed)
[place], [place], [address] Phone: [phone]   Fax:  [phone]  Physical Therapy Treatment  Patient Details  Name: Jordan [name] MRN: [id number] Date of Birth: Oct 20, 1950 Referring Provider: Harrison Mons, PA-C  Encounter Date: 12/24/2016      PT End of Session - 12/24/16 1103    Visit Number 3   Number of Visits 9   Date for PT Re-Evaluation 01/21/17   PT Start Time 1019   PT Stop Time 1103   PT Time Calculation (min) 44 min   Activity Tolerance Patient tolerated treatment well   Behavior During Therapy [facility] for tasks assessed/performed      Past Medical History:  Diagnosis Date  . Anemia   . Arthritis    oa left shoulder  . Back pain    buldging disc  . Blood clot of neck vein   . Breast cancer ([place]) 10/17/2014   lower inner quadrant of the right breast  . Cancer ([place])    colon  . Constipation   . Depression    takes Wellbutrin daily  . Dizziness    r/t side effects from meds  . Family history of adverse reaction to anesthesia    It is hard for my mother to wake up from anesthesia.  . Generalized headaches    due to allergies, sinus  . GERD (gastroesophageal reflux disease)    takes Protonix as needed  . Hemorrhoids   . History of bronchitis    last time about 6-58yrs ago  . History of colon polyps   . History of hiatal hernia   . History of migraine    last one about 15+yrs ago  . History of UTI   . Hx of seasonal allergies    takes OTC allergy med nightly  . Hyperlipidemia    takes Zetia and Zocor daily  . Hypertension    takes Maxzide and Metoprolol daily  . Hypothyroidism    takes Synthroid daily  . Joint pain   . Leg swelling   . Mass of colon   . Nasal congestion   . Nausea   . Pneumonia    walking about 6-31yrs ago  . PONV (postoperative nausea and vomiting)    pt states she is very easy to sedate  . S/P radiation therapy 03/11/2015 through  04/25/2015    Right breast 4500 cGy in 25 sessions, right breast boost 1600 cGy in 8 sessions   . Thyroid disease   . Tuberculosis    as little girl    . Vertigo    takes Meclizine prn  . Wears glasses   . Wears glasses     Past Surgical History:  Procedure Laterality Date  . ABDOMINAL HYSTERECTOMY    . APPENDECTOMY    . CARPAL TUNNEL RELEASE    . [place]  . COLONOSCOPY    . ESOPHAGOGASTRODUODENOSCOPY    . EXPLORATORY LAPAROTOMY    . KNEE SURGERY  1998   right - arthroscopic  . PARTIAL COLECTOMY  03/30/2012   Procedure: PARTIAL COLECTOMY;  Surgeon: [name], MD;  Location: [place];  Service: General;  Laterality: N/A;  . PORT-A-CATH REMOVAL N/A 06/25/2015   Procedure: REMOVAL PORT-A-CATH;  Surgeon: [name], MD;  Location: [place];  Service: General;  Laterality: N/A;  . PORTACATH PLACEMENT    . RADIOACTIVE SEED GUIDED MASTECTOMY WITH AXILLARY SENTINEL LYMPH NODE BIOPSY Right 10/17/2014   Procedure:  RADIOACTIVE SEED GUIDED PARTIAL MASTECTOMY WITH AXILLARY SENTINEL LYMPH NODE BIOPSY;  Surgeon: [name], MD;  Location: [place];  Service: General;  Laterality: Right;  . TOTAL SHOULDER ARTHROPLASTY Left 03/04/2016   Procedure: LEFT TOTAL SHOULDER ARTHROPLASTY;  Surgeon: [name], MD;  Location: [place];  Service: Orthopedics;  Laterality: Left;    There were no vitals filed for this visit.      Subjective Assessment - 12/24/16 1035    Subjective Nothing new since last visit.    Pertinent History Left breast cancer diagnosed January 2016 with lumpectomy, then chemo, then radiation (finished all 04/23/15 approx.).  Left total shoulder replacement 03/04/16 approx.  HTN controlled with meds.  Cleaning and helping her mother transfer caused left arm aggravation, with whole arm swelling.   Patient Stated Goals get rid of the pain and swelling   Currently in Pain? No/denies                          [facility] Adult PT Treatment/Exercise - 12/24/16 0001      Shoulder Exercises: Pulleys   Flexion 2 minutes   ABduction 2 minutes     Shoulder Exercises: Therapy Ball   Flexion 10 reps  with forward lean into end of stretch     Shoulder Exercises: ROM/Strengthening   Other ROM/Strengthening Exercises Finger Ladder for Lt UE abduction x 7      Manual Therapy   Manual Therapy Edema management;Manual Lymphatic Drainage (MLD);Passive ROM   Manual Lymphatic Drainage (MLD) Five diaphragmatic breaths, then short neck, right axilla nodes and anterior interaxillary anastomosis, left inguinal nodes and axillo-inguinal anastomosis, and left UE from fingers to shoulder working from proximal to distal then retracing all steps.    Passive ROM Lt shoulder into flexion, abduction, and er to pts tolerance.                        [place] - 12/13/16 1234      CC Long Term Goal  #1   Title Left arm circumference at hand will reduce by 0.7 cm. to 21 cm.   Baseline 21.7 at eval   Time 4   Period Weeks   Status New     CC Long Term Goal  #2   Title Pt. will be independent in self-care for lymphedema.   Time 4   Period Weeks   Status New     CC Long Term Goal  #3   Title Patient will have been fitted for a compression sleeve and glove for left UE   Time 4   Period Weeks     CC Long Term Goal  #4   Title Pt. will report at least 50% decrease in left UE pain at the end of the day compared to initial eval today   Baseline up to 8/10 at eval   Time 4   Period Weeks   Status New     CC Long Term Goal  #5   Title lymphedema life impact scale score reduced to 35% impairment or less   Baseline score of 45 with 16 questions answered = 70% impairment at eval   Time 4   Period Weeks   Status New            Plan - 12/24/16 1103    Clinical Impression Statement Pt did very well with AA/ROM today with notable improvements  with  her end ROM since this therapist saw her for her last encounter with [place] (about 6 months ago) and with less pain, though she is still limited with her end range. Her P/ROM also, though limited, shows improvement. Continued with manual lymph drainage  to further her reduction of fluid in hopes to get her measured for compression garments next week if order is returned signed.    Rehab Potential Good   PT Frequency 2x / week   PT Duration 4 weeks   PT Treatment/Interventions ADLs/Self Care Home Management;Therapeutic exercise;Patient/family education;Orthotic Fit/Training;Manual techniques;Manual lymph drainage;Compression bandaging;Passive range of motion;Taping   PT Next Visit Plan Cont manual lymph drainage to left UE; Have patient fitted for compression garments once order returns signed.  Include left shoulder ROM and strengthening as time permits.   Consulted and Agree with Plan of Care Patient      Patient will benefit from skilled therapeutic intervention in order to improve the following deficits and impairments:  Increased edema, Pain, Decreased strength, Decreased range of motion  Visit Diagnosis: Lymphedema, not elsewhere classified  Pain in left arm  Stiffness of left shoulder, not elsewhere classified  Left shoulder pain, unspecified chronicity     Problem List Patient Active Problem List   Diagnosis Date Noted  . Lymphedema of right upper extremity 12/07/2016  . BMI 40.0-44.9, adult ([place]) 06/22/2016  . S/P shoulder replacement 03/04/2016  . Constipation 12/16/2014  . Chemotherapy-induced nausea 12/16/2014  . Hyperglycemia 12/09/2014  . 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 Leukocytosis   . DVT (deep venous thrombosis) ([place]) 12/02/2014  . Osteoarthritis of both feet 11/26/2014  . Poor venous access   . Colorectal cancer ([place]) 10/03/2014  . Malignant neoplasm of lower-inner quadrant of right breast of female, estrogen receptor negative ([place]) 09/27/2014  .  Anemia, iron deficiency 04/25/2013  . GERD (gastroesophageal reflux disease) 02/12/2013  . HTN (hypertension) 10/21/2011  . Hypothyroidism 10/21/2011  . Depression 10/21/2011  . Hyperlipidemia 10/21/2011  . Lung nodules 10/21/2011  . Vertigo 10/21/2011  . Vitamin D deficiency 10/21/2011    Otelia Limes, PTA 12/24/2016, 11:06 AM  [place], [place], [address] Phone: [phone]   Fax:  [phone]  Name: Jordan [name] MRN: [id number] Date of Birth: 10-16-1950

## 2016-12-29 ENCOUNTER — Ambulatory Visit: Payer: Medicare Other

## 2016-12-29 DIAGNOSIS — M79602 Pain in left arm: Secondary | ICD-10-CM | POA: Diagnosis not present

## 2016-12-29 DIAGNOSIS — M25512 Pain in left shoulder: Secondary | ICD-10-CM | POA: Diagnosis not present

## 2016-12-29 DIAGNOSIS — M25612 Stiffness of left shoulder, not elsewhere classified: Secondary | ICD-10-CM | POA: Diagnosis not present

## 2016-12-29 DIAGNOSIS — I890 Lymphedema, not elsewhere classified: Secondary | ICD-10-CM

## 2016-12-29 NOTE — Therapy (Signed)
[place], [place], [address] Phone: [phone]   Fax:  [phone]  Physical Therapy Treatment  Patient Details  Name: Jordan [name] MRN: [id number] Date of Birth: 05-02-1951 Referring Provider: Harrison Mons, PA-C  Encounter Date: 12/29/2016      PT End of Session - 12/29/16 1157    Visit Number 4   Number of Visits 9   Date for PT Re-Evaluation 01/21/17   PT Start Time 6759   PT Stop Time 1130  pt started to not feel well so ended early   PT Time Calculation (min) 28 min   Activity Tolerance Patient tolerated treatment well;Treatment limited secondary to medical complications (Comment);Other (comment)  Pt was tolerating session fine but started to not feel well so had to end early.   Behavior During Therapy [facility] for tasks assessed/performed      Past Medical History:  Diagnosis Date  . Anemia   . Arthritis    oa left shoulder  . Back pain    buldging disc  . Blood clot of neck vein   . Breast cancer ([place]) 10/17/2014   lower inner quadrant of the right breast  . Cancer ([place])    colon  . Constipation   . Depression    takes Wellbutrin daily  . Dizziness    r/t side effects from meds  . Family history of adverse reaction to anesthesia    It is hard for my mother to wake up from anesthesia.  . Generalized headaches    due to allergies, sinus  . GERD (gastroesophageal reflux disease)    takes Protonix as needed  . Hemorrhoids   . History of bronchitis    last time about 6-70yrs ago  . History of colon polyps   . History of hiatal hernia   . History of migraine    last one about 15+yrs ago  . History of UTI   . Hx of seasonal allergies    takes OTC allergy med nightly  . Hyperlipidemia    takes Zetia and Zocor daily  . Hypertension    takes Maxzide and Metoprolol daily  . Hypothyroidism    takes Synthroid daily  . Joint pain   . Leg swelling   . Mass of colon   . Nasal  congestion   . Nausea   . Pneumonia    walking about 6-66yrs ago  . PONV (postoperative nausea and vomiting)    pt states she is very easy to sedate  . S/P radiation therapy 03/11/2015 through 04/25/2015    Right breast 4500 cGy in 25 sessions, right breast boost 1600 cGy in 8 sessions   . Thyroid disease   . Tuberculosis    as little girl    . Vertigo    takes Meclizine prn  . Wears glasses   . Wears glasses     Past Surgical History:  Procedure Laterality Date  . ABDOMINAL HYSTERECTOMY    . APPENDECTOMY    . CARPAL TUNNEL RELEASE    . [place]  . COLONOSCOPY    . ESOPHAGOGASTRODUODENOSCOPY    . EXPLORATORY LAPAROTOMY    . KNEE SURGERY  1998   right - arthroscopic  . PARTIAL COLECTOMY  03/30/2012   Procedure: PARTIAL COLECTOMY;  Surgeon: [name], MD;  Location: [place];  Service: General;  Laterality: N/A;  . PORT-A-CATH REMOVAL N/A 06/25/2015   Procedure: REMOVAL PORT-A-CATH;  Surgeon: [name],  MD;  Location: [place];  Service: General;  Laterality: N/A;  . PORTACATH PLACEMENT    . RADIOACTIVE SEED GUIDED MASTECTOMY WITH AXILLARY SENTINEL LYMPH NODE BIOPSY Right 10/17/2014   Procedure: RADIOACTIVE SEED GUIDED PARTIAL MASTECTOMY WITH AXILLARY SENTINEL LYMPH NODE BIOPSY;  Surgeon: [name], MD;  Location: [place];  Service: General;  Laterality: Right;  . TOTAL SHOULDER ARTHROPLASTY Left 03/04/2016   Procedure: LEFT TOTAL SHOULDER ARTHROPLASTY;  Surgeon: [name], MD;  Location: [place];  Service: Orthopedics;  Laterality: Left;    There were no vitals filed for this visit.      Subjective Assessment - 12/29/16 1108    Subjective My Lt arm is doing okay, but I also am trying not to push using my arm too much either.    Pertinent History Left breast cancer diagnosed January 2016 with lumpectomy, then chemo, then radiation (finished all 04/23/15 approx.).   Left total shoulder replacement 03/04/16 approx.  HTN controlled with meds.  Cleaning and helping her mother transfer caused left arm aggravation, with whole arm swelling.   Patient Stated Goals get rid of the pain and swelling   Currently in Pain? No/denies               LYMPHEDEMA/ONCOLOGY QUESTIONNAIRE - 12/29/16 1111      Left Upper Extremity Lymphedema   10 cm Proximal to Olecranon Process --  Pt started not feeingl well, ended early, unable to finish    Olecranon Process 29.6 cm   15 cm Proximal to Ulnar Styloid Process 30.7 cm   10 cm Proximal to Ulnar Styloid Process 28.2 cm   Just Proximal to Ulnar Styloid Process 19 cm   Across Hand at [employer] 20.7 cm   At [place] of 2nd Digit 7 cm                  [facility] Adult PT Treatment/Exercise - 12/29/16 0001      Manual Therapy   Manual Therapy Edema management;Manual Lymphatic Drainage (MLD);Passive ROM   Manual Lymphatic Drainage (MLD) Five diaphragmatic breaths, then short neck, right axilla nodes and anterior interaxillary anastomosis, left inguinal nodes and axillo-inguinal anastomosis, and left upper arm was as far as therapist was able to go as pt reported started feeling nauseous and needed tosit up. After sitting for a few minutes she felt she needed to just stop session for today due to not feeling well.    Passive ROM --                        Long Term Clinic Goals - 12/13/16 1234      CC Long Term Goal  #1   Title Left arm circumference at hand will reduce by 0.7 cm. to 21 cm.   Baseline 21.7 at eval   Time 4   Period Weeks   Status New     CC Long Term Goal  #2   Title Pt. will be independent in self-care for lymphedema.   Time 4   Period Weeks   Status New     CC Long Term Goal  #3   Title Patient will have been fitted for a compression sleeve and glove for left UE   Time 4   Period Weeks     CC Long Term Goal  #4   Title Pt. will report at least 50% decrease in left  UE pain at the end of the day  compared to initial eval today   Baseline up to 8/10 at eval   Time 4   Period Weeks   Status New     CC Long Term Goal  #5   Title lymphedema life impact scale score reduced to 35% impairment or less   Baseline score of 45 with 16 questions answered = 70% impairment at eval   Time 4   Period Weeks   Status New            Plan - 12/29/16 1159    Clinical Impression Statement Pt reported that she feels her Lt UE swelling is doing okay and her circumference mesurements today supported this having maintained or reduced at all areas measured except slight increase at 10 cm to ulnar styloid process. Pt was seeming fine but started reporting feeling nauseous, possibly like reflux and needed to sit up, after sitting a few minutes she felt she needed to just stop session for today.    Rehab Potential Good   PT Frequency 2x / week   PT Duration 4 weeks   PT Treatment/Interventions ADLs/Self Care Home Management;Therapeutic exercise;Patient/family education;Orthotic Fit/Training;Manual techniques;Manual lymph drainage;Compression bandaging;Passive range of motion;Taping   PT Next Visit Plan Cont manual lymph drainage to left UE; Have patient fitted for compression garments once order returns signed.  Include left shoulder ROM and strengthening as time permits.   Consulted and Agree with Plan of Care Patient      Patient will benefit from skilled therapeutic intervention in order to improve the following deficits and impairments:  Increased edema, Pain, Decreased strength, Decreased range of motion  Visit Diagnosis: Lymphedema, not elsewhere classified  Pain in left arm     Problem List Patient Active Problem List   Diagnosis Date Noted  . Lymphedema of right upper extremity 12/07/2016  . BMI 40.0-44.9, adult ([place]) 06/22/2016  . S/P shoulder replacement 03/04/2016  . Constipation 12/16/2014  . Chemotherapy-induced nausea 12/16/2014  . Hyperglycemia  12/09/2014  . Internal jugular vein thrombosis ([place])   . Leukocytosis   . DVT (deep venous thrombosis) ([place]) 12/02/2014  . Osteoarthritis of both feet 11/26/2014  . Poor venous access   . Colorectal cancer ([place]) 10/03/2014  . Malignant neoplasm of lower-inner quadrant of right breast of female, estrogen receptor negative ([place]) 09/27/2014  . Anemia, iron deficiency 04/25/2013  . GERD (gastroesophageal reflux disease) 02/12/2013  . HTN (hypertension) 10/21/2011  . Hypothyroidism 10/21/2011  . Depression 10/21/2011  . Hyperlipidemia 10/21/2011  . Lung nodules 10/21/2011  . Vertigo 10/21/2011  . Vitamin D deficiency 10/21/2011    Otelia Limes, PTA 12/29/2016, 12:02 PM  [place], [place], [address] Phone: [phone]   Fax:  [phone]  Name: EMORY LEAVER MRN: [id number] Date of Birth: 04-Apr-1951

## 2017-01-03 ENCOUNTER — Ambulatory Visit: Payer: Medicare Other | Admitting: Physical Therapy

## 2017-01-03 DIAGNOSIS — M79602 Pain in left arm: Secondary | ICD-10-CM | POA: Diagnosis not present

## 2017-01-03 DIAGNOSIS — M25612 Stiffness of left shoulder, not elsewhere classified: Secondary | ICD-10-CM

## 2017-01-03 DIAGNOSIS — I890 Lymphedema, not elsewhere classified: Secondary | ICD-10-CM | POA: Diagnosis not present

## 2017-01-03 DIAGNOSIS — M25512 Pain in left shoulder: Secondary | ICD-10-CM | POA: Diagnosis not present

## 2017-01-03 NOTE — Therapy (Signed)
[place], [place], [address] Phone: [phone]   Fax:  [phone]  Physical Therapy Treatment  Patient Details  Name: Jordan [name] MRN: [id number] Date of Birth: 05/22/1951 Referring Provider: Harrison Mons, PA-C  Encounter Date: 01/03/2017      PT End of Session - 01/03/17 7096    Visit Number 5   Number of Visits 9   Date for PT Re-Evaluation 01/21/17   PT Start Time 0933   PT Stop Time 1017   PT Time Calculation (min) 44 min   Activity Tolerance Patient tolerated treatment well   Behavior During Therapy [facility] for tasks assessed/performed      Past Medical History:  Diagnosis Date  . Anemia   . Arthritis    oa left shoulder  . Back pain    buldging disc  . Blood clot of neck vein   . Breast cancer ([place]) 10/17/2014   lower inner quadrant of the right breast  . Cancer ([place])    colon  . Constipation   . Depression    takes Wellbutrin daily  . Dizziness    r/t side effects from meds  . Family history of adverse reaction to anesthesia    It is hard for my mother to wake up from anesthesia.  . Generalized headaches    due to allergies, sinus  . GERD (gastroesophageal reflux disease)    takes Protonix as needed  . Hemorrhoids   . History of bronchitis    last time about 6-41yrs ago  . History of colon polyps   . History of hiatal hernia   . History of migraine    last one about 15+yrs ago  . History of UTI   . Hx of seasonal allergies    takes OTC allergy med nightly  . Hyperlipidemia    takes Zetia and Zocor daily  . Hypertension    takes Maxzide and Metoprolol daily  . Hypothyroidism    takes Synthroid daily  . Joint pain   . Leg swelling   . Mass of colon   . Nasal congestion   . Nausea   . Pneumonia    walking about 6-66yrs ago  . PONV (postoperative nausea and vomiting)    pt states she is very easy to sedate  . S/P radiation therapy 03/11/2015 through  04/25/2015    Right breast 4500 cGy in 25 sessions, right breast boost 1600 cGy in 8 sessions   . Thyroid disease   . Tuberculosis    as little girl    . Vertigo    takes Meclizine prn  . Wears glasses   . Wears glasses     Past Surgical History:  Procedure Laterality Date  . ABDOMINAL HYSTERECTOMY    . APPENDECTOMY    . CARPAL TUNNEL RELEASE    . [place]  . COLONOSCOPY    . ESOPHAGOGASTRODUODENOSCOPY    . EXPLORATORY LAPAROTOMY    . KNEE SURGERY  1998   right - arthroscopic  . PARTIAL COLECTOMY  03/30/2012   Procedure: PARTIAL COLECTOMY;  Surgeon: [name], MD;  Location: [place];  Service: General;  Laterality: N/A;  . PORT-A-CATH REMOVAL N/A 06/25/2015   Procedure: REMOVAL PORT-A-CATH;  Surgeon: [name], MD;  Location: [place];  Service: General;  Laterality: N/A;  . PORTACATH PLACEMENT    . RADIOACTIVE SEED GUIDED MASTECTOMY WITH AXILLARY SENTINEL LYMPH NODE BIOPSY Right 10/17/2014   Procedure:  RADIOACTIVE SEED GUIDED PARTIAL MASTECTOMY WITH AXILLARY SENTINEL LYMPH NODE BIOPSY;  Surgeon: [name], MD;  Location: [place];  Service: General;  Laterality: Right;  . TOTAL SHOULDER ARTHROPLASTY Left 03/04/2016   Procedure: LEFT TOTAL SHOULDER ARTHROPLASTY;  Surgeon: [name], MD;  Location: [place];  Service: Orthopedics;  Laterality: Left;    There were no vitals filed for this visit.      Subjective Assessment - 01/03/17 0937    Subjective Not feeling better today because of the pollen and sinus; also ate something that didn't agree with her yesterday.                           [place] Adult PT Treatment/Exercise - 01/03/17 0001      Self-Care   Self-Care Other Self-Care Comments   Other Self-Care Comments  Gave patient sleeve and glove prescription that was just returned signed, and talked about obtaining garments.  Will check on [employer]  coverage.  Pt. also wants referral to counseling; discussed Social Work at [employer] as starting point.     Manual Therapy   Manual Lymphatic Drainage (MLD) Five diaphragmatic breaths, then short neck, right axilla nodes and anterior interaxillary anastomosis, left inguinal nodes and axillo-inguinal anastomosis, and left UE from fingers to shoulder working from proximal to distal then retracing all steps.    Passive ROM Lt shoulder into flexion, abduction, and er to pts tolerance.                        Long Term Clinic Goals - 01/03/17 0946      CC Long Term Goal  #1   Title Left arm circumference at hand will reduce by 0.7 cm. to 21 cm.   Status Achieved  achieved as of 12/29/16     CC Long Term Goal  #2   Title Pt. will be independent in self-care for lymphedema.   Status On-going     CC Long Term Goal  #3   Title Patient will have been fitted for a compression sleeve and glove for left UE   Status On-going     CC Long Term Goal  #4   Title Pt. will report at least 50% decrease in left UE pain at the end of the day compared to initial eval today   Baseline up to 8/10 at eval; as of 01/03/2017, still having ups and downs depending on what she does with the arm--so worse today because of cooking yesterday   Status On-going            Plan - 01/03/17 1307    Clinical Impression Statement Hand was puffy looking today, and patient reports her left UE symptoms (swelling and discomfort) increase with increased activity such as the cooking she did yesterday. Although she reported not feeling much better today, she did tolerate full treatment. She did ask about how to get some counseling.   Rehab Potential Good   PT Frequency 2x / week   PT Duration 4 weeks   PT Treatment/Interventions ADLs/Self Care Home Management;Therapeutic exercise;Patient/family education;Orthotic Fit/Training;Manual techniques;Manual lymph drainage;Compression bandaging;Passive range of  motion;Taping   PT Next Visit Plan Give patient information about having sleeve covered by [employer]. Cont manual lymph drainage to left UE (only using Lt axillo-inguinal anastomosis due to Rt SLNB);  Include left shoulder ROM and strengthening as time permits.   Recommended Other Services received compression sleeve  and glove signed prescription; will contact Social Work Dept. at [facility] about counseling, per patient's request   Consulted and Agree with Plan of Care Patient      Patient will benefit from skilled therapeutic intervention in order to improve the following deficits and impairments:  Increased edema, Pain, Decreased strength, Decreased range of motion  Visit Diagnosis: Lymphedema, not elsewhere classified  Pain in left arm  Stiffness of left shoulder, not elsewhere classified     Problem List Patient Active Problem List   Diagnosis Date Noted  . Lymphedema of right upper extremity 12/07/2016  . BMI 40.0-44.9, adult ([place]) 06/22/2016  . S/P shoulder replacement 03/04/2016  . Constipation 12/16/2014  . Chemotherapy-induced nausea 12/16/2014  . Hyperglycemia 12/09/2014  . Internal jugular vein thrombosis ([place])   . Leukocytosis   . DVT (deep venous thrombosis) ([place]) 12/02/2014  . Osteoarthritis of both feet 11/26/2014  . Poor venous access   . Colorectal cancer ([place]) 10/03/2014  . Malignant neoplasm of lower-inner quadrant of right breast of female, estrogen receptor negative ([place]) 09/27/2014  . Anemia, iron deficiency 04/25/2013  . GERD (gastroesophageal reflux disease) 02/12/2013  . HTN (hypertension) 10/21/2011  . Hypothyroidism 10/21/2011  . Depression 10/21/2011  . Hyperlipidemia 10/21/2011  . Lung nodules 10/21/2011  . Vertigo 10/21/2011  . Vitamin D deficiency 10/21/2011    Izyan Ezzell 01/03/2017, 1:12 PM  [place], [place], [address] Phone: [phone]   Fax:   [phone]  Name: Jordan [name] MRN: [id number] Date of Birth: 12-Jan-1951  Serafina Royals, PT 01/03/17 1:12 PM

## 2017-01-04 ENCOUNTER — Encounter: Payer: Self-pay | Admitting: *Deleted

## 2017-01-04 NOTE — Progress Notes (Signed)
[facility] Clinical Social Worker  Clinical Social worker received referral from physical therapist for emotional support.  CSW contacted patient at home to assess for needs and offer support.  Patient expressed increased emotional stress.  Patient is currently out of treatment, but has experienced additional health issues and the loss of her father.  CSW discussed support programs and [facility].  Patient expressed interest in the [place] will put patients information on the list for the next [facility] program.  CSW and patient also discussed counseling options.  Patient was agreeable to a referral to the [facility] counseling intern.  CSw encouraged patient to call with additional needs or concerns.   Johnnye Lana, MSW, LCSW, OSW-C Clinical Social Worker [facility] [phone]

## 2017-01-06 ENCOUNTER — Other Ambulatory Visit: Payer: Self-pay | Admitting: Physician Assistant

## 2017-01-06 DIAGNOSIS — E039 Hypothyroidism, unspecified: Secondary | ICD-10-CM

## 2017-01-06 MED ORDER — LEVOTHYROXINE SODIUM 112 MCG PO TABS: 112.0000 ug | ORAL_TABLET | Freq: Every day | ORAL | 3 refills | Status: DC

## 2017-01-09 ENCOUNTER — Telehealth: Payer: Self-pay | Admitting: Physician Assistant

## 2017-01-09 NOTE — Telephone Encounter (Signed)
Called patient to express my sympathy. The patient found her mother deceased this morning.

## 2017-01-11 NOTE — Progress Notes (Signed)
[facility] Counseling Note  Counselor called patient per referral from [place]/LCSW to see if pt is interested in setting up an intake appointment. Pt shared that her mother recently passed away and that she is too overwhelmed in the details and grief to schedule an appointment at this time. Counselor expressed sadness for pt's loss, provided reflective listening and validation, and asked permission to call back next week. Pt seemed relieved and grateful. Counselor will call back 6/4.  [place] Pam, [place] [facility] [place]

## 2017-01-12 ENCOUNTER — Encounter: Payer: Medicare Other | Admitting: Physical Therapy

## 2017-01-18 ENCOUNTER — Ambulatory Visit: Payer: Medicare Other

## 2017-01-25 ENCOUNTER — Ambulatory Visit: Payer: Medicare Other | Attending: Physician Assistant

## 2017-01-25 DIAGNOSIS — I890 Lymphedema, not elsewhere classified: Secondary | ICD-10-CM | POA: Insufficient documentation

## 2017-01-25 DIAGNOSIS — M79602 Pain in left arm: Secondary | ICD-10-CM | POA: Diagnosis not present

## 2017-01-25 DIAGNOSIS — R600 Localized edema: Secondary | ICD-10-CM | POA: Diagnosis not present

## 2017-01-25 DIAGNOSIS — M25512 Pain in left shoulder: Secondary | ICD-10-CM | POA: Diagnosis not present

## 2017-01-25 DIAGNOSIS — M25612 Stiffness of left shoulder, not elsewhere classified: Secondary | ICD-10-CM | POA: Diagnosis not present

## 2017-01-25 NOTE — Therapy (Signed)
[place], [place], [address] Phone: [phone]   Fax:  [phone]  Physical Therapy Treatment  Patient Details  Name: Jordan [name] MRN: [id number] Date of Birth: October 25, 1950 Referring Provider: Harrison Mons, PA-C  Encounter Date: 01/25/2017      PT End of Session - 01/25/17 1202    Visit Number 6   Number of Visits 9   Date for PT Re-Evaluation 01/21/17   PT Start Time 1108   PT Stop Time 1151   PT Time Calculation (min) 43 min   Activity Tolerance Patient tolerated treatment well   Behavior During Therapy [facility] for tasks assessed/performed      Past Medical History:  Diagnosis Date  . Anemia   . Arthritis    oa left shoulder  . Back pain    buldging disc  . Blood clot of neck vein   . Breast cancer ([place]) 10/17/2014   lower inner quadrant of the right breast  . Cancer ([place])    colon  . Constipation   . Depression    takes Wellbutrin daily  . Dizziness    r/t side effects from meds  . Family history of adverse reaction to anesthesia    It is hard for my mother to wake up from anesthesia.  . Generalized headaches    due to allergies, sinus  . GERD (gastroesophageal reflux disease)    takes Protonix as needed  . Hemorrhoids   . History of bronchitis    last time about 6-86yrs ago  . History of colon polyps   . History of hiatal hernia   . History of migraine    last one about 15+yrs ago  . History of UTI   . Hx of seasonal allergies    takes OTC allergy med nightly  . Hyperlipidemia    takes Zetia and Zocor daily  . Hypertension    takes Maxzide and Metoprolol daily  . Hypothyroidism    takes Synthroid daily  . Joint pain   . Leg swelling   . Mass of colon   . Nasal congestion   . Nausea   . Pneumonia    walking about 6-65yrs ago  . PONV (postoperative nausea and vomiting)    pt states she is very easy to sedate  . S/P radiation therapy 03/11/2015 through  04/25/2015    Right breast 4500 cGy in 25 sessions, right breast boost 1600 cGy in 8 sessions   . Thyroid disease   . Tuberculosis    as little girl    . Vertigo    takes Meclizine prn  . Wears glasses   . Wears glasses     Past Surgical History:  Procedure Laterality Date  . ABDOMINAL HYSTERECTOMY    . APPENDECTOMY    . CARPAL TUNNEL RELEASE    . [place]  . COLONOSCOPY    . ESOPHAGOGASTRODUODENOSCOPY    . EXPLORATORY LAPAROTOMY    . KNEE SURGERY  1998   right - arthroscopic  . PARTIAL COLECTOMY  03/30/2012   Procedure: PARTIAL COLECTOMY;  Surgeon: [name], MD;  Location: [place];  Service: General;  Laterality: N/A;  . PORT-A-CATH REMOVAL N/A 06/25/2015   Procedure: REMOVAL PORT-A-CATH;  Surgeon: [name], MD;  Location: [place];  Service: General;  Laterality: N/A;  . PORTACATH PLACEMENT    . RADIOACTIVE SEED GUIDED MASTECTOMY WITH AXILLARY SENTINEL LYMPH NODE BIOPSY Right 10/17/2014   Procedure:  RADIOACTIVE SEED GUIDED PARTIAL MASTECTOMY WITH AXILLARY SENTINEL LYMPH NODE BIOPSY;  Surgeon: [name], MD;  Location: [place];  Service: General;  Laterality: Right;  . TOTAL SHOULDER ARTHROPLASTY Left 03/04/2016   Procedure: LEFT TOTAL SHOULDER ARTHROPLASTY;  Surgeon: [name], MD;  Location: [place];  Service: Orthopedics;  Laterality: Left;    There were no vitals filed for this visit.      Subjective Assessment - 01/25/17 1114    Subjective I'm sorry I haven't been here in a few weeks, my mom passed away a few days after my last visit. My arm is just doing okay, the swelling hasn't been too bad recently but I haven't been doing as much either.    Pertinent History Right breast cancer diagnosed January 2016 with lumpectomy, then chemo, then radiation (finished all 04/23/15 approx.).  Left total shoulder replacement 03/04/16 approx.  HTN controlled with meds.   Cleaning and helping her mother transfer caused left arm aggravation, with whole arm swelling.   Patient Stated Goals get rid of the pain and swelling   Currently in Pain? Yes   Pain Score 3    Pain Location Shoulder   Pain Orientation Left   Pain Descriptors / Indicators Dull   Pain Type Chronic pain   Pain Onset More than a month ago   Pain Frequency Intermittent   Aggravating Factors  overuse of my arm   Pain Relieving Factors resting it            [facility] PT Assessment - 01/25/17 0001      AROM   Left Shoulder Flexion 151 Degrees   Left Shoulder ABduction 108 Degrees  Still painful, but less so           LYMPHEDEMA/ONCOLOGY QUESTIONNAIRE - 01/25/17 1121      Left Upper Extremity Lymphedema   15 cm Proximal to Olecranon Process 39.3 cm   10 cm Proximal to Olecranon Process 36.9 cm   Olecranon Process 30.1 cm   15 cm Proximal to Ulnar Styloid Process 30.6 cm   10 cm Proximal to Ulnar Styloid Process 27.5 cm   Just Proximal to Ulnar Styloid Process 19.6 cm   Across Hand at [employer] 20.2 cm   At [place] of 2nd Digit 7.5 cm                  [facility] Adult PT Treatment/Exercise - 01/25/17 0001      Shoulder Exercises: Pulleys   Flexion 2 minutes   ABduction 2 minutes     Manual Therapy   Manual Therapy Manual Lymphatic Drainage (MLD);Passive ROM   Manual Lymphatic Drainage (MLD) Short neck, five diaphragmatic breaths, left inguinal nodes and axillo-inguinal anastomosis, and left UE from fingers to shoulder working from proximal to distal then retracing all steps.    Passive ROM Lt shoulder into flexion, abduction, and er to pts tolerance.                        Long Term Clinic Goals - 01/25/17 1215      CC Long Term Goal  #1   Title Left arm circumference at hand will reduce by 0.7 cm. to 21 cm.   Baseline 21.7 at eval; 20.2 cm-01/25/17   Status Achieved     CC Long Term Goal  #2   Title Pt. will be independent in self-care for  lymphedema.   Status On-going     CC  Long Term Goal  #3   Title Patient will have been fitted for a compression sleeve and glove for left UE   Baseline Pt gets measured tomorrow-01/25/17   Status On-going     CC Long Term Goal  #4   Title Pt. will report at least 50% decrease in left UE pain at the end of the day compared to initial eval today   Baseline up to 8/10 at eval; as of 01/03/2017, still having ups and downs depending on what she does with the arm--so worse today because of cooking yesterday; improving overall as pts A/ROM has improved but did not rate today-01/25/17   Status On-going     CC Long Term Goal  #5   Title lymphedema life impact scale score reduced to 35% impairment or less   Baseline score of 45 with 16 questions answered = 70% impairment at eval   Status On-going            Plan - 01/25/17 1203    Clinical Impression Statement Pt hasn't been here for past 3 weeks due to the passing of her mother. Her A/ROM and circumference measurements are improved since pt was last here. She reports hasn't been using her arm as much over past week as her randdaughter has been doing more around the house. She gets measured for a compression sleeve tomorrow.    Rehab Potential Good   PT Frequency 2x / week   PT Duration 4 weeks   PT Treatment/Interventions ADLs/Self Care Home Management;Therapeutic exercise;Patient/family education;Orthotic Fit/Training;Manual techniques;Manual lymph drainage;Compression bandaging;Passive range of motion;Taping   PT Next Visit Plan Have pt retake LLIS for goal assess. Cont manual lymph drainage to left UE (only using Lt axillo-inguinal anastomosis due to Rt SLNB);  Include left shoulder ROM and strengthening as time permits.   Consulted and Agree with Plan of Care Patient      Patient will benefit from skilled therapeutic intervention in order to improve the following deficits and impairments:  Increased edema, Pain, Decreased strength,  Decreased range of motion  Visit Diagnosis: Lymphedema, not elsewhere classified  Pain in left arm  Stiffness of left shoulder, not elsewhere classified  Left shoulder pain, unspecified chronicity  Localized edema     Problem List Patient Active Problem List   Diagnosis Date Noted  . Lymphedema of right upper extremity 12/07/2016  . BMI 40.0-44.9, adult ([place]) 06/22/2016  . S/P shoulder replacement 03/04/2016  . Constipation 12/16/2014  . Chemotherapy-induced nausea 12/16/2014  . Hyperglycemia 12/09/2014  . Internal jugular vein thrombosis ([place])   . Leukocytosis   . DVT (deep venous thrombosis) ([place]) 12/02/2014  . Osteoarthritis of both feet 11/26/2014  . Poor venous access   . Colorectal cancer ([place]) 10/03/2014  . Malignant neoplasm of lower-inner quadrant of right breast of female, estrogen receptor negative ([place]) 09/27/2014  . Anemia, iron deficiency 04/25/2013  . GERD (gastroesophageal reflux disease) 02/12/2013  . HTN (hypertension) 10/21/2011  . Hypothyroidism 10/21/2011  . Depression 10/21/2011  . Hyperlipidemia 10/21/2011  . Lung nodules 10/21/2011  . Vertigo 10/21/2011  . Vitamin D deficiency 10/21/2011    Otelia Limes, PTA 01/25/2017, 12:18 PM  [place], [place], [address] Phone: [phone]   Fax:  [phone]  Name: Jordan [name] MRN: [id number] Date of Birth: 1950/09/17

## 2017-01-25 NOTE — Addendum Note (Signed)
Addended by: Wynelle Beckmann, Hilaria Ota L on: 01/25/2017 01:00 PM   Modules accepted: Orders

## 2017-01-26 ENCOUNTER — Ambulatory Visit: Payer: Medicare Other

## 2017-01-26 DIAGNOSIS — M25612 Stiffness of left shoulder, not elsewhere classified: Secondary | ICD-10-CM | POA: Diagnosis not present

## 2017-01-26 DIAGNOSIS — I890 Lymphedema, not elsewhere classified: Secondary | ICD-10-CM

## 2017-01-26 DIAGNOSIS — M25512 Pain in left shoulder: Secondary | ICD-10-CM

## 2017-01-26 DIAGNOSIS — M79602 Pain in left arm: Secondary | ICD-10-CM

## 2017-01-26 DIAGNOSIS — R600 Localized edema: Secondary | ICD-10-CM

## 2017-01-26 NOTE — Therapy (Signed)
[place], [place], [address] Phone: [phone]   Fax:  [phone]  Physical Therapy Treatment  Patient Details  Name: Jordan [name] MRN: [id number] Date of Birth: 19-Nov-1950 Referring Provider: Harrison Mons, PA-C  Encounter Date: 01/26/2017      PT End of Session - 01/26/17 1213    Visit Number 7   Number of Visits 17   Date for PT Re-Evaluation 02/22/17   PT Start Time 1114   PT Stop Time 1200   PT Time Calculation (min) 46 min   Activity Tolerance Patient tolerated treatment well   Behavior During Therapy [facility] for tasks assessed/performed      Past Medical History:  Diagnosis Date  . Anemia   . Arthritis    oa left shoulder  . Back pain    buldging disc  . Blood clot of neck vein   . Breast cancer ([place]) 10/17/2014   lower inner quadrant of the right breast  . Cancer ([place])    colon  . Constipation   . Depression    takes Wellbutrin daily  . Dizziness    r/t side effects from meds  . Family history of adverse reaction to anesthesia    It is hard for my mother to wake up from anesthesia.  . Generalized headaches    due to allergies, sinus  . GERD (gastroesophageal reflux disease)    takes Protonix as needed  . Hemorrhoids   . History of bronchitis    last time about 6-4yrs ago  . History of colon polyps   . History of hiatal hernia   . History of migraine    last one about 15+yrs ago  . History of UTI   . Hx of seasonal allergies    takes OTC allergy med nightly  . Hyperlipidemia    takes Zetia and Zocor daily  . Hypertension    takes Maxzide and Metoprolol daily  . Hypothyroidism    takes Synthroid daily  . Joint pain   . Leg swelling   . Mass of colon   . Nasal congestion   . Nausea   . Pneumonia    walking about 6-70yrs ago  . PONV (postoperative nausea and vomiting)    pt states she is very easy to sedate  . S/P radiation therapy 03/11/2015 through  04/25/2015    Right breast 4500 cGy in 25 sessions, right breast boost 1600 cGy in 8 sessions   . Thyroid disease   . Tuberculosis    as little girl    . Vertigo    takes Meclizine prn  . Wears glasses   . Wears glasses     Past Surgical History:  Procedure Laterality Date  . ABDOMINAL HYSTERECTOMY    . APPENDECTOMY    . CARPAL TUNNEL RELEASE    . [place]  . COLONOSCOPY    . ESOPHAGOGASTRODUODENOSCOPY    . EXPLORATORY LAPAROTOMY    . KNEE SURGERY  1998   right - arthroscopic  . PARTIAL COLECTOMY  03/30/2012   Procedure: PARTIAL COLECTOMY;  Surgeon: [name], MD;  Location: [place];  Service: General;  Laterality: N/A;  . PORT-A-CATH REMOVAL N/A 06/25/2015   Procedure: REMOVAL PORT-A-CATH;  Surgeon: [name], MD;  Location: [place];  Service: General;  Laterality: N/A;  . PORTACATH PLACEMENT    . RADIOACTIVE SEED GUIDED MASTECTOMY WITH AXILLARY SENTINEL LYMPH NODE BIOPSY Right 10/17/2014   Procedure:  RADIOACTIVE SEED GUIDED PARTIAL MASTECTOMY WITH AXILLARY SENTINEL LYMPH NODE BIOPSY;  Surgeon: [name], MD;  Location: [place];  Service: General;  Laterality: Right;  . TOTAL SHOULDER ARTHROPLASTY Left 03/04/2016   Procedure: LEFT TOTAL SHOULDER ARTHROPLASTY;  Surgeon: [name], MD;  Location: [place];  Service: Orthopedics;  Laterality: Left;    There were no vitals filed for this visit.      Subjective Assessment - 01/26/17 1209    Subjective Nothing new since I was here yesterday. I'm glad I'm getting measured for agarment today!   Pertinent History Right breast cancer diagnosed January 2016 with lumpectomy, then chemo, then radiation (finished all 04/23/15 approx.).  Left total shoulder replacement 03/04/16 approx.  HTN controlled with meds.  Cleaning and helping her mother transfer caused left arm aggravation, with whole arm swelling.   Patient Stated Goals get  rid of the pain and swelling   Currently in Pain? Yes   Pain Score 3    Pain Location Shoulder   Pain Orientation Left   Pain Descriptors / Indicators Dull   Pain Type Chronic pain   Pain Onset More than a month ago   Pain Frequency Intermittent   Aggravating Factors  overuse of arm   Pain Relieving Factors resting it               LYMPHEDEMA/ONCOLOGY QUESTIONNAIRE - 01/25/17 1121      Left Upper Extremity Lymphedema   15 cm Proximal to Olecranon Process 39.3 cm   10 cm Proximal to Olecranon Process 36.9 cm   Olecranon Process 30.1 cm   15 cm Proximal to Ulnar Styloid Process 30.6 cm   10 cm Proximal to Ulnar Styloid Process 27.5 cm   Just Proximal to Ulnar Styloid Process 19.6 cm   Across Hand at [employer] 20.2 cm   At [place] of 2nd Digit 7.5 cm                  [facility] Adult PT Treatment/Exercise - 01/26/17 0001      Shoulder Exercises: Pulleys   Flexion 2 minutes   ABduction 2 minutes     Manual Therapy   Manual Therapy Manual Lymphatic Drainage (MLD);Passive ROM   Manual Lymphatic Drainage (MLD) Short neck, five diaphragmatic breaths, left inguinal nodes and axillo-inguinal anastomosis, and left UE from fingers to shoulder working from proximal to distal then retracing all steps.                         Long Term Clinic Goals - 01/25/17 1215      CC Long Term Goal  #1   Title Left arm circumference at hand will reduce by 0.7 cm. to 21 cm.   Baseline 21.7 at eval; 20.2 cm-01/25/17   Status Achieved     CC Long Term Goal  #2   Title Pt. will be independent in self-care for lymphedema.   Status On-going     CC Long Term Goal  #3   Title Patient will have been fitted for a compression sleeve and glove for left UE   Baseline Pt gets measured tomorrow-01/25/17   Status On-going     CC Long Term Goal  #4   Title Pt. will report at least 50% decrease in left UE pain at the end of the day compared to initial eval today   Baseline up  to 8/10 at eval; as of 01/03/2017, still having ups  and downs depending on what she does with the arm--so worse today because of cooking yesterday; improving overall as pts A/ROM has improved but did not rate today-01/25/17   Status On-going     CC Long Term Goal  #5   Title lymphedema life impact scale score reduced to 35% impairment or less   Baseline score of 45 with 16 questions answered = 70% impairment at eval   Status On-going            Plan - 01/26/17 1217    Clinical Impression Statement Had pt measured for compression garment during session today then finished session with manual lymph drainage. Pts garment should arrive within 2 weeks.    Rehab Potential Good   PT Frequency 2x / week   PT Duration 4 weeks   PT Treatment/Interventions ADLs/Self Care Home Management;Therapeutic exercise;Patient/family education;Orthotic Fit/Training;Manual techniques;Manual lymph drainage;Compression bandaging;Passive range of motion;Taping   PT Next Visit Plan Have pt retake LLIS for goal assess. Cont manual lymph drainage to left UE (only using Lt axillo-inguinal anastomosis due to Rt SLNB);  Include left shoulder ROM and strengthening as time permits.   Consulted and Agree with Plan of Care Patient      Patient will benefit from skilled therapeutic intervention in order to improve the following deficits and impairments:  Increased edema, Pain, Decreased strength, Decreased range of motion  Visit Diagnosis: Lymphedema, not elsewhere classified  Pain in left arm  Stiffness of left shoulder, not elsewhere classified  Left shoulder pain, unspecified chronicity  Localized edema     Problem List Patient Active Problem List   Diagnosis Date Noted  . Lymphedema of right upper extremity 12/07/2016  . BMI 40.0-44.9, adult ([place]) 06/22/2016  . S/P shoulder replacement 03/04/2016  . Constipation 12/16/2014  . Chemotherapy-induced nausea 12/16/2014  . Hyperglycemia 12/09/2014  .  Internal jugular vein thrombosis ([place])   . Leukocytosis   . DVT (deep venous thrombosis) ([place]) 12/02/2014  . Osteoarthritis of both feet 11/26/2014  . Poor venous access   . Colorectal cancer ([place]) 10/03/2014  . Malignant neoplasm of lower-inner quadrant of right breast of female, estrogen receptor negative ([place]) 09/27/2014  . Anemia, iron deficiency 04/25/2013  . GERD (gastroesophageal reflux disease) 02/12/2013  . HTN (hypertension) 10/21/2011  . Hypothyroidism 10/21/2011  . Depression 10/21/2011  . Hyperlipidemia 10/21/2011  . Lung nodules 10/21/2011  . Vertigo 10/21/2011  . Vitamin D deficiency 10/21/2011    Otelia Limes, PTA 01/26/2017, 12:23 PM  [place], [place], [address] Phone: [phone]   Fax:  [phone]  Name: Jordan [name] MRN: [id number] Date of Birth: Jul 15, 1951

## 2017-01-27 ENCOUNTER — Encounter: Payer: Self-pay | Admitting: Family Medicine

## 2017-01-27 ENCOUNTER — Ambulatory Visit (INDEPENDENT_AMBULATORY_CARE_PROVIDER_SITE_OTHER): Payer: Medicare Other | Admitting: Family Medicine

## 2017-01-27 ENCOUNTER — Ambulatory Visit (INDEPENDENT_AMBULATORY_CARE_PROVIDER_SITE_OTHER): Payer: Medicare Other

## 2017-01-27 VITALS — BP 117/76 | HR 86 | Temp 97.9°F | Resp 18 | Ht 61.5 in | Wt 222.2 lb

## 2017-01-27 DIAGNOSIS — L03116 Cellulitis of left lower limb: Secondary | ICD-10-CM

## 2017-01-27 DIAGNOSIS — R05 Cough: Secondary | ICD-10-CM | POA: Diagnosis not present

## 2017-01-27 DIAGNOSIS — R079 Chest pain, unspecified: Secondary | ICD-10-CM | POA: Diagnosis not present

## 2017-01-27 MED ORDER — DOXYCYCLINE HYCLATE 100 MG PO TABS: 100.0000 mg | ORAL_TABLET | Freq: Two times a day (BID) | ORAL | 0 refills | Status: DC

## 2017-01-27 NOTE — Progress Notes (Signed)
Jordan [name] is a 66 y.o. female who presents to Primary Care at [facility] today for bug bite:  1.  Bug bite:  Patient noticed purulent bump on lateral aspect of left calf on Saturday or Sunday. She did not call breath and had some purulent drainage from the area. However over the past 4 days she has had continued redness and itching around the spot. She has tried an over-the-counter cream without any relief. She's had no fevers or chills. No nausea Or vomiting. She is eating and drinking well. She has felt some warmth around the area. No swelling to her calf. No recent travel.  2.  Chest pain:  At the conclusion of our visit, she grabbed her right breast and began expressing chest pain. She states is been going on for the past 1-2 weeks. Happens 2-3 times a day. It lasts for about 2-3 minutes it was like a squeezing pain in her right breast and then resolves. She has no chest pain or dyspnea on exertion. No palpitations. No lower extremity edema. No recent travel as above.  She does have history of breast cancer treated with radiation on the right side. She has been under a large amount of stress recently. She is trying to leave her husband but has been able to get the finances for this. She is still living at home with her husband. She had her father passed away suddenly in Oct 23, 2022 and that her mother passed away just as suddenly about 3 weeks ago. Both of them are living at home. She denies any depression. She says she has a strong support system.  No suicidal or homicidal ideation.  She also has a past medical history for GERD.  This is untreated currently.    ROS as above.     PMH reviewed. Patient is a nonsmoker.   Past Medical History:  Diagnosis Date  . Anemia   . Arthritis    oa left shoulder  . Back pain    buldging disc  . Blood clot of neck vein   . Breast cancer ([place]) 10/17/2014   lower inner quadrant of the right breast  . Cancer ([place])    colon  . Constipation   .  Depression    takes Wellbutrin daily  . Dizziness    r/t side effects from meds  . Family history of adverse reaction to anesthesia    It is hard for my mother to wake up from anesthesia.  . Generalized headaches    due to allergies, sinus  . GERD (gastroesophageal reflux disease)    takes Protonix as needed  . Hemorrhoids   . History of bronchitis    last time about 6-21yrs ago  . History of colon polyps   . History of hiatal hernia   . History of migraine    last one about 15+yrs ago  . History of UTI   . Hx of seasonal allergies    takes OTC allergy med nightly  . Hyperlipidemia    takes Zetia and Zocor daily  . Hypertension    takes Maxzide and Metoprolol daily  . Hypothyroidism    takes Synthroid daily  . Joint pain   . Leg swelling   . Mass of colon   . Nasal congestion   . Nausea   . Pneumonia    walking about 6-22yrs ago  . PONV (postoperative nausea and vomiting)    pt states she is very easy to sedate  . S/P  radiation therapy 03/11/2015 through 04/25/2015    Right breast 4500 cGy in 25 sessions, right breast boost 1600 cGy in 8 sessions   . Thyroid disease   . Tuberculosis    as little girl    . Vertigo    takes Meclizine prn  . Wears glasses   . Wears glasses    Past Surgical History:  Procedure Laterality Date  . ABDOMINAL HYSTERECTOMY    . APPENDECTOMY    . CARPAL TUNNEL RELEASE    . [place]  . COLONOSCOPY    . ESOPHAGOGASTRODUODENOSCOPY    . EXPLORATORY LAPAROTOMY    . KNEE SURGERY  1998   right - arthroscopic  . PARTIAL COLECTOMY  03/30/2012   Procedure: PARTIAL COLECTOMY;  Surgeon: [name], MD;  Location: [place];  Service: General;  Laterality: N/A;  . PORT-A-CATH REMOVAL N/A 06/25/2015   Procedure: REMOVAL PORT-A-CATH;  Surgeon: [name], MD;  Location: [place];  Service: General;  Laterality: N/A;  . PORTACATH PLACEMENT    . RADIOACTIVE SEED  GUIDED MASTECTOMY WITH AXILLARY SENTINEL LYMPH NODE BIOPSY Right 10/17/2014   Procedure: RADIOACTIVE SEED GUIDED PARTIAL MASTECTOMY WITH AXILLARY SENTINEL LYMPH NODE BIOPSY;  Surgeon: [name], MD;  Location: [place];  Service: General;  Laterality: Right;  . TOTAL SHOULDER ARTHROPLASTY Left 03/04/2016   Procedure: LEFT TOTAL SHOULDER ARTHROPLASTY;  Surgeon: [name], MD;  Location: [place];  Service: Orthopedics;  Laterality: Left;    Medications reviewed. Current Outpatient Prescriptions  Medication Sig Dispense Refill  . aspirin 81 MG tablet Take 81 mg by mouth daily.    Marland Kitchen buPROPion (WELLBUTRIN XL) 150 MG 24 hr tablet TAKE 1 TABLET BY MOUTH EVERY DAY 90 tablet 1  . diphenhydrAMINE (BENADRYL) 25 MG tablet Take 25 mg by mouth daily as needed for allergies.    Marland Kitchen levothyroxine (SYNTHROID, LEVOTHROID) 112 MCG tablet Take 1 tablet (112 mcg total) by mouth daily. 90 tablet 3  . levothyroxine (SYNTHROID, LEVOTHROID) 125 MCG tablet TAKE 1 TABLET BY MOUTH EVERY DAY 30 tablet 5  . meloxicam (MOBIC) 15 MG tablet TAKE 1 TABLET BY MOUTH EVERY DAY 30 tablet 5  . nitroGLYCERIN (NITROSTAT) 0.4 MG SL tablet Place 1 tablet (0.4 mg total) under the tongue every 5 (five) minutes as needed for chest pain. 30 tablet 1  . ondansetron (ZOFRAN) 4 MG tablet Take 1 tablet (4 mg total) by mouth every 8 (eight) hours as needed for nausea or vomiting. 20 tablet 0  . oxyCODONE-acetaminophen (PERCOCET) 5-325 MG tablet Take 1-2 tablets by mouth every 4 (four) hours as needed. 60 tablet 0  . Prenatal Multivit-Min-Fe-FA (PRENATAL VITAMINS PO) Take 1 tablet by mouth daily.     Marland Kitchen triamterene-hydrochlorothiazide (MAXZIDE-25) 37.5-25 MG tablet Take 1 tablet by mouth daily. 90 tablet 3  . ezetimibe (ZETIA) 10 MG tablet Take 1 tablet (10 mg total) by mouth daily. (Patient not taking: Reported on 12/13/2016) 30 tablet 5   No current facility-administered medications for this visit.    Facility-Administered  Medications Ordered in Other Visits  Medication Dose Route Frequency Provider Last Rate Last Dose  . chlorhexidine (HIBICLENS) 4 % liquid 1 application  1 application Topical Once Jovita Kussmaul, MD         Physical Exam:  BP 117/76 (BP Location: Left Arm, Patient Position: Sitting, Cuff Size: Normal)   Pulse 86   Temp 97.9 F (36.6 C) (Oral)   Resp 18  Ht 5' 1.5" (1.562 m)   Wt 222 lb 3.2 oz (100.8 kg)   SpO2 99%   BMI 41.30 kg/m  Gen:  Alert, cooperative patient who appears stated age in no acute distress.  Vital signs reviewed. HEENT: EOMI,  MMM Pulm:  Clear to auscultation bilaterally with good air movement.  No wheezes or rales noted.   Cardiac:  Regular rate and rhythm without murmur auscultated.  Good S1/S2. Chest:  Nontender on palpation.  Abd:  Soft/nondistended/nontender.   Exts: Trace edema BL ankles.  She has 5 cm in diameter area of erythema lateral aspect of left calf.  Not bulls-eye/targetoid lesion.  With healing eschar in center of this area of redness consistent with drained abscess.  Minimal heat today.  No fluctuance.  No induration.  No other redness or swelling of calf that would indicate LE DVT  Assessment and Plan:  1.  Cellulitis: - with self-expressed abscess - treating with doxycycline - no evidence of bug bite.   - if there was a tick for some reason, doxy would cover for this (pt concerned about this)  2. Chest pain: - most likely either stress-induced or GERD.  Has been under significant stressors recently.   - EKG with no changes from prior.  CXR negative. - no recent travel.  Patient otherwise looks very well.  Under experienced fleeting pain, was sitting up right, conversing, laughing.  Not tachypneic or tachycardic.  Trace BL LE edema, but not evidence of DVT and PE highly unlikely based on pt appearance, exam, vital signs, etc. - reassurance provided.  Recommended to start OTC treatment for GERD.   - FU if she feels stress becoming more than  she can manage with her current support system.  She declined any FU for this for now.

## 2017-01-27 NOTE — Patient Instructions (Addendum)
Like we discussed, I think your chest pain is from either esophageal spasm or pains from the radiation.  Stress can also cause this.  For the spot on your leg, this is developing in a skin infection called cellulitis.    The treatment for this is doxycyline.  Take this twice daily for the next 10 days.    If you start to notice any worsening redness, drainage, fevers and chills come back and see [place].     Cellulitis, Adult Cellulitis is a skin infection. The infected area is usually red and sore. This condition occurs most often in the arms and lower legs. It is very important to get treated for this condition. Follow these instructions at home:  Take over-the-counter and prescription medicines only as told by your doctor.  If you were prescribed an antibiotic medicine, take it as told by your doctor. Do not stop taking the antibiotic even if you start to feel better.  Drink enough fluid to keep your pee (urine) clear or pale yellow.  Do not touch or rub the infected area.  Raise (elevate) the infected area above the level of your heart while you are sitting or lying down.  Place warm or cold wet cloths (warm or cold compresses) on the infected area. Do this as told by your doctor.  Keep all follow-up visits as told by your doctor. This is important. These visits let your doctor make sure your infection is not getting worse. Contact a doctor if:  You have a fever.  Your symptoms do not get better after 1-2 days of treatment.  Your bone or joint under the infected area starts to hurt after the skin has healed.  Your infection comes back. This can happen in the same area or another area.  You have a swollen bump in the infected area.  You have new symptoms.  You feel ill and also have muscle aches and pains. Get help right away if:  Your symptoms get worse.  You feel very sleepy.  You throw up (vomit) or have watery poop (diarrhea) for a long time.  There are red streaks  coming from the infected area.  Your red area gets larger.  Your red area turns darker. This information is not intended to replace advice given to you by your health care provider. Make sure you discuss any questions you have with your health care provider. Document Released: 01/19/2008 Document Revised: 01/08/2016 Document Reviewed: 06/11/2015 Elsevier Interactive Patient Education  2018 [employer].     IF you received an x-ray today, you will receive an invoice from [facility]. Please contact [facility] at [phone] with questions or concerns regarding your invoice.   IF you received labwork today, you will receive an invoice from [facility]. Please contact [facility] at [phone] with questions or concerns regarding your invoice.   Our billing staff will not be able to assist you with questions regarding bills from these companies.  You will be contacted with the lab results as soon as they are available. The fastest way to get your results is to activate your My Chart account. Instructions are located on the last page of this paperwork. If you have not heard from [place] regarding the results in 2 weeks, please contact this office.

## 2017-02-01 ENCOUNTER — Ambulatory Visit: Payer: Medicare Other

## 2017-02-01 DIAGNOSIS — R600 Localized edema: Secondary | ICD-10-CM | POA: Diagnosis not present

## 2017-02-01 DIAGNOSIS — I890 Lymphedema, not elsewhere classified: Secondary | ICD-10-CM

## 2017-02-01 DIAGNOSIS — M25512 Pain in left shoulder: Secondary | ICD-10-CM

## 2017-02-01 DIAGNOSIS — M25612 Stiffness of left shoulder, not elsewhere classified: Secondary | ICD-10-CM | POA: Diagnosis not present

## 2017-02-01 DIAGNOSIS — M79602 Pain in left arm: Secondary | ICD-10-CM | POA: Diagnosis not present

## 2017-02-01 NOTE — Therapy (Signed)
[place], [place], [address] Phone: [phone]   Fax:  [phone]  Physical Therapy Treatment  Patient Details  Name: Jordan [name] MRN: [id number] Date of Birth: March 26, 1951 Referring Provider: Harrison Mons, PA-C  Encounter Date: 02/01/2017      PT End of Session - 02/01/17 1159    Visit Number 8   Number of Visits 17   Date for PT Re-Evaluation 02/22/17   PT Start Time 1109   PT Stop Time 1154   PT Time Calculation (min) 45 min   Activity Tolerance Patient tolerated treatment well   Behavior During Therapy [facility] for tasks assessed/performed      Past Medical History:  Diagnosis Date  . Anemia   . Arthritis    oa left shoulder  . Back pain    buldging disc  . Blood clot of neck vein   . Breast cancer ([place]) 10/17/2014   lower inner quadrant of the right breast  . Cancer ([place])    colon  . Constipation   . Depression    takes Wellbutrin daily  . Dizziness    r/t side effects from meds  . Family history of adverse reaction to anesthesia    It is hard for my mother to wake up from anesthesia.  . Generalized headaches    due to allergies, sinus  . GERD (gastroesophageal reflux disease)    takes Protonix as needed  . Hemorrhoids   . History of bronchitis    last time about 6-73yr ago  . History of colon polyps   . History of hiatal hernia   . History of migraine    last one about 15+yrs ago  . History of UTI   . Hx of seasonal allergies    takes OTC allergy med nightly  . Hyperlipidemia    takes Zetia and Zocor daily  . Hypertension    takes Maxzide and Metoprolol daily  . Hypothyroidism    takes Synthroid daily  . Joint pain   . Leg swelling   . Mass of colon   . Nasal congestion   . Nausea   . Pneumonia    walking about [place]  . PONV (postoperative nausea and vomiting)    pt states she is very easy to sedate  . S/P radiation therapy 03/11/2015 through  04/25/2015    Right breast 4500 cGy in 25 sessions, right breast boost 1600 cGy in 8 sessions   . Thyroid disease   . Tuberculosis    as little girl    . Vertigo    takes Meclizine prn  . Wears glasses   . Wears glasses     Past Surgical History:  Procedure Laterality Date  . ABDOMINAL HYSTERECTOMY    . APPENDECTOMY    . [place]    . ESOPHAGOGASTRODUODENOSCOPY    . EXPLORATORY LAPAROTOMY    . KNEE SURGERY  1998   right - arthroscopic  . PARTIAL COLECTOMY  03/30/2012   Procedure: PARTIAL COLECTOMY;  Surgeon: JaGwenyth OberMD;  [place] Service: General;  Laterality: N/A;  . PORT-A-CATH REMOVAL N/A 06/25/2015   Procedure: REMOVAL PORT-A-CATH;  Surgeon: PaAutumn MessingII, MD;  [facility] Service: General;  Laterality: N/A;  . PORTACATH PLACEMENT    . RADIOACTIVE SEED GUIDED MASTECTOMY WITH AXILLARY SENTINEL LYMPH NODE BIOPSY Right 10/17/2014   Procedure:  RADIOACTIVE SEED GUIDED PARTIAL MASTECTOMY WITH AXILLARY SENTINEL LYMPH NODE BIOPSY;  Surgeon: [name], MD;  Location: [place];  Service: General;  Laterality: Right;  . TOTAL SHOULDER ARTHROPLASTY Left 03/04/2016   Procedure: LEFT TOTAL SHOULDER ARTHROPLASTY;  Surgeon: [name], MD;  Location: [place];  Service: Orthopedics;  Laterality: Left;    There were no vitals filed for this visit.      Subjective Assessment - 02/01/17 1113    Subjective I went to Urgent Care since I was here last for the spot on my leg and he wasn't sure what it was but put me on the regiment for a tick bite. So I'm on a new antibiotic for that and it looks alot better than it did last week.    Pertinent History Right breast cancer diagnosed January 2016 with lumpectomy, then chemo, then radiation (finished all 04/23/15 approx.).  Left total shoulder replacement 03/04/16 approx.  HTN  controlled with meds.  Cleaning and helping her mother transfer caused left arm aggravation, with whole arm swelling.   Patient Stated Goals get rid of the pain and swelling   Currently in Pain? No/denies            [facility] PT Assessment - 02/01/17 0001      Observation/Other Assessments   Other Surveys  --  LLIS score 25; 37% impaired     AROM   Left Shoulder Flexion 154 Degrees   Left Shoulder ABduction 118 Degrees           LYMPHEDEMA/ONCOLOGY QUESTIONNAIRE - 02/01/17 1149      Left Upper Extremity Lymphedema   15 cm Proximal to Olecranon Process 38.2 cm   10 cm Proximal to Olecranon Process 36.3 cm   Olecranon Process 29.7 cm   15 cm Proximal to Ulnar Styloid Process 30.6 cm   10 cm Proximal to Ulnar Styloid Process 27.5 cm   Just Proximal to Ulnar Styloid Process 19.6 cm   Across Hand at [employer] 20.7 cm   At [place] of 2nd Digit 7.2 cm                  [facility] Adult PT Treatment/Exercise - 02/01/17 0001      Manual Therapy   Manual Therapy Manual Lymphatic Drainage (MLD);Passive ROM   Manual Lymphatic Drainage (MLD) Short neck, five diaphragmatic breaths, left inguinal nodes and axillo-inguinal anastomosis, and left UE from fingers to shoulder working from proximal to distal then retracing all steps.    Passive ROM Lt shoulder into flexion, abduction, and er to pts tolerance.                        Long Term Clinic Goals - 02/01/17 1201      CC Long Term Goal  #3   Title Patient will have been fitted for a compression sleeve and glove for left UE   Status Achieved     CC Long Term Goal  #5   Title lymphedema life impact scale score reduced to 35% impairment or less   Baseline score of 45 with 16 questions answered = 70% impairment at eval; scored 25 today=37% impaired-02/01/17   Status Partially Met            Plan - 02/01/17 1202    Clinical Impression Statement Pts A/ROM has improved since last time measured and overall   her circumference has decreased in her Lt UE as well except at  hand (0.5 cm increase). She was measured for her compression garments last week so met that goal and her Lypmedema Life Impact Score has dramatically imporved from 70% impaired to only 37% now almost meeting that goal (goal is 35%). Pt is porgressing well towards goals ad should be ready to D/C once her garments arrive and per POC.    Rehab Potential Good   PT Frequency 2x / week   PT Duration 4 weeks   PT Treatment/Interventions ADLs/Self Care Home Management;Therapeutic exercise;Patient/family education;Orthotic Fit/Training;Manual techniques;Manual lymph drainage;Compression bandaging;Passive range of motion;Taping   PT Next Visit Plan Cont manual lymph drainage to left UE (only using Lt axillo-inguinal anastomosis due to Rt SLNB);  Include left shoulder ROM and strengthening as time permits.   Consulted and Agree with Plan of Care Patient      Patient will benefit from skilled therapeutic intervention in order to improve the following deficits and impairments:  Increased edema, Pain, Decreased strength, Decreased range of motion  Visit Diagnosis: Lymphedema, not elsewhere classified  Pain in left arm  Stiffness of left shoulder, not elsewhere classified  Left shoulder pain, unspecified chronicity     Problem List Patient Active Problem List   Diagnosis Date Noted  . Lymphedema of right upper extremity 12/07/2016  . BMI 40.0-44.9, adult ([place]) 06/22/2016  . S/P shoulder replacement 03/04/2016  . Constipation 12/16/2014  . Chemotherapy-induced nausea 12/16/2014  . Hyperglycemia 12/09/2014  . 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 Leukocytosis   . DVT (deep venous thrombosis) ([place]) 12/02/2014  . Osteoarthritis of both feet 11/26/2014  . Poor venous access   . Colorectal cancer ([place]) 10/03/2014  . Malignant neoplasm of lower-inner quadrant of right breast of female, estrogen receptor negative ([place]) 09/27/2014  .  Anemia, iron deficiency 04/25/2013  . GERD (gastroesophageal reflux disease) 02/12/2013  . HTN (hypertension) 10/21/2011  . Hypothyroidism 10/21/2011  . Depression 10/21/2011  . Hyperlipidemia 10/21/2011  . Lung nodules 10/21/2011  . Vertigo 10/21/2011  . Vitamin D deficiency 10/21/2011    Otelia Limes, PTA 02/01/2017, 12:08 PM  [place], [place], [address] Phone: [phone]   Fax:  [phone]  Name: KARALYN KADEL MRN: [id number] Date of Birth: May 04, 1951

## 2017-02-02 ENCOUNTER — Ambulatory Visit: Payer: Medicare Other

## 2017-02-02 DIAGNOSIS — I890 Lymphedema, not elsewhere classified: Secondary | ICD-10-CM

## 2017-02-02 DIAGNOSIS — M25612 Stiffness of left shoulder, not elsewhere classified: Secondary | ICD-10-CM | POA: Diagnosis not present

## 2017-02-02 DIAGNOSIS — M25512 Pain in left shoulder: Secondary | ICD-10-CM | POA: Diagnosis not present

## 2017-02-02 DIAGNOSIS — M79602 Pain in left arm: Secondary | ICD-10-CM

## 2017-02-02 DIAGNOSIS — R600 Localized edema: Secondary | ICD-10-CM | POA: Diagnosis not present

## 2017-02-02 NOTE — Therapy (Signed)
[place], [place], [address] Phone: [phone]   Fax:  [phone]  Physical Therapy Treatment  Patient Details  Name: Jordan [name] MRN: [id number] Date of Birth: [birth year] Referring Provider: Harrison Mons, PA-C  Encounter Date: 02/02/2017      PT End of Session - 02/02/17 1201    Visit Number 9   Number of Visits 17   Date for PT Re-Evaluation 02/22/17   PT Start Time 1101   PT Stop Time 1149   PT Time Calculation (min) 48 min   Activity Tolerance Patient tolerated treatment well   Behavior During Therapy [facility] for tasks assessed/performed      Past Medical History:  Diagnosis Date  . Anemia   . Arthritis    oa left shoulder  . Back pain    buldging disc  . Blood clot of neck vein   . Breast cancer ([place]) 10/17/2014   lower inner quadrant of the right breast  . Cancer ([place])    colon  . Constipation   . Depression    takes Wellbutrin daily  . Dizziness    r/t side effects from meds  . Family history of adverse reaction to anesthesia    It is hard for my mother to wake up from anesthesia.  . Generalized headaches    due to allergies, sinus  . GERD (gastroesophageal reflux disease)    takes Protonix as needed  . Hemorrhoids   . History of bronchitis    last time about 6-73yr ago  . History of colon polyps   . History of hiatal hernia   . History of migraine    last one about 15+yrs ago  . History of UTI   . Hx of seasonal allergies    takes OTC allergy med nightly  . Hyperlipidemia    takes Zetia and Zocor daily  . Hypertension    takes Maxzide and Metoprolol daily  . Hypothyroidism    takes Synthroid daily  . Joint pain   . Leg swelling   . Mass of colon   . Nasal congestion   . Nausea   . Pneumonia    walking about 6-756yrago  . PONV (postoperative nausea and vomiting)    pt states she is very easy to sedate  . S/P radiation therapy 03/11/2015 through  04/25/2015    Right breast 4500 cGy in 25 sessions, right breast boost 1600 cGy in 8 sessions   . Thyroid disease   . Tuberculosis    as little girl    . Vertigo    takes Meclizine prn  . Wears glasses   . Wears glasses     Past Surgical History:  Procedure Laterality Date  . ABDOMINAL HYSTERECTOMY    . APPENDECTOMY    . CARPAL TUNNEL RELEASE    . [place]. COLONOSCOPY    . ESOPHAGOGASTRODUODENOSCOPY    . EXPLORATORY LAPAROTOMY    . KNEE SURGERY  1998   right - arthroscopic  . PARTIAL COLECTOMY  03/30/2012   Procedure: PARTIAL COLECTOMY;  Surgeon: JaGwenyth OberMD;  [facility] Service: General;  Laterality: N/A;  . PORT-A-CATH REMOVAL N/A 06/25/2015   Procedure: REMOVAL PORT-A-CATH;  Surgeon: PaAutumn MessingII, MD;  [facility]: General;  Laterality: N/A;  . PORTACATH PLACEMENT    . RADIOACTIVE SEED GUIDED MASTECTOMY WITH AXILLARY SENTINEL LYMPH NODE BIOPSY Right 10/17/2014   Procedure:  RADIOACTIVE SEED GUIDED PARTIAL MASTECTOMY WITH AXILLARY SENTINEL LYMPH NODE BIOPSY;  Surgeon: [name], MD;  Location: [place];  Service: General;  Laterality: Right;  . TOTAL SHOULDER ARTHROPLASTY Left 03/04/2016   Procedure: LEFT TOTAL SHOULDER ARTHROPLASTY;  Surgeon: [name], MD;  Location: [place];  Service: Orthopedics;  Laterality: Left;    There were no vitals filed for this visit.      Subjective Assessment - 02/02/17 1111    Subjective I finished the antibiotic for the infection I had on my leg from the bite. It seems to be healing well. I'm not having a good day, just remembering my momma and daddy alot today and my sister found out she has a mass in her pelvis. I'm just feeling overwhelmed.    Pertinent History Right breast cancer diagnosed January 2016 with lumpectomy, then chemo, then radiation (finished all 04/23/15 approx.).  Left total shoulder  replacement 03/04/16 approx.  HTN controlled with meds.  Cleaning and helping her mother transfer caused left arm aggravation, with whole arm swelling.   Patient Stated Goals get rid of the pain and swelling               LYMPHEDEMA/ONCOLOGY QUESTIONNAIRE - 02/01/17 1149      Left Upper Extremity Lymphedema   15 cm Proximal to Olecranon Process 38.2 cm   10 cm Proximal to Olecranon Process 36.3 cm   Olecranon Process 29.7 cm   15 cm Proximal to Ulnar Styloid Process 30.6 cm   10 cm Proximal to Ulnar Styloid Process 27.5 cm   Just Proximal to Ulnar Styloid Process 19.6 cm   Across Hand at [employer] 20.7 cm   At [place] of 2nd Digit 7.2 cm                  [facility] Adult PT Treatment/Exercise - 02/02/17 0001      Shoulder Exercises: ROM/Strengthening   Other ROM/Strengthening Exercises UE ranger seated in chair into flexion and abduction x 2 minutes each     Manual Therapy   Manual Therapy Manual Lymphatic Drainage (MLD);Passive ROM   Manual Lymphatic Drainage (MLD) Short neck, five diaphragmatic breaths, left inguinal nodes and axillo-inguinal anastomosis, and left UE from fingers to shoulder working from proximal to distal then retracing all steps.    Passive ROM Lt shoulder into flexion, abduction, and er to pts tolerance.                        Long Term Clinic Goals - 02/01/17 1201      CC Long Term Goal  #3   Title Patient will have been fitted for a compression sleeve and glove for left UE   Status Achieved     CC Long Term Goal  #5   Title lymphedema life impact scale score reduced to 35% impairment or less   Baseline score of 45 with 16 questions answered = 70% impairment at eval; scored 25 today=37% impaired-02/01/17   Status Partially Met            Plan - 02/02/17 1202    Clinical Impression Statement Pt came in and became tearful once in room reporting she was just having a bad day. Had been thinking alot about her recenelty  deceased parents and her sister also received some bad medical news (mass in her pelvis) so she just wasn't having a good day. She did tolerate stretching very well though and  did not have any increase pain at end of session. Her Lt UE circumference is also seeming to maintain with manual lymph drainage and pt trying not to overuse her arm at home with help form her granddaughter that lives with her.    Rehab Potential Good   PT Frequency 2x / week   PT Duration 4 weeks   PT Treatment/Interventions ADLs/Self Care Home Management;Therapeutic exercise;Patient/family education;Orthotic Fit/Training;Manual techniques;Manual lymph drainage;Compression bandaging;Passive range of motion;Taping   PT Next Visit Plan Gcode next visit. Cont manual lymph drainage to left UE (only using Lt axillo-inguinal anastomosis due to Rt SLNB);  Include left shoulder ROM and strengthening as time permits until garments arrive.    Consulted and Agree with Plan of Care Patient      Patient will benefit from skilled therapeutic intervention in order to improve the following deficits and impairments:  Increased edema, Pain, Decreased strength, Decreased range of motion  Visit Diagnosis: Lymphedema, not elsewhere classified  Pain in left arm  Stiffness of left shoulder, not elsewhere classified  Left shoulder pain, unspecified chronicity     Problem List Patient Active Problem List   Diagnosis Date Noted  . Lymphedema of right upper extremity 12/07/2016  . BMI 40.0-44.9, adult ([place]) 06/22/2016  . S/P shoulder replacement 03/04/2016  . Constipation 12/16/2014  . Chemotherapy-induced nausea 12/16/2014  . Hyperglycemia 12/09/2014  . Internal jugular vein thrombosis ([place])   . Leukocytosis   . DVT (deep venous thrombosis) ([place]) 12/02/2014  . Osteoarthritis of both feet 11/26/2014  . Poor venous access   . Colorectal cancer ([place]) 10/03/2014  . Malignant neoplasm of lower-inner quadrant of right breast of female,  estrogen receptor negative ([place]) 09/27/2014  . Anemia, iron deficiency 04/25/2013  . GERD (gastroesophageal reflux disease) 02/12/2013  . HTN (hypertension) 10/21/2011  . Hypothyroidism 10/21/2011  . Depression 10/21/2011  . Hyperlipidemia 10/21/2011  . Lung nodules 10/21/2011  . Vertigo 10/21/2011  . Vitamin D deficiency 10/21/2011    Otelia Limes, PTA 02/02/2017, 12:06 PM  [place], [place], [address] Phone: [phone]   Fax:  [phone]  Name: Jordan [name] MRN: [id number] Date of Birth: 1951-01-11

## 2017-02-03 ENCOUNTER — Encounter: Payer: Self-pay | Admitting: Physician Assistant

## 2017-02-03 ENCOUNTER — Ambulatory Visit (INDEPENDENT_AMBULATORY_CARE_PROVIDER_SITE_OTHER): Payer: Medicare Other | Admitting: Physician Assistant

## 2017-02-03 VITALS — BP 116/79 | HR 84 | Temp 98.6°F | Resp 16 | Ht 61.5 in | Wt 221.0 lb

## 2017-02-03 DIAGNOSIS — L989 Disorder of the skin and subcutaneous tissue, unspecified: Secondary | ICD-10-CM

## 2017-02-03 MED ORDER — MUPIROCIN 2 % EX OINT
1.0000 | TOPICAL_OINTMENT | Freq: Three times a day (TID) | CUTANEOUS | 0 refills | Status: DC
Start: 2017-02-03 — End: 2017-05-30

## 2017-02-03 NOTE — Patient Instructions (Signed)
     IF you received an x-ray today, you will receive an invoice from [facility]. Please contact [facility] at [phone] with questions or concerns regarding your invoice.   IF you received labwork today, you will receive an invoice from [facility]. Please contact [facility] at [phone] with questions or concerns regarding your invoice.   Our billing staff will not be able to assist you with questions regarding bills from these companies.  You will be contacted with the lab results as soon as they are available. The fastest way to get your results is to activate your My Chart account. Instructions are located on the last page of this paperwork. If you have not heard from us regarding the results in 2 weeks, please contact this office.     

## 2017-02-03 NOTE — Progress Notes (Signed)
Patient ID: Jordan [name], female    DOB: [birth year], 66 y.o.   MRN: [id number]  PCP: Harrison Mons, PA-C  Chief Complaint  Patient presents with  . Insect Bite    on left leg x 1 week     Subjective:   Presents for evaluation of a lesion on the LEFT lower leg.  She believes this to be an insect bite, though she did not see an insect. 1 week ago, she noticed a bump at the site because it was itchy.  It developed a pustule, and she squeezed it on her husband's advice, and took a bath in rubbing alcohol. She then developed a scab at the site.  The itching is resolved. There is mild tenderness. No drainage. No red streaking. No fever/chills.  She continues to recover from the recent death of her mother, her namesake. It has added to the drama among her siblings and relatives since her father died just a few months before.    Review of Systems As above.    Patient Active Problem List   Diagnosis Date Noted  . Lymphedema of right upper extremity 12/07/2016  . BMI 40.0-44.9, adult ([place]) 06/22/2016  . S/P shoulder replacement 03/04/2016  . Constipation 12/16/2014  . Chemotherapy-induced nausea 12/16/2014  . Hyperglycemia 12/09/2014  . Internal jugular vein thrombosis ([place])   . Leukocytosis   . DVT (deep venous thrombosis) ([place]) 12/02/2014  . Osteoarthritis of both feet 11/26/2014  . Poor venous access   . Colorectal cancer ([place]) 10/03/2014  . Malignant neoplasm of lower-inner quadrant of right breast of female, estrogen receptor negative ([place]) 09/27/2014  . Anemia, iron deficiency 04/25/2013  . GERD (gastroesophageal reflux disease) 02/12/2013  . HTN (hypertension) 10/21/2011  . Hypothyroidism 10/21/2011  . Depression 10/21/2011  . Hyperlipidemia 10/21/2011  . Lung nodules 10/21/2011  . Vertigo 10/21/2011  . Vitamin D deficiency 10/21/2011     Prior to Admission medications   Medication Sig Start Date End Date Taking? Authorizing Provider  aspirin 81  MG tablet Take 81 mg by mouth daily.   Yes [provider]  buPROPion (WELLBUTRIN XL) 150 MG 24 hr tablet TAKE 1 TABLET BY MOUTH EVERY DAY 08/25/16  Yes Leighton Brickley, PA-C  diphenhydrAMINE (BENADRYL) 25 MG tablet Take 25 mg by mouth daily as needed for allergies.   Yes [provider]  doxycycline (VIBRA-TABS) 100 MG tablet Take 1 tablet (100 mg total) by mouth 2 (two) times daily. 01/27/17  Yes Alveda Reasons, MD  levothyroxine (SYNTHROID, LEVOTHROID) 112 MCG tablet Take 1 tablet (112 mcg total) by mouth daily. 01/06/17  Yes Alayja Armas, PA-C  levothyroxine (SYNTHROID, LEVOTHROID) 125 MCG tablet TAKE 1 TABLET BY MOUTH EVERY DAY 07/18/16  Yes Shawn Carattini, PA-C  meloxicam (MOBIC) 15 MG tablet TAKE 1 TABLET BY MOUTH EVERY DAY 09/13/16  Yes Roshonda Sperl, PA-C  nitroGLYCERIN (NITROSTAT) 0.4 MG SL tablet Place 1 tablet (0.4 mg total) under the tongue every 5 (five) minutes as needed for chest pain. 11/23/12  Yes Kemarion Abbey, PA-C  ondansetron (ZOFRAN) 4 MG tablet Take 1 tablet (4 mg total) by mouth every 8 (eight) hours as needed for nausea or vomiting. 03/05/16  Yes Shuford, Olivia Mackie, PA-C  oxyCODONE-acetaminophen (PERCOCET) 5-325 MG tablet Take 1-2 tablets by mouth every 4 (four) hours as needed. 03/05/16  Yes Shuford, Olivia Mackie, PA-C  Prenatal Multivit-Min-Fe-FA (PRENATAL VITAMINS PO) Take 1 tablet by mouth daily.    Yes [provider]  triamterene-hydrochlorothiazide (MAXZIDE-25) 37.5-25  MG tablet Take 1 tablet by mouth daily. 12/07/16  Yes Briana Newman, PA-C     No Known Allergies     Objective:  Physical Exam  Constitutional: She is oriented to person, place, and time. She appears well-developed and well-nourished. She is active and cooperative. No distress.  BP 116/79   Pulse 84   Temp 98.6 F (37 C) (Oral)   Resp 16   Ht 5' 1.5" (1.562 m)   Wt 221 lb (100.2 kg)   SpO2 97%   BMI 41.08 kg/m    Eyes: Conjunctivae are normal.  Pulmonary/Chest:  Effort normal.  Neurological: She is alert and oriented to person, place, and time.  Skin: Skin is warm and dry. Lesion (LEFT lateral leg, minimal erythema. No induration. no drainage. Mildly tender. Thin scab.) noted. No rash noted.  Psychiatric: She has a normal mood and affect. Her speech is normal and behavior is normal.           Assessment & Plan:   1. Skin lesion of left leg Likely resolving cellulitis. Local wound care. Warm compresses. Topical antibiotic ointment.  - mupirocin ointment (BACTROBAN) 2 %; Apply 1 application topically 3 (three) times daily.  Dispense: 30 g; Refill: 0    Return if symptoms worsen or fail to improve.   Fara Chute, PA-C [facility]

## 2017-02-09 ENCOUNTER — Ambulatory Visit: Payer: Medicare Other

## 2017-02-09 DIAGNOSIS — M25512 Pain in left shoulder: Secondary | ICD-10-CM | POA: Diagnosis not present

## 2017-02-09 DIAGNOSIS — I890 Lymphedema, not elsewhere classified: Secondary | ICD-10-CM

## 2017-02-09 DIAGNOSIS — M79602 Pain in left arm: Secondary | ICD-10-CM | POA: Diagnosis not present

## 2017-02-09 DIAGNOSIS — M25612 Stiffness of left shoulder, not elsewhere classified: Secondary | ICD-10-CM

## 2017-02-09 DIAGNOSIS — R600 Localized edema: Secondary | ICD-10-CM | POA: Diagnosis not present

## 2017-02-09 NOTE — Therapy (Signed)
[place], [place], [address] Phone: [phone]   Fax:  [phone]  Physical Therapy Treatment  Patient Details  Name: Jordan [name] MRN: [id number] Date of Birth: 1950-12-28 Referring Provider: Harrison Mons, PA-C  Encounter Date: 02/09/2017      PT End of Session - 02/09/17 1158    Visit Number 10   Number of Visits 17   Date for PT Re-Evaluation 02/22/17   PT Start Time 1111   PT Stop Time 1156   PT Time Calculation (min) 45 min   Activity Tolerance Patient tolerated treatment well   Behavior During Therapy [facility] for tasks assessed/performed      Past Medical History:  Diagnosis Date  . Anemia   . Arthritis    oa left shoulder  . Back pain    buldging disc  . Blood clot of neck vein   . Breast cancer ([place]) 10/17/2014   lower inner quadrant of the right breast  . Cancer ([place])    colon  . Constipation   . Depression    takes Wellbutrin daily  . Dizziness    r/t side effects from meds  . Family history of adverse reaction to anesthesia    It is hard for my mother to wake up from anesthesia.  . Generalized headaches    due to allergies, sinus  . GERD (gastroesophageal reflux disease)    takes Protonix as needed  . Hemorrhoids   . History of bronchitis    last time about 6-65yr ago  . History of colon polyps   . History of hiatal hernia   . History of migraine    last one about 15+yrs ago  . History of UTI   . Hx of seasonal allergies    takes OTC allergy med nightly  . Hyperlipidemia    takes Zetia and Zocor daily  . Hypertension    takes Maxzide and Metoprolol daily  . Hypothyroidism    takes Synthroid daily  . Joint pain   . Leg swelling   . Mass of colon   . Nasal congestion   . Nausea   . Pneumonia    walking about 6-7106yrago  . PONV (postoperative nausea and vomiting)    pt states she is very easy to sedate  . S/P radiation therapy 03/11/2015 through  04/25/2015    Right breast 4500 cGy in 25 sessions, right breast boost 1600 cGy in 8 sessions   . Thyroid disease   . Tuberculosis    as little girl    . Vertigo    takes Meclizine prn  . Wears glasses   . Wears glasses     Past Surgical History:  Procedure Laterality Date  . ABDOMINAL HYSTERECTOMY    . APPENDECTOMY    . CARPAL TUNNEL RELEASE    . [place] COLON[place]    . ESOPHAGOGASTRODUODENOSCOPY    . EXPLORATORY LAPAROTOMY    . KNEE SURGERY  1998   right - arthroscopic  . PARTIAL COLECTOMY  03/30/2012   Procedure: PARTIAL COLECTOMY;  Surgeon: JaGwenyth OberMD;  [facility] Service: General;  Laterality: N/A;  . PORT-A-CATH REMOVAL N/A 06/25/2015   Procedure: REMOVAL PORT-A-CATH;  Surgeon: PaAutumn MessingII, MD;  [facility]: General;  Laterality: N/A;  . PORTACATH PLACEMENT    . RADIOACTIVE SEED GUIDED MASTECTOMY WITH AXILLARY SENTINEL LYMPH NODE BIOPSY Right 10/17/2014   Procedure:  RADIOACTIVE SEED GUIDED PARTIAL MASTECTOMY WITH AXILLARY SENTINEL LYMPH NODE BIOPSY;  Surgeon: [name], MD;  Location: [facility];  Service: General;  Laterality: Right;  . TOTAL SHOULDER ARTHROPLASTY Left 03/04/2016   Procedure: LEFT TOTAL SHOULDER ARTHROPLASTY;  Surgeon: [name], MD;  Location: [facility];  Service: Orthopedics;  Laterality: Left;    There were no vitals filed for this visit.      Subjective Assessment - 02/09/17 1117    Subjective My sister had surgery to remove a pelvic tumor and it was benign which was good news, but my brother who has had stage IV cancer for awhile, they said they can't do anything else for them. So I've been stressed lately and my shoulder is hurting today.   Pertinent History Right breast cancer diagnosed January 2016 with lumpectomy, then chemo, then radiation (finished all 04/23/15 approx.).  Left total shoulder replacement  03/04/16 approx.  HTN controlled with meds.  Cleaning and helping her mother transfer caused left arm aggravation, with whole arm swelling.   Patient Stated Goals get rid of the pain and swelling   Currently in Pain? Yes   Pain Score 3    Pain Location Shoulder   Pain Orientation Left   Pain Descriptors / Indicators Aching   Pain Type Chronic pain   Pain Onset More than a month ago   Pain Frequency Intermittent   Aggravating Factors  I think its the weather bothering me today and stress   Pain Relieving Factors resting it               LYMPHEDEMA/ONCOLOGY QUESTIONNAIRE - 02/09/17 1124      Left Upper Extremity Lymphedema   15 cm Proximal to Olecranon Process 37.7 cm   10 cm Proximal to Olecranon Process 37.5 cm   Olecranon Process 29.8 cm   15 cm Proximal to Ulnar Styloid Process 30.3 cm   10 cm Proximal to Ulnar Styloid Process 28.2 cm   Just Proximal to Ulnar Styloid Process 19.9 cm   Across Hand at [employer] 21.2 cm   At [place] of 2nd Digit 7.2 cm                  [facility] Adult PT Treatment/Exercise - 02/09/17 0001      Shoulder Exercises: Pulleys   Flexion 2 minutes   Flexion Limitations 2   ABduction 2 minutes   ABduction Limitations VC to decrease Lt shoulder hike     Shoulder Exercises: Therapy Ball   Flexion 10 reps  With forward lean into end of stretch     Manual Therapy   Manual Therapy Manual Lymphatic Drainage (MLD)   Manual Lymphatic Drainage (MLD) Short neck, five diaphragmatic breaths, left inguinal nodes and axillo-inguinal anastomosis, and left UE from fingers to shoulder working from proximal to distal then retracing all steps.                         Long Term Clinic Goals - 02/01/17 1201      CC Long Term Goal  #3   Title Patient will have been fitted for a compression sleeve and glove for left UE   Status Achieved     CC Long Term Goal  #5   Title lymphedema life impact scale score reduced to 35% impairment  or less   Baseline score of 45 with 16 questions answered = 70% impairment at eval; scored 25 today=37% impaired-02/01/17  Status Partially Met            Plan - 02/09/17 1159    Clinical Impression Statement Pts circumference measurements were slightly increased today so focused on manual lymph drainage after gentle AA/ROM exericses. She has been measured for compression garments and these should be arriving within next week. Gcode done this visit.    Rehab Potential Good   PT Frequency 2x / week   PT Duration 4 weeks   PT Treatment/Interventions ADLs/Self Care Home Management;Therapeutic exercise;Patient/family education;Orthotic Fit/Training;Manual techniques;Manual lymph drainage;Compression bandaging;Passive range of motion;Taping   PT Next Visit Plan Cont manual lymph drainage to left UE (only using Lt axillo-inguinal anastomosis due to Rt SLNB);  Include left shoulder ROM and strengthening as time permits until garments arrive.    Consulted and Agree with Plan of Care Patient      Patient will benefit from skilled therapeutic intervention in order to improve the following deficits and impairments:  Increased edema, Pain, Decreased strength, Decreased range of motion  Visit Diagnosis: Lymphedema, not elsewhere classified  Pain in left arm  Stiffness of left shoulder, not elsewhere classified  Left shoulder pain, unspecified chronicity     Problem List Patient Active Problem List   Diagnosis Date Noted  . Lymphedema of right upper extremity 12/07/2016  . BMI 40.0-44.9, adult ([place]) 06/22/2016  . S/P shoulder replacement 03/04/2016  . Constipation 12/16/2014  . Chemotherapy-induced nausea 12/16/2014  . Hyperglycemia 12/09/2014  . Internal jugular vein thrombosis ([place])   . Leukocytosis   . DVT (deep venous thrombosis) ([place]) 12/02/2014  . Osteoarthritis of both feet 11/26/2014  . Poor venous access   . Colorectal cancer ([place]) 10/03/2014  . Malignant neoplasm of  lower-inner quadrant of right breast of female, estrogen receptor negative ([place]) 09/27/2014  . Anemia, iron deficiency 04/25/2013  . GERD (gastroesophageal reflux disease) 02/12/2013  . HTN (hypertension) 10/21/2011  . Hypothyroidism 10/21/2011  . Depression 10/21/2011  . Hyperlipidemia 10/21/2011  . Lung nodules 10/21/2011  . Vertigo 10/21/2011  . Vitamin D deficiency 10/21/2011    Otelia Limes, PTA 02/09/2017, 12:03 PM  [place], [place], [address] Phone: [phone]   Fax:  [phone]  Name: Jordan [name] MRN: [id number] Date of Birth: Mar 29, 1951  Serafina Royals, PT 02/09/17 12:07 PM

## 2017-02-12 ENCOUNTER — Other Ambulatory Visit: Payer: Self-pay | Admitting: Physician Assistant

## 2017-02-12 DIAGNOSIS — E785 Hyperlipidemia, unspecified: Secondary | ICD-10-CM

## 2017-02-15 ENCOUNTER — Ambulatory Visit: Payer: Medicare Other | Attending: Physician Assistant

## 2017-02-15 DIAGNOSIS — I890 Lymphedema, not elsewhere classified: Secondary | ICD-10-CM

## 2017-02-15 DIAGNOSIS — M25512 Pain in left shoulder: Secondary | ICD-10-CM | POA: Insufficient documentation

## 2017-02-15 DIAGNOSIS — M79602 Pain in left arm: Secondary | ICD-10-CM | POA: Insufficient documentation

## 2017-02-15 DIAGNOSIS — M25612 Stiffness of left shoulder, not elsewhere classified: Secondary | ICD-10-CM | POA: Insufficient documentation

## 2017-02-15 NOTE — Therapy (Signed)
[place], [place], [address] Phone: [phone]   Fax:  [phone]  Physical Therapy Treatment  Patient Details  Name: Jordan [name] MRN: [id number] Date of Birth: October 07, 1950 Referring Provider: Harrison Mons, PA-C  Encounter Date: 02/15/2017      PT End of Session - 02/15/17 1057    Visit Number 11   Number of Visits 17   Date for PT Re-Evaluation 02/22/17   PT Start Time 2355   PT Stop Time 1046   PT Time Calculation (min) 23 min   Activity Tolerance Patient tolerated treatment well   Behavior During Therapy [facility] for tasks assessed/performed      Past Medical History:  Diagnosis Date  . Anemia   . Arthritis    oa left shoulder  . Back pain    buldging disc  . Blood clot of neck vein   . Breast cancer ([place]) 10/17/2014   lower inner quadrant of the right breast  . Cancer ([place])    colon  . Constipation   . Depression    takes Wellbutrin daily  . Dizziness    r/t side effects from meds  . Family history of adverse reaction to anesthesia    It is hard for my mother to wake up from anesthesia.  . Generalized headaches    due to allergies, sinus  . GERD (gastroesophageal reflux disease)    takes Protonix as needed  . Hemorrhoids   . History of bronchitis    last time about 6-64yr ago  . History of colon polyps   . History of hiatal hernia   . History of migraine    last one about 15+yrs ago  . History of UTI   . Hx of seasonal allergies    takes OTC allergy med nightly  . Hyperlipidemia    takes Zetia and Zocor daily  . Hypertension    takes Maxzide and Metoprolol daily  . Hypothyroidism    takes Synthroid daily  . Joint pain   . Leg swelling   . Mass of colon   . Nasal congestion   . Nausea   . Pneumonia    walking about 6-780yrago  . PONV (postoperative nausea and vomiting)    pt states she is very easy to sedate  . S/P radiation therapy 03/11/2015 through  04/25/2015    Right breast 4500 cGy in 25 sessions, right breast boost 1600 cGy in 8 sessions   . Thyroid disease   . Tuberculosis    as little girl    . Vertigo    takes Meclizine prn  . Wears glasses   . Wears glasses     Past Surgical History:  Procedure Laterality Date  . ABDOMINAL HYSTERECTOMY    . APPENDECTOMY    . CARPAL TUNNEL RELEASE    . [place]. COLONOSCOPY    . ESOPHAGOGASTRODUODENOSCOPY    . EXPLORATORY LAPAROTOMY    . KNEE SURGERY  1998   right - arthroscopic  . PARTIAL COLECTOMY  03/30/2012   Procedure: PARTIAL COLECTOMY;  Surgeon: JaGwenyth OberMD;  [facility] Service: General;  Laterality: N/A;  . PORT-A-CATH REMOVAL N/A 06/25/2015   Procedure: REMOVAL PORT-A-CATH;  Surgeon: PaAutumn MessingII, MD;  [facility] Service: General;  Laterality: N/A;  . PORTACATH PLACEMENT    . RADIOACTIVE SEED GUIDED MASTECTOMY WITH AXILLARY SENTINEL LYMPH NODE BIOPSY Right 10/17/2014   Procedure:  RADIOACTIVE SEED GUIDED PARTIAL MASTECTOMY WITH AXILLARY SENTINEL LYMPH NODE BIOPSY;  Surgeon: [name], MD;  Location: [place];  Service: General;  Laterality: Right;  . TOTAL SHOULDER ARTHROPLASTY Left 03/04/2016   Procedure: LEFT TOTAL SHOULDER ARTHROPLASTY;  Surgeon: [name], MD;  Location: [place];  Service: Orthopedics;  Laterality: Left;    There were no vitals filed for this visit.      Subjective Assessment - 02/15/17 1052    Subjective I'm having a bad day, personal issues at home. I can't stay long today.    Pertinent History Right breast cancer diagnosed January 2016 with lumpectomy, then chemo, then radiation (finished all 04/23/15 approx.).  Left total shoulder replacement 03/04/16 approx.  HTN controlled with meds.  Cleaning and helping her mother transfer caused left arm aggravation, with whole arm swelling.   Patient Stated Goals get rid of the pain  and swelling   Currently in Pain? Other (Comment)  Pt didn't comment on pain today due to being upset from issue at home.                          [facility] Adult PT Treatment/Exercise - 02/15/17 0001      Manual Therapy   Edema Management Issued pts compression sleeve and glove and donned it for her today and instructed pt how to donn/doff. Also would be good for her to get rubber gloves to assist with donning at home as her shoulder was irritated from donning today with therapist assisting. Overall sleeve was a very good fit and pt reported sleeve and glove comfortable except fingers were too long on glove so will contact fitter to have them remake glove.                         Long Term Clinic Goals - 02/01/17 1201      CC Long Term Goal  #3   Title Patient will have been fitted for a compression sleeve and glove for left UE   Status Achieved     CC Long Term Goal  #5   Title lymphedema life impact scale score reduced to 35% impairment or less   Baseline score of 45 with 16 questions answered = 70% impairment at eval; scored 25 today=37% impaired-02/01/17   Status Partially Met            Plan - 02/15/17 1058    Clinical Impression Statement Pt came in near tearful and then became tearful once in treatment room explaining she was having personal issues at home and wsan't going to be able to stay for long today. Her compression sleeve and glove had come in so was able to issue these to pt however the glove fingers are too long so therapist will be contacting fitter with measurements retaken today and have them remake glove.    Rehab Potential Good   PT Frequency 2x / week   PT Duration 4 weeks   PT Treatment/Interventions ADLs/Self Care Home Management;Therapeutic exercise;Patient/family education;Orthotic Fit/Training;Manual techniques;Manual lymph drainage;Compression bandaging;Passive range of motion;Taping   PT Next Visit Plan Cont manual lymph  drainage to left UE (only using Lt axillo-inguinal anastomosis due to Rt SLNB);  Include left shoulder ROM and strengthening as time permits, reassess garments making sure they are still comfortable (aside from the fingers which are too long).   Recommended Other Services Emailed fitter today to let him know fingers were  too long and we were going to need a remake of glove.    Consulted and Agree with Plan of Care Patient      Patient will benefit from skilled therapeutic intervention in order to improve the following deficits and impairments:  Increased edema, Pain, Decreased strength, Decreased range of motion  Visit Diagnosis: Lymphedema, not elsewhere classified  Pain in left arm     Problem List Patient Active Problem List   Diagnosis Date Noted  . Lymphedema of right upper extremity 12/07/2016  . BMI 40.0-44.9, adult ([place]) 06/22/2016  . S/P shoulder replacement 03/04/2016  . Constipation 12/16/2014  . Chemotherapy-induced nausea 12/16/2014  . Hyperglycemia 12/09/2014  . Internal jugular vein thrombosis ([place])   . Leukocytosis   . DVT (deep venous thrombosis) ([place]) 12/02/2014  . Osteoarthritis of both feet 11/26/2014  . Poor venous access   . Colorectal cancer ([place]) 10/03/2014  . Malignant neoplasm of lower-inner quadrant of right breast of female, estrogen receptor negative ([place]) 09/27/2014  . Anemia, iron deficiency 04/25/2013  . GERD (gastroesophageal reflux disease) 02/12/2013  . HTN (hypertension) 10/21/2011  . Hypothyroidism 10/21/2011  . Depression 10/21/2011  . Hyperlipidemia 10/21/2011  . Lung nodules 10/21/2011  . Vertigo 10/21/2011  . Vitamin D deficiency 10/21/2011    Otelia Limes, PTA 02/15/2017, 11:01 AM  [place], [place], [address] Phone: [phone]   Fax:  [phone]  Name: EMAN RYNDERS MRN: [id number] Date of Birth: 1951/05/01

## 2017-02-17 ENCOUNTER — Ambulatory Visit: Payer: Medicare Other | Admitting: Physical Therapy

## 2017-02-17 ENCOUNTER — Encounter: Payer: Self-pay | Admitting: Physical Therapy

## 2017-02-17 DIAGNOSIS — M25512 Pain in left shoulder: Secondary | ICD-10-CM | POA: Diagnosis not present

## 2017-02-17 DIAGNOSIS — I890 Lymphedema, not elsewhere classified: Secondary | ICD-10-CM | POA: Diagnosis not present

## 2017-02-17 DIAGNOSIS — M25612 Stiffness of left shoulder, not elsewhere classified: Secondary | ICD-10-CM | POA: Diagnosis not present

## 2017-02-17 DIAGNOSIS — M79602 Pain in left arm: Secondary | ICD-10-CM | POA: Diagnosis not present

## 2017-02-17 NOTE — Therapy (Signed)
[place], [place], [address] Phone: [phone]   Fax:  [phone]  Physical Therapy Treatment  Patient Details  Name: Jordan [name] MRN: [id number] Date of Birth: 08-Apr-1951 Referring Provider: Harrison Mons, PA-C  Encounter Date: 02/17/2017      PT End of Session - 02/17/17 1208    Visit Number 12   Number of Visits 17   Date for PT Re-Evaluation 02/22/17   PT Start Time 1017   PT Stop Time 1102   PT Time Calculation (min) 45 min   Activity Tolerance Patient tolerated treatment well   Behavior During Therapy [facility] for tasks assessed/performed      Past Medical History:  Diagnosis Date  . Anemia   . Arthritis    oa left shoulder  . Back pain    buldging disc  . Blood clot of neck vein   . Breast cancer ([place]) 10/17/2014   lower inner quadrant of the right breast  . Cancer ([place])    colon  . Constipation   . Depression    takes Wellbutrin daily  . Dizziness    r/t side effects from meds  . Family history of adverse reaction to anesthesia    It is hard for my mother to wake up from anesthesia.  . Generalized headaches    due to allergies, sinus  . GERD (gastroesophageal reflux disease)    takes Protonix as needed  . Hemorrhoids   . History of bronchitis    last time about 6-46yr ago  . History of colon polyps   . History of hiatal hernia   . History of migraine    last one about 15+yrs ago  . History of UTI   . Hx of seasonal allergies    takes OTC allergy med nightly  . Hyperlipidemia    takes Zetia and Zocor daily  . Hypertension    takes Maxzide and Metoprolol daily  . Hypothyroidism    takes Synthroid daily  . Joint pain   . Leg swelling   . Mass of colon   . Nasal congestion   . Nausea   . Pneumonia    walking about 6-715yrago  . PONV (postoperative nausea and vomiting)    pt states she is very easy to sedate  . S/P radiation therapy 03/11/2015 through  04/25/2015    Right breast 4500 cGy in 25 sessions, right breast boost 1600 cGy in 8 sessions   . Thyroid disease   . Tuberculosis    as little girl    . Vertigo    takes Meclizine prn  . Wears glasses   . Wears glasses     Past Surgical History:  Procedure Laterality Date  . ABDOMINAL HYSTERECTOMY    . APPENDECTOMY    . CARPAL TUNNEL RELEASE    . [place]    . ESOPHAGOGASTRODUODENOSCOPY    . EXPLORATORY LAPAROTOMY    . KNEE SURGERY  1998   right - arthroscopic  . PARTIAL COLECTOMY  03/30/2012   Procedure: PARTIAL COLECTOMY;  Surgeon: JaGwenyth OberMD;  [facility] Service: General;  Laterality: N/A;  . PORT-A-CATH REMOVAL N/A 06/25/2015   Procedure: REMOVAL PORT-A-CATH;  Surgeon: PaAutumn MessingII, MD;  [facility] Service: General;  Laterality: N/A;  . PORTACATH PLACEMENT    . RADIOACTIVE SEED GUIDED MASTECTOMY WITH AXILLARY SENTINEL LYMPH NODE BIOPSY Right 10/17/2014   Procedure:  RADIOACTIVE SEED GUIDED PARTIAL MASTECTOMY WITH AXILLARY SENTINEL LYMPH NODE BIOPSY;  Surgeon: [name], MD;  Location: [place];  Service: General;  Laterality: Right;  . TOTAL SHOULDER ARTHROPLASTY Left 03/04/2016   Procedure: LEFT TOTAL SHOULDER ARTHROPLASTY;  Surgeon: [name], MD;  Location: [place];  Service: Orthopedics;  Laterality: Left;    There were no vitals filed for this visit.      Subjective Assessment - 02/17/17 1020    Subjective The sleeve and the glove are wonderful. I will just have to get used to the area where they overlap. The fingers are still too long.    Pertinent History Right breast cancer diagnosed January 2016 with lumpectomy, then chemo, then radiation (finished all 04/23/15 approx.).  Left total shoulder replacement 03/04/16 approx.  HTN controlled with meds.  Cleaning and helping her mother transfer caused left arm aggravation, with  whole arm swelling.   Patient Stated Goals get rid of the pain and swelling   Currently in Pain? No/denies   Pain Score 0-No pain                         [facility] Adult PT Treatment/Exercise - 02/17/17 0001      Manual Therapy   Manual Therapy Manual Lymphatic Drainage (MLD)   Manual Lymphatic Drainage (MLD) Short neck, five diaphragmatic breaths, left inguinal nodes and axillo-inguinal anastomosis, and left UE from fingers to shoulder working from proximal to distal then retracing all steps.                         Long Term Clinic Goals - 02/01/17 1201      CC Long Term Goal  #3   Title Patient will have been fitted for a compression sleeve and glove for left UE   Status Achieved     CC Long Term Goal  #5   Title lymphedema life impact scale score reduced to 35% impairment or less   Baseline score of 45 with 16 questions answered = 70% impairment at eval; scored 25 today=37% impaired-02/01/17   Status Partially Met            Plan - 02/17/17 1208    Clinical Impression Statement Patient states her really likes her new sleeve and glove and has been wearing them. She states the fingers are still too long. Continued MLD to LUE today. Awaiting glove to be remade with appropriate finger length.    Rehab Potential Good   PT Frequency 2x / week   PT Duration 4 weeks   PT Treatment/Interventions ADLs/Self Care Home Management;Therapeutic exercise;Patient/family education;Orthotic Fit/Training;Manual techniques;Manual lymph drainage;Compression bandaging;Passive range of motion;Taping   PT Next Visit Plan Cont manual lymph drainage to left UE (only using Lt axillo-inguinal anastomosis due to Rt SLNB);  Include left shoulder ROM and strengthening as time permits, reassess garments making sure they are still comfortable (aside from the fingers which are too long).   Consulted and Agree with Plan of Care Patient      Patient will benefit from skilled  therapeutic intervention in order to improve the following deficits and impairments:  Increased edema, Pain, Decreased strength, Decreased range of motion  Visit Diagnosis: Lymphedema, not elsewhere classified     Problem List Patient Active Problem List   Diagnosis Date Noted  . Lymphedema of right upper extremity 12/07/2016  . BMI 40.0-44.9, adult ([place]) 06/22/2016  . S/P shoulder replacement 03/04/2016  .  Constipation 12/16/2014  . Chemotherapy-induced nausea 12/16/2014  . Hyperglycemia 12/09/2014  . Internal jugular vein thrombosis ([place])   . Leukocytosis   . DVT (deep venous thrombosis) ([place]) 12/02/2014  . Osteoarthritis of both feet 11/26/2014  . Poor venous access   . Colorectal cancer ([place]) 10/03/2014  . Malignant neoplasm of lower-inner quadrant of right breast of female, estrogen receptor negative ([place]) 09/27/2014  . Anemia, iron deficiency 04/25/2013  . GERD (gastroesophageal reflux disease) 02/12/2013  . HTN (hypertension) 10/21/2011  . Hypothyroidism 10/21/2011  . Depression 10/21/2011  . Hyperlipidemia 10/21/2011  . Lung nodules 10/21/2011  . Vertigo 10/21/2011  . Vitamin D deficiency 10/21/2011    Allyson Sabal [facility] 02/17/2017, 12:10 PM  [place], [place], [address] Phone: [phone]   Fax:  [phone]  Name: Jordan [name] MRN: [id number] Date of Birth: 06/08/1951  Manus Gunning, PT 02/17/17 12:10 PM

## 2017-03-08 ENCOUNTER — Ambulatory Visit: Payer: Medicare Other

## 2017-03-08 DIAGNOSIS — M25512 Pain in left shoulder: Secondary | ICD-10-CM | POA: Diagnosis not present

## 2017-03-08 DIAGNOSIS — M79602 Pain in left arm: Secondary | ICD-10-CM

## 2017-03-08 DIAGNOSIS — I890 Lymphedema, not elsewhere classified: Secondary | ICD-10-CM | POA: Diagnosis not present

## 2017-03-08 DIAGNOSIS — M25612 Stiffness of left shoulder, not elsewhere classified: Secondary | ICD-10-CM

## 2017-03-08 NOTE — Therapy (Signed)
[place], [place], [address] Phone: [phone]   Fax:  [phone]  Physical Therapy Treatment  Patient Details  Name: Jordan [name] MRN: [id number] Date of Birth: Feb 09, 1951 Referring Provider: Harrison Mons, PA-C  Encounter Date: 03/08/2017      PT End of Session - 03/08/17 1153    Visit Number 13   Number of Visits 17   Date for PT Re-Evaluation 02/22/17   PT Start Time 1106   PT Stop Time 1154   PT Time Calculation (min) 48 min   Activity Tolerance Patient tolerated treatment well   Behavior During Therapy [facility] for tasks assessed/performed      Past Medical History:  Diagnosis Date  . Anemia   . Arthritis    oa left shoulder  . Back pain    buldging disc  . Blood clot of neck vein   . Breast cancer ([place]) 10/17/2014   lower inner quadrant of the right breast  . Cancer ([place])    colon  . Constipation   . Depression    takes Wellbutrin daily  . Dizziness    r/t side effects from meds  . Family history of adverse reaction to anesthesia    It is hard for my mother to wake up from anesthesia.  . Generalized headaches    due to allergies, sinus  . GERD (gastroesophageal reflux disease)    takes Protonix as needed  . Hemorrhoids   . History of bronchitis    last time about 6-50yr ago  . History of colon polyps   . History of hiatal hernia   . History of migraine    last one about 15+yrs ago  . History of UTI   . Hx of seasonal allergies    takes OTC allergy med nightly  . Hyperlipidemia    takes Zetia and Zocor daily  . Hypertension    takes Maxzide and Metoprolol daily  . Hypothyroidism    takes Synthroid daily  . Joint pain   . Leg swelling   . Mass of colon   . Nasal congestion   . Nausea   . Pneumonia    walking about 6-710yrago  . PONV (postoperative nausea and vomiting)    pt states she is very easy to sedate  . S/P radiation therapy 03/11/2015 through  04/25/2015    Right breast 4500 cGy in 25 sessions, right breast boost 1600 cGy in 8 sessions   . Thyroid disease   . Tuberculosis    as little girl    . Vertigo    takes Meclizine prn  . Wears glasses   . Wears glasses     Past Surgical History:  Procedure Laterality Date  . ABDOMINAL HYSTERECTOMY    . APPENDECTOMY    . CARPAL TUNNEL RELEASE    . [place]. COL[place]    . ESOPHAGOGASTRODUODENOSCOPY    . EXPLORATORY LAPAROTOMY    . KNEE SURGERY  1998   right - arthroscopic  . PARTIAL COLECTOMY  03/30/2012   Procedure: PARTIAL COLECTOMY;  Surgeon: JaGwenyth OberMD;  [facility] Service: General;  Laterality: N/A;  . PORT-A-CATH REMOVAL N/A 06/25/2015   Procedure: REMOVAL PORT-A-CATH;  Surgeon: PaAutumn MessingII, MD;  [facility] Service: General;  Laterality: N/A;  . PORTACATH PLACEMENT    . RADIOACTIVE SEED GUIDED MASTECTOMY WITH AXILLARY SENTINEL LYMPH NODE BIOPSY Right 10/17/2014   Procedure:  RADIOACTIVE SEED GUIDED PARTIAL MASTECTOMY WITH AXILLARY SENTINEL LYMPH NODE BIOPSY;  Surgeon: [name], MD;  Location: [place];  Service: General;  Laterality: Right;  . TOTAL SHOULDER ARTHROPLASTY Left 03/04/2016   Procedure: LEFT TOTAL SHOULDER ARTHROPLASTY;  Surgeon: [name], MD;  Location: [place];  Service: Orthopedics;  Laterality: Left;    There were no vitals filed for this visit.      Subjective Assessment - 03/08/17 1110    Subjective The sleeve still feels good and I'm wearing the glove but the fingers are definitely too long. And I forgot to come to get it remeasured last week, I'm sorry!My Lt shoulder is atually feeling okay, just really tight.   Pertinent History Right breast cancer diagnosed January 2016 with lumpectomy, then chemo, then radiation (finished all 04/23/15 approx.).  Left total shoulder replacement 03/04/16 approx.  HTN controlled with  meds.  Cleaning and helping her mother transfer caused left arm aggravation, with whole arm swelling.   Patient Stated Goals get rid of the pain and swelling   Currently in Pain? No/denies            [facility] PT Assessment - 03/08/17 0001      AROM   Left Shoulder Flexion 166 Degrees   Left Shoulder ABduction 143 Degrees                     [facility] Adult PT Treatment/Exercise - 03/08/17 0001      Manual Therapy   Manual Therapy Passive ROM   Passive ROM Lt shoulder into flexion, abduction, and er to pts tolerance.                        Long Term Clinic Goals - 03/08/17 1123      CC Long Term Goal  #1   Title Left arm circumference at hand will reduce by 0.7 cm. to 21 cm.   Baseline 21.7 at eval; 20.2 cm-01/25/17; 20.8 cm-03/08/17   Status Achieved     CC Long Term Goal  #2   Title Pt. will be independent in self-care for lymphedema.   Baseline Pt has received compression garements and wearing them although glove is too long at fingers, she has been remeasured for this-03/08/17   Status Achieved     CC Long Term Goal  #3   Title Patient will have been fitted for a compression sleeve and glove for left UE   Baseline Pt gets measured tomorrow-01/25/17; pt has received this and is wearing them (though has been remeasured for glove that is too long)-03/08/17   Status Achieved     CC Long Term Goal  #4   Title Pt. will report at least 50% decrease in left UE pain at the end of the day compared to initial eval today   Baseline 95% improvement with end of the day pain-03/08/17   Status Achieved     CC Long Term Goal  #5   Title lymphedema life impact scale score reduced to 35% impairment or less   Baseline score of 45 with 16 questions answered = 70% impairment at eval; scored 25 today=37% impaired-02/01/17; 21% - 03/08/17   Status Achieved            Plan - 03/08/17 1206    Clinical Impression Statement Pt has been wearing her sleeve and glove,  although the fingers are too long (especially 3rd and 4th fingers) so remeasured her today  and will send that to [facility]. Pt has decided that she wants to be D/C at this time as she is doing wel but also for personal reasons as her brother who has Stage IV cancer isn't doing well. Pt has met all goals though and will be D/C at this time.     Rehab Potential Good   PT Frequency 2x / week   PT Duration 4 weeks   PT Treatment/Interventions ADLs/Self Care Home Management;Therapeutic exercise;Patient/family education;Orthotic Fit/Training;Manual techniques;Manual lymph drainage;Compression bandaging;Passive range of motion;Taping   PT Next Visit Plan D/C this visit.    Recommended Other Services Emailed fitter today about new glove measurements.   Consulted and Agree with Plan of Care Patient      Patient will benefit from skilled therapeutic intervention in order to improve the following deficits and impairments:  Increased edema, Pain, Decreased strength, Decreased range of motion  Visit Diagnosis: Lymphedema, not elsewhere classified  Pain in left arm  Stiffness of left shoulder, not elsewhere classified  Left shoulder pain, unspecified chronicity       G-Codes - 03/13/2017 1233    Functional Assessment Tool Used (Outpatient Only) lymphedema life impact scale   Functional Limitation Self care   Self Care Goal Status (E7209) At least 20 percent but less than 40 percent impaired, limited or restricted   Self Care Discharge Status [phone]) At least 20 percent but less than 40 percent impaired, limited or restricted  21%      Problem List Patient Active Problem List   Diagnosis Date Noted  . Lymphedema of right upper extremity 12/07/2016  . BMI 40.0-44.9, adult ([place]) 06/22/2016  . S/P shoulder replacement 03/04/2016  . Constipation 12/16/2014  . Chemotherapy-induced nausea 12/16/2014  . Hyperglycemia 12/09/2014  . Internal jugular vein thrombosis ([place])   . Leukocytosis   . DVT (deep  venous thrombosis) ([place]) 12/02/2014  . Osteoarthritis of both feet 11/26/2014  . Poor venous access   . Colorectal cancer ([place]) 10/03/2014  . Malignant neoplasm of lower-inner quadrant of right breast of female, estrogen receptor negative ([place]) 09/27/2014  . Anemia, iron deficiency 04/25/2013  . GERD (gastroesophageal reflux disease) 02/12/2013  . HTN (hypertension) 10/21/2011  . Hypothyroidism 10/21/2011  . Depression 10/21/2011  . Hyperlipidemia 10/21/2011  . Lung nodules 10/21/2011  . Vertigo 10/21/2011  . Vitamin D deficiency 10/21/2011    SALISBURY,DONNA, PTA 03-13-17, 12:34 PM  [place], [place], [address] Phone: [phone]   Fax:  [phone]  Name: Jordan [name] MRN: [id number] Date of Birth: 07-16-1951  PHYSICAL THERAPY DISCHARGE SUMMARY  Visits from Start of Care: 13  Current functional level related to goals / functional outcomes: Goals met as noted above.   Remaining deficits: Waiting for remake of her compression glove, as first had fingers that were too long.   Education / Equipment: Compression sleeve and glove, home exercise program. Plan: Patient agrees to discharge.  Patient goals were met. Patient is being discharged due to meeting the stated rehab goals.  ?????She also wants to be discharged for personal reasons, as she has a brother who is terminally ill.   Serafina Royals, PT 13-Mar-2017 12:36 PM

## 2017-03-10 DIAGNOSIS — C50311 Malignant neoplasm of lower-inner quadrant of right female breast: Secondary | ICD-10-CM | POA: Diagnosis not present

## 2017-03-12 ENCOUNTER — Other Ambulatory Visit: Payer: Self-pay | Admitting: Physician Assistant

## 2017-03-12 DIAGNOSIS — M19072 Primary osteoarthritis, left ankle and foot: Secondary | ICD-10-CM

## 2017-03-12 DIAGNOSIS — F32A Depression, unspecified: Secondary | ICD-10-CM

## 2017-03-12 DIAGNOSIS — F329 Major depressive disorder, single episode, unspecified: Secondary | ICD-10-CM

## 2017-03-12 DIAGNOSIS — M19071 Primary osteoarthritis, right ankle and foot: Secondary | ICD-10-CM

## 2017-04-12 ENCOUNTER — Ambulatory Visit: Payer: Medicare Other | Admitting: Physician Assistant

## 2017-04-27 ENCOUNTER — Ambulatory Visit (INDEPENDENT_AMBULATORY_CARE_PROVIDER_SITE_OTHER): Payer: Medicare Other | Admitting: Physician Assistant

## 2017-04-27 ENCOUNTER — Encounter: Payer: Self-pay | Admitting: Physician Assistant

## 2017-04-27 VITALS — BP 119/80 | HR 73 | Temp 98.0°F | Resp 18 | Ht 61.5 in | Wt 222.8 lb

## 2017-04-27 DIAGNOSIS — E039 Hypothyroidism, unspecified: Secondary | ICD-10-CM | POA: Diagnosis not present

## 2017-04-27 DIAGNOSIS — Z23 Encounter for immunization: Secondary | ICD-10-CM

## 2017-04-27 DIAGNOSIS — Z6841 Body Mass Index (BMI) 40.0 and over, adult: Secondary | ICD-10-CM | POA: Diagnosis not present

## 2017-04-27 DIAGNOSIS — I10 Essential (primary) hypertension: Secondary | ICD-10-CM | POA: Diagnosis not present

## 2017-04-27 DIAGNOSIS — E785 Hyperlipidemia, unspecified: Secondary | ICD-10-CM

## 2017-04-27 DIAGNOSIS — D509 Iron deficiency anemia, unspecified: Secondary | ICD-10-CM

## 2017-04-27 DIAGNOSIS — R739 Hyperglycemia, unspecified: Secondary | ICD-10-CM | POA: Diagnosis not present

## 2017-04-27 DIAGNOSIS — E559 Vitamin D deficiency, unspecified: Secondary | ICD-10-CM | POA: Diagnosis not present

## 2017-04-27 MED ORDER — SIMVASTATIN 40 MG PO TABS: 40.0000 mg | ORAL_TABLET | Freq: Every day | ORAL | 5 refills | Status: DC

## 2017-04-27 NOTE — Patient Instructions (Signed)
     IF you received an x-ray today, you will receive an invoice from [facility]. Please contact [facility] at [phone] with questions or concerns regarding your invoice.   IF you received labwork today, you will receive an invoice from [facility]. Please contact [facility] at [phone] with questions or concerns regarding your invoice.   Our billing staff will not be able to assist you with questions regarding bills from these companies.  You will be contacted with the lab results as soon as they are available. The fastest way to get your results is to activate your My Chart account. Instructions are located on the last page of this paperwork. If you have not heard from us regarding the results in 2 weeks, please contact this office.     

## 2017-04-27 NOTE — Assessment & Plan Note (Signed)
Await labs. Adjust regimen as indicated by results.  

## 2017-04-27 NOTE — Assessment & Plan Note (Signed)
Well controlled. Continue current treatment. 

## 2017-04-27 NOTE — Progress Notes (Signed)
Patient ID: Jordan [name], female    DOB: 1951-01-02, 66 y.o.   MRN: [id number]  PCP: Harrison Mons, PA-C  Chief Complaint  Patient presents with  . Recheck Fasting Labs    Subjective:   Presents for evaluation of HTN, hyperlipidemia, hyperglycemia, hypothyroidism and low vitamin D.  Her own health has been stable, but she has had a difficult several months due to deaths in her family. Her brother, Josph Macho, died recently of colon cancer. His death came only 2 months after their mother's death. Their father died 3 months prior to that. Their parents' deaths caused a lot of family friction, as different siblings and nieces/nephews argued about support, fault and the estate. Josph Macho had been working on their family tree, and she and her other siblings are working on a book about their family, along with their parents' love story. They met in elementary school. She was getting on the school bus and he saw her for the first time, and he said, "I'm going to marry that girl." They were together for 67 years.  Continues to struggle with her parents' deaths, especially worried that "I wasn't good enough," for her mother. None-the-less, her siblings and extended family are all working hard at [employer] their relationships.  Has started having vertigo again, after no episodes in a few years. Uses extra caution when driving to not look out the side of the car.  Joint pain is some better with meloxicam, but not resolved. Intermittent LEFT leg swelling.   Review of Systems No chest pain, SOB, HA, dizziness, vision change, N/V, diarrhea, constipation, dysuria, urinary urgency or frequency, new/unexplained myalgias, arthralgias or rash.    Patient Active Problem List   Diagnosis Date Noted  . Lymphedema of right upper extremity 12/07/2016  . BMI 40.0-44.9, adult ([place]) 06/22/2016  . S/P shoulder replacement 03/04/2016  . Constipation 12/16/2014  . Chemotherapy-induced nausea 12/16/2014  .  Hyperglycemia 12/09/2014  . Internal jugular vein thrombosis ([place])   . Leukocytosis   . DVT (deep venous thrombosis) ([place]) 12/02/2014  . Osteoarthritis of both feet 11/26/2014  . Poor venous access   . Colorectal cancer ([place]) 10/03/2014  . Malignant neoplasm of lower-inner quadrant of right breast of female, estrogen receptor negative ([place]) 09/27/2014  . Anemia, iron deficiency 04/25/2013  . GERD (gastroesophageal reflux disease) 02/12/2013  . HTN (hypertension) 10/21/2011  . Hypothyroidism 10/21/2011  . Depression 10/21/2011  . Hyperlipidemia 10/21/2011  . Lung nodules 10/21/2011  . Vertigo 10/21/2011  . Vitamin D deficiency 10/21/2011     Prior to Admission medications   Medication Sig Start Date End Date Taking? Authorizing Provider  aspirin 81 MG tablet Take 81 mg by mouth daily.   Yes [provider]  buPROPion (WELLBUTRIN XL) 150 MG 24 hr tablet TAKE 1 TABLET BY MOUTH EVERY DAY 03/12/17  Yes Geoffery Aultman, PA-C  diphenhydrAMINE (BENADRYL) 25 MG tablet Take 25 mg by mouth daily as needed for allergies.   Yes [provider]  levothyroxine (SYNTHROID, LEVOTHROID) 112 MCG tablet Take 1 tablet (112 mcg total) by mouth daily. 01/06/17  Yes Kiele Heavrin, PA-C  levothyroxine (SYNTHROID, LEVOTHROID) 125 MCG tablet TAKE 1 TABLET BY MOUTH EVERY DAY 07/18/16  Yes Isabell Bonafede, PA-C  meloxicam (MOBIC) 15 MG tablet TAKE 1 TABLET BY MOUTH EVERY DAY 03/12/17  Yes Drucella Karbowski, PA-C  mupirocin ointment (BACTROBAN) 2 % Apply 1 application topically 3 (three) times daily. 02/03/17  Yes Eugen Jeansonne, PA-C  nitroGLYCERIN (NITROSTAT) 0.4 MG  SL tablet Place 1 tablet (0.4 mg total) under the tongue every 5 (five) minutes as needed for chest pain. 11/23/12  Yes Zeniah Briney, PA-C  ondansetron (ZOFRAN) 4 MG tablet Take 1 tablet (4 mg total) by mouth every 8 (eight) hours as needed for nausea or vomiting. 03/05/16  Yes Shuford, Olivia Mackie, PA-C  oxyCODONE-acetaminophen (PERCOCET)  5-325 MG tablet Take 1-2 tablets by mouth every 4 (four) hours as needed. 03/05/16  Yes Shuford, Olivia Mackie, PA-C  Prenatal Multivit-Min-Fe-FA (PRENATAL VITAMINS PO) Take 1 tablet by mouth daily.    Yes [provider]  simvastatin (ZOCOR) 40 MG tablet TAKE 1 TABLET BY MOUTH AT BEDTIME. 02/14/17  Yes Rodolphe Edmonston, PA-C  triamterene-hydrochlorothiazide (MAXZIDE-25) 37.5-25 MG tablet Take 1 tablet by mouth daily. 12/07/16  Yes Taiz Bickle, PA-C     No Known Allergies     Objective:  Physical Exam  Constitutional: She is oriented to person, place, and time. She appears well-developed and well-nourished. She is active and cooperative. No distress.  BP 119/80 (BP Location: Right Arm, Patient Position: Sitting, Cuff Size: Large)   Pulse 73   Temp 98 F (36.7 C) (Oral)   Resp 18   Ht 5' 1.5" (1.562 m)   Wt 222 lb 12.8 oz (101.1 kg)   SpO2 98%   BMI 41.42 kg/m   HENT:  Head: Normocephalic and atraumatic.  Right Ear: Hearing normal.  Left Ear: Hearing normal.  Eyes: Conjunctivae are normal. No scleral icterus.  Neck: Normal range of motion. Neck supple. No thyromegaly present.  Cardiovascular: Normal rate, regular rhythm and normal heart sounds.   Pulses:      Radial pulses are 2+ on the right side, and 2+ on the left side.  Pulmonary/Chest: Effort normal and breath sounds normal.  Lymphadenopathy:       Head (right side): No tonsillar, no preauricular, no posterior auricular and no occipital adenopathy present.       Head (left side): No tonsillar, no preauricular, no posterior auricular and no occipital adenopathy present.    She has no cervical adenopathy.       Right: No supraclavicular adenopathy present.       Left: No supraclavicular adenopathy present.  Neurological: She is alert and oriented to person, place, and time. No sensory deficit.  Skin: Skin is warm, dry and intact. No rash noted. No cyanosis or erythema. Nails show no clubbing.  Psychiatric: She has a normal  mood and affect. Her speech is normal and behavior is normal.           Assessment & Plan:   Problem List Items Addressed This Visit    HTN (hypertension) - Primary (Chronic)    Well controlled. Continue current treatment.      Relevant Medications   simvastatin (ZOCOR) 40 MG tablet   Other Relevant Orders   Comprehensive metabolic panel (Completed)   CBC with Differential/Platelet (Completed)   Hypothyroidism (Chronic)    Await labs. Adjust regimen as indicated by results.       Relevant Orders   TSH (Completed)   T4, free (Completed)   Hyperlipidemia (Chronic)   Relevant Medications   simvastatin (ZOCOR) 40 MG tablet   Other Relevant Orders   Lipid panel (Completed)   Vitamin D deficiency (Chronic)    Update labs today. This will be the 3rd test in the previous 12 months, the 2nd in 2018.      Relevant Orders   VITAMIN D 25 Hydroxy (Vit-D Deficiency, Fractures) (Completed)  Anemia, iron deficiency    Await labs. Adjust regimen as indicated by results.       Relevant Orders   CBC with Differential/Platelet (Completed)   Hyperglycemia   Relevant Orders   Comprehensive metabolic panel (Completed)   Hemoglobin A1c (Completed)   BMI 40.0-44.9, adult ([name])    Other Visit Diagnoses    Need for influenza vaccination       Relevant Orders   Flu Vaccine QUAD 36+ mos IM (Completed)   Care order/instruction:       Return in about 6 months (around 10/25/2017) for re-evalaution of blood pressure, cholesterol, etc.   Fara Chute, PA-C [facility]

## 2017-04-27 NOTE — Assessment & Plan Note (Signed)
Update labs today. This will be the 3rd test in the previous 12 months, the 2nd in 2018.

## 2017-04-28 LAB — COMPREHENSIVE METABOLIC PANEL
ALBUMIN: 4.2 g/dL (ref 3.6–4.8)
ALK PHOS: 74 IU/L (ref 39–117)
ALT: 17 IU/L (ref 0–32)
AST: 24 IU/L (ref 0–40)
Albumin/Globulin Ratio: 1.6 (ref 1.2–2.2)
BILIRUBIN TOTAL: 0.6 mg/dL (ref 0.0–1.2)
BUN / CREAT RATIO: 17 (ref 12–28)
BUN: 15 mg/dL (ref 8–27)
CHLORIDE: 108 mmol/L — AB (ref 96–106)
CO2: 22 mmol/L (ref 20–29)
Calcium: 9.3 mg/dL (ref 8.7–10.3)
Creatinine, Ser: 0.88 mg/dL (ref 0.57–1.00)
GFR calc Af Amer: 78 mL/min/{1.73_m2} (ref >59)
GFR calc non Af Amer: 68 mL/min/{1.73_m2} (ref >59)
Globulin, Total: 2.7 g/dL (ref 1.5–4.5)
Glucose: 90 mg/dL (ref 65–99)
Potassium: 3.6 mmol/L (ref 3.5–5.2)
SODIUM: 148 mmol/L — AB (ref 134–144)
Total Protein: 6.9 g/dL (ref 6.0–8.5)

## 2017-04-28 LAB — CBC WITH DIFFERENTIAL/PLATELET
BASOS ABS: 0.1 10*3/uL (ref 0.0–0.2)
Basos: 1 %
EOS (ABSOLUTE): 0.3 10*3/uL (ref 0.0–0.4)
Eos: 3 %
Hematocrit: 47.4 % — ABNORMAL HIGH (ref 34.0–46.6)
Hemoglobin: 15.1 g/dL (ref 11.1–15.9)
Immature Grans (Abs): 0 10*3/uL (ref 0.0–0.1)
Immature Granulocytes: 0 %
Lymphocytes Absolute: 2.5 10*3/uL (ref 0.7–3.1)
Lymphs: 32 %
MCH: 27.9 pg (ref 26.6–33.0)
MCHC: 31.9 g/dL (ref 31.5–35.7)
MCV: 88 fL (ref 79–97)
MONOCYTES: 4 %
MONOS ABS: 0.3 10*3/uL (ref 0.1–0.9)
NEUTROS PCT: 60 %
Neutrophils Absolute: 4.6 10*3/uL (ref 1.4–7.0)
Platelets: 264 10*3/uL (ref 150–379)
RBC: 5.41 x10E6/uL — ABNORMAL HIGH (ref 3.77–5.28)
RDW: 15.7 % — AB (ref 12.3–15.4)
WBC: 7.8 10*3/uL (ref 3.4–10.8)

## 2017-04-28 LAB — LIPID PANEL
CHOL/HDL RATIO: 2.3 ratio (ref 0.0–4.4)
CHOLESTEROL TOTAL: 194 mg/dL (ref 100–199)
HDL: 85 mg/dL (ref >39)
LDL CALC: 93 mg/dL (ref 0–99)
TRIGLYCERIDES: 80 mg/dL (ref 0–149)
VLDL Cholesterol Cal: 16 mg/dL (ref 5–40)

## 2017-04-28 LAB — TSH: TSH: 1.01 u[IU]/mL (ref 0.450–4.500)

## 2017-04-28 LAB — HEMOGLOBIN A1C
ESTIMATED AVERAGE GLUCOSE: 123 mg/dL
Hgb A1c MFr Bld: 5.9 % — ABNORMAL HIGH (ref 4.8–5.6)

## 2017-04-28 LAB — VITAMIN D 25 HYDROXY (VIT D DEFICIENCY, FRACTURES): Vit D, 25-Hydroxy: 24.5 ng/mL — ABNORMAL LOW (ref 30.0–100.0)

## 2017-04-28 LAB — T4, FREE: FREE T4: 1.48 ng/dL (ref 0.82–1.77)

## 2017-04-29 ENCOUNTER — Encounter: Payer: Self-pay | Admitting: Physician Assistant

## 2017-05-02 ENCOUNTER — Telehealth: Payer: Self-pay | Admitting: *Deleted

## 2017-05-02 NOTE — Telephone Encounter (Signed)
Letter sent.

## 2017-05-30 ENCOUNTER — Ambulatory Visit (INDEPENDENT_AMBULATORY_CARE_PROVIDER_SITE_OTHER): Payer: Medicare Other

## 2017-05-30 VITALS — BP 104/74 | HR 92 | Temp 98.5°F | Ht 62.0 in | Wt 225.5 lb

## 2017-05-30 DIAGNOSIS — Z0000 Encounter for general adult medical examination without abnormal findings: Secondary | ICD-10-CM

## 2017-05-30 NOTE — Progress Notes (Signed)
Subjective:   Jordan [name] is a 66 y.o. female who presents for an Initial Medicare Annual Wellness Visit.  Review of Systems    N/A  Cardiac Risk Factors include: advanced age (>14men, >64 women);hypertension;dyslipidemia;obesity (BMI >30kg/m2)     Objective:    Today's Vitals   05/30/17 1108  BP: 104/74  Pulse: 92  Temp: 98.5 F (36.9 C)  TempSrc: Oral  SpO2: 97%  Weight: 225 lb 8 oz (102.3 kg)  Height: 5\' 2"  (1.575 m)   Body mass index is 41.24 kg/m.   Current Medications (verified) Outpatient Encounter Prescriptions as of 05/30/2017  Medication Sig  . aspirin 81 MG tablet Take 81 mg by mouth daily.  Marland Kitchen buPROPion (WELLBUTRIN XL) 150 MG 24 hr tablet TAKE 1 TABLET BY MOUTH EVERY DAY  . diphenhydrAMINE (BENADRYL) 25 MG tablet Take 25 mg by mouth daily as needed for allergies.  Marland Kitchen levothyroxine (SYNTHROID, LEVOTHROID) 112 MCG tablet Take 1 tablet (112 mcg total) by mouth daily.  . meloxicam (MOBIC) 15 MG tablet TAKE 1 TABLET BY MOUTH EVERY DAY  . nitroGLYCERIN (NITROSTAT) 0.4 MG SL tablet Place 1 tablet (0.4 mg total) under the tongue every 5 (five) minutes as needed for chest pain.  . Prenatal Multivit-Min-Fe-FA (PRENATAL VITAMINS PO) Take 1 tablet by mouth daily.   . simvastatin (ZOCOR) 40 MG tablet Take 1 tablet (40 mg total) by mouth at bedtime.  . triamterene-hydrochlorothiazide (MAXZIDE-25) 37.5-25 MG tablet Take 1 tablet by mouth daily.  . [DISCONTINUED] mupirocin ointment (BACTROBAN) 2 % Apply 1 application topically 3 (three) times daily.  . [DISCONTINUED] oxyCODONE-acetaminophen (PERCOCET) 5-325 MG tablet Take 1-2 tablets by mouth every 4 (four) hours as needed.  . [DISCONTINUED] ondansetron (ZOFRAN) 4 MG tablet Take 1 tablet (4 mg total) by mouth every 8 (eight) hours as needed for nausea or vomiting. (Patient not taking: Reported on 05/30/2017)   Facility-Administered Encounter Medications as of 05/30/2017  Medication  . chlorhexidine (HIBICLENS) 4 %  liquid 1 application    Allergies (verified) Patient has no known allergies.   History: Past Medical History:  Diagnosis Date  . Anemia   . Arthritis    oa left shoulder  . Back pain    buldging disc  . Blood clot of neck vein   . Breast cancer ([place]) 10/17/2014   lower inner quadrant of the right breast  . Cancer ([place])    colon  . Constipation   . Depression    takes Wellbutrin daily  . Dizziness    r/t side effects from meds  . Family history of adverse reaction to anesthesia    It is hard for my mother to wake up from anesthesia.  . Generalized headaches    due to allergies, sinus  . GERD (gastroesophageal reflux disease)    takes Protonix as needed  . Hemorrhoids   . History of bronchitis    last time about 6-68yrs ago  . History of colon polyps   . History of hiatal hernia   . History of migraine    last one about 15+yrs ago  . History of UTI   . Hx of seasonal allergies    takes OTC allergy med nightly  . Hyperlipidemia    takes Zetia and Zocor daily  . Hypertension    takes Maxzide and Metoprolol daily  . Hypothyroidism    takes Synthroid daily  . Joint pain   . Leg swelling   . Mass of colon   . Nasal  congestion   . Nausea   . Pneumonia    walking about 6-39yrs ago  . PONV (postoperative nausea and vomiting)    pt states she is very easy to sedate  . S/P radiation therapy 03/11/2015 through 04/25/2015    Right breast 4500 cGy in 25 sessions, right breast boost 1600 cGy in 8 sessions   . Thyroid disease   . Tuberculosis    as little girl    . Vertigo    takes Meclizine prn  . Wears glasses   . Wears glasses    Past Surgical History:  Procedure Laterality Date  . ABDOMINAL HYSTERECTOMY    . APPENDECTOMY    . CARPAL TUNNEL RELEASE    . [place]  . COLONOSCOPY    . ESOPHAGOGASTRODUODENOSCOPY    . EXPLORATORY LAPAROTOMY    . KNEE SURGERY  1998   right -  arthroscopic  . PARTIAL COLECTOMY  03/30/2012   Procedure: PARTIAL COLECTOMY;  Surgeon: [name], MD;  Location: [facility];  Service: General;  Laterality: N/A;  . PORT-A-CATH REMOVAL N/A 06/25/2015   Procedure: REMOVAL PORT-A-CATH;  Surgeon: [name], MD;  Location: [facility];  Service: General;  Laterality: N/A;  . PORTACATH PLACEMENT    . RADIOACTIVE SEED GUIDED MASTECTOMY WITH AXILLARY SENTINEL LYMPH NODE BIOPSY Right 10/17/2014   Procedure: RADIOACTIVE SEED GUIDED PARTIAL MASTECTOMY WITH AXILLARY SENTINEL LYMPH NODE BIOPSY;  Surgeon: [name], MD;  Location: [facility];  Service: General;  Laterality: Right;  . TOTAL SHOULDER ARTHROPLASTY Left 03/04/2016   Procedure: LEFT TOTAL SHOULDER ARTHROPLASTY;  Surgeon: [name], MD;  Location: [facility];  Service: Orthopedics;  Laterality: Left;   Family History  Problem Relation Age of Onset  . Deep vein thrombosis Mother   . Dementia Mother   . Cancer Father 29       prostate  . Dementia Father   . Cancer Brother 71       prostate, stage IV colon (2012)  . Cancer Maternal Aunt 39       breast  . Cancer Maternal Grandmother 59       breast  . Cancer Paternal Grandmother        colon  . Pulmonary embolism Brother        long-distance truck driver (PE x 2)  . Cancer Brother 11       prostate  . Cancer Brother        prostate  . Heart disease Brother   . Cancer Maternal Aunt 75       unknown type  . Mental illness Sister        history of Depression  . Seizures Sister 8       s/p traumatic brain injury  . Cancer Sister        lung cancer (NON SMOKER)  . Diabetes Maternal Grandfather   . Cancer Maternal Uncle        prostate  . Deep vein thrombosis Sister    Social History   Occupational History  . retired Pharmacist, hospital    Social History Main Topics  . Smoking status: Never Smoker  . Smokeless tobacco: Never Used  . Alcohol use Yes     Comment: RARELY  . Drug use: Yes    Types: Marijuana     Comment: last  use 1 week ago 03/02/16  . Sexual activity: Yes    Partners: Male    Birth control/ protection:  Surgical    Tobacco Counseling Counseling given: Not Answered   Activities of Daily Living In your present state of health, do you have any difficulty performing the following activities: 05/30/2017  Hearing? N  Vision? Y  Comment vision has a hard time focusing at times  Difficulty concentrating or making decisions? N  Walking or climbing stairs? N  Dressing or bathing? N  Doing errands, shopping? N  Preparing Food and eating ? N  Using the Toilet? N  In the past six months, have you accidently leaked urine? Y  Comment has stress incontinence   Do you have problems with loss of bowel control? Y  Comment 1 or 2 times a year   Managing your Medications? N  Managing your Finances? N  Housekeeping or managing your Housekeeping? N  Some recent data might be hidden    Immunizations and Health Maintenance Immunization History  Administered Date(s) Administered  . Influenza Split 05/06/2011, 06/20/2012  . Influenza,inj,Quad PF,6+ Mos 06/07/2013, 08/20/2014, 06/05/2015, 06/22/2016, 04/27/2017  . Pneumococcal Conjugate-13 09/16/2015  . Pneumococcal Polysaccharide-23 11/06/2013  . Tdap 07/20/2011  . Zoster 08/17/2011   There are no preventive care reminders to display for this patient.  Patient Care Team: Harrison Mons, PA-C as PCP - General (Physician Assistant) Jovita Kussmaul, MD as Consulting Physician (General Surgery) Magrinat, Virgie Dad, MD as Consulting Physician (Oncology) Arloa Koh, MD as Consulting Physician (Radiation Oncology) Holley Bouche, NP as Nurse Practitioner (Nurse Practitioner) Sylvan Cheese, NP as Nurse Practitioner (Nurse Practitioner)  Indicate any recent Medical Services you may have received from other than [facility] providers in the past year (date may be approximate).     Assessment:   This is a routine wellness examination for  Marine.   Hearing/Vision screen Vision Screening Comments: Patient does not see an eye doctor on a regular basis.   Dietary issues and exercise activities discussed: Current Exercise Habits: The patient does not participate in regular exercise at present, Exercise limited by: None identified  Goals    . Eat more fruits and vegetables          Patient is working on eating more healthier foods since she was recently diagnosed with prediabetes.      Depression Screen PHQ 2/9 Scores 05/30/2017 04/27/2017 02/03/2017 01/27/2017 12/07/2016 06/22/2016 03/30/2016  PHQ - 2 Score 0 0 0 0 0 1 2  PHQ- 9 Score - - - - - - 8  Exception Documentation - Medical reason - - - - -    Fall Risk Fall Risk  05/30/2017 04/27/2017 02/03/2017 01/27/2017 12/07/2016  Falls in the past year? No Yes No No No  Number falls in past yr: - 1 - - -  Comment - Pt states she didn't fall but had a stumble this past weekend  and hit her head on the door frame. - - -  Injury with Fall? - No - - -  Comment - - - - -  Risk for fall due to : - - - - -  Risk for fall due to: Comment - - - - -  Follow up - Falls evaluation completed - - -    Cognitive Function:     6CIT Screen 05/30/2017  What Year? 0 points  What month? 0 points  What time? 0 points  Count back from 20 0 points  Months in reverse 0 points  Repeat phrase 2 points  Total Score 2    Screening Tests Health Maintenance  Topic  Date Due  . MAMMOGRAM  09/08/2018  . TETANUS/TDAP  07/19/2021  . COLONOSCOPY  03/31/2023  . INFLUENZA VACCINE  Completed  . DEXA SCAN  Completed  . Hepatitis C Screening  Completed  . PNA vac Low Risk Adult  Completed      Plan:   I have personally reviewed and noted the following in the patient's chart:   . Medical and social history . Use of alcohol, tobacco or illicit drugs  . Current medications and supplements . Functional ability and status . Nutritional status . Physical activity . Advanced directives . List of  other physicians . Hospitalizations, surgeries, and ER visits in previous 12 months . Vitals . Screenings to include cognitive, depression, and falls . Referrals and appointments  In addition, I have reviewed and discussed with patient certain preventive protocols, quality metrics, and best practice recommendations. A written personalized care plan for preventive services as well as general preventive health recommendations were provided to patient.     Andrez Grime, LPN   03/06/5749

## 2017-05-30 NOTE — Patient Instructions (Addendum)
Jordan [name] , Thank you for taking time to come for your Medicare Wellness Visit. I appreciate your ongoing commitment to your health goals. Please review the following plan we discussed and let me know if I can assist you in the future.   Screening recommendations/referrals: Colonoscopy: up to date, next due 11/10/2026 Mammogram: up to date, next due 09/08/2018 Bone Density: up to date, next due 09/04/2019 Recommended yearly ophthalmology/optometry visit for glaucoma screening and checkup Recommended yearly dental visit for hygiene and checkup  Vaccinations: Influenza vaccine: up to date Pneumococcal vaccine: up to date Tdap vaccine: up to date, next due 07/19/2021 Shingles vaccine: up to date    Advanced directives: Advance directive discussed with you today. Even though you declined this today please call our office should you change your mind and we can give you the proper paperwork for you to fill out.   Conditions/risks identified: Continue working on eating more healthier foods since you was recently diagnosed with prediabetes.  Next appointment: 10/25/2017 @ 11:20 am with [place] 65 Years and Older, Female Preventive care refers to lifestyle choices and visits with your health care provider that can promote health and wellness. What does preventive care include?  A yearly physical exam. This is also called an annual well check.  Dental exams once or twice a year.  Routine eye exams. Ask your health care provider how often you should have your eyes checked.  Personal lifestyle choices, including:  Daily care of your teeth and gums.  Regular physical activity.  Eating a healthy diet.  Avoiding tobacco and drug use.  Limiting alcohol use.  Practicing safe sex.  Taking low-dose aspirin every day.  Taking vitamin and mineral supplements as recommended by your health care provider. What happens during an annual well check? The services and  screenings done by your health care provider during your annual well check will depend on your age, overall health, lifestyle risk factors, and family history of disease. Counseling  Your health care provider may ask you questions about your:  Alcohol use.  Tobacco use.  Drug use.  Emotional well-being.  Home and relationship well-being.  Sexual activity.  Eating habits.  History of falls.  Memory and ability to understand (cognition).  Work and work Statistician.  Reproductive health. Screening  You may have the following tests or measurements:  Height, weight, and BMI.  Blood pressure.  Lipid and cholesterol levels. These may be checked every 5 years, or more frequently if you are over 75 years old.  Skin check.  Lung cancer screening. You may have this screening every year starting at age 37 if you have a 30-pack-year history of smoking and currently smoke or have quit within the past 15 years.  Fecal occult blood test (FOBT) of the stool. You may have this test every year starting at age 61.  Flexible sigmoidoscopy or colonoscopy. You may have a sigmoidoscopy every 5 years or a colonoscopy every 10 years starting at age 66.  Hepatitis C blood test.  Hepatitis B blood test.  Sexually transmitted disease (STD) testing.  Diabetes screening. This is done by checking your blood sugar (glucose) after you have not eaten for a while (fasting). You may have this done every 1-3 years.  Bone density scan. This is done to screen for osteoporosis. You may have this done starting at age 45.  Mammogram. This may be done every 1-2 years. Talk to your health care provider about how often you  should have regular mammograms. Talk with your health care provider about your test results, treatment options, and if necessary, the need for more tests. Vaccines  Your health care provider may recommend certain vaccines, such as:  Influenza vaccine. This is recommended every  year.  Tetanus, diphtheria, and acellular pertussis (Tdap, Td) vaccine. You may need a Td booster every 10 years.  Zoster vaccine. You may need this after age 2.  Pneumococcal 13-valent conjugate (PCV13) vaccine. One dose is recommended after age 63.  Pneumococcal polysaccharide (PPSV23) vaccine. One dose is recommended after age 80. Talk to your health care provider about which screenings and vaccines you need and how often you need them. This information is not intended to replace advice given to you by your health care provider. Make sure you discuss any questions you have with your health care provider. Document Released: 08/29/2015 Document Revised: 04/21/2016 Document Reviewed: 06/03/2015 Elsevier Interactive Patient Education  2017 [employer] Prevention in the Home Falls can cause injuries. They can happen to people of all ages. There are many things you can do to make your home safe and to help prevent falls. What can I do on the outside of my home?  Regularly fix the edges of walkways and driveways and fix any cracks.  Remove anything that might make you trip as you walk through a door, such as a raised step or threshold.  Trim any bushes or trees on the path to your home.  Use bright outdoor lighting.  Clear any walking paths of anything that might make someone trip, such as rocks or tools.  Regularly check to see if handrails are loose or broken. Make sure that both sides of any steps have handrails.  Any raised decks and porches should have guardrails on the edges.  Have any leaves, snow, or ice cleared regularly.  Use sand or salt on walking paths during winter.  Clean up any spills in your garage right away. This includes oil or grease spills. What can I do in the bathroom?  Use night lights.  Install grab bars by the toilet and in the tub and shower. Do not use towel bars as grab bars.  Use non-skid mats or decals in the tub or shower.  If you  need to sit down in the shower, use a plastic, non-slip stool.  Keep the floor dry. Clean up any water that spills on the floor as soon as it happens.  Remove soap buildup in the tub or shower regularly.  Attach bath mats securely with double-sided non-slip rug tape.  Do not have throw rugs and other things on the floor that can make you trip. What can I do in the bedroom?  Use night lights.  Make sure that you have a light by your bed that is easy to reach.  Do not use any sheets or blankets that are too big for your bed. They should not hang down onto the floor.  Have a firm chair that has side arms. You can use this for support while you get dressed.  Do not have throw rugs and other things on the floor that can make you trip. What can I do in the kitchen?  Clean up any spills right away.  Avoid walking on wet floors.  Keep items that you use a lot in easy-to-reach places.  If you need to reach something above you, use a strong step stool that has a grab bar.  Keep electrical cords out  of the way.  Do not use floor polish or wax that makes floors slippery. If you must use wax, use non-skid floor wax.  Do not have throw rugs and other things on the floor that can make you trip. What can I do with my stairs?  Do not leave any items on the stairs.  Make sure that there are handrails on both sides of the stairs and use them. Fix handrails that are broken or loose. Make sure that handrails are as long as the stairways.  Check any carpeting to make sure that it is firmly attached to the stairs. Fix any carpet that is loose or worn.  Avoid having throw rugs at the top or bottom of the stairs. If you do have throw rugs, attach them to the floor with carpet tape.  Make sure that you have a light switch at the top of the stairs and the bottom of the stairs. If you do not have them, ask someone to add them for you. What else can I do to help prevent falls?  Wear shoes  that:  Do not have high heels.  Have rubber bottoms.  Are comfortable and fit you well.  Are closed at the toe. Do not wear sandals.  If you use a stepladder:  Make sure that it is fully opened. Do not climb a closed stepladder.  Make sure that both sides of the stepladder are locked into place.  Ask someone to hold it for you, if possible.  Clearly mark and make sure that you can see:  Any grab bars or handrails.  First and last steps.  Where the edge of each step is.  Use tools that help you move around (mobility aids) if they are needed. These include:  Canes.  Walkers.  Scooters.  Crutches.  Turn on the lights when you go into a dark area. Replace any light bulbs as soon as they burn out.  Set up your furniture so you have a clear path. Avoid moving your furniture around.  If any of your floors are uneven, fix them.  If there are any pets around you, be aware of where they are.  Review your medicines with your doctor. Some medicines can make you feel dizzy. This can increase your chance of falling. Ask your doctor what other things that you can do to help prevent falls. This information is not intended to replace advice given to you by your health care provider. Make sure you discuss any questions you have with your health care provider. Document Released: 05/29/2009 Document Revised: 01/08/2016 Document Reviewed: 09/06/2014 Elsevier Interactive Patient Education  2017 [employer].

## 2017-06-20 ENCOUNTER — Encounter: Payer: Self-pay | Admitting: Physician Assistant

## 2017-06-20 ENCOUNTER — Ambulatory Visit (INDEPENDENT_AMBULATORY_CARE_PROVIDER_SITE_OTHER): Payer: Medicare Other | Admitting: Physician Assistant

## 2017-06-20 VITALS — BP 124/78 | HR 104 | Temp 98.4°F | Resp 16 | Ht 62.0 in | Wt 221.0 lb

## 2017-06-20 DIAGNOSIS — R21 Rash and other nonspecific skin eruption: Secondary | ICD-10-CM | POA: Diagnosis not present

## 2017-06-20 MED ORDER — TRIAMCINOLONE ACETONIDE 0.1 % EX CREA: 1.0000 "application " | TOPICAL_CREAM | Freq: Two times a day (BID) | CUTANEOUS | 0 refills | Status: AC

## 2017-06-20 MED ORDER — CEPHALEXIN 500 MG PO CAPS: 500.0000 mg | ORAL_CAPSULE | Freq: Four times a day (QID) | ORAL | 0 refills | Status: DC

## 2017-06-20 NOTE — Patient Instructions (Addendum)
Please continue to monitor your leg.  If the redness comes out of the line, pus drainage-- I need you to return. You can use warm compresses Take the antibiotic as prescribed.       IF you received an x-ray today, you will receive an invoice from [facility]. Please contact [facility] at [phone] with questions or concerns regarding your invoice.   IF you received labwork today, you will receive an invoice from [place]. Please contact [facility] at [phone] with questions or concerns regarding your invoice.   Our billing staff will not be able to assist you with questions regarding bills from these companies.  You will be contacted with the lab results as soon as they are available. The fastest way to get your results is to activate your My Chart account. Instructions are located on the last page of this paperwork. If you have not heard from [place] regarding the results in 2 weeks, please contact this office.

## 2017-06-20 NOTE — Progress Notes (Signed)
PRIMARY CARE AT [facility] [address] [place] [address] [phone]  Date:  06/20/2017   Name:  Jordan [name]   DOB:  Jan 18, 1951   MRN:  [id number]  PCP:  Harrison Mons, PA-C    History of Present Illness:  Jordan [name] is a 66 y.o. female patient who presents to PCP with  Chief Complaint  Patient presents with  . Insect Bite    pt was raking leaves and may have got bitten,.  . Rash    on left leg, red , hot to touch and swollen. bumps on chest     Patient reports leg pain for the last 5 days.  Patient states that she noticed a bump on her left leg that has progressively worsened with redness and two blisters.  She also has some bumps on her chest that are mildly itchy and sore.  She was doing yard work just prior to the onset of symptoms.  No fever. History of cellulitis.   a1c 7.7  Patient Active Problem List   Diagnosis Date Noted  . Lymphedema of right upper extremity 12/07/2016  . BMI 40.0-44.9, adult ([place]) 06/22/2016  . S/P shoulder replacement 03/04/2016  . Constipation 12/16/2014  . Chemotherapy-induced nausea 12/16/2014  . Hyperglycemia 12/09/2014  . Internal jugular vein thrombosis ([place])   . Leukocytosis   . DVT (deep venous thrombosis) ([place]) 12/02/2014  . Osteoarthritis of both feet 11/26/2014  . Poor venous access   . Colorectal cancer ([place]) 10/03/2014  . Malignant neoplasm of lower-inner quadrant of right breast of female, estrogen receptor negative ([place]) 09/27/2014  . Anemia, iron deficiency 04/25/2013  . GERD (gastroesophageal reflux disease) 02/12/2013  . HTN (hypertension) 10/21/2011  . Hypothyroidism 10/21/2011  . Depression 10/21/2011  . Hyperlipidemia 10/21/2011  . Lung nodules 10/21/2011  . Vertigo 10/21/2011  . Vitamin D deficiency 10/21/2011    Past Medical History:  Diagnosis Date  . Anemia   . Arthritis    oa left shoulder  . Back pain    buldging disc  . Blood clot of neck vein   . Breast cancer ([place]) 10/17/2014   lower  inner quadrant of the right breast  . Cancer ([place])    colon  . Constipation   . Depression    takes Wellbutrin daily  . Dizziness    r/t side effects from meds  . Family history of adverse reaction to anesthesia    It is hard for my mother to wake up from anesthesia.  . Generalized headaches    due to allergies, sinus  . GERD (gastroesophageal reflux disease)    takes Protonix as needed  . Hemorrhoids   . History of bronchitis    last time about 6-46yrs ago  . History of colon polyps   . History of hiatal hernia   . History of migraine    last one about 15+yrs ago  . History of UTI   . Hx of seasonal allergies    takes OTC allergy med nightly  . Hyperlipidemia    takes Zetia and Zocor daily  . Hypertension    takes Maxzide and Metoprolol daily  . Hypothyroidism    takes Synthroid daily  . Joint pain   . Leg swelling   . Mass of colon   . Nasal congestion   . Nausea   . Pneumonia    walking about 6-46yrs ago  . PONV (postoperative nausea and vomiting)    pt states she is very easy  to sedate  . S/P radiation therapy 03/11/2015 through 04/25/2015    Right breast 4500 cGy in 25 sessions, right breast boost 1600 cGy in 8 sessions   . Thyroid disease   . Tuberculosis    as little girl    . Vertigo    takes Meclizine prn  . Wears glasses   . Wears glasses     Past Surgical History:  Procedure Laterality Date  . ABDOMINAL HYSTERECTOMY    . APPENDECTOMY    . CARPAL TUNNEL RELEASE    . [place]  . COLONOSCOPY    . ESOPHAGOGASTRODUODENOSCOPY    . EXPLORATORY LAPAROTOMY    . KNEE SURGERY  1998   right - arthroscopic  . PORTACATH PLACEMENT      Social History   Tobacco Use  . Smoking status: Never Smoker  . Smokeless tobacco: Never Used  Substance Use Topics  . Alcohol use: Yes    Comment: RARELY  . Drug use: Yes    Types: Marijuana    Comment: last use 1 week ago  03/02/16    Family History  Problem Relation Age of Onset  . Deep vein thrombosis Mother   . Dementia Mother   . Cancer Father 80       prostate  . Dementia Father   . Cancer Brother 57       prostate, stage IV colon (2012)  . Cancer Maternal Aunt 51       breast  . Cancer Maternal Grandmother 61       breast  . Cancer Paternal Grandmother        colon  . Pulmonary embolism Brother        long-distance truck driver (PE x 2)  . Cancer Brother 18       prostate  . Cancer Brother        prostate  . Heart disease Brother   . Cancer Maternal Aunt 75       unknown type  . Mental illness Sister        history of Depression  . Seizures Sister 8       s/p traumatic brain injury  . Cancer Sister        lung cancer (NON SMOKER)  . Diabetes Maternal Grandfather   . Cancer Maternal Uncle        prostate  . Deep vein thrombosis Sister     No Known Allergies  Medication list has been reviewed and updated.  Current Outpatient Medications on File Prior to Visit  Medication Sig Dispense Refill  . aspirin 81 MG tablet Take 81 mg by mouth daily.    Marland Kitchen buPROPion (WELLBUTRIN XL) 150 MG 24 hr tablet TAKE 1 TABLET BY MOUTH EVERY DAY 90 tablet 3  . diphenhydrAMINE (BENADRYL) 25 MG tablet Take 25 mg by mouth daily as needed for allergies.    Marland Kitchen levothyroxine (SYNTHROID, LEVOTHROID) 112 MCG tablet Take 1 tablet (112 mcg total) by mouth daily. 90 tablet 3  . meloxicam (MOBIC) 15 MG tablet TAKE 1 TABLET BY MOUTH EVERY DAY 30 tablet 5  . nitroGLYCERIN (NITROSTAT) 0.4 MG SL tablet Place 1 tablet (0.4 mg total) under the tongue every 5 (five) minutes as needed for chest pain. 30 tablet 1  . Prenatal Multivit-Min-Fe-FA (PRENATAL VITAMINS PO) Take 1 tablet by mouth daily.     . simvastatin (ZOCOR) 40 MG tablet Take 1 tablet (40 mg total) by mouth at bedtime. 30 tablet 5  .  triamterene-hydrochlorothiazide (MAXZIDE-25) 37.5-25 MG tablet Take 1 tablet by mouth daily. 90 tablet 3   Current  Facility-Administered Medications on File Prior to Visit  Medication Dose Route Frequency Provider Last Rate Last Dose  . chlorhexidine (HIBICLENS) 4 % liquid 1 application  1 application Topical Once [name], MD        ROS ROS otherwise unremarkable unless listed above.  Physical Examination: BP 124/78   Pulse (!) 104   Temp 98.4 F (36.9 C) (Oral)   Resp 16   Ht 5\' 2"  (1.575 m)   Wt 221 lb (100.2 kg)   SpO2 98%   BMI 40.42 kg/m  Ideal Body Weight: Weight in (lb) to have BMI = 25: 136.4  Physical Exam  Constitutional: She is oriented to person, place, and time. She appears well-developed and well-nourished. No distress.  HENT:  Head: Normocephalic and atraumatic.  Right Ear: External ear normal.  Left Ear: External ear normal.  Eyes: Conjunctivae and EOM are normal. Pupils are equal, round, and reactive to light.  Cardiovascular: Normal rate.  Pulmonary/Chest: Effort normal. No respiratory distress.  Neurological: She is alert and oriented to person, place, and time.  Skin: She is not diaphoretic.  Chest wall erythematous scant papules on the chest. Left lower leg with edema and erythema with two blisters.  Mildly tender and warm.    Psychiatric: She has a normal mood and affect. Her behavior is normal.     Assessment and Plan: Jordan [name] is a 66 y.o. female who is here today for  Chief Complaint  Patient presents with  . Insect Bite    pt was raking leaves and may have got bitten,.  . Rash    on left leg, red , hot to touch and swollen. bumps on chest   advised of alarming symptoms to warrant immediate return Placed on keflex qid, and triamcinolone cream Rtc in 1 week for recheck  Rash and nonspecific skin eruption - Plan: cephALEXin (KEFLEX) 500 MG capsule, triamcinolone cream (KENALOG) 0.1 %  Ivar Drape, PA-C [facility] 11/7/20187:39 AM

## 2017-06-29 ENCOUNTER — Other Ambulatory Visit: Payer: Self-pay

## 2017-06-29 ENCOUNTER — Ambulatory Visit: Payer: Medicare Other | Admitting: Physician Assistant

## 2017-06-29 ENCOUNTER — Encounter: Payer: Self-pay | Admitting: Physician Assistant

## 2017-06-29 DIAGNOSIS — R21 Rash and other nonspecific skin eruption: Secondary | ICD-10-CM

## 2017-06-29 MED ORDER — CEPHALEXIN 500 MG PO CAPS: 500.0000 mg | ORAL_CAPSULE | Freq: Four times a day (QID) | ORAL | 0 refills | Status: DC

## 2017-06-29 NOTE — Progress Notes (Signed)
PRIMARY CARE AT [facility] [address] [place] [address] [phone]  Date:  06/29/2017   Name:  Jordan [name]   DOB:  1950-09-10   MRN:  [id number]  PCP:  Harrison Mons, PA-C    History of Present Illness:  Jordan [name] is a 66 y.o. female patient who presents to PCP with  Chief Complaint  Patient presents with  . Rash    follow up on rash on left leg      Patient is here for follow up of her right leg.  She was given keflex and advised to place topical steroid on the leg.  Patient reports that she has had improvement of her leg pain.  She is having no itching.  The two blisters have resolved.  She has no fever or drainage.  The chest lesions have also cleared.    Patient Active Problem List   Diagnosis Date Noted  . Lymphedema of right upper extremity 12/07/2016  . BMI 40.0-44.9, adult ([place]) 06/22/2016  . S/P shoulder replacement 03/04/2016  . Constipation 12/16/2014  . Chemotherapy-induced nausea 12/16/2014  . Hyperglycemia 12/09/2014  . Internal jugular vein thrombosis ([place])   . Leukocytosis   . DVT (deep venous thrombosis) ([place]) 12/02/2014  . Osteoarthritis of both feet 11/26/2014  . Poor venous access   . Colorectal cancer ([place]) 10/03/2014  . Malignant neoplasm of lower-inner quadrant of right breast of female, estrogen receptor negative ([place]) 09/27/2014  . Anemia, iron deficiency 04/25/2013  . GERD (gastroesophageal reflux disease) 02/12/2013  . HTN (hypertension) 10/21/2011  . Hypothyroidism 10/21/2011  . Depression 10/21/2011  . Hyperlipidemia 10/21/2011  . Lung nodules 10/21/2011  . Vertigo 10/21/2011  . Vitamin D deficiency 10/21/2011    Past Medical History:  Diagnosis Date  . Anemia   . Arthritis    oa left shoulder  . Back pain    buldging disc  . Blood clot of neck vein   . Breast cancer ([place]) 10/17/2014   lower inner quadrant of the right breast  . Cancer ([place])    colon  . Constipation   . Depression    takes Wellbutrin daily  .  Dizziness    r/t side effects from meds  . Family history of adverse reaction to anesthesia    It is hard for my mother to wake up from anesthesia.  . Generalized headaches    due to allergies, sinus  . GERD (gastroesophageal reflux disease)    takes Protonix as needed  . Hemorrhoids   . History of bronchitis    last time about 6-56yrs ago  . History of colon polyps   . History of hiatal hernia   . History of migraine    last one about 15+yrs ago  . History of UTI   . Hx of seasonal allergies    takes OTC allergy med nightly  . Hyperlipidemia    takes Zetia and Zocor daily  . Hypertension    takes Maxzide and Metoprolol daily  . Hypothyroidism    takes Synthroid daily  . Joint pain   . Leg swelling   . Mass of colon   . Nasal congestion   . Nausea   . Pneumonia    walking about 6-39yrs ago  . PONV (postoperative nausea and vomiting)    pt states she is very easy to sedate  . S/P radiation therapy 03/11/2015 through 04/25/2015    Right breast 4500 cGy in 25 sessions, right breast boost 1600 cGy  in 8 sessions   . Thyroid disease   . Tuberculosis    as little girl    . Vertigo    takes Meclizine prn  . Wears glasses   . Wears glasses     Past Surgical History:  Procedure Laterality Date  . ABDOMINAL HYSTERECTOMY    . APPENDECTOMY    . CARPAL TUNNEL RELEASE    . [place]  . COLONOSCOPY    . ESOPHAGOGASTRODUODENOSCOPY    . EXPLORATORY LAPAROTOMY    . KNEE SURGERY  1998   right - arthroscopic  . PORTACATH PLACEMENT      Social History   Tobacco Use  . Smoking status: Never Smoker  . Smokeless tobacco: Never Used  Substance Use Topics  . Alcohol use: Yes    Comment: RARELY  . Drug use: Yes    Types: Marijuana    Comment: last use 1 week ago 03/02/16    Family History  Problem Relation Age of Onset  . Deep vein thrombosis Mother   . Dementia Mother   . Cancer  Father 23       prostate  . Dementia Father   . Cancer Brother 79       prostate, stage IV colon (2012)  . Cancer Maternal Aunt 78       breast  . Cancer Maternal Grandmother 64       breast  . Cancer Paternal Grandmother        colon  . Pulmonary embolism Brother        long-distance truck driver (PE x 2)  . Cancer Brother 82       prostate  . Cancer Brother        prostate  . Heart disease Brother   . Cancer Maternal Aunt 75       unknown type  . Mental illness Sister        history of Depression  . Seizures Sister 8       s/p traumatic brain injury  . Cancer Sister        lung cancer (NON SMOKER)  . Diabetes Maternal Grandfather   . Cancer Maternal Uncle        prostate  . Deep vein thrombosis Sister     No Known Allergies  Medication list has been reviewed and updated.  Current Outpatient Medications on File Prior to Visit  Medication Sig Dispense Refill  . aspirin 81 MG tablet Take 81 mg by mouth daily.    Marland Kitchen buPROPion (WELLBUTRIN XL) 150 MG 24 hr tablet TAKE 1 TABLET BY MOUTH EVERY DAY 90 tablet 3  . cephALEXin (KEFLEX) 500 MG capsule Take 1 capsule (500 mg total) 4 (four) times daily by mouth. 28 capsule 0  . diphenhydrAMINE (BENADRYL) 25 MG tablet Take 25 mg by mouth daily as needed for allergies.    Marland Kitchen levothyroxine (SYNTHROID, LEVOTHROID) 112 MCG tablet Take 1 tablet (112 mcg total) by mouth daily. 90 tablet 3  . meloxicam (MOBIC) 15 MG tablet TAKE 1 TABLET BY MOUTH EVERY DAY 30 tablet 5  . nitroGLYCERIN (NITROSTAT) 0.4 MG SL tablet Place 1 tablet (0.4 mg total) under the tongue every 5 (five) minutes as needed for chest pain. 30 tablet 1  . Prenatal Multivit-Min-Fe-FA (PRENATAL VITAMINS PO) Take 1 tablet by mouth daily.     . simvastatin (ZOCOR) 40 MG tablet Take 1 tablet (40 mg total) by mouth at bedtime. 30 tablet 5  . triamcinolone cream (  KENALOG) 0.1 % Apply 1 application 2 (two) times daily for 10 days topically. 30 g 0  . triamterene-hydrochlorothiazide  (MAXZIDE-25) 37.5-25 MG tablet Take 1 tablet by mouth daily. 90 tablet 3   Current Facility-Administered Medications on File Prior to Visit  Medication Dose Route Frequency Provider Last Rate Last Dose  . chlorhexidine (HIBICLENS) 4 % liquid 1 application  1 application Topical Once [name], MD        ROS ROS otherwise unremarkable unless listed above.  Physical Examination: BP 108/70   Pulse 85   Temp 98.6 F (37 C) (Oral)   Resp 18   Ht 5\' 2"  (1.575 m)   Wt 221 lb 9.6 oz (100.5 kg)   SpO2 95%   BMI 40.53 kg/m  Ideal Body Weight: Weight in (lb) to have BMI = 25: 136.4  Physical Exam  Constitutional: She is oriented to person, place, and time. She appears well-developed and well-nourished. No distress.  HENT:  Head: Normocephalic and atraumatic.  Right Ear: External ear normal.  Left Ear: External ear normal.  Eyes: Conjunctivae and EOM are normal. Pupils are equal, round, and reactive to light.  Cardiovascular: Normal rate.  Pulmonary/Chest: Effort normal. No respiratory distress.  Neurological: She is alert and oriented to person, place, and time.  Skin: She is not diaphoretic.  Skin is mildly hyperpigmented where the erythema was present.  No repeated tenderness of the lower leg as initially.  No drainage.   Lesions of the upper chest are dried papules.no erythema or swelling.  Psychiatric: She has a normal mood and affect. Her behavior is normal.     Assessment and Plan: Jordan [name] is a 66 y.o. female who is here today for cc of  Chief Complaint  Patient presents with  . Rash    follow up on rash on left leg   will extend so there is 10 days of treatment.   Rtc as needed.   Rash and nonspecific skin eruption - Plan: cephALEXin (KEFLEX) 500 MG capsule  Ivar Drape, PA-C [facility] 11/20/201810:14 AM

## 2017-06-29 NOTE — Patient Instructions (Addendum)
I am extending the antibiotic to an additional 3 days.  Please take antibiotic as prescribed, and you may continue your regimen.  It may be good to also have an exterminator treatment at the home for evaluation and treatment.  Something to consider.     IF you received an x-ray today, you will receive an invoice from [facility]. Please contact [facility] at [phone] with questions or concerns regarding your invoice.   IF you received labwork today, you will receive an invoice from [facility]. Please contact [facility] at [phone] with questions or concerns regarding your invoice.   Our billing staff will not be able to assist you with questions regarding bills from these companies.  You will be contacted with the lab results as soon as they are available. The fastest way to get your results is to activate your My Chart account. Instructions are located on the last page of this paperwork. If you have not heard from [place] regarding the results in 2 weeks, please contact this office.

## 2017-08-11 ENCOUNTER — Other Ambulatory Visit: Payer: Self-pay | Admitting: Oncology

## 2017-08-11 DIAGNOSIS — Z853 Personal history of malignant neoplasm of breast: Secondary | ICD-10-CM

## 2017-09-06 ENCOUNTER — Other Ambulatory Visit: Payer: Self-pay | Admitting: Physician Assistant

## 2017-09-06 DIAGNOSIS — M19071 Primary osteoarthritis, right ankle and foot: Secondary | ICD-10-CM

## 2017-09-06 DIAGNOSIS — M19072 Primary osteoarthritis, left ankle and foot: Principal | ICD-10-CM

## 2017-09-09 ENCOUNTER — Ambulatory Visit
Admission: RE | Admit: 2017-09-09 | Discharge: 2017-09-09 | Disposition: A | Discharge status: Home / Self Care | Payer: Medicare Other | Source: Ambulatory Visit | Attending: Oncology | Admitting: Oncology

## 2017-09-09 DIAGNOSIS — Z853 Personal history of malignant neoplasm of breast: Secondary | ICD-10-CM

## 2017-09-09 HISTORY — DX: Personal history of irradiation: Z92.3

## 2017-09-09 HISTORY — DX: Personal history of antineoplastic chemotherapy: Z92.21

## 2017-09-29 ENCOUNTER — Encounter: Payer: Self-pay | Admitting: Family Medicine

## 2017-09-29 ENCOUNTER — Ambulatory Visit: Payer: Medicare Other | Admitting: Family Medicine

## 2017-09-29 ENCOUNTER — Other Ambulatory Visit: Payer: Self-pay

## 2017-09-29 VITALS — BP 122/74 | HR 96 | Temp 98.9°F | Ht 61.0 in | Wt 216.0 lb

## 2017-09-29 DIAGNOSIS — J101 Influenza due to other identified influenza virus with other respiratory manifestations: Secondary | ICD-10-CM | POA: Diagnosis not present

## 2017-09-29 LAB — POC INFLUENZA A&B (BINAX/QUICKVUE)
Influenza A, POC: POSITIVE — AB
Influenza B, POC: NEGATIVE

## 2017-09-29 MED ORDER — BENZONATATE 100 MG PO CAPS
100.0000 mg | ORAL_CAPSULE | Freq: Three times a day (TID) | ORAL | 0 refills | Status: DC | PRN
Start: 2017-09-29 — End: 2017-11-02

## 2017-09-29 NOTE — Patient Instructions (Addendum)
   IF you received an x-ray today, you will receive an invoice from [facility]. Please contact [facility] at [phone] with questions or concerns regarding your invoice.   IF you received labwork today, you will receive an invoice from [facility]. Please contact [facility] at [phone] with questions or concerns regarding your invoice.   Our billing staff will not be able to assist you with questions regarding bills from these companies.  You will be contacted with the lab results as soon as they are available. The fastest way to get your results is to activate your My Chart account. Instructions are located on the last page of this paperwork. If you have not heard from us regarding the results in 2 weeks, please contact this office.      Influenza, Adult Influenza, more commonly known as "the flu," is a viral infection that primarily affects the respiratory tract. The respiratory tract includes organs that help you breathe, such as the lungs, nose, and throat. The flu causes many common cold symptoms, as well as a high fever and body aches. The flu spreads easily from person to person (is contagious). Getting a flu shot (influenza vaccination) every year is the best way to prevent influenza. What are the causes? Influenza is caused by a virus. You can catch the virus by:  Breathing in droplets from an infected person's cough or sneeze.  Touching something that was recently contaminated with the virus and then touching your mouth, nose, or eyes.  What increases the risk? The following factors may make you more likely to get the flu:  Not cleaning your hands frequently with soap and water or alcohol-based hand sanitizer.  Having close contact with many people during cold and flu season.  Touching your mouth, eyes, or nose without washing or sanitizing your hands first.  Not drinking enough fluids or not eating a healthy diet.  Not getting enough sleep or  exercise.  Being under a high amount of stress.  Not getting a yearly (annual) flu shot.  You may be at a higher risk of complications from the flu, such as a severe lung infection (pneumonia), if you:  Are over the age of 65.  Are pregnant.  Have a weakened disease-fighting system (immune system). You may have a weakened immune system if you: ? Have HIV or AIDS. ? Are undergoing chemotherapy. ? Aretaking medicines that reduce the activity of (suppress) the immune system.  Have a long-term (chronic) illness, such as heart disease, kidney disease, diabetes, or lung disease.  Have a liver disorder.  Are obese.  Have anemia.  What are the signs or symptoms? Symptoms of this condition typically last 4-10 days and may include:  Fever.  Chills.  Headache, body aches, or muscle aches.  Sore throat.  Cough.  Runny or congested nose.  Chest discomfort and cough.  Poor appetite.  Weakness or tiredness (fatigue).  Dizziness.  Nausea or vomiting.  How is this diagnosed? This condition may be diagnosed based on your medical history and a physical exam. Your health care provider may do a nose or throat swab test to confirm the diagnosis. How is this treated? If influenza is detected early, you can be treated with antiviral medicine that can reduce the length of your illness and the severity of your symptoms. This medicine may be given by mouth (orally) or through an IV tube that is inserted in one of your veins. The goal of treatment is to relieve symptoms by taking   care of yourself at home. This may include taking over-the-counter medicines, drinking plenty of fluids, and adding humidity to the air in your home. In some cases, influenza goes away on its own. Severe influenza or complications from influenza may be treated in a hospital. Follow these instructions at home:  Take over-the-counter and prescription medicines only as told by your health care provider.  Use a  cool mist humidifier to add humidity to the air in your home. This can make breathing easier.  Rest as needed.  Drink enough fluid to keep your urine clear or pale yellow.  Cover your mouth and nose when you cough or sneeze.  Wash your hands with soap and water often, especially after you cough or sneeze. If soap and water are not available, use hand sanitizer.  Stay home from work or school as told by your health care provider. Unless you are visiting your health care provider, try to avoid leaving home until your fever has been gone for 24 hours without the use of medicine.  Keep all follow-up visits as told by your health care provider. This is important. How is this prevented?  Getting an annual flu shot is the best way to avoid getting the flu. You may get the flu shot in late summer, fall, or winter. Ask your health care provider when you should get your flu shot.  Wash your hands often or use hand sanitizer often.  Avoid contact with people who are sick during cold and flu season.  Eat a healthy diet, drink plenty of fluids, get enough sleep, and exercise regularly. Contact a health care provider if:  You develop new symptoms.  You have: ? Chest pain. ? Diarrhea. ? A fever.  Your cough gets worse.  You produce more mucus.  You feel nauseous or you vomit. Get help right away if:  You develop shortness of breath or difficulty breathing.  Your skin or nails turn a bluish color.  You have severe pain or stiffness in your neck.  You develop a sudden headache or sudden pain in your face or ear.  You cannot stop vomiting. This information is not intended to replace advice given to you by your health care provider. Make sure you discuss any questions you have with your health care provider. Document Released: 07/30/2000 Document Revised: 01/08/2016 Document Reviewed: 05/27/2015 Elsevier Interactive Patient Education  2017 Elsevier Inc.  

## 2017-09-29 NOTE — Progress Notes (Signed)
2/14/201910:50 AM  Jordan [name] 1950/10/02, 67 y.o. female [id number]  Chief Complaint  Patient presents with  . Cough    HPI:   Patient is a 67 y.o. female with past medical history significant for DM2, breast and colon cancer, HTN who presents today for 5 days of URI symptoms.   Non productive cough, headache, body aches, chest congestion, nausea, decreased appetite Sometimes coughs so much that she vomits Fever last night, 102.4, no meds today No diarrhea, SOB, rash Tolerating fluids well Had flu vaccine in sept 2018  Depression screen [facility] 2/9 09/29/2017 06/20/2017 05/30/2017  Decreased Interest 0 0 0  Down, Depressed, Hopeless 0 0 0  PHQ - 2 Score 0 0 0  Altered sleeping - - -  Tired, decreased energy - - -  Change in appetite - - -  Feeling bad or failure about yourself  - - -  Trouble concentrating - - -  Moving slowly or fidgety/restless - - -  Suicidal thoughts - - -  PHQ-9 Score - - -  Difficult doing work/chores - - -  Some recent data might be hidden    No Known Allergies  Prior to Admission medications   Medication Sig Start Date End Date Taking? Authorizing Provider  aspirin 81 MG tablet Take 81 mg by mouth daily.   Yes [provider]  buPROPion (WELLBUTRIN XL) 150 MG 24 hr tablet TAKE 1 TABLET BY MOUTH EVERY DAY 03/12/17  Yes Jeffery, Chelle, PA-C  cephALEXin (KEFLEX) 500 MG capsule Take 1 capsule (500 mg total) 4 (four) times daily by mouth. 06/29/17  Yes English, Stephanie D, PA  diphenhydrAMINE (BENADRYL) 25 MG tablet Take 25 mg by mouth daily as needed for allergies.   Yes [provider]  levothyroxine (SYNTHROID, LEVOTHROID) 112 MCG tablet Take 1 tablet (112 mcg total) by mouth daily. 01/06/17  Yes Jeffery, Chelle, PA-C  meloxicam (MOBIC) 15 MG tablet TAKE 1 TABLET BY MOUTH EVERY DAY 09/06/17  Yes Jeffery, Chelle, PA-C  nitroGLYCERIN (NITROSTAT) 0.4 MG SL tablet Place 1 tablet (0.4 mg total) under the tongue every 5 (five) minutes  as needed for chest pain. 11/23/12  Yes Jeffery, Chelle, PA-C  Prenatal Multivit-Min-Fe-FA (PRENATAL VITAMINS PO) Take 1 tablet by mouth daily.    Yes [provider]  simvastatin (ZOCOR) 40 MG tablet Take 1 tablet (40 mg total) by mouth at bedtime. 04/27/17  Yes Jeffery, Chelle, PA-C  triamterene-hydrochlorothiazide (MAXZIDE-25) 37.5-25 MG tablet Take 1 tablet by mouth daily. 12/07/16  Yes Harrison Mons, PA-C    Past Medical History:  Diagnosis Date  . Anemia   . Arthritis    oa left shoulder  . Back pain    buldging disc  . Blood clot of neck vein   . Breast cancer ([place]) 10/17/2014   lower inner quadrant of the right breast  . Cancer ([place])    colon  . Constipation   . Depression    takes Wellbutrin daily  . Dizziness    r/t side effects from meds  . Family history of adverse reaction to anesthesia    It is hard for my mother to wake up from anesthesia.  . Generalized headaches    due to allergies, sinus  . GERD (gastroesophageal reflux disease)    takes Protonix as needed  . Hemorrhoids   . History of bronchitis    last time about 6-64yrs ago  . History of colon polyps   . History of hiatal hernia   .  History of migraine    last one about 15+yrs ago  . History of UTI   . Hx of seasonal allergies    takes OTC allergy med nightly  . Hyperlipidemia    takes Zetia and Zocor daily  . Hypertension    takes Maxzide and Metoprolol daily  . Hypothyroidism    takes Synthroid daily  . Joint pain   . Leg swelling   . Mass of colon   . Nasal congestion   . Nausea   . Personal history of chemotherapy   . Personal history of radiation therapy   . Pneumonia    walking about 6-4yrs ago  . PONV (postoperative nausea and vomiting)    pt states she is very easy to sedate  . S/P radiation therapy 03/11/2015 through 04/25/2015    Right breast 4500 cGy in 25 sessions, right breast boost 1600 cGy in 8  sessions   . Thyroid disease   . Tuberculosis    as little girl    . Vertigo    takes Meclizine prn  . Wears glasses   . Wears glasses     Past Surgical History:  Procedure Laterality Date  . ABDOMINAL HYSTERECTOMY    . APPENDECTOMY    . BREAST LUMPECTOMY Right 2016  . CARPAL TUNNEL RELEASE    . [place]  . COLONOSCOPY    . ESOPHAGOGASTRODUODENOSCOPY    . EXPLORATORY LAPAROTOMY    . KNEE SURGERY  1998   right - arthroscopic  . PARTIAL COLECTOMY  03/30/2012   Procedure: PARTIAL COLECTOMY;  Surgeon: [name], MD;  Location: [facility];  Service: General;  Laterality: N/A;  . PORT-A-CATH REMOVAL N/A 06/25/2015   Procedure: REMOVAL PORT-A-CATH;  Surgeon: [name], MD;  Location: [facility];  Service: General;  Laterality: N/A;  . PORTACATH PLACEMENT    . RADIOACTIVE SEED GUIDED PARTIAL MASTECTOMY WITH AXILLARY SENTINEL LYMPH NODE BIOPSY Right 10/17/2014   Procedure: RADIOACTIVE SEED GUIDED PARTIAL MASTECTOMY WITH AXILLARY SENTINEL LYMPH NODE BIOPSY;  Surgeon: [name], MD;  Location: [facility];  Service: General;  Laterality: Right;  . TOTAL SHOULDER ARTHROPLASTY Left 03/04/2016   Procedure: LEFT TOTAL SHOULDER ARTHROPLASTY;  Surgeon: [name], MD;  Location: [place];  Service: Orthopedics;  Laterality: Left;    Social History   Tobacco Use  . Smoking status: Never Smoker  . Smokeless tobacco: Never Used  Substance Use Topics  . Alcohol use: Yes    Comment: RARELY    Family History  Problem Relation Age of Onset  . Deep vein thrombosis Mother   . Dementia Mother   . Cancer Father 93       prostate  . Dementia Father   . Cancer Brother 10       prostate, stage IV colon (2012)  . Cancer Maternal Aunt 24       breast  . Cancer Maternal Grandmother 24       breast  . Cancer Paternal Grandmother        colon  . Pulmonary embolism Brother        long-distance truck driver (PE x 2)  . Cancer Brother 57        prostate  . Cancer Brother        prostate  . Heart disease Brother   . Cancer Maternal Aunt 75       unknown type  . Mental illness Sister  history of Depression  . Seizures Sister 8       s/p traumatic brain injury  . Cancer Sister        lung cancer (NON SMOKER)  . Diabetes Maternal Grandfather   . Cancer Maternal Uncle        prostate  . Deep vein thrombosis Sister     ROS Per hpi  OBJECTIVE:  Blood pressure 122/74, pulse 96, temperature 98.9 F (37.2 C), temperature source Oral, height 5\' 1"  (1.549 m), weight 216 lb (98 kg), SpO2 100 %.  Physical Exam  Constitutional: She is oriented to person, place, and time and well-developed, well-nourished, and in no distress.  HENT:  Head: Normocephalic and atraumatic.  Right Ear: Hearing, tympanic membrane, external ear and ear canal normal.  Left Ear: Hearing, tympanic membrane, external ear and ear canal normal.  Mouth/Throat: Oropharynx is clear and moist.  Eyes: EOM are normal. Pupils are equal, round, and reactive to light.  Neck: Neck supple.  Cardiovascular: Normal rate, regular rhythm and normal heart sounds. Exam reveals no gallop and no friction rub.  No murmur heard. Pulmonary/Chest: Effort normal and breath sounds normal. She has no wheezes. She has no rales.  Lymphadenopathy:    She has no cervical adenopathy.  Neurological: She is alert and oriented to person, place, and time. Gait normal.  Skin: Skin is warm and dry.    Results for orders placed or performed in visit on 09/29/17 (from the past 24 hour(s))  POC Influenza A&B(BINAX/QUICKVUE)     Status: Abnormal   Collection Time: 09/29/17 10:30 AM  Result Value Ref Range   Influenza A, POC Positive (A) Negative   Influenza B, POC Negative Negative    ASSESSMENT and PLAN  1. Influenza A No respiratory compromise during this visit. > 48 hours of illness. Discussed supportive measures and RTC precautions.  - POC Influenza  A&B(BINAX/QUICKVUE)  Other orders - benzonatate (TESSALON) 100 MG capsule; Take 1-2 capsules (100-200 mg total) by mouth 3 (three) times daily as needed for cough.  Return if symptoms worsen or fail to improve.    Rutherford Guys, MD Primary Care at [place], [place] [address] Ph.  [phone] Fax [phone]

## 2017-10-25 ENCOUNTER — Ambulatory Visit: Payer: Medicare Other | Admitting: Physician Assistant

## 2017-11-02 ENCOUNTER — Other Ambulatory Visit: Payer: Self-pay

## 2017-11-02 ENCOUNTER — Encounter: Payer: Self-pay | Admitting: Physician Assistant

## 2017-11-02 ENCOUNTER — Other Ambulatory Visit: Payer: Self-pay | Admitting: Physician Assistant

## 2017-11-02 ENCOUNTER — Ambulatory Visit (INDEPENDENT_AMBULATORY_CARE_PROVIDER_SITE_OTHER): Payer: Medicare Other | Admitting: Physician Assistant

## 2017-11-02 ENCOUNTER — Ambulatory Visit: Payer: Medicare Other | Admitting: Physician Assistant

## 2017-11-02 VITALS — BP 114/68 | HR 86 | Temp 98.4°F | Resp 16 | Ht 61.0 in | Wt 218.8 lb

## 2017-11-02 DIAGNOSIS — E785 Hyperlipidemia, unspecified: Secondary | ICD-10-CM

## 2017-11-02 DIAGNOSIS — D509 Iron deficiency anemia, unspecified: Secondary | ICD-10-CM | POA: Diagnosis not present

## 2017-11-02 DIAGNOSIS — Z85038 Personal history of other malignant neoplasm of large intestine: Secondary | ICD-10-CM

## 2017-11-02 DIAGNOSIS — Z6841 Body Mass Index (BMI) 40.0 and over, adult: Secondary | ICD-10-CM

## 2017-11-02 DIAGNOSIS — I10 Essential (primary) hypertension: Secondary | ICD-10-CM | POA: Diagnosis not present

## 2017-11-02 DIAGNOSIS — E039 Hypothyroidism, unspecified: Secondary | ICD-10-CM

## 2017-11-02 DIAGNOSIS — R739 Hyperglycemia, unspecified: Secondary | ICD-10-CM

## 2017-11-02 DIAGNOSIS — E559 Vitamin D deficiency, unspecified: Secondary | ICD-10-CM | POA: Diagnosis not present

## 2017-11-02 DIAGNOSIS — F329 Major depressive disorder, single episode, unspecified: Secondary | ICD-10-CM

## 2017-11-02 DIAGNOSIS — F32A Depression, unspecified: Secondary | ICD-10-CM

## 2017-11-02 NOTE — Assessment & Plan Note (Signed)
Await results of A1C. Consider addition of metformin if A1C >7%.

## 2017-11-02 NOTE — Assessment & Plan Note (Signed)
Update labs today 

## 2017-11-02 NOTE — Assessment & Plan Note (Signed)
Well controlled. Continue Maxide-37.5/25.

## 2017-11-02 NOTE — Assessment & Plan Note (Signed)
Increase walking, with next goal of at least 5000 steps/day.

## 2017-11-02 NOTE — Assessment & Plan Note (Signed)
Await labs. Adjust regimen as indicated by results. If needed, change from simvastatin to atorvastatin or rosuvastatin.

## 2017-11-02 NOTE — Patient Instructions (Addendum)
Try OTC Zantac 75 mg twice a day for the next 2-4 weeks, to see if that helps the nausea.    IF you received an x-ray today, you will receive an invoice from [facility]. Please contact [facility] at [phone] with questions or concerns regarding your invoice.   IF you received labwork today, you will receive an invoice from [facility]. Please contact [facility] at [phone] with questions or concerns regarding your invoice.   Our billing staff will not be able to assist you with questions regarding bills from these companies.  You will be contacted with the lab results as soon as they are available. The fastest way to get your results is to activate your My Chart account. Instructions are located on the last page of this paperwork. If you have not heard from [place] regarding the results in 2 weeks, please contact this office.

## 2017-11-02 NOTE — Assessment & Plan Note (Signed)
Stable. Continue Wellbutrin XL 150 mg.

## 2017-11-02 NOTE — Assessment & Plan Note (Signed)
Await labs. Adjust regimen as indicated by results. Will need a refill before next visit. Wait to refill until we see results.

## 2017-11-02 NOTE — Progress Notes (Signed)
Patient ID: Jordan [name], female    DOB: Nov 02, 1950, 67 y.o.   MRN: [id number]  PCP: Harrison Mons, PA-C  Chief Complaint  Patient presents with  . Hyperlipidemia    follow up   . Hypertension    follow up     Subjective:   Presents for evaluation of hyperlipidemia and hypertension.  In general tolerates her medications well, without adverse effects. Recent influenza. Symptoms have resolved.  Mood wise, she still has alternate good and bad days.  Reports frequent nausea. Not improved with eating. Some exacerbation with some foods.   Review of Systems  Constitutional: Negative for activity change, appetite change, fatigue and unexpected weight change.  HENT: Negative for congestion, dental problem, ear pain, hearing loss, mouth sores, postnasal drip, rhinorrhea, sneezing, sore throat, tinnitus and trouble swallowing.   Eyes: Negative for photophobia, pain, redness and visual disturbance.  Respiratory: Negative for cough, chest tightness and shortness of breath.   Cardiovascular: Negative for chest pain, palpitations and leg swelling.  Gastrointestinal: Positive for nausea. Negative for abdominal pain, blood in stool, constipation, diarrhea and vomiting.  Endocrine: Negative for cold intolerance, heat intolerance, polydipsia, polyphagia and polyuria.  Genitourinary: Negative for dysuria, frequency, hematuria and urgency.  Musculoskeletal: Negative for arthralgias, gait problem, myalgias and neck stiffness.  Skin: Negative for rash.  Neurological: Negative for dizziness, speech difficulty, weakness, light-headedness, numbness and headaches.  Hematological: Negative for adenopathy.  Psychiatric/Behavioral: Negative for confusion and sleep disturbance. The patient is not nervous/anxious.        Patient Active Problem List   Diagnosis Date Noted  . Lymphedema of right upper extremity 12/07/2016  . BMI 40.0-44.9, adult ([place]) 06/22/2016  . S/P shoulder replacement  03/04/2016  . Constipation 12/16/2014  . Chemotherapy-induced nausea 12/16/2014  . Hyperglycemia 12/09/2014  . Internal jugular vein thrombosis ([place])   . Leukocytosis   . DVT (deep venous thrombosis) ([place]) 12/02/2014  . Osteoarthritis of both feet 11/26/2014  . Poor venous access   . Colorectal cancer ([place]) 10/03/2014  . Malignant neoplasm of lower-inner quadrant of right breast of female, estrogen receptor negative ([place]) 09/27/2014  . Anemia, iron deficiency 04/25/2013  . GERD (gastroesophageal reflux disease) 02/12/2013  . HTN (hypertension) 10/21/2011  . Hypothyroidism 10/21/2011  . Depression 10/21/2011  . Hyperlipidemia 10/21/2011  . Lung nodules 10/21/2011  . Vertigo 10/21/2011  . Vitamin D deficiency 10/21/2011     Prior to Admission medications   Medication Sig Start Date End Date Taking? Authorizing Provider  aspirin 81 MG tablet Take 81 mg by mouth daily.   Yes [provider]  buPROPion (WELLBUTRIN XL) 150 MG 24 hr tablet TAKE 1 TABLET BY MOUTH EVERY DAY 03/12/17  Yes Shayaan Parke, PA-C  diphenhydrAMINE (BENADRYL) 25 MG tablet Take 25 mg by mouth daily as needed for allergies.   Yes [provider]  levothyroxine (SYNTHROID, LEVOTHROID) 112 MCG tablet Take 1 tablet (112 mcg total) by mouth daily. 01/06/17  Yes Olanda Downie, PA-C  meloxicam (MOBIC) 15 MG tablet TAKE 1 TABLET BY MOUTH EVERY DAY 09/06/17  Yes Ardice Boyan, PA-C  nitroGLYCERIN (NITROSTAT) 0.4 MG SL tablet Place 1 tablet (0.4 mg total) under the tongue every 5 (five) minutes as needed for chest pain. 11/23/12  Yes Korvin Valentine, PA-C  Prenatal Multivit-Min-Fe-FA (PRENATAL VITAMINS PO) Take 1 tablet by mouth daily.    Yes [provider]  simvastatin (ZOCOR) 40 MG tablet Take 1 tablet (40 mg total) by mouth at bedtime. 04/27/17  Yes Eino Whitner, PA-C  triamterene-hydrochlorothiazide (MAXZIDE-25) 37.5-25 MG tablet Take 1 tablet by mouth daily. 12/07/16  Yes Corianne Buccellato,  PA-C     No Known Allergies     Objective:  Physical Exam  Constitutional: She is oriented to person, place, and time. She appears well-developed and well-nourished. She is active and cooperative. No distress.  BP 114/68   Pulse 86   Temp 98.4 F (36.9 C)   Resp 16   Ht 5\' 1"  (1.549 m)   Wt 218 lb 12.8 oz (99.2 kg)   SpO2 97%   BMI 41.34 kg/m   HENT:  Head: Normocephalic and atraumatic.  Right Ear: Hearing normal.  Left Ear: Hearing normal.  Eyes: Conjunctivae are normal. No scleral icterus.  Neck: Normal range of motion. Neck supple. No thyromegaly present.  Cardiovascular: Normal rate, regular rhythm and normal heart sounds.  Pulses:      Radial pulses are 2+ on the right side, and 2+ on the left side.  Pulmonary/Chest: Effort normal and breath sounds normal.  Lymphadenopathy:       Head (right side): No tonsillar, no preauricular, no posterior auricular and no occipital adenopathy present.       Head (left side): No tonsillar, no preauricular, no posterior auricular and no occipital adenopathy present.    She has no cervical adenopathy.       Right: No supraclavicular adenopathy present.       Left: No supraclavicular adenopathy present.  Neurological: She is alert and oriented to person, place, and time. No sensory deficit.  Skin: Skin is warm, dry and intact. No rash noted. No cyanosis or erythema. Nails show no clubbing.  Psychiatric: She has a normal mood and affect. Her speech is normal and behavior is normal.    Wt Readings from Last 3 Encounters:  11/02/17 218 lb 12.8 oz (99.2 kg)  09/29/17 216 lb (98 kg)  06/29/17 221 lb 9.6 oz (100.5 kg)  04/27/2017 222 lb     Assessment & Plan:   Problem List Items Addressed This Visit    HTN (hypertension) - Primary (Chronic)    Well controlled. Continue Maxide-37.5/25.      Relevant Orders   CBC with Differential/Platelet   Comprehensive metabolic panel   Hypothyroidism (Chronic)    Await labs. Adjust regimen  as indicated by results. Will need a refill before next visit. Wait to refill until we see results.      Relevant Orders   TSH   T4, free   Depression (Chronic)    Stable. Continue Wellbutrin XL 150 mg.      Hyperlipidemia (Chronic)    Await labs. Adjust regimen as indicated by results. If needed, change from simvastatin to atorvastatin or rosuvastatin.      Relevant Orders   Comprehensive metabolic panel   Lipid panel   Vitamin D deficiency (Chronic)    Update labs today.      Relevant Orders   VITAMIN D 25 Hydroxy (Vit-D Deficiency, Fractures)   Anemia, iron deficiency    Await lab results. May need to resume iron supplementation.      Relevant Orders   CBC with Differential/Platelet   Hyperglycemia    Await results of A1C. Consider addition of metformin if A1C >7%.      Relevant Orders   Hemoglobin A1c   BMI 40.0-44.9, adult (HCC)    Increase walking, with next goal of at least 5000 steps/day.          Return  in about 6 months (around 05/05/2018) for re-evalaution and fasting labs.Fara Chute, PA-C [facility]

## 2017-11-02 NOTE — Assessment & Plan Note (Signed)
Await lab results. May need to resume iron supplementation.

## 2017-11-03 ENCOUNTER — Encounter: Payer: Self-pay | Admitting: Physician Assistant

## 2017-11-03 LAB — COMPREHENSIVE METABOLIC PANEL
A/G RATIO: 1.5 (ref 1.2–2.2)
ALK PHOS: 67 IU/L (ref 39–117)
ALT: 18 IU/L (ref 0–32)
AST: 23 IU/L (ref 0–40)
Albumin: 4 g/dL (ref 3.6–4.8)
BILIRUBIN TOTAL: 0.7 mg/dL (ref 0.0–1.2)
BUN/Creatinine Ratio: 16 (ref 12–28)
BUN: 13 mg/dL (ref 8–27)
CALCIUM: 8.8 mg/dL (ref 8.7–10.3)
CHLORIDE: 107 mmol/L — AB (ref 96–106)
CO2: 20 mmol/L (ref 20–29)
Creatinine, Ser: 0.83 mg/dL (ref 0.57–1.00)
GFR calc Af Amer: 83 mL/min/{1.73_m2} (ref >59)
GFR, EST NON AFRICAN AMERICAN: 72 mL/min/{1.73_m2} (ref >59)
Globulin, Total: 2.7 g/dL (ref 1.5–4.5)
Glucose: 88 mg/dL (ref 65–99)
POTASSIUM: 3.8 mmol/L (ref 3.5–5.2)
SODIUM: 147 mmol/L — AB (ref 134–144)
Total Protein: 6.7 g/dL (ref 6.0–8.5)

## 2017-11-03 LAB — CBC WITH DIFFERENTIAL/PLATELET
BASOS: 0 %
Basophils Absolute: 0 10*3/uL (ref 0.0–0.2)
EOS (ABSOLUTE): 0.3 10*3/uL (ref 0.0–0.4)
Eos: 3 %
Hematocrit: 43.9 % (ref 34.0–46.6)
Hemoglobin: 14.4 g/dL (ref 11.1–15.9)
IMMATURE GRANULOCYTES: 0 %
Immature Grans (Abs): 0 10*3/uL (ref 0.0–0.1)
LYMPHS ABS: 2.2 10*3/uL (ref 0.7–3.1)
Lymphs: 27 %
MCH: 27.9 pg (ref 26.6–33.0)
MCHC: 32.8 g/dL (ref 31.5–35.7)
MCV: 85 fL (ref 79–97)
Monocytes Absolute: 0.3 10*3/uL (ref 0.1–0.9)
Monocytes: 4 %
NEUTROS PCT: 66 %
Neutrophils Absolute: 5.3 10*3/uL (ref 1.4–7.0)
PLATELETS: 230 10*3/uL (ref 150–379)
RBC: 5.16 x10E6/uL (ref 3.77–5.28)
RDW: 15.7 % — AB (ref 12.3–15.4)
WBC: 8.2 10*3/uL (ref 3.4–10.8)

## 2017-11-03 LAB — LIPID PANEL
Chol/HDL Ratio: 2.7 ratio (ref 0.0–4.4)
Cholesterol, Total: 189 mg/dL (ref 100–199)
HDL: 71 mg/dL (ref >39)
LDL CALC: 101 mg/dL — AB (ref 0–99)
TRIGLYCERIDES: 85 mg/dL (ref 0–149)
VLDL CHOLESTEROL CAL: 17 mg/dL (ref 5–40)

## 2017-11-03 LAB — HEMOGLOBIN A1C
Est. average glucose Bld gHb Est-mCnc: 123 mg/dL
Hgb A1c MFr Bld: 5.9 % — ABNORMAL HIGH (ref 4.8–5.6)

## 2017-11-03 LAB — VITAMIN D 25 HYDROXY (VIT D DEFICIENCY, FRACTURES): Vit D, 25-Hydroxy: 31.1 ng/mL (ref 30.0–100.0)

## 2017-11-03 LAB — TSH: TSH: 0.554 u[IU]/mL (ref 0.450–4.500)

## 2017-11-03 LAB — T4, FREE: FREE T4: 1.47 ng/dL (ref 0.82–1.77)

## 2017-11-08 ENCOUNTER — Other Ambulatory Visit: Payer: Self-pay | Admitting: Physician Assistant

## 2017-11-08 DIAGNOSIS — M19072 Primary osteoarthritis, left ankle and foot: Principal | ICD-10-CM

## 2017-11-08 DIAGNOSIS — M19071 Primary osteoarthritis, right ankle and foot: Secondary | ICD-10-CM

## 2017-11-08 NOTE — Telephone Encounter (Signed)
meloxicam refill Last OV: 04/27/17 Last Refill: 09/06/17 Pharmacy: [facility] [place], [place]

## 2017-11-09 NOTE — Telephone Encounter (Signed)
Patient is requesting a refill of the following medications: Requested Prescriptions   Pending Prescriptions Disp Refills  . meloxicam (MOBIC) 15 MG tablet [Pharmacy Med Name: MELOXICAM 15 MG TABLET] 30 tablet 2    Sig: TAKE 1 TABLET BY MOUTH EVERY DAY    Date of patient request: 11/08/17 Last office visit: 04/27/17 Date of last refill: 09/06/17  Last refill amount: #30 2RF Follow up time period per chart: 05/09/18

## 2017-11-10 ENCOUNTER — Ambulatory Visit: Payer: Medicare Other | Admitting: Oncology

## 2017-11-10 ENCOUNTER — Other Ambulatory Visit: Payer: Medicare Other

## 2017-11-11 NOTE — Progress Notes (Signed)
Lab visit

## 2017-11-15 ENCOUNTER — Encounter: Payer: Self-pay | Admitting: Physician Assistant

## 2017-11-20 NOTE — Progress Notes (Signed)
[facility]  [phone] [phone]     ID: Jordan [name] DOB: 1951/07/19  MR#: [id number]  QPY#:[id number]  Patient Care Team: Jordan Mons, PA-C as PCP - General (Physician Assistant) Jordan Kussmaul, MD as Consulting Physician (General Surgery) Jordan [name], Jordan Dad, MD as Consulting Physician (Oncology) Jordan Koh, MD as Consulting Physician (Radiation Oncology) Jordan Bouche, NP as Nurse Practitioner (Nurse Practitioner) Jordan Cheese, NP as Nurse Practitioner (Nurse Practitioner) PCP: Jordan Mons, PA-C OTHER MD: Jordan Kato MD  CHIEF COMPLAINT: Early stage triple negative breast cancer  CURRENT TREATMENT: observation  BREAST CANCER HISTORY: From the original intake note:  "Jordan [name]" [facility] showing breast density category B. A possible mass in the right breast was noted and on Jordan [name] 20 03/05/2015 the patient underwent right diagnostic mammography with right ultrasonography. Spot compression views confirmed a slightly irregular mass in the lower inner quadrant of the right breast. This was not palpable by physical exam. Ultrasound showed a small hypoechoic mass measuring 5 mm in the area in question. Ultrasound of the right axilla was unremarkable.  Biopsy of the mass in question fibrin 05/06/2015 showed ([id number]) and invasive ductal carcinoma, grade 2, estrogen and progesterone receptor negative, with an MIB-1 of 41%, and no HER-2 amplification.  Bilateral breast MRI was obtained 10/04/2014. Results are pending.  The patient's subsequent history is as detailed below   INTERVAL HISTORY:  Jordan [name] returns today for follow-up of her estrogen receptor negative breast cancer. She notes that she is doing well overall.   She is still recovering from the deaths of her father, mother, and a brother last year and her sister moving to [name].  She had a diagnostic  bilateral mammogram completed on 09/09/17 at [facility] with results of: Breast density category B. No mammographic evidence of malignancy in either breast.   REVIEW OF SYSTEMS: Jordan [name] reports that for exercise, she walks and on most days she has between 4,000-5,000 steps that she completed by walking in stores. She has been pushing herself lately, she notes that her mother died the end of 2016-12-25. She then notes that in 02/24/2017 her oldest brother died from metastasized prostate cancer. She has been getting out of the house daily. She has a lack of energy and fatigue. She reports that she sleeps well at night. She has previously been tested for sleep apnea with a negative result. She denies unusual headaches, visual changes, nausea, vomiting, or dizziness. There has been no unusual cough, phlegm production, or pleurisy. This been no change in bowel or bladder habits. She denies unexplained fatigue or unexplained weight loss, bleeding, rash, or fever. A detailed review of systems was otherwise stable.     PAST MEDICAL HISTORY: Past Medical History:  Diagnosis Date  . Anemia   . Arthritis    oa left shoulder  . Back pain    buldging disc  . Blood clot of neck vein   . Breast cancer ([name]) 10/17/2014   lower inner quadrant of the right breast  . Cancer ([place])    colon  . Constipation   . Depression    takes Wellbutrin daily  . Dizziness    r/t side effects from meds  . Family history of adverse reaction to anesthesia    It is hard for my mother to wake up from anesthesia.  . Generalized headaches    due to allergies, sinus  .  GERD (gastroesophageal reflux disease)    takes Protonix as needed  . Hemorrhoids   . History of bronchitis    last time about 6-72yr ago  . History of colon polyps   . History of hiatal hernia   . History of migraine    last one about 15+yrs ago  . History of UTI   . Hx of seasonal allergies    takes OTC allergy med nightly  . Hyperlipidemia    takes  Zetia and Zocor daily  . Hypertension    takes Maxzide and Metoprolol daily  . Hypothyroidism    takes Synthroid daily  . Joint pain   . Leg swelling   . Mass of colon   . Nasal congestion   . Nausea   . Personal history of chemotherapy   . Personal history of radiation therapy   . Pneumonia    walking about 6-762yrago  . PONV (postoperative nausea and vomiting)    pt states she is very easy to sedate  . S/P radiation therapy 03/11/2015 through 04/25/2015    Right breast 4500 cGy in 25 sessions, right breast boost 1600 cGy in 8 sessions   . Thyroid disease   . Tuberculosis    as little girl    . Vertigo    takes Meclizine prn  . Wears glasses   . Wears glasses     PAST SURGICAL HISTORY: Past Surgical History:  Procedure Laterality Date  . ABDOMINAL HYSTERECTOMY    . APPENDECTOMY    . BREAST LUMPECTOMY Right 2016  . CARPAL TUNNEL RELEASE    . [facility] COLON[facility]    . ESOPHAGOGASTRODUODENOSCOPY    . EXPLORATORY LAPAROTOMY    . KNEE SURGERY  1998   right - arthroscopic  . PARTIAL COLECTOMY  03/30/2012   Procedure: PARTIAL COLECTOMY;  Surgeon: JaGwenyth OberMD;  [facility]: General;  Laterality: N/A;  . PORT-A-CATH REMOVAL N/A 06/25/2015   Procedure: REMOVAL PORT-A-CATH;  Surgeon: PaAutumn MessingII, MD;  [facility] Service: General;  Laterality: N/A;  . PORTACATH PLACEMENT    . RADIOACTIVE SEED GUIDED PARTIAL MASTECTOMY WITH AXILLARY SENTINEL LYMPH NODE BIOPSY Right 10/17/2014   Procedure: RADIOACTIVE SEED GUIDED PARTIAL MASTECTOMY WITH AXILLARY SENTINEL LYMPH NODE BIOPSY;  Surgeon: PaAutumn MessingII, MD;  [facility] Service: General;  Laterality: Right;  . TOTAL SHOULDER ARTHROPLASTY Left 03/04/2016   Procedure: LEFT TOTAL SHOULDER ARTHROPLASTY;  Surgeon: KeJustice BritainMD;  [facility]: Orthopedics;  Laterality: Left;     FAMILY HISTORY Family History  Problem Relation Age of Onset  . Deep vein thrombosis Mother   . Dementia Mother   . Cancer Father [id number]     prostate  . Dementia Father   . Cancer Brother [phone]     prostate, stage IV colon (2012)  . Cancer Maternal Aunt [id number]     breast  . Cancer Maternal Grandmother [id number]     breast  . Cancer Paternal Grandmother        colon  . Pulmonary embolism Brother        long-distance truck driver (PE x 2)  . Cancer Brother [id number]     prostate  . Cancer Brother        prostate  . Heart disease Brother   . Cancer Maternal Aunt [id number]  unknown type  . Mental illness Sister        history of Depression  . Seizures Sister 8       s/p traumatic brain injury  . Cancer Sister        lung cancer (NON SMOKER)  . Diabetes Maternal Grandfather   . Cancer Maternal Uncle        prostate  . Deep vein thrombosis Sister    the patient's parents are still living, as of February 2016. Her father is 78 years old, with a history of prostate cancer diagnosed in his late 15s. The patient's mother is 24 years old. The patient had 5 brothers, 3 sisters. One brother died with prostate cancer at age 27. Another brother has a history of prostate cancer, age 58. One brother had colon cancer diagnosed age 22 as did a paternal grandmother. A maternal grandmother had breast cancer at age 34 as did a maternal aunt (diagnosed age 56.) A cousin was diagnosed with breast cancer the age of 62.  GYNECOLOGIC HISTORY:  No LMP recorded. Patient has had a hysterectomy. Menarche age 13, first live birth age 24. The patient is GX P1. She stopped having periods in 1981 when she underwent hysterectomy and unilateral salpingo-oophorectomy.. She did not take hormone replacement.  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 Her husband [name] owns a business that [employer] at night. The patient's biological son [name] works in Biomedical scientist in [place]. He has 2 children. One of  them, the patient's granddaughter [name], 3 years old, lives with the patient, and is a Interior and spatial designer at [employer]. The patient also has an adopted son, [name], lives in [place]. Jordan [name] attends a city of refugee church in [place] where she grew up    ADVANCED DIRECTIVES: Not in place; at the 05/10/2016 visit the patient was given the appropriate forms to know her eyes and complete at her discretion. She tells me she intends to name her 42 year old granddaughter as her healthcare part of attorney  HEALTH MAINTENANCE: Social History   Tobacco Use  . Smoking status: Never Smoker  . Smokeless tobacco: Never Used  Substance Use Topics  . Alcohol use: Yes    Comment: RARELY  . Drug use: Yes    Types: Marijuana    Comment: last use 1 week ago 03/02/16     Colonoscopy: 03/30/2013/[facility]  PAP:  Bone density:  Lipid panel:  No Known Allergies  Current Outpatient Medications  Medication Sig Dispense Refill  . aspirin 81 MG tablet Take 81 mg by mouth daily.    Marland Kitchen buPROPion (WELLBUTRIN XL) 150 MG 24 hr tablet TAKE 1 TABLET BY MOUTH EVERY DAY 90 tablet 3  . diphenhydrAMINE (BENADRYL) 25 MG tablet Take 25 mg by mouth daily as needed for allergies.    Marland Kitchen levothyroxine (SYNTHROID, LEVOTHROID) 112 MCG tablet Take 1 tablet (112 mcg total) by mouth daily. 90 tablet 3  . meloxicam (MOBIC) 15 MG tablet TAKE 1 TABLET BY MOUTH EVERY DAY 30 tablet 2  . nitroGLYCERIN (NITROSTAT) 0.4 MG SL tablet Place 1 tablet (0.4 mg total) under the tongue every 5 (five) minutes as needed for chest pain. 30 tablet 1  . Prenatal Multivit-Min-Fe-FA (PRENATAL VITAMINS PO) Take 1 tablet by mouth daily.     . simvastatin (ZOCOR) 40 MG tablet Take 1 tablet (40 mg total) by mouth at bedtime. 30 tablet 5  . triamterene-hydrochlorothiazide (MAXZIDE-25) 37.5-25 MG tablet Take 1 tablet by mouth daily. 90 tablet  3   No current facility-administered medications for this visit.    Facility-Administered  Medications Ordered in Other Visits  Medication Dose Route Frequency Provider Last Rate Last Dose  . chlorhexidine (HIBICLENS) 4 % liquid 1 application  1 application Topical Once Jordan Kussmaul, MD        OBJECTIVE: Middle-aged African-American woman who appears stated age  29:   11/21/17 1221  BP: 119/81  Pulse: 79  Resp: 18  Temp: 98.4 F (36.9 C)  SpO2: 100%     Body mass index is 41.3 kg/m.    ECOG FS:1 - Symptomatic but completely ambulatory  Sclerae unicteric, EOMs intact Oropharynx clear and moist No cervical or supraclavicular adenopathy Lungs no rales or rhonchi Heart regular rate and rhythm Abd soft, nontender, positive bowel sounds MSK no focal spinal tenderness, no upper extremity lymphedema; using a cane Neuro: nonfocal, well oriented, appropriate affect Breasts: Right breast is status post lumpectomy and radiation.  It is smaller than the left.  There is no evidence of local recurrence.  The left breast is benign.  Both axillae are benign.  LAB RESULTS:  CMP     Component Value Date/Time   NA 147 (H) 11/02/2017 1428   NA 142 11/09/2016 1112   K 3.8 11/02/2017 1428   K 3.5 11/09/2016 1112   CL 107 (H) 11/02/2017 1428   CO2 20 11/02/2017 1428   CO2 23 11/09/2016 1112   GLUCOSE 88 11/02/2017 1428   GLUCOSE 112 11/09/2016 1112   BUN 13 11/02/2017 1428   BUN 12.1 11/09/2016 1112   CREATININE 0.83 11/02/2017 1428   CREATININE 1.0 11/09/2016 1112   CALCIUM 8.8 11/02/2017 1428   CALCIUM 9.0 11/09/2016 1112   PROT 6.7 11/02/2017 1428   PROT 6.6 11/09/2016 1112   ALBUMIN 4.0 11/02/2017 1428   ALBUMIN 3.4 (L) 11/09/2016 1112   AST 23 11/02/2017 1428   AST 19 11/09/2016 1112   ALT 18 11/02/2017 1428   ALT 18 11/09/2016 1112   ALKPHOS 67 11/02/2017 1428   ALKPHOS 68 11/09/2016 1112   BILITOT 0.7 11/02/2017 1428   BILITOT 0.85 11/09/2016 1112   GFRNONAA 72 11/02/2017 1428   GFRAA 83 11/02/2017 1428    INo results found for: SPEP, UPEP  Lab  Results  Component Value Date   WBC 8.2 11/02/2017   NEUTROABS 5.3 11/02/2017   HGB 14.4 11/02/2017   HCT 43.9 11/02/2017   MCV 85 11/02/2017   PLT 230 11/02/2017      Chemistry      Component Value Date/Time   NA 147 (H) 11/02/2017 1428   NA 142 11/09/2016 1112   K 3.8 11/02/2017 1428   K 3.5 11/09/2016 1112   CL 107 (H) 11/02/2017 1428   CO2 20 11/02/2017 1428   CO2 23 11/09/2016 1112   BUN 13 11/02/2017 1428   BUN 12.1 11/09/2016 1112   CREATININE 0.83 11/02/2017 1428   CREATININE 1.0 11/09/2016 1112      Component Value Date/Time   CALCIUM 8.8 11/02/2017 1428   CALCIUM 9.0 11/09/2016 1112   ALKPHOS 67 11/02/2017 1428   ALKPHOS 68 11/09/2016 1112   AST 23 11/02/2017 1428   AST 19 11/09/2016 1112   ALT 18 11/02/2017 1428   ALT 18 11/09/2016 1112   BILITOT 0.7 11/02/2017 1428   BILITOT 0.85 11/09/2016 1112       No results found for: LABCA2  No components found for: LABCA125  No results for input(s): INR in  the last 168 hours.  Urinalysis    Component Value Date/Time   COLORURINE YELLOW 03/09/2011 0857   APPEARANCEUR CLOUDY (A) 03/09/2011 0857   LABSPEC 1.020 11/10/2015 1152   PHURINE 6.0 11/10/2015 1152   PHURINE 5.5 03/09/2011 0857   GLUCOSEU Negative 11/10/2015 1152   HGBUR Negative 11/10/2015 1152   HGBUR NEGATIVE 03/09/2011 0857   BILIRUBINUR Negative 11/10/2015 1152   KETONESUR Negative 11/10/2015 1152   KETONESUR NEGATIVE 03/09/2011 0857   PROTEINUR Negative 11/10/2015 1152   PROTEINUR NEGATIVE 03/09/2011 0857   UROBILINOGEN 0.2 11/10/2015 1152   NITRITE Negative 11/10/2015 1152   NITRITE NEGATIVE 03/09/2011 0857   LEUKOCYTESUR Small 11/10/2015 1152    STUDIES: She had a diagnostic bilateral mammogram completed on 09/09/17 at [facility] with results of: Breast density category B. No mammographic evidence of malignancy in either breast.    ASSESSMENT: 67 y.o.  [place] woman status post right breast lower inner quadrant biopsy  09/24/2014 for a clinical T1a N0, stage IA invasive ductal carcinoma, grade 1 or 2, estrogen and progesterone receptor negative, with an MIB-1 of 41%, and HER-2 negative   (1) status post right lumpectomy and axillary sentinel lymph node sampling 10/17/2014 for a pT1b pN0, stage IA  invasive ductal carcinoma, grade 3, repeat HER-2 again negative.   (2) adjuvant chemotherapy started 12/09/2014, consisting of cyclophosphamide and docetaxel given every 3 weeks 4, completed 02/10/2015  (a) the docetaxel dose was decreased after the first cycle because of elevations in transaminases.   (3) adjuvant radiation started 03/18/2015  (4) status post partial right colectomy with lymphadenectomy 03/30/2012 for a 2.7 cm tubular adenoma with high-grade dysplasia, no evidence of invasion, 0 of 11 lymph nodes involved (SZA [phone])  (5) genetics testing sent 10/04/2014 through the [facility] gene panel /[facility] found no deleterious mutations in ATM, BARD1, BRCA1, BRCA2, BRIP1, CDH1, CHEK2, EPCAM, MLH1, MRE11A, MSH2, MSH6, MUTYH, NBN, NF1, PALB2, PMS2, PTEN, RAD50, RAD51C, RAD51D, SMARCA4, STK11, or TP53  (a) two variants of uncertain significance were found    (i) ATM, p.[id number]   (ii) SMARCA4, p.[id number]  (6) left IJ DVT 12/02/14, started on xarelto  (a) left upper extremity Doppler ultrasonography 03/28/2015 was negative: also shows left IJ clear  (b) xarelto discontinued September 2016  (7) elevated blood glucose. A1c on 12/03/14 was 6.4.  PLAN: Jordan [name] is now 3 years out from definitive surgery for her breast cancer with no evidence of disease recurrence.  This is very favorable.  She will have more energy the more activity she does.  I am delighted that she is trying to walk on a regular basis including on days before the weather is bad.  I commended her for that  Last year was very difficult for her.  I am hoping this will be a much better 1.  From a breast cancer point of view she will see me 2 more  times and in 2 years she will "graduate".  She knows to call for any problems that may develop before the next visit.  Richard Ritchey, Jordan Dad, MD  11/21/17 12:37 PM Medical Oncology and Hematology [facility] [address] [place], [place] [address] Tel. [phone]    Fax. [phone]    This document serves as a record of services personally performed by Lurline Del, MD. It was created on his behalf by Steva Colder, a trained medical scribe. The creation of this record is based on the scribe's personal observations and the provider's statements to them.  I have reviewed the above documentation for accuracy and completeness, and I agree with the above.

## 2017-11-21 ENCOUNTER — Encounter: Payer: Self-pay | Admitting: Physician Assistant

## 2017-11-21 ENCOUNTER — Telehealth: Payer: Self-pay | Admitting: Oncology

## 2017-11-21 ENCOUNTER — Inpatient Hospital Stay: Payer: Medicare Other | Attending: Oncology

## 2017-11-21 ENCOUNTER — Inpatient Hospital Stay (HOSPITAL_BASED_OUTPATIENT_CLINIC_OR_DEPARTMENT_OTHER): Payer: Medicare Other | Admitting: Oncology

## 2017-11-21 VITALS — BP 119/81 | HR 79 | Temp 98.4°F | Resp 18 | Ht 61.0 in | Wt 218.6 lb

## 2017-11-21 DIAGNOSIS — Z9221 Personal history of antineoplastic chemotherapy: Secondary | ICD-10-CM

## 2017-11-21 DIAGNOSIS — Z923 Personal history of irradiation: Secondary | ICD-10-CM | POA: Insufficient documentation

## 2017-11-21 DIAGNOSIS — C50311 Malignant neoplasm of lower-inner quadrant of right female breast: Secondary | ICD-10-CM | POA: Diagnosis not present

## 2017-11-21 DIAGNOSIS — Z7982 Long term (current) use of aspirin: Secondary | ICD-10-CM | POA: Diagnosis not present

## 2017-11-21 DIAGNOSIS — E785 Hyperlipidemia, unspecified: Secondary | ICD-10-CM | POA: Diagnosis not present

## 2017-11-21 DIAGNOSIS — Z79899 Other long term (current) drug therapy: Secondary | ICD-10-CM | POA: Diagnosis not present

## 2017-11-21 DIAGNOSIS — F329 Major depressive disorder, single episode, unspecified: Secondary | ICD-10-CM | POA: Insufficient documentation

## 2017-11-21 DIAGNOSIS — C19 Malignant neoplasm of rectosigmoid junction: Secondary | ICD-10-CM

## 2017-11-21 DIAGNOSIS — Z86718 Personal history of other venous thrombosis and embolism: Secondary | ICD-10-CM

## 2017-11-21 DIAGNOSIS — I10 Essential (primary) hypertension: Secondary | ICD-10-CM | POA: Insufficient documentation

## 2017-11-21 DIAGNOSIS — Z171 Estrogen receptor negative status [ER-]: Secondary | ICD-10-CM | POA: Diagnosis not present

## 2017-11-21 DIAGNOSIS — Z87440 Personal history of urinary (tract) infections: Secondary | ICD-10-CM | POA: Diagnosis not present

## 2017-11-21 DIAGNOSIS — E039 Hypothyroidism, unspecified: Secondary | ICD-10-CM | POA: Insufficient documentation

## 2017-11-21 DIAGNOSIS — Z86010 Personal history of colonic polyps: Secondary | ICD-10-CM | POA: Diagnosis not present

## 2017-11-21 DIAGNOSIS — K219 Gastro-esophageal reflux disease without esophagitis: Secondary | ICD-10-CM | POA: Insufficient documentation

## 2017-11-21 DIAGNOSIS — Z6841 Body Mass Index (BMI) 40.0 and over, adult: Secondary | ICD-10-CM

## 2017-11-21 LAB — COMPREHENSIVE METABOLIC PANEL
ALT: 18 U/L (ref 0–55)
AST: 22 U/L (ref 5–34)
Albumin: 3.5 g/dL (ref 3.5–5.0)
Alkaline Phosphatase: 70 U/L (ref 40–150)
Anion gap: 8 (ref 3–11)
BILIRUBIN TOTAL: 0.8 mg/dL (ref 0.2–1.2)
BUN: 14 mg/dL (ref 7–26)
CO2: 27 mmol/L (ref 22–29)
CREATININE: 0.96 mg/dL (ref 0.60–1.10)
Calcium: 9.5 mg/dL (ref 8.4–10.4)
Chloride: 108 mmol/L (ref 98–109)
GFR, EST NON AFRICAN AMERICAN: 59 mL/min — AB (ref >60)
Glucose, Bld: 92 mg/dL (ref 70–140)
POTASSIUM: 3.8 mmol/L (ref 3.5–5.1)
Sodium: 143 mmol/L (ref 136–145)
TOTAL PROTEIN: 7 g/dL (ref 6.4–8.3)

## 2017-11-21 NOTE — Telephone Encounter (Signed)
Gave patient AVs and calendar of upcoming march 2020 appointments.  °

## 2017-12-08 ENCOUNTER — Telehealth: Payer: Self-pay | Admitting: Physician Assistant

## 2017-12-08 NOTE — Telephone Encounter (Signed)
Called pt and left VM advised them that Chelle was leaving the practice. Offered them to call the office and make an appt with one of our other providers that is accepting new pts. If pt calls back, please schedule them with one of the following providers that are accepting new pts. : Clark, Stallings, Sagardia, Santiago, Wiseman, McVey or Mani. ° °Thanks! °

## 2017-12-25 ENCOUNTER — Other Ambulatory Visit: Payer: Self-pay | Admitting: Physician Assistant

## 2017-12-25 DIAGNOSIS — E785 Hyperlipidemia, unspecified: Secondary | ICD-10-CM

## 2017-12-25 DIAGNOSIS — I10 Essential (primary) hypertension: Secondary | ICD-10-CM

## 2017-12-28 ENCOUNTER — Other Ambulatory Visit: Payer: Self-pay | Admitting: Physician Assistant

## 2017-12-28 DIAGNOSIS — E039 Hypothyroidism, unspecified: Secondary | ICD-10-CM

## 2018-02-06 ENCOUNTER — Other Ambulatory Visit: Payer: Self-pay | Admitting: Physician Assistant

## 2018-02-06 DIAGNOSIS — F32A Depression, unspecified: Secondary | ICD-10-CM

## 2018-02-06 DIAGNOSIS — F329 Major depressive disorder, single episode, unspecified: Secondary | ICD-10-CM

## 2018-02-06 NOTE — Telephone Encounter (Signed)
Patient is requesting a refill of the following medications: Requested Prescriptions   Pending Prescriptions Disp Refills  . buPROPion (WELLBUTRIN XL) 150 MG 24 hr tablet [Pharmacy Med Name: BUPROPION HCL XL 150 MG TABLET] 90 tablet 3    Sig: TAKE 1 TABLET BY MOUTH EVERY DAY    Date of patient request: 02/06/18 Last office visit: 11/02/17 Date of last refill: 03/12/17 Last refill amount: #90 3RF Follow up time period per chart: cnclld 05/09/18 by [employer]

## 2018-02-20 LAB — HM COLONOSCOPY

## 2018-03-01 DIAGNOSIS — D72829 Elevated white blood cell count, unspecified: Secondary | ICD-10-CM | POA: Insufficient documentation

## 2018-03-01 DIAGNOSIS — I878 Other specified disorders of veins: Secondary | ICD-10-CM | POA: Insufficient documentation

## 2018-03-09 ENCOUNTER — Encounter: Payer: Self-pay | Admitting: *Deleted

## 2018-05-09 ENCOUNTER — Ambulatory Visit: Payer: Medicare Other | Admitting: Physician Assistant

## 2018-08-17 ENCOUNTER — Other Ambulatory Visit: Payer: Self-pay | Admitting: Oncology

## 2018-08-17 DIAGNOSIS — Z9889 Other specified postprocedural states: Secondary | ICD-10-CM

## 2018-09-12 ENCOUNTER — Ambulatory Visit
Admission: RE | Admit: 2018-09-12 | Discharge: 2018-09-12 | Disposition: A | Discharge status: Home / Self Care | Payer: Medicare Other | Source: Ambulatory Visit | Attending: Oncology | Admitting: Oncology

## 2018-09-12 DIAGNOSIS — Z9889 Other specified postprocedural states: Secondary | ICD-10-CM

## 2018-10-30 NOTE — Progress Notes (Signed)
No show

## 2018-10-31 ENCOUNTER — Inpatient Hospital Stay: Payer: Medicare Other | Admitting: Oncology

## 2018-10-31 ENCOUNTER — Encounter: Payer: Self-pay | Admitting: Oncology

## 2018-10-31 ENCOUNTER — Inpatient Hospital Stay: Payer: Medicare Other | Attending: Physician Assistant

## 2018-11-01 DIAGNOSIS — M2550 Pain in unspecified joint: Secondary | ICD-10-CM | POA: Insufficient documentation

## 2019-08-21 ENCOUNTER — Other Ambulatory Visit: Payer: Self-pay | Admitting: Oncology

## 2019-08-21 DIAGNOSIS — Z9889 Other specified postprocedural states: Secondary | ICD-10-CM

## 2019-08-21 DIAGNOSIS — Z853 Personal history of malignant neoplasm of breast: Secondary | ICD-10-CM

## 2019-09-04 DIAGNOSIS — R079 Chest pain, unspecified: Secondary | ICD-10-CM | POA: Insufficient documentation

## 2019-09-14 ENCOUNTER — Ambulatory Visit
Admission: RE | Admit: 2019-09-14 | Discharge: 2019-09-14 | Disposition: A | Discharge status: Home / Self Care | Payer: Medicare PPO | Source: Ambulatory Visit | Attending: Oncology | Admitting: Oncology

## 2019-09-14 ENCOUNTER — Other Ambulatory Visit: Payer: Self-pay

## 2019-09-14 DIAGNOSIS — Z9889 Other specified postprocedural states: Secondary | ICD-10-CM

## 2019-09-14 DIAGNOSIS — Z853 Personal history of malignant neoplasm of breast: Secondary | ICD-10-CM

## 2020-04-25 DIAGNOSIS — M51369 Other intervertebral disc degeneration, lumbar region without mention of lumbar back pain or lower extremity pain: Secondary | ICD-10-CM | POA: Insufficient documentation

## 2020-04-25 DIAGNOSIS — M5136 Other intervertebral disc degeneration, lumbar region: Secondary | ICD-10-CM | POA: Insufficient documentation

## 2020-08-21 ENCOUNTER — Other Ambulatory Visit: Payer: Self-pay | Admitting: Oncology

## 2020-08-21 DIAGNOSIS — Z1231 Encounter for screening mammogram for malignant neoplasm of breast: Secondary | ICD-10-CM

## 2020-10-01 ENCOUNTER — Ambulatory Visit
Admission: RE | Admit: 2020-10-01 | Discharge: 2020-10-01 | Disposition: A | Discharge status: Home / Self Care | Payer: Medicare PPO | Source: Ambulatory Visit | Attending: Oncology | Admitting: Oncology

## 2020-10-01 ENCOUNTER — Other Ambulatory Visit: Payer: Self-pay

## 2020-10-01 DIAGNOSIS — Z1231 Encounter for screening mammogram for malignant neoplasm of breast: Secondary | ICD-10-CM

## 2021-04-03 ENCOUNTER — Other Ambulatory Visit: Payer: Self-pay | Admitting: Physician Assistant

## 2021-04-03 DIAGNOSIS — N6310 Unspecified lump in the right breast, unspecified quadrant: Secondary | ICD-10-CM

## 2021-04-11 ENCOUNTER — Ambulatory Visit
Admission: RE | Admit: 2021-04-11 | Discharge: 2021-04-11 | Disposition: A | Discharge status: Home / Self Care | Payer: Medicare PPO | Source: Ambulatory Visit | Attending: Physician Assistant | Admitting: Physician Assistant

## 2021-04-11 DIAGNOSIS — N6310 Unspecified lump in the right breast, unspecified quadrant: Secondary | ICD-10-CM

## 2021-08-28 ENCOUNTER — Other Ambulatory Visit: Payer: Self-pay | Admitting: Physician Assistant

## 2021-08-28 DIAGNOSIS — Z1231 Encounter for screening mammogram for malignant neoplasm of breast: Secondary | ICD-10-CM

## 2021-10-13 ENCOUNTER — Ambulatory Visit
Admission: RE | Admit: 2021-10-13 | Discharge: 2021-10-13 | Disposition: A | Discharge status: Home / Self Care | Payer: Medicare PPO | Source: Ambulatory Visit | Attending: Physician Assistant | Admitting: Physician Assistant

## 2021-10-13 DIAGNOSIS — Z1231 Encounter for screening mammogram for malignant neoplasm of breast: Secondary | ICD-10-CM

## 2022-07-27 ENCOUNTER — Ambulatory Visit: Payer: Medicare PPO | Admitting: Vascular Surgery

## 2022-07-27 ENCOUNTER — Encounter: Payer: Self-pay | Admitting: Vascular Surgery

## 2022-07-27 VITALS — BP 138/76 | HR 83 | Temp 99.2°F | Resp 20 | Ht 61.0 in | Wt 222.0 lb

## 2022-07-27 DIAGNOSIS — R6889 Other general symptoms and signs: Secondary | ICD-10-CM | POA: Diagnosis not present

## 2022-07-27 NOTE — Progress Notes (Signed)
[facility] [place]  ASSESSMENT / PLAN: 71 y.o. female with no evidence of hemodynamically significant peripheral arterial disease on clinical exam today.  Noninvasive testing not yet performed.  Will do this on an elective basis in the coming weeks.  I will call her with the results.  I have very low suspicion of hemodynamically significant arterial disease.  CHIEF COMPLAINT: Abnormal screening test  HISTORY OF PRESENT ILLNESS: Jordan [name] is a 71 y.o. female referred to clinic for evaluation of abnormal home health peripheral arterial disease screening test.  The patient reports fairly classic symptoms of radicular discomfort in the left lower extremity.  A home health nurse came to her house to perform some screening tests and performed what sounds like a bedside ankle-brachial and toe brachial index.  This was resulted as abnormal.  The patient has no symptoms of claudication.  She does not have ischemic rest pain.  She has no ulcers about her feet.  Past Medical History:  Diagnosis Date   Anemia    Arthritis    oa left shoulder   Back pain    buldging disc   Blood clot of neck vein    Breast cancer ([place]) 10/17/2014   lower inner quadrant of the right breast   Cancer ([place])    colon   Constipation    Depression    takes Wellbutrin daily   Dizziness    r/t side effects from meds   Family history of adverse reaction to anesthesia    It is hard for my mother to wake up from anesthesia.   Generalized headaches    due to allergies, sinus   GERD (gastroesophageal reflux disease)    takes Protonix as needed   Hemorrhoids    History of bronchitis    last time about 6-81yr ago   History of colon polyps    History of hiatal hernia    History of migraine    last one about 15+yrs ago   History of UTI    Hx of seasonal allergies    takes OTC allergy med nightly   Hyperlipidemia    takes Zetia and Zocor daily   Hypertension    takes Maxzide and  Metoprolol daily   Hypothyroidism    takes Synthroid daily   Joint pain    Leg swelling    Mass of colon    Nasal congestion    Nausea    Personal history of chemotherapy    Personal history of radiation therapy    Pneumonia    walking about 6-776yrago   PONV (postoperative nausea and vomiting)    pt states she is very easy to sedate   S/P radiation therapy 03/11/2015 through 04/25/2015                                                      Right breast 4500 cGy in 25 sessions, right breast boost 1600 cGy in 8 sessions                         Thyroid disease    Tuberculosis    as little girl     Vertigo    takes Meclizine prn   Wears glasses    Wears glasses     Past  Surgical History:  Procedure Laterality Date   ABDOMINAL HYSTERECTOMY     APPENDECTOMY     BREAST LUMPECTOMY Right 2016   CARPAL TUNNEL RELEASE     CESAREAN SECTION  1968, 1970   COLONOSCOPY     ESOPHAGOGASTRODUODENOSCOPY     EXPLORATORY LAPAROTOMY     KNEE SURGERY  1998   right - arthroscopic   PARTIAL COLECTOMY  03/30/2012   Procedure: PARTIAL COLECTOMY;  Surgeon: [name], MD;  Location: [facility];  Service: General;  Laterality: N/A;   PORT-A-CATH REMOVAL N/A 06/25/2015   Procedure: REMOVAL PORT-A-CATH;  Surgeon: [name], MD;  Location: [facility];  Service: General;  Laterality: N/A;   PORTACATH PLACEMENT     RADIOACTIVE SEED GUIDED PARTIAL MASTECTOMY WITH AXILLARY SENTINEL LYMPH NODE BIOPSY Right 10/17/2014   Procedure: RADIOACTIVE SEED GUIDED PARTIAL MASTECTOMY WITH AXILLARY SENTINEL LYMPH NODE BIOPSY;  Surgeon: [name], MD;  Location: [facility];  Service: General;  Laterality: Right;   TOTAL SHOULDER ARTHROPLASTY Left 03/04/2016   Procedure: LEFT TOTAL SHOULDER ARTHROPLASTY;  Surgeon: [name], MD;  Location: [facility];  Service: Orthopedics;  Laterality: Left;    Family History  Problem Relation Age of Onset   Deep vein thrombosis Mother    Dementia Mother    Cancer Father 69        prostate   Dementia Father    Cancer Brother 66       prostate, stage IV colon (2012)   Cancer Maternal Aunt 48       breast   Cancer Maternal Grandmother 53       breast   Cancer Paternal Grandmother        colon   Pulmonary embolism Brother        long-distance truck driver (PE x 2)   Cancer Brother 58       prostate   Cancer Brother        prostate   Heart disease Brother    Cancer Maternal Aunt 75       unknown type   Mental illness Sister        history of Depression   Seizures Sister 8       s/p traumatic brain injury   Cancer Sister        lung cancer (NON SMOKER)   Diabetes Maternal Grandfather    Cancer Maternal Uncle        prostate   Deep vein thrombosis Sister     Social History   Socioeconomic History   Marital status: Legally Separated    Spouse name: Patrick Jupiter   Number of children: 1   Years of education: 16   Highest education level: Not on file  Occupational History   Occupation: retired Pharmacist, hospital  Tobacco Use   Smoking status: Never   Smokeless tobacco: Never  Substance and Sexual Activity   Alcohol use: Yes    Comment: RARELY   Drug use: Yes    Types: Marijuana    Comment: last use 1 week ago 03/02/16   Sexual activity: Yes    Partners: Male    Birth control/protection: Surgical  Other Topics Concern   Not on file  Social History Narrative   Lives with her husband and her granddaughter, who she has raised as her own.  Marriage has been unhappy for a long time, and planned to leave when her granddaughter left for college.  The granddaughter currently studying make-up at a community college  and may pursue cosmetology in the future.    Cares for her mother, who has dementia, which has further delayed her plans to leave her husband.   Social Determinants of Health   Financial Resource Strain: Not on file  Food Insecurity: Not on file  Transportation Needs: Not on file  Physical Activity: Not on file  Stress: Not on file  Social  Connections: Not on file  Intimate Partner Violence: Not on file    No Known Allergies  Current Outpatient Medications  Medication Sig Dispense Refill   aspirin 81 MG tablet Take 81 mg by mouth daily.     atorvastatin (LIPITOR) 40 MG tablet Take by mouth.     buPROPion (WELLBUTRIN XL) 150 MG 24 hr tablet Take 1 tablet (150 mg total) by mouth daily. Further refills will need an office visit as Daphane Shepherd is no longer with this practice. 90 tablet 0   colchicine 0.6 MG tablet TAKE 2TABS AT FIRST SIGN OF GOUT, THEN 1TAB 1 HR LATER. TAKE 1TAB TWICE A DAY STARTING THE NEXT DAY     diphenhydrAMINE (BENADRYL) 25 MG tablet Take 25 mg by mouth daily as needed for allergies.     diphenhydrAMINE (SOMINEX) 25 MG tablet Take by mouth.     levothyroxine (SYNTHROID, LEVOTHROID) 112 MCG tablet TAKE 1 TABLET BY MOUTH EVERY DAY 90 tablet 0   meloxicam (MOBIC) 15 MG tablet TAKE 1 TABLET BY MOUTH EVERY DAY 30 tablet 2   nitroGLYCERIN (NITROSTAT) 0.4 MG SL tablet Place 1 tablet (0.4 mg total) under the tongue every 5 (five) minutes as needed for chest pain. 30 tablet 1   Prenatal Multivit-Min-Fe-FA (PRENATAL VITAMINS PO) Take 1 tablet by mouth daily.      simvastatin (ZOCOR) 40 MG tablet Take 1 tablet (40 mg total) by mouth at bedtime. 30 tablet 5   simvastatin (ZOCOR) 40 MG tablet TAKE 1 TABLET BY MOUTH EVERYDAY AT BEDTIME 30 tablet 2   triamterene-hydrochlorothiazide (MAXZIDE-25) 37.5-25 MG tablet TAKE 1 TABLET BY MOUTH DAILY. 90 tablet 3   No current facility-administered medications for this visit.   Facility-Administered Medications Ordered in Other Visits  Medication Dose Route Frequency Provider Last Rate Last Admin   chlorhexidine (HIBICLENS) 4 % liquid 1 application  1 application  Topical Once Jovita Kussmaul, MD        PHYSICAL EXAM Vitals:   07/27/22 1138  BP: 138/76  Pulse: 83  Resp: 20  Temp: 99.2 F (37.3 C)  SpO2: 98%  Weight: 222 lb (100.7 kg)  Height: '5\' 1"'$  (1.549 m)    Elderly woman in no acute distress Regular rate and rhythm Unlabored breathing Weakly palpable posterior tibial pulses bilaterally Triphasic Doppler flow in the dorsalis pedis and posterior tibial arteries bilaterally  PERTINENT LABORATORY AND RADIOLOGIC DATA  Most recent CBC    Latest Ref Rng & Units 11/02/2017    2:28 PM 04/27/2017    3:52 PM 11/09/2016   11:12 AM  CBC  WBC 3.4 - 10.8 x10E3/uL 8.2  7.8  7.6   Hemoglobin 11.1 - 15.9 g/dL 14.4  15.1  14.5   Hematocrit 34.0 - 46.6 % 43.9  47.4  44.4   Platelets 150 - 379 x10E3/uL 230  264  242      Most recent CMP    Latest Ref Rng & Units 11/21/2017   11:58 AM 11/02/2017    2:28 PM 04/27/2017    3:52 PM  CMP  Glucose 70 - 140 mg/dL 92  88  90   BUN 7 - 26 mg/dL '14  13  15   '$ Creatinine 0.60 - 1.10 mg/dL 0.96  0.83  0.88   Sodium 136 - 145 mmol/L 143  147  148   Potassium 3.5 - 5.1 mmol/L 3.8  3.8  3.6   Chloride 98 - 109 mmol/L 108  107  108   CO2 22 - 29 mmol/L '27  20  22   '$ Calcium 8.4 - 10.4 mg/dL 9.5  8.8  9.3   Total Protein 6.4 - 8.3 g/dL 7.0  6.7  6.9   Total Bilirubin 0.2 - 1.2 mg/dL 0.8  0.7  0.6   Alkaline Phos 40 - 150 U/L 70  67  74   AST 5 - 34 U/L '22  23  24   '$ ALT 0 - 55 U/L '18  18  17     '$ Renal function CrCl cannot be calculated (Patient's most recent lab result is older than the maximum 21 days allowed.).  Hgb A1c MFr Bld (%)  Date Value  11/02/2017 5.9 (H)    LDL Calculated  Date Value Ref Range Status  11/02/2017 101 (H) 0 - 99 mg/dL Final    Yevonne Aline. Stanford Breed, MD FACS [facility] Phone Number: [phone] 07/27/2022 1:22 PM   Total time spent on preparing this encounter including chart review, data review, collecting history, examining the patient, coordinating care for this new patient, 45 minutes.  Portions of this report may have been transcribed using voice recognition software.  Every effort has been made to ensure accuracy; however, inadvertent  computerized transcription errors may still be present.

## 2022-07-28 ENCOUNTER — Other Ambulatory Visit: Payer: Self-pay | Admitting: *Deleted

## 2022-07-28 DIAGNOSIS — R6889 Other general symptoms and signs: Secondary | ICD-10-CM

## 2022-08-02 ENCOUNTER — Ambulatory Visit (HOSPITAL_COMMUNITY)
Admission: RE | Admit: 2022-08-02 | Discharge: 2022-08-02 | Disposition: A | Discharge status: Home / Self Care | Payer: Medicare PPO | Source: Ambulatory Visit | Attending: Surgery | Admitting: Surgery

## 2022-08-02 ENCOUNTER — Encounter (HOSPITAL_COMMUNITY): Payer: Self-pay

## 2022-08-02 ENCOUNTER — Ambulatory Visit (HOSPITAL_COMMUNITY): Admission: RE | Admit: 2022-08-02 | Payer: Medicare PPO | Source: Ambulatory Visit

## 2022-08-02 DIAGNOSIS — R6889 Other general symptoms and signs: Secondary | ICD-10-CM | POA: Diagnosis present

## 2022-08-03 ENCOUNTER — Ambulatory Visit (INDEPENDENT_AMBULATORY_CARE_PROVIDER_SITE_OTHER): Payer: Medicare PPO | Admitting: Vascular Surgery

## 2022-08-03 DIAGNOSIS — R6889 Other general symptoms and signs: Secondary | ICD-10-CM

## 2022-08-03 NOTE — Progress Notes (Signed)
Reviewed test results with patient. Normal ABI / TBI. No evidence of PAD. F/U PRN.  Jordan [name]. Stanford Breed, MD [facility] Phone Number: [phone] 08/03/2022 2:51 PM

## 2022-09-10 ENCOUNTER — Other Ambulatory Visit: Payer: Self-pay | Admitting: Physician Assistant

## 2022-09-10 DIAGNOSIS — Z1231 Encounter for screening mammogram for malignant neoplasm of breast: Secondary | ICD-10-CM

## 2022-11-02 ENCOUNTER — Ambulatory Visit: Payer: Medicare PPO

## 2022-12-16 ENCOUNTER — Ambulatory Visit
Admission: RE | Admit: 2022-12-16 | Discharge: 2022-12-16 | Disposition: A | Discharge status: Home / Self Care | Payer: Medicare PPO | Source: Ambulatory Visit | Attending: Physician Assistant | Admitting: Physician Assistant

## 2022-12-16 DIAGNOSIS — Z1231 Encounter for screening mammogram for malignant neoplasm of breast: Secondary | ICD-10-CM

## 2023-01-27 ENCOUNTER — Ambulatory Visit: Payer: Medicare PPO | Admitting: Family

## 2023-01-27 VITALS — BP 122/76 | HR 86 | Temp 97.6°F | Resp 20 | Ht 61.0 in | Wt 220.4 lb

## 2023-01-27 DIAGNOSIS — E661 Drug-induced obesity: Secondary | ICD-10-CM

## 2023-01-27 DIAGNOSIS — R7303 Prediabetes: Secondary | ICD-10-CM

## 2023-01-27 DIAGNOSIS — M5136 Other intervertebral disc degeneration, lumbar region: Secondary | ICD-10-CM

## 2023-01-27 DIAGNOSIS — Z1231 Encounter for screening mammogram for malignant neoplasm of breast: Secondary | ICD-10-CM | POA: Diagnosis not present

## 2023-01-27 DIAGNOSIS — I10 Essential (primary) hypertension: Secondary | ICD-10-CM | POA: Diagnosis not present

## 2023-01-27 DIAGNOSIS — E039 Hypothyroidism, unspecified: Secondary | ICD-10-CM

## 2023-01-27 DIAGNOSIS — E782 Mixed hyperlipidemia: Secondary | ICD-10-CM | POA: Diagnosis not present

## 2023-01-27 DIAGNOSIS — Z6841 Body Mass Index (BMI) 40.0 and over, adult: Secondary | ICD-10-CM

## 2023-01-27 DIAGNOSIS — F331 Major depressive disorder, recurrent, moderate: Secondary | ICD-10-CM

## 2023-01-27 DIAGNOSIS — Z8739 Personal history of other diseases of the musculoskeletal system and connective tissue: Secondary | ICD-10-CM

## 2023-01-27 DIAGNOSIS — Z7689 Persons encountering health services in other specified circumstances: Secondary | ICD-10-CM

## 2023-01-27 MED ORDER — COLCHICINE 0.6 MG PO TABS: ORAL_TABLET | ORAL | 1 refills | Status: AC

## 2023-01-27 MED ORDER — ATORVASTATIN CALCIUM 40 MG PO TABS: 40.0000 mg | ORAL_TABLET | Freq: Every day | ORAL | 1 refills | Status: DC

## 2023-01-27 MED ORDER — NITROGLYCERIN 0.4 MG SL SUBL: 0.4000 mg | SUBLINGUAL_TABLET | SUBLINGUAL | 1 refills | Status: AC | PRN

## 2023-01-27 MED ORDER — LEVOTHYROXINE SODIUM 112 MCG PO TABS: 112.0000 ug | ORAL_TABLET | Freq: Every day | ORAL | 1 refills | Status: DC

## 2023-01-27 MED ORDER — BUPROPION HCL ER (XL) 150 MG PO TB24: 150.0000 mg | ORAL_TABLET | Freq: Every day | ORAL | 0 refills | Status: DC

## 2023-01-27 MED ORDER — GABAPENTIN 100 MG PO CAPS: 200.0000 mg | ORAL_CAPSULE | Freq: Every day | ORAL | 1 refills | Status: DC

## 2023-01-27 MED ORDER — TRIAMTERENE-HCTZ 37.5-25 MG PO TABS: 1.0000 | ORAL_TABLET | Freq: Every day | ORAL | 3 refills | Status: AC

## 2023-01-27 NOTE — Progress Notes (Signed)
Provider: Tatianna Ibbotson FNP-C   Porfirio Oar, PA  Patient Care Team: Porfirio Oar, [place] as PCP - General (Family Medicine) Griselda Miner, MD as Consulting Physician (General Surgery) Magrinat, Valentino Hue, MD (Inactive) as Consulting Physician (Oncology) Chipper Herb, MD (Inactive) as Consulting Physician (Radiation Oncology) Hubbard Hartshorn, NP (Inactive) as Nurse Practitioner (Nurse Practitioner) Salomon Fick, NP as Nurse Practitioner (Nurse Practitioner) Charna Elizabeth, MD as Consulting Physician (Gastroenterology) [facility], [place]  Extended Emergency Contact Information Primary Emergency Contact: Moors,Midasia Address: [address]          [place], [place] [address] [place] of [place] Home Phone: [phone] Mobile Phone: [phone] Relation: Granddaughter  Code Status:  Full Code  Goals of care: Advanced Directive information    01/27/2023   10:42 AM  Advanced Directives  Does Patient Have a Medical Advance Directive? Yes  Does patient want to make changes to medical advance directive? No - Patient declined  Would patient like information on creating a medical advance directive? Yes (Inpatient - patient defers creating a medical advance directive at this time - Information given)     No chief complaint on file.   HPI:  Pt is a 72 y.o. female seen today establish care here at [facility] for medical management of chronic diseases.  Hypertension - does not check B/p at home.has had some dizziness. Worst with standing too fast.Takes Maxzide at night.She denies any headache,vision changes,fatigue,chest tightness,palpitation,chest pain or shortness of breath.     Hyperlipidemia - on Lipitor has increased leafy vegetables in the diet.eats meat.Does exercise by walking and going up and down the steps in the house.   Hx of breast cancer  - request referral for mammogram felt small swelling on right  upper breast not painful.   GERD - symptoms stable.not on medication.   DDD lumbar - Pain radiates to left leg.No numbness or tingling but feels weaker.Had exercises which helped.she was treated with cortisol injection in the past but was painful will never use it again due to pain.   Osteoarthritis - joint stiffness and swelling.main on hands,knees,ankle and feet.OTC Tylenol has been effective.   Hx of colon cancer - Had partial colectomy in 2013.Has had some melana but stopped Asa and other supplement.which cleared up. Has upcoming appointment with GI 02/08/2023.  Hypothyroidism - on levothyroxine 112 mcg tablet takes first thing in the morning.   Depression - on Wellbutrin has lost several family members and relatives.  Prediabetes - latest A1c 5.7 states tries to walk and goes up and down the steps in the house.   Hx gout - has not had flare up since fall 2023 on right middle finger.  Due for Tdap,COVID-19 and Shingles vaccine.made aware to get vaccine at the pharmacy.    Past Medical History:  Diagnosis Date   Anemia    Arthritis    oa left shoulder   Back pain    buldging disc   Blood clot of neck vein    Breast cancer (HCC) 10/17/2014   lower inner quadrant of the right breast   Cancer (HCC)    colon   Constipation    Depression    takes Wellbutrin daily   Dizziness    r/t side effects from meds   Family history of adverse reaction to anesthesia    It is hard for my mother to wake up from anesthesia.   Generalized headaches    due to allergies,  sinus   GERD (gastroesophageal reflux disease)    takes Protonix as needed   Hemorrhoids    History of bronchitis    last time about 6-73yrs ago   History of colon polyps    History of hiatal hernia    History of migraine    last one about 15+yrs ago   History of UTI    Hx of seasonal allergies    takes OTC allergy med nightly   Hyperlipidemia    takes Zetia and Zocor daily   Hypertension    takes Maxzide and  Metoprolol daily   Hypothyroidism    takes Synthroid daily   Joint pain    Leg swelling    Mass of colon    Nasal congestion    Nausea    Personal history of chemotherapy    Personal history of radiation therapy    Pneumonia    walking about 6-60yrs ago   PONV (postoperative nausea and vomiting)    pt states she is very easy to sedate   S/P radiation therapy 03/11/2015 through 04/25/2015                                                      Right breast 4500 cGy in 25 sessions, right breast boost 1600 cGy in 8 sessions                         Thyroid disease    Tuberculosis    as little girl     Vertigo    takes Meclizine prn   Wears glasses    Wears glasses    Past Surgical History:  Procedure Laterality Date   ABDOMINAL HYSTERECTOMY     APPENDECTOMY     BREAST LUMPECTOMY Right 2016   CARPAL TUNNEL RELEASE     CESAREAN SECTION  6301,6010   COLONOSCOPY     ESOPHAGOGASTRODUODENOSCOPY     EXPLORATORY LAPAROTOMY     KNEE SURGERY  1998   right - arthroscopic   PARTIAL COLECTOMY  03/30/2012   Procedure: PARTIAL COLECTOMY;  Surgeon: Cherylynn Ridges, MD;  Location: [facility] OR;  Service: General;  Laterality: N/A;   PORT-A-CATH REMOVAL N/A 06/25/2015   Procedure: REMOVAL PORT-A-CATH;  Surgeon: Chevis Pretty III, MD;  Location: [facility] OR;  Service: General;  Laterality: N/A;   PORTACATH PLACEMENT     RADIOACTIVE SEED GUIDED PARTIAL MASTECTOMY WITH AXILLARY SENTINEL LYMPH NODE BIOPSY Right 10/17/2014   Procedure: RADIOACTIVE SEED GUIDED PARTIAL MASTECTOMY WITH AXILLARY SENTINEL LYMPH NODE BIOPSY;  Surgeon: Chevis Pretty III, MD;  Location: [facility];  Service: General;  Laterality: Right;   TOTAL SHOULDER ARTHROPLASTY Left 03/04/2016   Procedure: LEFT TOTAL SHOULDER ARTHROPLASTY;  Surgeon: Francena Hanly, MD;  Location: [facility] OR;  Service: Orthopedics;  Laterality: Left;    No Known Allergies  Allergies as of 01/27/2023   No Known Allergies      Medication List        Accurate as  of January 27, 2023 10:50 AM. If you have any questions, ask your nurse or doctor.          STOP taking these medications    simvastatin 40 MG tablet Commonly known as: ZOCOR Stopped by: Caesar Bookman, NP       TAKE these  medications    aspirin 81 MG tablet Take 81 mg by mouth daily.   atorvastatin 40 MG tablet Commonly known as: LIPITOR Take 1 tablet by mouth daily. What changed: Another medication with the same name was removed. Continue taking this medication, and follow the directions you see here. Changed by: Caesar Bookman, NP   buPROPion 150 MG 24 hr tablet Commonly known as: WELLBUTRIN XL Take 1 tablet (150 mg total) by mouth daily. Further refills will need an office visit as Theora Gianotti is no longer with this practice.   colchicine 0.6 MG tablet TAKE 2TABS AT FIRST SIGN OF GOUT, THEN 1TAB 1 HR LATER. TAKE 1TAB TWICE A DAY STARTING THE NEXT DAY   diphenhydrAMINE 25 MG tablet Commonly known as: BENADRYL Take 25 mg by mouth daily as needed for allergies.   diphenhydrAMINE 25 MG tablet Commonly known as: SOMINEX Take by mouth.   levothyroxine 112 MCG tablet Commonly known as: SYNTHROID TAKE 1 TABLET BY MOUTH EVERY DAY   levothyroxine 112 MCG tablet Commonly known as: SYNTHROID Take 112 mcg by mouth daily.   meloxicam 15 MG tablet Commonly known as: MOBIC TAKE 1 TABLET BY MOUTH EVERY DAY   nitroGLYCERIN 0.4 MG SL tablet Commonly known as: NITROSTAT Place 1 tablet (0.4 mg total) under the tongue every 5 (five) minutes as needed for chest pain.   PRENATAL VITAMINS PO Take 1 tablet by mouth daily.   triamterene-hydrochlorothiazide 37.5-25 MG tablet Commonly known as: MAXZIDE-25 TAKE 1 TABLET BY MOUTH DAILY.        Review of Systems  Constitutional:  Negative for appetite change, chills, fatigue, fever and unexpected weight change.  HENT:  Negative for congestion, dental problem, ear discharge, ear pain, facial swelling, hearing loss,  nosebleeds, postnasal drip, rhinorrhea, sinus pressure, sinus pain, sneezing, sore throat, tinnitus and trouble swallowing.   Eyes:  Positive for itching. Negative for pain, discharge, redness and visual disturbance.  Respiratory:  Negative for cough, chest tightness, shortness of breath and wheezing.   Cardiovascular:  Negative for chest pain, palpitations and leg swelling.  Gastrointestinal:  Negative for abdominal distention, abdominal pain, blood in stool, constipation, diarrhea, nausea and vomiting.  Endocrine: Positive for cold intolerance and heat intolerance. Negative for polydipsia, polyphagia and polyuria.  Genitourinary:  Negative for difficulty urinating, dysuria, flank pain, frequency and urgency.  Musculoskeletal:  Positive for arthralgias, gait problem and joint swelling. Negative for back pain, myalgias, neck pain and neck stiffness.  Skin:  Negative for color change, pallor, rash and wound.  Neurological:  Negative for dizziness, syncope, speech difficulty, weakness, light-headedness, numbness and headaches.  Hematological:  Does not bruise/bleed easily.  Psychiatric/Behavioral:  Negative for agitation, behavioral problems, confusion, hallucinations, self-injury, sleep disturbance and suicidal ideas. The patient is nervous/anxious.        Memory loss  Depression     Immunization History  Administered Date(s) Administered   COVID-19, mRNA, vaccine(Comirnaty)12 years and older 11/18/2022   Influenza Split 05/06/2011, 06/20/2012   Influenza, High Dose Seasonal PF 05/03/2018, 06/06/2019   Influenza,inj,Quad PF,6+ Mos 06/07/2013, 08/20/2014, 06/05/2015, 06/22/2016, 04/27/2017   PFIZER Comirnaty(Gray Top)Covid-19 Tri-Sucrose Vaccine 03/31/2021   PFIZER(Purple Top)SARS-COV-2 Vaccination 09/28/2019, 10/23/2019, 09/12/2020   Pneumococcal Conjugate-13 09/16/2015   Pneumococcal Polysaccharide-23 11/06/2013   Pneumococcal-Unspecified 11/06/2013   Tdap 07/20/2011   Zoster Recombinat  (Shingrix) 03/21/2020   Zoster, Live 08/17/2011   Pertinent  Health Maintenance Due  Topic Date Due   INFLUENZA VACCINE  03/17/2023   MAMMOGRAM  10/14/2023  Colonoscopy  02/21/2028   DEXA SCAN  Completed      05/30/2017   11:27 AM 06/20/2017   11:11 AM 09/29/2017   10:17 AM 11/02/2017   11:30 AM 01/27/2023   10:40 AM  Fall Risk  Falls in the past year? No No No No 0  Was there an injury with Fall?     0  Fall Risk Category Calculator     0  Patient at Risk for Falls Due to     No Fall Risks  Fall risk Follow up     Falls evaluation completed   Functional Status Survey:    Vitals:   01/27/23 1031  BP: 122/76  Pulse: 86  Resp: 20  Temp: 97.6 F (36.4 C)  SpO2: 98%  Weight: 220 lb 6.4 oz (100 kg)  Height: 5\' 1"  (1.549 m)   Body mass index is 41.64 kg/m. Physical Exam Vitals reviewed.  Constitutional:      General: She is not in acute distress.    Appearance: Normal appearance. She is obese. She is not ill-appearing or diaphoretic.  HENT:     Head: Normocephalic.     Right Ear: Tympanic membrane, ear canal and external ear normal. There is no impacted cerumen.     Left Ear: Tympanic membrane, ear canal and external ear normal. There is no impacted cerumen.     Nose: Nose normal. No congestion or rhinorrhea.     Mouth/Throat:     Mouth: Mucous membranes are moist.     Pharynx: Oropharynx is clear. No oropharyngeal exudate or posterior oropharyngeal erythema.  Eyes:     General: No scleral icterus.       Right eye: No discharge.        Left eye: No discharge.     Extraocular Movements: Extraocular movements intact.     Conjunctiva/sclera: Conjunctivae normal.     Pupils: Pupils are equal, round, and reactive to light.  Neck:     Vascular: No carotid bruit.  Cardiovascular:     Rate and Rhythm: Normal rate and regular rhythm.     Pulses: Normal pulses.     Heart sounds: Normal heart sounds. No murmur heard.    No friction rub. No gallop.  Pulmonary:      Effort: Pulmonary effort is normal. No respiratory distress.     Breath sounds: Normal breath sounds. No wheezing, rhonchi or rales.  Chest:     Chest wall: No tenderness.  Abdominal:     General: Bowel sounds are normal. There is no distension.     Palpations: Abdomen is soft. There is no mass.     Tenderness: There is generalized abdominal tenderness. There is no right CVA tenderness, left CVA tenderness, guarding or rebound.  Musculoskeletal:        General: No swelling or tenderness. Normal range of motion.     Cervical back: Normal range of motion. No rigidity or tenderness.     Right lower leg: No edema.     Left lower leg: No edema.  Lymphadenopathy:     Cervical: No cervical adenopathy.  Skin:    General: Skin is warm and dry.     Coloration: Skin is not pale.     Findings: No bruising, erythema, lesion or rash.  Neurological:     Mental Status: She is alert and oriented to person, place, and time.     Cranial Nerves: No cranial nerve deficit.     Sensory: No sensory deficit.  Motor: No weakness.     Coordination: Coordination normal.     Gait: Gait abnormal.  Psychiatric:        Mood and Affect: Mood normal.        Speech: Speech normal.        Behavior: Behavior normal.        Thought Content: Thought content normal.        Judgment: Judgment normal.     Labs reviewed: No results for input(s): "NA", "K", "CL", "CO2", "GLUCOSE", "BUN", "CREATININE", "CALCIUM", "MG", "PHOS" in the last 8760 hours. No results for input(s): "AST", "ALT", "ALKPHOS", "BILITOT", "PROT", "ALBUMIN" in the last 8760 hours. No results for input(s): "WBC", "NEUTROABS", "HGB", "HCT", "MCV", "PLT" in the last 8760 hours. Lab Results  Component Value Date   TSH 0.554 11/02/2017   Lab Results  Component Value Date   HGBA1C 5.9 (H) 11/02/2017   Lab Results  Component Value Date   CHOL 189 11/02/2017   HDL 71 11/02/2017   LDLCALC 101 (H) 11/02/2017   TRIG 85 11/02/2017   CHOLHDL 2.7  11/02/2017    Significant Diagnostic Results in last 30 days:  No results found.  Assessment/Plan  1. Essential hypertension B/p at goal  - continue triamterene - hydrochlorothiazide  - TSH - COMPLETE METABOLIC PANEL WITH GFR - CBC with Differential/Platelet - triamterene-hydrochlorothiazide (MAXZIDE-25) 37.5-25 MG tablet; Take 1 tablet by mouth daily.  Dispense: 90 tablet; Refill: 3  2. Mixed hyperlipidemia No latest LDL for review  - continue with dietary modification and exercise  - continue on atorvastatin  - Lipid panel  3. DDD (degenerative disc disease), lumbar Chronic  - continue on Gabapentin   4. Breast cancer screening by mammogram Hx of right breast cancer.has felt small swelling on right inner upper quadrant.No erythema or drainage noted.  - MM DIGITAL SCREENING BILATERAL  5. Prediabetes Lab Results  Component Value Date   HGBA1C 5.9 (H) 11/02/2017   - Hemoglobin A1c  6. Acquired hypothyroidism Lab Results  Component Value Date   TSH 0.554 11/02/2017  - continue on levothyroxine 112 mcg table daily on empty stomach.  - levothyroxine (SYNTHROID) 112 MCG tablet; Take 1 tablet (112 mcg total) by mouth daily.  Dispense: 90 tablet; Refill: 1 - TSH  7. Depression Mood stable  - continue on Bupropion  - buPROPion (WELLBUTRIN XL) 150 MG 24 hr tablet; Take 1 tablet (150 mg total) by mouth daily. Further refills will need an office visit as Theora Gianotti is no longer with this practice.  Dispense: 90 tablet; Refill: 0  8. Class 3 drug-induced obesity with serious comorbidity and body mass index (BMI) of 40.0 to 44.9 in adult (HCC) BMI 41.64 with associated comorbidity Hyperlipidemia, Hypertension,GERD,prediabetes and osteoarthritis.   9. Encounter to establish care Immunization reviewed up to date except due for Tdap ,shingles and COVID-19 vaccine.Aware to get vaccine at the pharmacy.Recommended fasting lab today since she is fasting.Also schedule for routine  regular check up.   Family/ staff Communication: Reviewed plan of care with patient verbalized understanding  Labs/tests ordered:  - CBC with Differential/Platelet - CMP with eGFR([facility]) - TSH - Hgb A1C - Lipid panel  Next Appointment : Return in about 6 months (around 07/29/2023) for medical mangement of chronic issues.Annual Wellness 03/2023 .   Caesar Bookman, NP

## 2023-01-28 LAB — HEMOGLOBIN A1C
Hgb A1c MFr Bld: 6.1 % of total Hgb — ABNORMAL HIGH (ref <5.7)
Mean Plasma Glucose: 128 mg/dL
eAG (mmol/L): 7.1 mmol/L

## 2023-01-28 LAB — LIPID PANEL
Cholesterol: 187 mg/dL (ref <200)
HDL: 83 mg/dL (ref >=50)
LDL Cholesterol (Calc): 85 mg/dL (calc)
Non-HDL Cholesterol (Calc): 104 mg/dL (calc) (ref <130)
Total CHOL/HDL Ratio: 2.3 (calc) (ref <5.0)
Triglycerides: 95 mg/dL (ref <150)

## 2023-01-28 LAB — COMPLETE METABOLIC PANEL WITH GFR
AG Ratio: 1.4 (calc) (ref 1.0–2.5)
ALT: 34 U/L — ABNORMAL HIGH (ref 6–29)
AST: 34 U/L (ref 10–35)
Albumin: 3.8 g/dL (ref 3.6–5.1)
Alkaline phosphatase (APISO): 69 U/L (ref 37–153)
BUN: 13 mg/dL (ref 7–25)
CO2: 22 mmol/L (ref 20–32)
Calcium: 8.9 mg/dL (ref 8.6–10.4)
Chloride: 106 mmol/L (ref 98–110)
Creat: 0.98 mg/dL (ref 0.60–1.00)
Globulin: 2.8 g/dL (calc) (ref 1.9–3.7)
Glucose, Bld: 102 mg/dL — ABNORMAL HIGH (ref 65–99)
Potassium: 3.9 mmol/L (ref 3.5–5.3)
Sodium: 140 mmol/L (ref 135–146)
Total Bilirubin: 0.9 mg/dL (ref 0.2–1.2)
Total Protein: 6.6 g/dL (ref 6.1–8.1)
eGFR: 61 mL/min/{1.73_m2} (ref >=60)

## 2023-01-28 LAB — CBC WITH DIFFERENTIAL/PLATELET
Absolute Monocytes: 490 cells/uL (ref 200–950)
Basophils Absolute: 49 cells/uL (ref 0–200)
Basophils Relative: 0.7 %
Eosinophils Absolute: 224 cells/uL (ref 15–500)
Eosinophils Relative: 3.2 %
HCT: 47.8 % — ABNORMAL HIGH (ref 35.0–45.0)
Hemoglobin: 15.3 g/dL (ref 11.7–15.5)
Lymphs Abs: 1988 cells/uL (ref 850–3900)
MCH: 28.7 pg (ref 27.0–33.0)
MCHC: 32 g/dL (ref 32.0–36.0)
MCV: 89.5 fL (ref 80.0–100.0)
MPV: 10.3 fL (ref 7.5–12.5)
Monocytes Relative: 7 %
Neutro Abs: 4249 cells/uL (ref 1500–7800)
Neutrophils Relative %: 60.7 %
Platelets: 255 10*3/uL (ref 140–400)
RBC: 5.34 10*6/uL — ABNORMAL HIGH (ref 3.80–5.10)
RDW: 13.2 % (ref 11.0–15.0)
Total Lymphocyte: 28.4 %
WBC: 7 10*3/uL (ref 3.8–10.8)

## 2023-01-28 LAB — TSH: TSH: 1.04 mIU/L (ref 0.40–4.50)

## 2023-03-30 ENCOUNTER — Encounter: Payer: Medicare PPO | Admitting: Family

## 2023-04-05 ENCOUNTER — Encounter: Payer: Self-pay | Admitting: Family

## 2023-04-05 ENCOUNTER — Ambulatory Visit (INDEPENDENT_AMBULATORY_CARE_PROVIDER_SITE_OTHER): Payer: Medicare PPO | Admitting: Family

## 2023-04-05 VITALS — BP 120/80 | HR 80 | Temp 97.7°F | Resp 18 | Ht 61.0 in | Wt 226.0 lb

## 2023-04-05 DIAGNOSIS — Z0000 Encounter for general adult medical examination without abnormal findings: Secondary | ICD-10-CM | POA: Diagnosis not present

## 2023-04-05 MED ORDER — GABAPENTIN 100 MG PO CAPS: 200.0000 mg | ORAL_CAPSULE | Freq: Every day | ORAL | 1 refills | Status: AC

## 2023-04-05 NOTE — Progress Notes (Signed)
Subjective:   Jordan [name] is a 72 y.o. female who presents for Medicare Annual (Subsequent) preventive examination.  Visit Complete: In person  Patient Medicare AWV questionnaire was completed by the patient on 04/05/2023; I have confirmed that all information answered by patient is correct and no changes since this date.  Review of Systems    Cardiac Risk Factors include: advanced age (>58men, >17 women);hypertension;family history of premature cardiovascular disease;smoking/ tobacco exposure;dyslipidemia;obesity (BMI >30kg/m2);sedentary lifestyle     Objective:    Today's Vitals   04/05/23 1130  BP: 120/80  Pulse: 80  Resp: 18  Temp: 97.7 F (36.5 C)  SpO2: 97%  Weight: 226 lb (102.5 kg)  Height: 5\' 1"  (1.549 m)   Body mass index is 42.7 kg/m.     04/05/2023   11:35 AM 01/27/2023   10:42 AM 05/30/2017   11:24 AM 12/13/2016    9:16 AM 05/10/2016    1:52 PM 04/01/2016   11:11 AM 03/02/2016    9:15 AM  Advanced Directives  Does Patient Have a Medical Advance Directive? Yes Yes No No No No No  Type of Estate agent of [place];Living will        Does patient want to make changes to medical advance directive? No - Patient declined No - Patient declined       Would patient like information on creating a medical advance directive?  Yes (Inpatient - patient defers creating a medical advance directive at this time - Information given) No - Patient declined Yes (MAU/Ambulatory/Procedural Areas - Information given) Yes - Educational materials given No - patient declined information No - patient declined information    Current Medications (verified) Outpatient Encounter Medications as of 04/05/2023  Medication Sig   aspirin 81 MG tablet Take 81 mg by mouth daily.   atorvastatin (LIPITOR) 40 MG tablet Take 1 tablet (40 mg total) by mouth daily.   buPROPion (WELLBUTRIN XL) 150 MG 24 hr tablet Take 1 tablet (150 mg total) by mouth daily. Further  refills will need an office visit as Theora Gianotti is no longer with this practice.   colchicine 0.6 MG tablet TAKE 2TABS AT FIRST SIGN OF GOUT, THEN 1TAB 1 HR LATER. TAKE 1TAB TWICE A DAY STARTING THE NEXT DAY   CRANBERRY PO Take by mouth daily.   diphenhydrAMINE (BENADRYL) 25 MG tablet Take 25 mg by mouth daily as needed for allergies.   diphenhydrAMINE (SOMINEX) 25 MG tablet Take by mouth.   gabapentin (NEURONTIN) 100 MG capsule Take 2 capsules (200 mg total) by mouth at bedtime.   levothyroxine (SYNTHROID) 112 MCG tablet Take 1 tablet (112 mcg total) by mouth daily.   Misc Natural Products (BEET ROOT PO) Take by mouth daily.   nitroGLYCERIN (NITROSTAT) 0.4 MG SL tablet Place 1 tablet (0.4 mg total) under the tongue every 5 (five) minutes as needed for chest pain.   Prenatal Multivit-Min-Fe-FA (PRENATAL VITAMINS PO) Take 1 tablet by mouth daily.    TART CHERRY PO Take by mouth daily.   triamterene-hydrochlorothiazide (MAXZIDE-25) 37.5-25 MG tablet Take 1 tablet by mouth daily.   Facility-Administered Encounter Medications as of 04/05/2023  Medication   chlorhexidine (HIBICLENS) 4 % liquid 1 application    Allergies (verified) Patient has no known allergies.   History: Past Medical History:  Diagnosis Date   Anemia    Arthritis    oa left shoulder   Back pain    buldging disc   Blood clot of neck  vein    Breast cancer (HCC) 10/17/2014   lower inner quadrant of the right breast   Cancer (HCC)    colon   Constipation    Depression    takes Wellbutrin daily   Dizziness    r/t side effects from meds   Family history of adverse reaction to anesthesia    It is hard for my mother to wake up from anesthesia.   Generalized headaches    due to allergies, sinus   GERD (gastroesophageal reflux disease)    takes Protonix as needed   Hemorrhoids    History of bronchitis    last time about 6-21yrs ago   History of colon polyps    History of hiatal hernia    History of migraine     last one about 15+yrs ago   History of UTI    Hx of seasonal allergies    takes OTC allergy med nightly   Hyperlipidemia    takes Zetia and Zocor daily   Hypertension    takes Maxzide and Metoprolol daily   Hypothyroidism    takes Synthroid daily   Joint pain    Leg swelling    Mass of colon    Nasal congestion    Nausea    Personal history of chemotherapy    Personal history of radiation therapy    Pneumonia    walking about 6-72yrs ago   PONV (postoperative nausea and vomiting)    pt states she is very easy to sedate   S/P radiation therapy 03/11/2015 through 04/25/2015                                                      Right breast 4500 cGy in 25 sessions, right breast boost 1600 cGy in 8 sessions                         Thyroid disease    Tuberculosis    as little girl     Vertigo    takes Meclizine prn   Wears glasses    Wears glasses    Past Surgical History:  Procedure Laterality Date   ABDOMINAL HYSTERECTOMY     APPENDECTOMY     BREAST LUMPECTOMY Right 2016   CARPAL TUNNEL RELEASE     CESAREAN SECTION  [id number],[id number]   COLONOSCOPY     ESOPHAGOGASTRODUODENOSCOPY     EXPLORATORY LAPAROTOMY     KNEE SURGERY  1998   right - arthroscopic   PARTIAL COLECTOMY  03/30/2012   Procedure: PARTIAL COLECTOMY;  Surgeon: Cherylynn Ridges, MD;  Location: [facility] OR;  Service: General;  Laterality: N/A;   PORT-A-CATH REMOVAL N/A 06/25/2015   Procedure: REMOVAL PORT-A-CATH;  Surgeon: Chevis Pretty III, MD;  Location: [facility] OR;  Service: General;  Laterality: N/A;   PORTACATH PLACEMENT     RADIOACTIVE SEED GUIDED PARTIAL MASTECTOMY WITH AXILLARY SENTINEL LYMPH NODE BIOPSY Right 10/17/2014   Procedure: RADIOACTIVE SEED GUIDED PARTIAL MASTECTOMY WITH AXILLARY SENTINEL LYMPH NODE BIOPSY;  Surgeon: Chevis Pretty III, MD;  Location: [facility];  Service: General;  Laterality: Right;   TOTAL SHOULDER ARTHROPLASTY Left 03/04/2016   Procedure: LEFT TOTAL SHOULDER ARTHROPLASTY;  Surgeon: Francena Hanly, MD;  Location: [facility] OR;  Service: Orthopedics;  Laterality: Left;   Family History  Problem Relation Age of Onset   Deep vein thrombosis Mother    Dementia Mother    Heart attack Mother    Heart attack Father    Dementia Father    Prostate cancer Father    Mental illness Sister        history of Depression   Seizures Sister 8       s/p traumatic brain injury   Lung cancer Sister    Deep vein thrombosis Sister    Prostate cancer Brother    Cancer Brother 38       stage IV colon (2012)   Prostate cancer Brother 6   Pulmonary embolism Brother        long-distance truck driver (PE x 2)   Prostate cancer Brother    Heart disease Brother    Breast cancer Maternal Grandmother 78   Cancer Maternal Grandmother 78       breast   Diabetes Maternal Grandfather    Colon cancer Paternal Grandmother    Breast cancer Maternal Aunt 48   Cancer Maternal Aunt 48       breast   Cancer Maternal Aunt 69       unknown type   Prostate cancer Maternal Uncle    Social History   Socioeconomic History   Marital status: Legally Separated    Spouse name: Deniece Portela   Number of children: 1   Years of education: 16   Highest education level: Not on file  Occupational History   Occupation: retired Runner, broadcasting/film/video  Tobacco Use   Smoking status: Never   Smokeless tobacco: Never  Vaping Use   Vaping status: Never Used  Substance and Sexual Activity   Alcohol use: Yes    Comment: RARELY   Drug use: Not Currently    Types: Marijuana   Sexual activity: Yes    Partners: Male    Birth control/protection: Surgical  Other Topics Concern   Not on file  Social History Narrative   Lives with her husband and her granddaughter, who she has raised as her own.  Marriage has been unhappy for a long time, and planned to leave when her granddaughter left for college.  The granddaughter currently studying make-up at a community college and may pursue cosmetology in the future.    Cares for her mother, who has  dementia, which has further delayed her plans to leave her husband.   Social Determinants of Health   Financial Resource Strain: Low Risk  (09/13/2022)   Received from [facility]   Overall Financial Resource Strain (CARDIA)    Difficulty of Paying Living Expenses: Not hard at all  Food Insecurity: No Food Insecurity (09/13/2022)   Received from [facility]   Hunger Vital Sign    Worried About Running Out of Food in the Last Year: Never true    Ran Out of Food in the Last Year: Never true  Transportation Needs: No Transportation Needs (09/13/2022)   Received from [facility]   PRAPARE - Transportation    Lack of Transportation (Medical): No    Lack of Transportation (Non-Medical): No  Physical Activity: Insufficiently Active (04/02/2022)   Received from [facility]   Exercise Vital Sign    Days of Exercise per Week: 5 days    Minutes of Exercise per Session: 20 min  Stress: Stress Concern Present (04/02/2022)   Received from [facility] - Occupational  Stress Questionnaire    Feeling of Stress : To some extent  Social Connections: Unknown (04/03/2023)   Received from [facility]   Social Network    Social Network: Not on file    Tobacco Counseling Counseling given: Not Answered   Clinical Intake:  Pre-visit preparation completed: No  Pain : 0-10 Pain Score:  (last night was 10/10 scale) Pain Type: Chronic pain Pain Location: Back Pain Orientation: Lower Pain Radiating Towards: left leg Pain Descriptors / Indicators: Aching, Sharp Pain Frequency: Constant Pain Relieving Factors: exercise Effect of Pain on Daily Activities: sitting or standing too long  Pain Relieving Factors: exercise  BMI - recorded: 42.7 Nutritional Status: BMI > 30  Obese Nutritional Risks: None Diabetes: No  How often do you need to have someone help you when you read  instructions, pamphlets, or other written materials from your doctor or pharmacy?: 2 - Rarely What is the last grade level you completed in school?: College  Interpreter Needed?: No      Activities of Daily Living    04/05/2023   12:15 PM  In your present state of health, do you have any difficulty performing the following activities:  Hearing? 0  Vision? 0  Difficulty concentrating or making decisions? 0  Walking or climbing stairs? 1  Comment uses a cane  Dressing or bathing? 0  Doing errands, shopping? 0  Preparing Food and eating ? N  Using the Toilet? N  In the past six months, have you accidently leaked urine? N  Do you have problems with loss of bowel control? N  Managing your Medications? N  Managing your Finances? N  Housekeeping or managing your Housekeeping? N    Patient Care Team: Sienna Stonehocker, Donalee Citrin, NP as PCP - General (Family Medicine) Griselda Miner, MD as Consulting Physician (General Surgery) Magrinat, Valentino Hue, MD (Inactive) as Consulting Physician (Oncology) Chipper Herb, MD (Inactive) as Consulting Physician (Radiation Oncology) Hubbard Hartshorn, NP (Inactive) as Nurse Practitioner (Nurse Practitioner) Salomon Fick, NP as Nurse Practitioner (Nurse Practitioner) Charna Elizabeth, MD as Consulting Physician (Gastroenterology) [facility]  Indicate any recent Medical Services you may have received from other than [facility] providers in the past year (date may be approximate).     Assessment:   This is a routine wellness examination for Rosely.  Hearing/Vision screen No results found.  Dietary issues and exercise activities discussed:     Goals Addressed             This Visit's Progress    Eat more fruits and vegetables   On track    Patient is working on eating more healthier foods since she was recently diagnosed with prediabetes.       Depression Screen    04/05/2023   11:35 AM 11/02/2017   11:30  AM 09/29/2017   10:17 AM 06/20/2017   11:11 AM 05/30/2017   11:27 AM 04/27/2017   11:38 AM 02/03/2017   11:17 AM  PHQ 2/9 Scores  PHQ - 2 Score 0 0 0 0 0 0 0  PHQ- 9 Score 0        Exception Documentation      Medical reason     Fall Risk    01/27/2023   10:40 AM 11/02/2017   11:30 AM 09/29/2017   10:17 AM 06/20/2017   11:11 AM 05/30/2017   11:27 AM  Fall Risk   Falls in the past year? 0 No No No No  Number falls in past yr: 0      Injury with Fall? 0      Risk for fall due to : No Fall Risks      Follow up Falls evaluation completed        MEDICARE RISK AT HOME: Medicare Risk at Home Any stairs in or around the home?: No If so, are there any without handrails?: No Home free of loose throw rugs in walkways, pet beds, electrical cords, etc?: No Adequate lighting in your home to reduce risk of falls?: Yes Life alert?: No Use of a cane, walker or w/c?: Yes Grab bars in the bathroom?: Yes Shower chair or bench in shower?: No Elevated toilet seat or a handicapped toilet?: Yes  TIMED UP AND GO:  Was the test performed?  Yes  Length of time to ambulate 10 feet: 15 sec Gait slow and steady with assistive device    Cognitive Function:    04/05/2023   11:35 AM  MMSE - Mini Mental State Exam  Orientation to time 5  Orientation to Place 5  Registration 3  Attention/ Calculation 0  Recall 3  Language- name 2 objects 2  Language- repeat 1  Language- follow 3 step command 3  Language- read & follow direction 1  Write a sentence 1  Copy design 1  Total score 25        05/30/2017   11:40 AM  6CIT Screen  What Year? 0 points  What month? 0 points  What time? 0 points  Count back from 20 0 points  Months in reverse 0 points  Repeat phrase 2 points  Total Score 2 points    Immunizations Immunization History  Administered Date(s) Administered   COVID-19, mRNA, vaccine(Comirnaty)12 years and older 11/18/2022   Fluad Quad(high Dose 65+) 05/03/2018, 06/04/2020,  07/01/2021, 07/20/2022   Influenza Split 05/06/2011, 06/20/2012   Influenza, High Dose Seasonal PF 05/03/2018, 06/06/2019   Influenza,inj,Quad PF,6+ Mos 06/07/2013, 08/20/2014, 06/05/2015, 06/22/2016, 04/27/2017   Influenza,trivalent, recombinat, inj, PF 05/06/2011, 06/20/2012   Influenza-Unspecified 06/07/2013, 08/20/2014, 06/05/2015, 06/22/2016, 04/27/2017, 05/03/2018, 06/06/2019   PFIZER Comirnaty(Gray Top)Covid-19 Tri-Sucrose Vaccine 03/31/2021   PFIZER(Purple Top)SARS-COV-2 Vaccination 09/28/2019, 10/23/2019, 09/12/2020   Pneumococcal Conjugate-13 09/16/2015   Pneumococcal Polysaccharide-23 11/06/2013   Pneumococcal-Unspecified 11/06/2013   Polio, Unspecified 03/23/1955, 04/06/1955, 11/11/1955, 03/17/1960   Tdap 07/20/2011   Zoster Recombinant(Shingrix) 03/21/2020, 01/27/2023   Zoster, Live 08/17/2011    TDAP status: Due, Education has been provided regarding the importance of this vaccine. Advised may receive this vaccine at local pharmacy or Health Dept. Aware to provide a copy of the vaccination record if obtained from local pharmacy or Health Dept. Verbalized acceptance and understanding.  Flu Vaccine status: Due, Education has been provided regarding the importance of this vaccine. Advised may receive this vaccine at local pharmacy or Health Dept. Aware to provide a copy of the vaccination record if obtained from local pharmacy or Health Dept. Verbalized acceptance and understanding.  Pneumococcal vaccine status: Up to date  Covid-19 vaccine status: Information provided on how to obtain vaccines.   Qualifies for Shingles Vaccine? Yes   Zostavax completed Yes   Shingrix Completed?: Yes  Screening Tests Health Maintenance  Topic Date Due   DTaP/Tdap/Td (2 - Td or Tdap) 07/19/2021   COVID-19 Vaccine (6 - 2023-24 season) 01/13/2023   INFLUENZA VACCINE  03/17/2023   MAMMOGRAM  10/14/2023   Medicare Annual Wellness (AWV)  04/04/2024   Colonoscopy  02/21/2028   Pneumonia  Vaccine 65+ Years  old  Completed   DEXA SCAN  Completed   Hepatitis C Screening  Completed   Zoster Vaccines- Shingrix  Completed   HPV VACCINES  Aged Out    Health Maintenance  Health Maintenance Due  Topic Date Due   DTaP/Tdap/Td (2 - Td or Tdap) 07/19/2021   COVID-19 Vaccine (6 - 2023-24 season) 01/13/2023   INFLUENZA VACCINE  03/17/2023    Colorectal cancer screening: No longer required.   Mammogram status: Ordered 01/26/2022. Pt provided with contact info and advised to call to schedule appt.   Bone Density status: Completed 09/03/2014. Results reflect: Bone density results: OSTEOPENIA. Repeat every 2 years.  Lung Cancer Screening: (Low Dose CT Chest recommended if Age 3-80 years, 20 pack-year currently smoking OR have quit w/in 15years.) does not qualify.   Lung Cancer Screening Referral: No   Additional Screening:  Hepatitis C Screening: does qualify; Completed Yes   Vision Screening: Recommended annual ophthalmology exams for early detection of glaucoma and other disorders of the eye. Is the patient up to date with their annual eye exam?  No  Who is the provider or what is the name of the office in which the patient attends annual eye exams? [facility]  If pt is not established with a provider, would they like to be referred to a provider to establish care? No .   Dental Screening: Recommended annual dental exams for proper oral hygiene  Diabetic Foot Exam: Diabetic Foot Exam: Completed N/A   Community Resource Referral / Chronic Care Management: CRR required this visit?  No   CCM required this visit?  No     Plan:     I have personally reviewed and noted the following in the patient's chart:   Medical and social history Use of alcohol, tobacco or illicit drugs  Current medications and supplements including opioid prescriptions. Patient is not currently taking opioid prescriptions. Functional ability and status Nutritional status Physical  activity Advanced directives List of other physicians Hospitalizations, surgeries, and ER visits in previous 12 months Vitals Screenings to include cognitive, depression, and falls Referrals and appointments  In addition, I have reviewed and discussed with patient certain preventive protocols, quality metrics, and best practice recommendations. A written personalized care plan for preventive services as well as general preventive health recommendations were provided to patient.     Caesar Bookman, NP   04/05/2023   After Visit Summary: (Pick Up) Due to this being a telephonic visit, with patients personalized plan was offered to patient and patient has requested to Pick up at office.  Nurse Notes: Advised to get vaccine at the pharmacy

## 2023-04-05 NOTE — Patient Instructions (Signed)
Jordan [name] , Thank you for taking time to come for your Medicare Wellness Visit. I appreciate your ongoing commitment to your health goals. Please review the following plan we discussed and let me know if I can assist you in the future.   Screening recommendations/referrals: Colonoscopy : up to date  Mammogram Due  Bone Density : Due  Recommended yearly ophthalmology/optometry visit for glaucoma screening and checkup Recommended yearly dental visit for hygiene and checkup  Vaccinations: Influenza vaccine- due annually in September/October Pneumococcal vaccine : up to date  Tdap vaccine : Due  Shingles vaccine : up to date     Advanced directives: yes   Conditions/risks identified:  advanced age (>86men, >61 women);hypertension;family history of premature cardiovascular disease;smoking/ tobacco exposure;dyslipidemia;obesity (BMI >30kg/m2);sedentary lifestyle  Next appointment: 1 year    Preventive Care 39 Years and Older, Female Preventive care refers to lifestyle choices and visits with your health care provider that can promote health and wellness. What does preventive care include? A yearly physical exam. This is also called an annual well check. Dental exams once or twice a year. Routine eye exams. Ask your health care provider how often you should have your eyes checked. Personal lifestyle choices, including: Daily care of your teeth and gums. Regular physical activity. Eating a healthy diet. Avoiding tobacco and drug use. Limiting alcohol use. Practicing safe sex. Taking low-dose aspirin every day. Taking vitamin and mineral supplements as recommended by your health care provider. What happens during an annual well check? The services and screenings done by your health care provider during your annual well check will depend on your age, overall health, lifestyle risk factors, and family history of disease. Counseling  Your health care provider may ask you questions  about your: Alcohol use. Tobacco use. Drug use. Emotional well-being. Home and relationship well-being. Sexual activity. Eating habits. History of falls. Memory and ability to understand (cognition). Work and work Astronomer. Reproductive health. Screening  You may have the following tests or measurements: Height, weight, and BMI. Blood pressure. Lipid and cholesterol levels. These may be checked every 5 years, or more frequently if you are over 53 years old. Skin check. Lung cancer screening. You may have this screening every year starting at age 62 if you have a 30-pack-year history of smoking and currently smoke or have quit within the past 15 years. Fecal occult blood test (FOBT) of the stool. You may have this test every year starting at age 64. Flexible sigmoidoscopy or colonoscopy. You may have a sigmoidoscopy every 5 years or a colonoscopy every 10 years starting at age 22. Hepatitis C blood test. Hepatitis B blood test. Sexually transmitted disease (STD) testing. Diabetes screening. This is done by checking your blood sugar (glucose) after you have not eaten for a while (fasting). You may have this done every 1-3 years. Bone density scan. This is done to screen for osteoporosis. You may have this done starting at age 43. Mammogram. This may be done every 1-2 years. Talk to your health care provider about how often you should have regular mammograms. Talk with your health care provider about your test results, treatment options, and if necessary, the need for more tests. Vaccines  Your health care provider may recommend certain vaccines, such as: Influenza vaccine. This is recommended every year. Tetanus, diphtheria, and acellular pertussis (Tdap, Td) vaccine. You may need a Td booster every 10 years. Zoster vaccine. You may need this after age 4. Pneumococcal 13-valent conjugate (PCV13) vaccine. One dose is  recommended after age 37. Pneumococcal polysaccharide (PPSV23)  vaccine. One dose is recommended after age 28. Talk to your health care provider about which screenings and vaccines you need and how often you need them. This information is not intended to replace advice given to you by your health care provider. Make sure you discuss any questions you have with your health care provider. Document Released: 08/29/2015 Document Revised: 04/21/2016 Document Reviewed: 06/03/2015 Elsevier Interactive Patient Education  2017 [employer].  Fall Prevention in the Home Falls can cause injuries. They can happen to people of all ages. There are many things you can do to make your home safe and to help prevent falls. What can I do on the outside of my home? Regularly fix the edges of walkways and driveways and fix any cracks. Remove anything that might make you trip as you walk through a door, such as a raised step or threshold. Trim any bushes or trees on the path to your home. Use bright outdoor lighting. Clear any walking paths of anything that might make someone trip, such as rocks or tools. Regularly check to see if handrails are loose or broken. Make sure that both sides of any steps have handrails. Any raised decks and porches should have guardrails on the edges. Have any leaves, snow, or ice cleared regularly. Use sand or salt on walking paths during winter. Clean up any spills in your garage right away. This includes oil or grease spills. What can I do in the bathroom? Use night lights. Install grab bars by the toilet and in the tub and shower. Do not use towel bars as grab bars. Use non-skid mats or decals in the tub or shower. If you need to sit down in the shower, use a plastic, non-slip stool. Keep the floor dry. Clean up any water that spills on the floor as soon as it happens. Remove soap buildup in the tub or shower regularly. Attach bath mats securely with double-sided non-slip rug tape. Do not have throw rugs and other things on the floor that  can make you trip. What can I do in the bedroom? Use night lights. Make sure that you have a light by your bed that is easy to reach. Do not use any sheets or blankets that are too big for your bed. They should not hang down onto the floor. Have a firm chair that has side arms. You can use this for support while you get dressed. Do not have throw rugs and other things on the floor that can make you trip. What can I do in the kitchen? Clean up any spills right away. Avoid walking on wet floors. Keep items that you use a lot in easy-to-reach places. If you need to reach something above you, use a strong step stool that has a grab bar. Keep electrical cords out of the way. Do not use floor polish or wax that makes floors slippery. If you must use wax, use non-skid floor wax. Do not have throw rugs and other things on the floor that can make you trip. What can I do with my stairs? Do not leave any items on the stairs. Make sure that there are handrails on both sides of the stairs and use them. Fix handrails that are broken or loose. Make sure that handrails are as long as the stairways. Check any carpeting to make sure that it is firmly attached to the stairs. Fix any carpet that is loose or worn. Avoid having  throw rugs at the top or bottom of the stairs. If you do have throw rugs, attach them to the floor with carpet tape. Make sure that you have a light switch at the top of the stairs and the bottom of the stairs. If you do not have them, ask someone to add them for you. What else can I do to help prevent falls? Wear shoes that: Do not have high heels. Have rubber bottoms. Are comfortable and fit you well. Are closed at the toe. Do not wear sandals. If you use a stepladder: Make sure that it is fully opened. Do not climb a closed stepladder. Make sure that both sides of the stepladder are locked into place. Ask someone to hold it for you, if possible. Clearly mark and make sure that you  can see: Any grab bars or handrails. First and last steps. Where the edge of each step is. Use tools that help you move around (mobility aids) if they are needed. These include: Canes. Walkers. Scooters. Crutches. Turn on the lights when you go into a dark area. Replace any light bulbs as soon as they burn out. Set up your furniture so you have a clear path. Avoid moving your furniture around. If any of your floors are uneven, fix them. If there are any pets around you, be aware of where they are. Review your medicines with your doctor. Some medicines can make you feel dizzy. This can increase your chance of falling. Ask your doctor what other things that you can do to help prevent falls. This information is not intended to replace advice given to you by your health care provider. Make sure you discuss any questions you have with your health care provider. Document Released: 05/29/2009 Document Revised: 01/08/2016 Document Reviewed: 09/06/2014 Elsevier Interactive Patient Education  2017 [employer].

## 2023-05-09 ENCOUNTER — Ambulatory Visit: Payer: Medicare PPO | Admitting: Adult Health

## 2023-05-09 ENCOUNTER — Encounter: Payer: Self-pay | Admitting: Adult Health

## 2023-05-09 VITALS — BP 118/88 | HR 72 | Temp 97.7°F | Resp 18 | Ht 61.0 in | Wt 220.2 lb

## 2023-05-09 DIAGNOSIS — U071 COVID-19: Secondary | ICD-10-CM

## 2023-05-09 DIAGNOSIS — R051 Acute cough: Secondary | ICD-10-CM | POA: Diagnosis not present

## 2023-05-09 DIAGNOSIS — I10 Essential (primary) hypertension: Secondary | ICD-10-CM

## 2023-05-09 DIAGNOSIS — E039 Hypothyroidism, unspecified: Secondary | ICD-10-CM

## 2023-05-09 DIAGNOSIS — J22 Unspecified acute lower respiratory infection: Secondary | ICD-10-CM

## 2023-05-09 DIAGNOSIS — J029 Acute pharyngitis, unspecified: Secondary | ICD-10-CM

## 2023-05-09 DIAGNOSIS — R197 Diarrhea, unspecified: Secondary | ICD-10-CM

## 2023-05-09 LAB — POCT RAPID STREP A (OFFICE): Rapid Strep A Screen: NEGATIVE

## 2023-05-09 MED ORDER — GUAIFENESIN ER 600 MG PO TB12: 600.0000 mg | ORAL_TABLET | Freq: Two times a day (BID) | ORAL | Status: AC

## 2023-05-09 MED ORDER — GUAIFENESIN ER 600 MG PO TB12: 600.0000 mg | ORAL_TABLET | Freq: Two times a day (BID) | ORAL | 0 refills | Status: DC

## 2023-05-09 MED ORDER — LOPERAMIDE HCL 2 MG PO TABS: 2.0000 mg | ORAL_TABLET | Freq: Four times a day (QID) | ORAL | Status: DC | PRN

## 2023-05-09 MED ORDER — DOXYCYCLINE HYCLATE 100 MG PO TABS: 100.0000 mg | ORAL_TABLET | Freq: Two times a day (BID) | ORAL | 0 refills | Status: AC

## 2023-05-09 NOTE — Progress Notes (Unsigned)
[facility]  Provider:  Kenard Gower DNP  Code Status:  Full Code  Goals of Care:     04/05/2023   11:35 AM  Advanced Directives  Does Patient Have a Medical Advance Directive? Yes  Type of Estate agent of [place];Living will  Does patient want to make changes to medical advance directive? No - Patient declined     Chief Complaint  Patient presents with   Acute Visit    Cough and Chest Congestion.      HPI: Patient is a 73 y.o. female seen today for an acute visit for   Past Medical History:  Diagnosis Date   Anemia    Arthritis    oa left shoulder   Back pain    buldging disc   Blood clot of neck vein    Breast cancer (HCC) 10/17/2014   lower inner quadrant of the right breast   Cancer (HCC)    colon   Constipation    Depression    takes Wellbutrin daily   Dizziness    r/t side effects from meds   Family history of adverse reaction to anesthesia    It is hard for my mother to wake up from anesthesia.   Generalized headaches    due to allergies, sinus   GERD (gastroesophageal reflux disease)    takes Protonix as needed   Hemorrhoids    History of bronchitis    last time about 6-53yrs ago   History of colon polyps    History of hiatal hernia    History of migraine    last one about 15+yrs ago   History of UTI    Hx of seasonal allergies    takes OTC allergy med nightly   Hyperlipidemia    takes Zetia and Zocor daily   Hypertension    takes Maxzide and Metoprolol daily   Hypothyroidism    takes Synthroid daily   Joint pain    Leg swelling    Mass of colon    Nasal congestion    Nausea    Personal history of chemotherapy    Personal history of radiation therapy    Pneumonia    walking about 6-69yrs ago   PONV (postoperative nausea and vomiting)    pt states she is very easy to sedate   S/P radiation therapy 03/11/2015 through 04/25/2015                                                      Right breast 4500 cGy in  25 sessions, right breast boost 1600 cGy in 8 sessions                         Thyroid disease    Tuberculosis    as little girl     Vertigo    takes Meclizine prn   Wears glasses    Wears glasses     Past Surgical History:  Procedure Laterality Date   ABDOMINAL HYSTERECTOMY     APPENDECTOMY     BREAST LUMPECTOMY Right 2016   CARPAL TUNNEL RELEASE     CESAREAN SECTION  [id number]   COLONOSCOPY     ESOPHAGOGASTRODUODENOSCOPY     EXPLORATORY LAPAROTOMY     KNEE SURGERY  1998   right -  arthroscopic   PARTIAL COLECTOMY  03/30/2012   Procedure: PARTIAL COLECTOMY;  Surgeon: Cherylynn Ridges, MD;  [facility] OR;  Service: General;  Laterality: N/A;   PORT-A-CATH REMOVAL N/A 06/25/2015   Procedure: REMOVAL PORT-A-CATH;  Surgeon: Chevis Pretty III, MD;  Location: [facility] OR;  Service: General;  Laterality: N/A;   PORTACATH PLACEMENT     RADIOACTIVE SEED GUIDED PARTIAL MASTECTOMY WITH AXILLARY SENTINEL LYMPH NODE BIOPSY Right 10/17/2014   Procedure: RADIOACTIVE SEED GUIDED PARTIAL MASTECTOMY WITH AXILLARY SENTINEL LYMPH NODE BIOPSY;  Surgeon: Chevis Pretty III, MD;  Location: [facility];  Service: General;  Laterality: Right;   TOTAL SHOULDER ARTHROPLASTY Left 03/04/2016   Procedure: LEFT TOTAL SHOULDER ARTHROPLASTY;  Surgeon: Francena Hanly, MD;  Location: [facility] OR;  Service: Orthopedics;  Laterality: Left;    No Known Allergies  Outpatient Encounter Medications as of 05/09/2023  Medication Sig   aspirin 81 MG tablet Take 81 mg by mouth daily.   atorvastatin (LIPITOR) 40 MG tablet Take 1 tablet (40 mg total) by mouth daily.   buPROPion (WELLBUTRIN XL) 150 MG 24 hr tablet Take 1 tablet (150 mg total) by mouth daily. Further refills will need an office visit as Theora Gianotti is no longer with this practice.   colchicine 0.6 MG tablet TAKE 2TABS AT FIRST SIGN OF GOUT, THEN 1TAB 1 HR LATER. TAKE 1TAB TWICE A DAY STARTING THE NEXT DAY   CRANBERRY PO Take by mouth daily.   diphenhydrAMINE  (BENADRYL) 25 MG tablet Take 25 mg by mouth daily as needed for allergies.   diphenhydrAMINE (SOMINEX) 25 MG tablet Take by mouth.   gabapentin (NEURONTIN) 100 MG capsule Take 2 capsules (200 mg total) by mouth at bedtime.   levothyroxine (SYNTHROID) 112 MCG tablet Take 1 tablet (112 mcg total) by mouth daily.   Misc Natural Products (BEET ROOT PO) Take by mouth daily.   nitroGLYCERIN (NITROSTAT) 0.4 MG SL tablet Place 1 tablet (0.4 mg total) under the tongue every 5 (five) minutes as needed for chest pain.   Prenatal Multivit-Min-Fe-FA (PRENATAL VITAMINS PO) Take 1 tablet by mouth daily.    TART CHERRY PO Take by mouth daily.   triamterene-hydrochlorothiazide (MAXZIDE-25) 37.5-25 MG tablet Take 1 tablet by mouth daily.   Facility-Administered Encounter Medications as of 05/09/2023  Medication   chlorhexidine (HIBICLENS) 4 % liquid 1 application    Review of Systems:  Review of Systems  Constitutional:  Negative for appetite change, chills, fatigue and fever.  HENT:  Negative for congestion, hearing loss, rhinorrhea and sore throat.   Eyes: Negative.   Respiratory:  Negative for cough, shortness of breath and wheezing.   Cardiovascular:  Negative for chest pain, palpitations and leg swelling.  Gastrointestinal:  Negative for abdominal pain, constipation, diarrhea, nausea and vomiting.  Genitourinary:  Negative for dysuria.  Musculoskeletal:  Negative for arthralgias, back pain and myalgias.  Skin:  Negative for color change, rash and wound.  Neurological:  Negative for dizziness, weakness and headaches.  Psychiatric/Behavioral:  Negative for behavioral problems. The patient is not nervous/anxious.     Health Maintenance  Topic Date Due   DTaP/Tdap/Td (2 - Td or Tdap) 07/19/2021   INFLUENZA VACCINE  03/17/2023   COVID-19 Vaccine (6 - 2023-24 season) 04/17/2023   MAMMOGRAM  10/14/2023   Medicare Annual Wellness (AWV)  04/04/2024   Colonoscopy  02/21/2028   Pneumonia Vaccine 38+  Years old  Completed   DEXA SCAN  Completed   Hepatitis C Screening  Completed  Zoster Vaccines- Shingrix  Completed   HPV VACCINES  Aged Out    Physical Exam: Vitals:   05/09/23 0926  BP: 118/88  Pulse: 72  Resp: 18  Temp: 97.7 F (36.5 C)  SpO2: 99%  Weight: 220 lb 3.2 oz (99.9 kg)  Height: 5\' 1"  (1.549 m)   Body mass index is 41.61 kg/m. Physical Exam Constitutional:      Appearance: Normal appearance.  HENT:     Head: Normocephalic and atraumatic.     Nose: Nose normal.     Mouth/Throat:     Mouth: Mucous membranes are moist.  Eyes:     Conjunctiva/sclera: Conjunctivae normal.  Cardiovascular:     Rate and Rhythm: Normal rate and regular rhythm.  Pulmonary:     Effort: Pulmonary effort is normal.     Breath sounds: Normal breath sounds.  Abdominal:     General: Bowel sounds are normal.     Palpations: Abdomen is soft.  Musculoskeletal:        General: Normal range of motion.     Cervical back: Normal range of motion.  Skin:    General: Skin is warm and dry.  Neurological:     General: No focal deficit present.     Mental Status: She is alert and oriented to person, place, and time.  Psychiatric:        Mood and Affect: Mood normal.        Behavior: Behavior normal.        Thought Content: Thought content normal.        Judgment: Judgment normal.     Labs reviewed: Basic Metabolic Panel: Recent Labs    01/27/23 1148  NA 140  K 3.9  CL 106  CO2 22  GLUCOSE 102*  BUN 13  CREATININE 0.98  CALCIUM 8.9  TSH 1.04   Liver Function Tests: Recent Labs    01/27/23 1148  AST 34  ALT 34*  BILITOT 0.9  PROT 6.6   No results for input(s): "LIPASE", "AMYLASE" in the last 8760 hours. No results for input(s): "AMMONIA" in the last 8760 hours. CBC: Recent Labs    01/27/23 1148  WBC 7.0  NEUTROABS 4,249  HGB 15.3  HCT 47.8*  MCV 89.5  PLT 255   Lipid Panel: Recent Labs    01/27/23 1148  CHOL 187  HDL 83  LDLCALC 85  TRIG 95  CHOLHDL  2.3   Lab Results  Component Value Date   HGBA1C 6.1 (H) 01/27/2023    Procedures since last visit: No results found.  Assessment/Plan    Labs/tests ordered:    Next appt:  08/02/2023

## 2023-05-10 LAB — SARS-COV-2 RNA,(COVID-19) QUALITATIVE NAAT: SARS CoV2 RNA: DETECTED — AB

## 2023-05-12 MED ORDER — NIRMATRELVIR/RITONAVIR (PAXLOVID)TABLET: 3.0000 | ORAL_TABLET | Freq: Two times a day (BID) | ORAL | 0 refills | Status: AC

## 2023-05-12 NOTE — Progress Notes (Signed)
-   COVID-19 was positive, sent e-prescription for Paxlovid. Do not take Colchicine while on Paxlovid. Pls isolate for 5 days and mask for 10 days.

## 2023-05-18 ENCOUNTER — Other Ambulatory Visit: Payer: Self-pay | Admitting: Family

## 2023-05-18 DIAGNOSIS — F331 Major depressive disorder, recurrent, moderate: Secondary | ICD-10-CM

## 2023-06-02 ENCOUNTER — Encounter: Payer: Self-pay | Admitting: Family

## 2023-06-02 ENCOUNTER — Ambulatory Visit: Payer: Medicare PPO | Admitting: Family

## 2023-06-02 VITALS — BP 130/88 | HR 85 | Temp 98.1°F | Resp 20 | Ht 61.0 in | Wt 220.2 lb

## 2023-06-02 DIAGNOSIS — E039 Hypothyroidism, unspecified: Secondary | ICD-10-CM | POA: Diagnosis not present

## 2023-06-02 DIAGNOSIS — E782 Mixed hyperlipidemia: Secondary | ICD-10-CM

## 2023-06-02 DIAGNOSIS — R7303 Prediabetes: Secondary | ICD-10-CM | POA: Diagnosis not present

## 2023-06-02 DIAGNOSIS — I10 Essential (primary) hypertension: Secondary | ICD-10-CM

## 2023-06-02 DIAGNOSIS — N6314 Unspecified lump in the right breast, lower inner quadrant: Secondary | ICD-10-CM

## 2023-06-02 NOTE — Progress Notes (Signed)
Provider: Richarda Blade FNP-C   Khari Lett, Donalee Citrin, NP  Patient Care Team: Jachin Coury, Donalee Citrin, NP as PCP - General (Family Medicine) Griselda Miner, MD as Consulting Physician (General Surgery) Magrinat, Valentino Hue, MD (Inactive) as Consulting Physician (Oncology) Chipper Herb, MD (Inactive) as Consulting Physician (Radiation Oncology) Hubbard Hartshorn, NP (Inactive) as Nurse Practitioner (Nurse Practitioner) Salomon Fick, NP as Nurse Practitioner (Nurse Practitioner) Charna Elizabeth, MD as Consulting Physician (Gastroenterology) [facility], [place]  Extended Emergency Contact Information Primary Emergency Contact: Morgan,Midasia Address: [address]          [place], [place] [address] Darden Amber of [place] Home Phone: [phone] Mobile Phone: [phone] Relation: Granddaughter  Code Status:  Full Code  Goals of care: Advanced Directive information    04/05/2023   11:35 AM  Advanced Directives  Does Patient Have a Medical Advance Directive? Yes  Type of Estate agent of [place];Living will  Does patient want to make changes to medical advance directive? No - Patient declined     Chief Complaint  Patient presents with   Medical Management of Chronic Issues    Patient presents today for a 3 weeks follow-up   Quality Metric Gaps    TDAP, COVID#6,Flu    HPI:  Pt is a 72 y.o. female seen today for 3 weeks follow up for medical management of chronic diseases.  States still coughing since last seen by Serena Colonel on 05/09/2023.He denies any headache,dizziness,vision changes,fatigue,chest tightness,palpitation,chest pain or shortness of breath.    Also complains of chest wall lump which she found in September,2024.states no tenderness.     Past Medical History:  Diagnosis Date   Anemia    Arthritis    oa left shoulder   Back pain    buldging disc   Blood clot of neck vein    Breast cancer (HCC)  10/17/2014   lower inner quadrant of the right breast   Cancer (HCC)    colon   Constipation    Depression    takes Wellbutrin daily   Dizziness    r/t side effects from meds   Family history of adverse reaction to anesthesia    It is hard for my mother to wake up from anesthesia.   Generalized headaches    due to allergies, sinus   GERD (gastroesophageal reflux disease)    takes Protonix as needed   Hemorrhoids    History of bronchitis    last time about 6-74yrs ago   History of colon polyps    History of hiatal hernia    History of migraine    last one about 15+yrs ago   History of UTI    Hx of seasonal allergies    takes OTC allergy med nightly   Hyperlipidemia    takes Zetia and Zocor daily   Hypertension    takes Maxzide and Metoprolol daily   Hypothyroidism    takes Synthroid daily   Joint pain    Leg swelling    Mass of colon    Nasal congestion    Nausea    Personal history of chemotherapy    Personal history of radiation therapy    Pneumonia    walking about 6-31yrs ago   PONV (postoperative nausea and vomiting)    pt states she is very easy to sedate   S/P radiation therapy 03/11/2015 through 04/25/2015  Right breast 4500 cGy in 25 sessions, right breast boost 1600 cGy in 8 sessions                         Thyroid disease    Tuberculosis    as little girl     Vertigo    takes Meclizine prn   Wears glasses    Wears glasses    Past Surgical History:  Procedure Laterality Date   ABDOMINAL HYSTERECTOMY     APPENDECTOMY     BREAST LUMPECTOMY Right 2016   CARPAL TUNNEL RELEASE     CESAREAN SECTION  1610,9604   COLONOSCOPY     ESOPHAGOGASTRODUODENOSCOPY     EXPLORATORY LAPAROTOMY     KNEE SURGERY  1998   right - arthroscopic   PARTIAL COLECTOMY  03/30/2012   Procedure: PARTIAL COLECTOMY;  Surgeon: Cherylynn Ridges, MD;  Location: [facility];  Service: General;  Laterality: N/A;   PORT-A-CATH REMOVAL N/A  06/25/2015   Procedure: REMOVAL PORT-A-CATH;  Surgeon: Chevis Pretty III, MD;  Location: [facility];  Service: General;  Laterality: N/A;   PORTACATH PLACEMENT     RADIOACTIVE SEED GUIDED PARTIAL MASTECTOMY WITH AXILLARY SENTINEL LYMPH NODE BIOPSY Right 10/17/2014   Procedure: RADIOACTIVE SEED GUIDED PARTIAL MASTECTOMY WITH AXILLARY SENTINEL LYMPH NODE BIOPSY;  Surgeon: Chevis Pretty III, MD;  Location: [facility];  Service: General;  Laterality: Right;   TOTAL SHOULDER ARTHROPLASTY Left 03/04/2016   Procedure: LEFT TOTAL SHOULDER ARTHROPLASTY;  Surgeon: Francena Hanly, MD;  Location: [facility];  Service: Orthopedics;  Laterality: Left;    No Known Allergies  Allergies as of 06/02/2023   No Known Allergies      Medication List        Accurate as of June 02, 2023 11:39 AM. If you have any questions, ask your nurse or doctor.          aspirin 81 MG tablet Take 81 mg by mouth daily.   atorvastatin 40 MG tablet Commonly known as: LIPITOR Take 1 tablet (40 mg total) by mouth daily.   BEET ROOT PO Take by mouth daily.   buPROPion 150 MG 24 hr tablet Commonly known as: WELLBUTRIN XL Take 1 tablet (150 mg total) by mouth daily.   colchicine 0.6 MG tablet TAKE 2TABS AT FIRST SIGN OF GOUT, THEN 1TAB 1 HR LATER. TAKE 1TAB TWICE A DAY STARTING THE NEXT DAY   CRANBERRY PO Take by mouth daily.   diphenhydrAMINE 25 MG tablet Commonly known as: BENADRYL Take 25 mg by mouth daily as needed for allergies.   diphenhydrAMINE 25 MG tablet Commonly known as: SOMINEX Take by mouth.   gabapentin 100 MG capsule Commonly known as: NEURONTIN Take 2 capsules (200 mg total) by mouth at bedtime.   levothyroxine 112 MCG tablet Commonly known as: SYNTHROID Take 1 tablet (112 mcg total) by mouth daily.   loperamide 2 MG tablet Commonly known as: Imodium A-D Take 1 tablet (2 mg total) by mouth 4 (four) times daily as needed for diarrhea or loose stools.   nitroGLYCERIN 0.4 MG SL  tablet Commonly known as: NITROSTAT Place 1 tablet (0.4 mg total) under the tongue every 5 (five) minutes as needed for chest pain.   PRENATAL VITAMINS PO Take 1 tablet by mouth daily.   TART CHERRY PO Take by mouth daily.   triamterene-hydrochlorothiazide 37.5-25 MG tablet Commonly known as: MAXZIDE-25 Take 1 tablet by mouth daily.  Review of Systems  Immunization History  Administered Date(s) Administered   Fluad Quad(high Dose 65+) 05/03/2018, 06/04/2020, 07/01/2021, 07/20/2022   Influenza Split 05/06/2011, 06/20/2012   Influenza, High Dose Seasonal PF 05/03/2018, 06/06/2019   Influenza,inj,Quad PF,6+ Mos 06/07/2013, 08/20/2014, 06/05/2015, 06/22/2016, 04/27/2017   Influenza,trivalent, recombinat, inj, PF 05/06/2011, 06/20/2012   Influenza-Unspecified 06/07/2013, 08/20/2014, 06/05/2015, 06/22/2016, 04/27/2017, 05/03/2018, 06/06/2019   PFIZER Comirnaty(Gray Top)Covid-19 Tri-Sucrose Vaccine 03/31/2021   PFIZER(Purple Top)SARS-COV-2 Vaccination 09/28/2019, 10/23/2019, 09/12/2020   Pfizer(Comirnaty)Fall Seasonal Vaccine 12 years and older 11/18/2022   Pneumococcal Conjugate-13 09/16/2015   Pneumococcal Polysaccharide-23 11/06/2013   Pneumococcal-Unspecified 11/06/2013   Polio, Unspecified 03/23/1955, 04/06/1955, 11/11/1955, 03/17/1960   Tdap 07/20/2011   Zoster Recombinant(Shingrix) 03/21/2020, 01/27/2023   Zoster, Live 08/17/2011   Pertinent  Health Maintenance Due  Topic Date Due   INFLUENZA VACCINE  03/17/2023   MAMMOGRAM  10/14/2023   Colonoscopy  02/21/2028   DEXA SCAN  Completed      05/30/2017   11:27 AM 06/20/2017   11:11 AM 09/29/2017   10:17 AM 11/02/2017   11:30 AM 01/27/2023   10:40 AM  Fall Risk  Falls in the past year? No No No No 0  Was there an injury with Fall?     0  Fall Risk Category Calculator     0  Patient at Risk for Falls Due to     No Fall Risks  Fall risk Follow up     Falls evaluation completed   Functional Status Survey:     Vitals:   06/02/23 1058  BP: 130/88  Pulse: 85  Resp: 20  Temp: 98.1 F (36.7 C)  SpO2: 99%  Weight: 220 lb 3.2 oz (99.9 kg)  Height: 5\' 1"  (1.549 m)   Body mass index is 41.61 kg/m. Physical Exam Vitals reviewed.  Constitutional:      General: She is not in acute distress.    Appearance: Normal appearance. She is obese. She is not ill-appearing or diaphoretic.  HENT:     Head: Normocephalic.     Right Ear: Tympanic membrane, ear canal and external ear normal. There is no impacted cerumen.     Left Ear: Tympanic membrane, ear canal and external ear normal. There is no impacted cerumen.     Nose: Nose normal. No congestion or rhinorrhea.     Mouth/Throat:     Mouth: Mucous membranes are moist.     Pharynx: Oropharynx is clear. No oropharyngeal exudate or posterior oropharyngeal erythema.  Eyes:     General: No scleral icterus.       Right eye: No discharge.        Left eye: No discharge.     Extraocular Movements: Extraocular movements intact.     Conjunctiva/sclera: Conjunctivae normal.     Pupils: Pupils are equal, round, and reactive to light.  Neck:     Vascular: No carotid bruit.  Cardiovascular:     Rate and Rhythm: Normal rate and regular rhythm.  Pulmonary:     Effort: Pulmonary effort is normal. No respiratory distress.     Breath sounds: Normal breath sounds. No wheezing, rhonchi or rales.  Chest:     Chest wall: No tenderness.  Abdominal:     General: Bowel sounds are normal. There is no distension.     Palpations: Abdomen is soft. There is no mass.     Tenderness: There is no abdominal tenderness. There is no right CVA tenderness, left CVA tenderness, guarding or rebound.  Musculoskeletal:  General: No swelling or tenderness. Normal range of motion.     Cervical back: Normal range of motion. No rigidity or tenderness.     Right lower leg: No edema.     Left lower leg: No edema.  Lymphadenopathy:     Cervical: No cervical adenopathy.  Skin:     General: Skin is warm and dry.     Coloration: Skin is not pale.     Findings: No bruising, erythema, lesion or rash.  Neurological:     Mental Status: She is alert and oriented to person, place, and time.     Cranial Nerves: No cranial nerve deficit.     Sensory: No sensory deficit.     Motor: No weakness.     Coordination: Coordination normal.     Gait: Gait normal.  Psychiatric:        Mood and Affect: Mood normal.        Speech: Speech normal.        Behavior: Behavior normal.        Thought Content: Thought content normal.        Judgment: Judgment normal.     Labs reviewed: Recent Labs    01/27/23 1148  NA 140  K 3.9  CL 106  CO2 22  GLUCOSE 102*  BUN 13  CREATININE 0.98  CALCIUM 8.9   Recent Labs    01/27/23 1148  AST 34  ALT 34*  BILITOT 0.9  PROT 6.6   Recent Labs    01/27/23 1148  WBC 7.0  NEUTROABS 4,249  HGB 15.3  HCT 47.8*  MCV 89.5  PLT 255   Lab Results  Component Value Date   TSH 1.04 01/27/2023   Lab Results  Component Value Date   HGBA1C 6.1 (H) 01/27/2023   Lab Results  Component Value Date   CHOL 187 01/27/2023   HDL 83 01/27/2023   LDLCALC 85 01/27/2023   TRIG 95 01/27/2023   CHOLHDL 2.3 01/27/2023    Significant Diagnostic Results in last 30 days:  No results found.  Assessment/Plan 1. Essential hypertension Blood pressure well-controlled -Continue on Triamterene - Hydrochlorothiazide -Lifestyle modification and exercise advised - COMPLETE METABOLIC PANEL WITH GFR - CBC with Differential/Platelet  2. Acquired hypothyroidism Lab Results  Component Value Date   TSH 1.04 01/27/2023  -Continue on level thyroxine - TSH  3. Mixed hyperlipidemia LDL at goal -Continue on atorvastatin -Continue dietary modification and exercise - Lipid panel  4. Prediabetes Lab Results  Component Value Date   HGBA1C 6.1 (H) 01/27/2023  -Dietary modification and exercise as above - Hemoglobin A1c  5. Mass of lower inner  quadrant of right breast Palpable mass on lower inner quadrant noted - MM Digital Diagnostic Unilat R   Family/ staff Communication: Reviewed plan of care with patient verbalized understanding  Labs/tests ordered:   COMPLETE METABOLIC PANEL WITH GFR - CBC with Differential/Platelet - Hemoglobin A1c - MM Digital Diagnostic Unilat R  Next Appointment : Return in about 6 months (around 12/01/2023) for medical mangement of chronic issues.Caesar Bookman, NP

## 2023-06-03 ENCOUNTER — Other Ambulatory Visit: Payer: Self-pay | Admitting: Family

## 2023-06-03 DIAGNOSIS — F331 Major depressive disorder, recurrent, moderate: Secondary | ICD-10-CM

## 2023-06-03 DIAGNOSIS — E039 Hypothyroidism, unspecified: Secondary | ICD-10-CM

## 2023-06-03 LAB — COMPLETE METABOLIC PANEL WITH GFR
AG Ratio: 1.3 (calc) (ref 1.0–2.5)
ALT: 29 U/L (ref 6–29)
AST: 28 U/L (ref 10–35)
Albumin: 3.6 g/dL (ref 3.6–5.1)
Alkaline phosphatase (APISO): 52 U/L (ref 37–153)
BUN: 12 mg/dL (ref 7–25)
CO2: 29 mmol/L (ref 20–32)
Calcium: 8.8 mg/dL (ref 8.6–10.4)
Chloride: 106 mmol/L (ref 98–110)
Creat: 0.89 mg/dL (ref 0.60–1.00)
Globulin: 2.8 g/dL (ref 1.9–3.7)
Glucose, Bld: 98 mg/dL (ref 65–99)
Potassium: 3.6 mmol/L (ref 3.5–5.3)
Sodium: 144 mmol/L (ref 135–146)
Total Bilirubin: 0.8 mg/dL (ref 0.2–1.2)
Total Protein: 6.4 g/dL (ref 6.1–8.1)
eGFR: 68 mL/min/{1.73_m2} (ref >=60)

## 2023-06-03 LAB — LIPID PANEL
Cholesterol: 161 mg/dL (ref <200)
HDL: 78 mg/dL (ref >=50)
LDL Cholesterol (Calc): 68 mg/dL
Non-HDL Cholesterol (Calc): 83 mg/dL (ref <130)
Total CHOL/HDL Ratio: 2.1 (calc) (ref <5.0)
Triglycerides: 74 mg/dL (ref <150)

## 2023-06-03 LAB — CBC WITH DIFFERENTIAL/PLATELET
Absolute Lymphocytes: 2117 {cells}/uL (ref 850–3900)
Absolute Monocytes: 460 {cells}/uL (ref 200–950)
Basophils Absolute: 38 {cells}/uL (ref 0–200)
Basophils Relative: 0.6 %
Eosinophils Absolute: 202 {cells}/uL (ref 15–500)
Eosinophils Relative: 3.2 %
HCT: 46.1 % — ABNORMAL HIGH (ref 35.0–45.0)
Hemoglobin: 15 g/dL (ref 11.7–15.5)
MCH: 28.7 pg (ref 27.0–33.0)
MCHC: 32.5 g/dL (ref 32.0–36.0)
MCV: 88.3 fL (ref 80.0–100.0)
MPV: 10.2 fL (ref 7.5–12.5)
Monocytes Relative: 7.3 %
Neutro Abs: 3484 {cells}/uL (ref 1500–7800)
Neutrophils Relative %: 55.3 %
Platelets: 245 10*3/uL (ref 140–400)
RBC: 5.22 10*6/uL — ABNORMAL HIGH (ref 3.80–5.10)
RDW: 14 % (ref 11.0–15.0)
Total Lymphocyte: 33.6 %
WBC: 6.3 10*3/uL (ref 3.8–10.8)

## 2023-06-03 LAB — HEMOGLOBIN A1C
Hgb A1c MFr Bld: 6.1 %{Hb} — ABNORMAL HIGH (ref <5.7)
Mean Plasma Glucose: 128 mg/dL
eAG (mmol/L): 7.1 mmol/L

## 2023-06-03 LAB — TSH: TSH: 0.49 m[IU]/L (ref 0.40–4.50)

## 2023-06-03 NOTE — Telephone Encounter (Signed)
Patient has request refill on medication that was just refilled.

## 2023-06-15 ENCOUNTER — Other Ambulatory Visit: Payer: Self-pay

## 2023-06-15 ENCOUNTER — Telehealth: Payer: Self-pay

## 2023-06-15 DIAGNOSIS — N6314 Unspecified lump in the right breast, lower inner quadrant: Secondary | ICD-10-CM

## 2023-06-15 NOTE — Telephone Encounter (Signed)
Incoming fax received from [facility] requesting order for bilateral diagnostic mammogram and ultrasound of right breast.   Orders placed with cosign required status

## 2023-06-21 LAB — HM MAMMOGRAPHY

## 2023-07-13 ENCOUNTER — Ambulatory Visit: Payer: Medicare PPO | Admitting: Family

## 2023-07-13 ENCOUNTER — Encounter: Payer: Self-pay | Admitting: Family

## 2023-07-13 VITALS — BP 127/78 | HR 87 | Temp 98.4°F | Resp 18 | Ht 61.0 in | Wt 217.6 lb

## 2023-07-13 DIAGNOSIS — N6314 Unspecified lump in the right breast, lower inner quadrant: Secondary | ICD-10-CM

## 2023-07-13 NOTE — Progress Notes (Signed)
Provider: Richarda Blade FNP-C  Gaither Biehn, Donalee Citrin, NP  Patient Care Team: Lainee Lehrman, Donalee Citrin, NP as PCP - General (Family Medicine) Griselda Miner, MD as Consulting Physician (General Surgery) Magrinat, Valentino Hue, MD (Inactive) as Consulting Physician (Oncology) Chipper Herb, MD (Inactive) as Consulting Physician (Radiation Oncology) Hubbard Hartshorn, NP (Inactive) as Nurse Practitioner (Nurse Practitioner) Salomon Fick, NP as Nurse Practitioner (Nurse Practitioner) Charna Elizabeth, MD as Consulting Physician (Gastroenterology) [facility], [place]  Extended Emergency Contact Information Primary Emergency Contact: Zillmer,Midasia Address: [address]          [place], [place] [address] [place] of [place] Home Phone: [phone] Mobile Phone: [phone] Relation: Granddaughter  Code Status:  Full Code  Goals of care: Advanced Directive information    04/05/2023   11:35 AM  Advanced Directives  Does Patient Have a Medical Advance Directive? Yes  Type of Estate agent of [place];Living will  Does patient want to make changes to medical advance directive? No - Patient declined     Chief Complaint  Patient presents with   Medical Management of Chronic Issues    Needs referral before her surgery      HPI:  Pt is a 72 y.o. female seen today for an acute visit for referral to [facility]  for evaluation on right breast lump.states would like a second opinion.she request referral to Dr.Toth states already a patient there but had already booked this appointment. States mammogram did not indicate lump on the breast. Site continue to be tender to touch but no swelling.Ease to locate when lying down than when seated.    Past Medical History:  Diagnosis Date   Anemia    Arthritis    oa left shoulder   Back pain    buldging disc   Blood clot of neck vein    Breast cancer (HCC) 10/17/2014   lower inner  quadrant of the right breast   Cancer (HCC)    colon   Constipation    Depression    takes Wellbutrin daily   Dizziness    r/t side effects from meds   Family history of adverse reaction to anesthesia    It is hard for my mother to wake up from anesthesia.   Generalized headaches    due to allergies, sinus   GERD (gastroesophageal reflux disease)    takes Protonix as needed   Hemorrhoids    History of bronchitis    last time about 6-70yrs ago   History of colon polyps    History of hiatal hernia    History of migraine    last one about 15+yrs ago   History of UTI    Hx of seasonal allergies    takes OTC allergy med nightly   Hyperlipidemia    takes Zetia and Zocor daily   Hypertension    takes Maxzide and Metoprolol daily   Hypothyroidism    takes Synthroid daily   Joint pain    Leg swelling    Mass of colon    Nasal congestion    Nausea    Personal history of chemotherapy    Personal history of radiation therapy    Pneumonia    walking about 6-48yrs ago   PONV (postoperative nausea and vomiting)    pt states she is very easy to sedate   S/P radiation therapy 03/11/2015 through 04/25/2015  Right breast 4500 cGy in 25 sessions, right breast boost 1600 cGy in 8 sessions                         Thyroid disease    Tuberculosis    as little girl     Vertigo    takes Meclizine prn   Wears glasses    Wears glasses    Past Surgical History:  Procedure Laterality Date   ABDOMINAL HYSTERECTOMY     APPENDECTOMY     BREAST LUMPECTOMY Right 2016   CARPAL TUNNEL RELEASE     CESAREAN SECTION  [id number]   COLONOSCOPY     ESOPHAGOGASTRODUODENOSCOPY     EXPLORATORY LAPAROTOMY     KNEE SURGERY  1998   right - arthroscopic   PARTIAL COLECTOMY  03/30/2012   Procedure: PARTIAL COLECTOMY;  Surgeon: Cherylynn Ridges, MD;  Location: [facility];  Service: General;  Laterality: N/A;   PORT-A-CATH REMOVAL N/A 06/25/2015   Procedure:  REMOVAL PORT-A-CATH;  Surgeon: Chevis Pretty III, MD;  Location: [facility];  Service: General;  Laterality: N/A;   PORTACATH PLACEMENT     RADIOACTIVE SEED GUIDED PARTIAL MASTECTOMY WITH AXILLARY SENTINEL LYMPH NODE BIOPSY Right 10/17/2014   Procedure: RADIOACTIVE SEED GUIDED PARTIAL MASTECTOMY WITH AXILLARY SENTINEL LYMPH NODE BIOPSY;  Surgeon: Chevis Pretty III, MD;  Location: [facility];  Service: General;  Laterality: Right;   TOTAL SHOULDER ARTHROPLASTY Left 03/04/2016   Procedure: LEFT TOTAL SHOULDER ARTHROPLASTY;  Surgeon: Francena Hanly, MD;  Location: [facility];  Service: Orthopedics;  Laterality: Left;    No Known Allergies  Outpatient Encounter Medications as of 07/13/2023  Medication Sig   aspirin 81 MG tablet Take 81 mg by mouth daily.   atorvastatin (LIPITOR) 40 MG tablet Take 1 tablet (40 mg total) by mouth daily.   buPROPion (WELLBUTRIN XL) 150 MG 24 hr tablet TAKE 1 TABLET BY MOUTH EVERY DAY   colchicine 0.6 MG tablet TAKE 2TABS AT FIRST SIGN OF GOUT, THEN 1TAB 1 HR LATER. TAKE 1TAB TWICE A DAY STARTING THE NEXT DAY   CRANBERRY PO Take by mouth daily.   diphenhydrAMINE (BENADRYL) 25 MG tablet Take 25 mg by mouth daily as needed for allergies.   diphenhydrAMINE (SOMINEX) 25 MG tablet Take by mouth.   gabapentin (NEURONTIN) 100 MG capsule Take 2 capsules (200 mg total) by mouth at bedtime.   levothyroxine (SYNTHROID) 112 MCG tablet TAKE 1 TABLET BY MOUTH EVERY DAY   loperamide (IMODIUM A-D) 2 MG tablet Take 1 tablet (2 mg total) by mouth 4 (four) times daily as needed for diarrhea or loose stools.   Misc Natural Products (BEET ROOT PO) Take by mouth daily.   nitroGLYCERIN (NITROSTAT) 0.4 MG SL tablet Place 1 tablet (0.4 mg total) under the tongue every 5 (five) minutes as needed for chest pain.   Prenatal Multivit-Min-Fe-FA (PRENATAL VITAMINS PO) Take 1 tablet by mouth daily.    TART CHERRY PO Take by mouth daily.   triamterene-hydrochlorothiazide (MAXZIDE-25) 37.5-25 MG tablet  Take 1 tablet by mouth daily.   Facility-Administered Encounter Medications as of 07/13/2023  Medication   chlorhexidine (HIBICLENS) 4 % liquid 1 application    Review of Systems  Constitutional:  Negative for appetite change, chills, fatigue, fever and unexpected weight change.  Respiratory:  Negative for cough, chest tightness, shortness of breath and wheezing.   Cardiovascular:  Negative for chest pain, palpitations and leg swelling.  Musculoskeletal:  Positive for  gait problem. Negative for arthralgias, back pain, joint swelling, myalgias, neck pain and neck stiffness.  Skin:  Negative for color change, pallor, rash and wound.       Right breast lump per HPI   Neurological:  Negative for dizziness, light-headedness, numbness and headaches.    Immunization History  Administered Date(s) Administered   Fluad Quad(high Dose 65+) 05/03/2018, 06/04/2020, 07/01/2021, 07/20/2022   Influenza Split 05/06/2011, 06/20/2012   Influenza, High Dose Seasonal PF 05/03/2018, 06/06/2019   Influenza,inj,Quad PF,6+ Mos 06/07/2013, 08/20/2014, 06/05/2015, 06/22/2016, 04/27/2017   Influenza,trivalent, recombinat, inj, PF 05/06/2011, 06/20/2012   Influenza-Unspecified 06/07/2013, 08/20/2014, 06/05/2015, 06/22/2016, 04/27/2017, 05/03/2018, 06/06/2019   PFIZER Comirnaty(Gray Top)Covid-19 Tri-Sucrose Vaccine 03/31/2021   PFIZER(Purple Top)SARS-COV-2 Vaccination 09/28/2019, 10/23/2019, 09/12/2020   Pfizer(Comirnaty)Fall Seasonal Vaccine 12 years and older 11/18/2022   Pneumococcal Conjugate-13 09/16/2015   Pneumococcal Polysaccharide-23 11/06/2013   Pneumococcal-Unspecified 11/06/2013   Polio, Unspecified 03/23/1955, 04/06/1955, 11/11/1955, 03/17/1960   Tdap 07/20/2011   Zoster Recombinant(Shingrix) 03/21/2020, 01/27/2023   Zoster, Live 08/17/2011   Pertinent  Health Maintenance Due  Topic Date Due   INFLUENZA VACCINE  03/17/2023   MAMMOGRAM  06/20/2025   Colonoscopy  02/21/2028   DEXA SCAN   Completed      06/20/2017   11:11 AM 09/29/2017   10:17 AM 11/02/2017   11:30 AM 01/27/2023   10:40 AM 07/13/2023    9:26 AM  Fall Risk  Falls in the past year? No No No 0 0  Was there an injury with Fall?    0 0  Fall Risk Category Calculator    0 0  Patient at Risk for Falls Due to    No Fall Risks No Fall Risks  Fall risk Follow up    Falls evaluation completed Falls evaluation completed   Functional Status Survey:    Vitals:   07/13/23 0927  BP: 127/78  Pulse: 87  Resp: 18  Temp: 98.4 F (36.9 C)  SpO2: 97%  Weight: 217 lb 9.6 oz (98.7 kg)  Height: 5\' 1"  (1.549 m)   Body mass index is 41.12 kg/m. Physical Exam Vitals reviewed.  Constitutional:      General: She is not in acute distress.    Appearance: Normal appearance. She is obese. She is not ill-appearing or diaphoretic.  Cardiovascular:     Rate and Rhythm: Normal rate and regular rhythm.     Pulses: Normal pulses.     Heart sounds: Normal heart sounds. No murmur heard.    No friction rub. No gallop.  Pulmonary:     Effort: Pulmonary effort is normal. No respiratory distress.     Breath sounds: Normal breath sounds. No wheezing, rhonchi or rales.  Chest:     Chest wall: No tenderness.  Musculoskeletal:        General: No swelling or tenderness. Normal range of motion.     Right lower leg: No edema.     Left lower leg: No edema.  Skin:    General: Skin is warm and dry.     Coloration: Skin is not pale.     Findings: No bruising, erythema, lesion or rash.     Comments: Right lower inner quadrant tender to touch but no erythema or drainage  Neurological:     Mental Status: She is alert and oriented to person, place, and time.     Cranial Nerves: No cranial nerve deficit.     Sensory: No sensory deficit.     Motor: No weakness.  Coordination: Coordination normal.     Gait: Gait abnormal.  Psychiatric:        Mood and Affect: Mood normal.        Speech: Speech normal.        Behavior: Behavior  normal.    Labs reviewed: Recent Labs    01/27/23 1148 06/02/23 1158  NA 140 144  K 3.9 3.6  CL 106 106  CO2 22 29  GLUCOSE 102* 98  BUN 13 12  CREATININE 0.98 0.89  CALCIUM 8.9 8.8   Recent Labs    01/27/23 1148 06/02/23 1158  AST 34 28  ALT 34* 29  BILITOT 0.9 0.8  PROT 6.6 6.4   Recent Labs    01/27/23 1148 06/02/23 1158  WBC 7.0 6.3  NEUTROABS 4,249 3,484  HGB 15.3 15.0  HCT 47.8* 46.1*  MCV 89.5 88.3  PLT 255 245   Lab Results  Component Value Date   TSH 0.49 06/02/2023   Lab Results  Component Value Date   HGBA1C 6.1 (H) 06/02/2023   Lab Results  Component Value Date   CHOL 161 06/02/2023   HDL 78 06/02/2023   LDLCALC 68 06/02/2023   TRIG 74 06/02/2023   CHOLHDL 2.1 06/02/2023    Significant Diagnostic Results in last 30 days:  No results found.  Assessment/Plan  Mass of lower inner quadrant of right breast Right lower inner quadrant tender to touch but no erythema or drainage Diagnostic mammogram ordered but was told not breast related.Previous mammogram was normal.would like second opion Request referral to DR.Carolynne Edouard at [facility]  - Ambulatory referral to General Surgery  Family/ staff Communication: Reviewed plan of care with patient verbalized understanding  Labs/tests ordered: None   Next Appointment: Return if symptoms worsen or fail to improve.   Caesar Bookman, NP

## 2023-07-26 ENCOUNTER — Ambulatory Visit (INDEPENDENT_AMBULATORY_CARE_PROVIDER_SITE_OTHER): Payer: Medicare PPO

## 2023-07-26 ENCOUNTER — Encounter (HOSPITAL_COMMUNITY): Payer: Self-pay

## 2023-07-26 ENCOUNTER — Ambulatory Visit (HOSPITAL_COMMUNITY)
Admission: EM | Admit: 2023-07-26 | Discharge: 2023-07-26 | Disposition: A | Discharge status: Home / Self Care | Payer: Medicare PPO | Attending: Family Medicine | Admitting: Family Medicine

## 2023-07-26 DIAGNOSIS — M19042 Primary osteoarthritis, left hand: Secondary | ICD-10-CM | POA: Diagnosis not present

## 2023-07-26 DIAGNOSIS — L02512 Cutaneous abscess of left hand: Secondary | ICD-10-CM | POA: Diagnosis not present

## 2023-07-26 DIAGNOSIS — M7989 Other specified soft tissue disorders: Secondary | ICD-10-CM | POA: Diagnosis not present

## 2023-07-26 LAB — COMPREHENSIVE METABOLIC PANEL
ALT: 53 U/L — ABNORMAL HIGH (ref 0–44)
AST: 49 U/L — ABNORMAL HIGH (ref 15–41)
Albumin: 3.1 g/dL — ABNORMAL LOW (ref 3.5–5.0)
Alkaline Phosphatase: 60 U/L (ref 38–126)
Anion gap: 14 (ref 5–15)
BUN: 13 mg/dL (ref 8–23)
CO2: 20 mmol/L — ABNORMAL LOW (ref 22–32)
Calcium: 8.9 mg/dL (ref 8.9–10.3)
Chloride: 107 mmol/L (ref 98–111)
Creatinine, Ser: 0.93 mg/dL (ref 0.44–1.00)
GFR, Estimated: >60 mL/min (ref >60)
Glucose, Bld: 81 mg/dL (ref 70–99)
Potassium: 4.2 mmol/L (ref 3.5–5.1)
Sodium: 141 mmol/L (ref 135–145)
Total Bilirubin: 0.9 mg/dL (ref <1.2)
Total Protein: 6.9 g/dL (ref 6.5–8.1)

## 2023-07-26 LAB — CBC WITH DIFFERENTIAL/PLATELET
Abs Immature Granulocytes: 0.03 10*3/uL (ref 0.00–0.07)
Basophils Absolute: 0.1 10*3/uL (ref 0.0–0.1)
Basophils Relative: 1 %
Eosinophils Absolute: 0.2 10*3/uL (ref 0.0–0.5)
Eosinophils Relative: 2 %
HCT: 45.5 % (ref 36.0–46.0)
Hemoglobin: 15 g/dL (ref 12.0–15.0)
Immature Granulocytes: 0 %
Lymphocytes Relative: 26 %
Lymphs Abs: 2.3 10*3/uL (ref 0.7–4.0)
MCH: 28.7 pg (ref 26.0–34.0)
MCHC: 33 g/dL (ref 30.0–36.0)
MCV: 87.2 fL (ref 80.0–100.0)
Monocytes Absolute: 0.5 10*3/uL (ref 0.1–1.0)
Monocytes Relative: 6 %
Neutro Abs: 5.6 10*3/uL (ref 1.7–7.7)
Neutrophils Relative %: 65 %
Platelets: 443 10*3/uL — ABNORMAL HIGH (ref 150–400)
RBC: 5.22 MIL/uL — ABNORMAL HIGH (ref 3.87–5.11)
RDW: 14.5 % (ref 11.5–15.5)
WBC: 8.6 10*3/uL (ref 4.0–10.5)
nRBC: 0 % (ref 0.0–0.2)

## 2023-07-26 LAB — C-REACTIVE PROTEIN: CRP: 0.7 mg/dL (ref <1.0)

## 2023-07-26 MED ORDER — LIDOCAINE HCL (PF) 1 % IJ SOLN
INTRAMUSCULAR | Status: AC
  Filled 2023-07-26: qty 2

## 2023-07-26 MED ORDER — CEFTRIAXONE SODIUM 1 G IJ SOLR
1.0000 g | Freq: Once | INTRAMUSCULAR | Status: AC
  Administered 2023-07-26: 1 g via INTRAMUSCULAR

## 2023-07-26 MED ORDER — CEFTRIAXONE SODIUM 1 G IJ SOLR
INTRAMUSCULAR | Status: AC
  Filled 2023-07-26: qty 10

## 2023-07-26 MED ORDER — CEPHALEXIN 500 MG PO CAPS: 500.0000 mg | ORAL_CAPSULE | Freq: Four times a day (QID) | ORAL | 0 refills | Status: AC

## 2023-07-26 NOTE — Discharge Instructions (Signed)
You were seen today for infection of the hand/finger.  This was opened and drained today.  I recommend you continue to use warm epson salt soaks to the finger to help draw this out further.  I have given you a shot of an antibiotic today, and sent out an oral antibiotic to your pharmacy.  If your symptoms are not improving in the next 24-48 hrs, or noticing red streaking up the arm then please go to the ER for further testing.  Your blood work should be resulted later today and you will be notified if there is anything concerning.

## 2023-07-26 NOTE — ED Triage Notes (Signed)
Patient here today with c/o left hand swelling since 07/13/2023 while she was cooking for thanksgiving. She noticed a spot on her the tip of her left thumb last night that looks discolored and possibly infected.

## 2023-07-26 NOTE — ED Provider Notes (Addendum)
[facility]    CSN: [id number] Arrival date & time: 07/26/23  0800      History   Chief Complaint Chief Complaint  Patient presents with   Hand Pain    HPI Jordan [name] is a 72 y.o. female.    Hand Pain  Patient is here for left hand swelling.  It started about 2 weeks ago.  The whole hand and fingers were swollen.  This started after making the meal for thanksgiving. Does not recall any injury to the hand/fingers at that time.  Had a lot of pain, taking arthritis medications, and gabapentin.   The swelling did start going down, but still present,  at the finger, hand, and up the wrist a bit.  No fevers/chills.  The pain changed a bit.  She looked, and noted there was a spot at the tip of the left thumb, discolored, possibly infected.  She does have h/o gout, normally at her ankles/feet, but has gotten it at the right middle finger.  This does not feel similar to when she gets gout.  She did take colchicine, but it did not help at all.         Past Medical History:  Diagnosis Date   Anemia    Arthritis    oa left shoulder   Back pain    buldging disc   Blood clot of neck vein    Breast cancer (HCC) 10/17/2014   lower inner quadrant of the right breast   Cancer (HCC)    colon   Constipation    Depression    takes Wellbutrin daily   Dizziness    r/t side effects from meds   Family history of adverse reaction to anesthesia    It is hard for my mother to wake up from anesthesia.   Generalized headaches    due to allergies, sinus   GERD (gastroesophageal reflux disease)    takes Protonix as needed   Hemorrhoids    History of bronchitis    last time about 6-72yrs ago   History of colon polyps    History of hiatal hernia    History of migraine    last one about 15+yrs ago   History of UTI    Hx of seasonal allergies    takes OTC allergy med nightly   Hyperlipidemia    takes Zetia and Zocor daily   Hypertension    takes Maxzide  and Metoprolol daily   Hypothyroidism    takes Synthroid daily   Joint pain    Leg swelling    Mass of colon    Nasal congestion    Nausea    Personal history of chemotherapy    Personal history of radiation therapy    Pneumonia    walking about 6-9yrs ago   PONV (postoperative nausea and vomiting)    pt states she is very easy to sedate   S/P radiation therapy 03/11/2015 through 04/25/2015                                                      Right breast 4500 cGy in 25 sessions, right breast boost 1600 cGy in 8 sessions  Thyroid disease    Tuberculosis    as little girl     Vertigo    takes Meclizine prn   Wears glasses    Wears glasses     Patient Active Problem List   Diagnosis Date Noted   Prediabetes 01/27/2023   DDD (degenerative disc disease), lumbar 04/25/2020   Chest pain 09/04/2019   Pain in joint involving multiple sites 11/01/2018   Poor venous access 03/01/2018   Leukocytosis 03/01/2018   Lymphedema of right upper extremity 12/07/2016   BMI 40.0-44.9, adult (HCC) 06/22/2016   S/P shoulder replacement 03/04/2016   Constipation 12/16/2014   Chemotherapy-induced nausea 12/16/2014   Hyperglycemia 12/09/2014   Internal jugular vein thrombosis (HCC)    DVT (deep venous thrombosis) (HCC) 12/02/2014   Osteoarthritis of both feet 11/26/2014   History of colon cancer 10/03/2014   Malignant neoplasm of lower-inner quadrant of right breast of female, estrogen receptor negative (HCC) 09/27/2014   Anemia, iron deficiency 04/25/2013   GERD (gastroesophageal reflux disease) 02/12/2013   Essential hypertension 10/21/2011   Hypothyroidism 10/21/2011   Depression 10/21/2011   Hyperlipidemia 10/21/2011   Lung nodules 10/21/2011   Vertigo 10/21/2011   Vitamin D deficiency 10/21/2011   Current moderate episode of major depressive disorder without prior episode (HCC) 10/21/2011    Past Surgical History:  Procedure Laterality Date   ABDOMINAL  HYSTERECTOMY     APPENDECTOMY     BREAST LUMPECTOMY Right 2016   CARPAL TUNNEL RELEASE     CESAREAN SECTION  4098,1191   COLONOSCOPY     ESOPHAGOGASTRODUODENOSCOPY     EXPLORATORY LAPAROTOMY     KNEE SURGERY  1998   right - arthroscopic   PARTIAL COLECTOMY  03/30/2012   Procedure: PARTIAL COLECTOMY;  Surgeon: Cherylynn Ridges, MD;  Location: [facility];  Service: General;  Laterality: N/A;   PORT-A-CATH REMOVAL N/A 06/25/2015   Procedure: REMOVAL PORT-A-CATH;  Surgeon: Chevis Pretty III, MD;  Location: [facility];  Service: General;  Laterality: N/A;   PORTACATH PLACEMENT     RADIOACTIVE SEED GUIDED PARTIAL MASTECTOMY WITH AXILLARY SENTINEL LYMPH NODE BIOPSY Right 10/17/2014   Procedure: RADIOACTIVE SEED GUIDED PARTIAL MASTECTOMY WITH AXILLARY SENTINEL LYMPH NODE BIOPSY;  Surgeon: Chevis Pretty III, MD;  Location: [facility];  Service: General;  Laterality: Right;   TOTAL SHOULDER ARTHROPLASTY Left 03/04/2016   Procedure: LEFT TOTAL SHOULDER ARTHROPLASTY;  Surgeon: Francena Hanly, MD;  Location: [facility];  Service: Orthopedics;  Laterality: Left;    OB History     Gravida  3   Para  2   Term  1   Preterm  1   AB  1   Living  1      SAB      IAB      Ectopic      Multiple      Live Births           Obstetric Comments  Menarche age 46, first live birth age 72. The patient is G3,P1.  BC ~ 3 years, She stopped having periods in 1981 when she underwent hysterectomy and unilateral salpingo-oophorectomy. Hormone replacement therapy less than 6 months.           Home Medications    Prior to Admission medications   Medication Sig Start Date End Date Taking? Authorizing Provider  aspirin 81 MG tablet Take 81 mg by mouth daily.    [provider]  atorvastatin (LIPITOR) 40 MG tablet Take 1 tablet (40  mg total) by mouth daily. 01/27/23   Ngetich, Dinah C, NP  buPROPion (WELLBUTRIN XL) 150 MG 24 hr tablet TAKE 1 TABLET BY MOUTH EVERY DAY 06/03/23   Ngetich, Dinah C, NP   colchicine 0.6 MG tablet TAKE 2TABS AT FIRST SIGN OF GOUT, THEN 1TAB 1 HR LATER. TAKE 1TAB TWICE A DAY STARTING THE NEXT DAY 01/27/23   Ngetich, Dinah C, NP  CRANBERRY PO Take by mouth daily.    [provider]  diphenhydrAMINE (BENADRYL) 25 MG tablet Take 25 mg by mouth daily as needed for allergies.    [provider]  gabapentin (NEURONTIN) 100 MG capsule Take 2 capsules (200 mg total) by mouth at bedtime. 04/05/23   Ngetich, Dinah C, NP  levothyroxine (SYNTHROID) 112 MCG tablet TAKE 1 TABLET BY MOUTH EVERY DAY 06/03/23   Ngetich, Dinah C, NP  loperamide (IMODIUM A-D) 2 MG tablet Take 1 tablet (2 mg total) by mouth 4 (four) times daily as needed for diarrhea or loose stools. 05/09/23   Medina-Vargas, Monina C, NP  Misc Natural Products (BEET ROOT PO) Take by mouth daily.    [provider]  nitroGLYCERIN (NITROSTAT) 0.4 MG SL tablet Place 1 tablet (0.4 mg total) under the tongue every 5 (five) minutes as needed for chest pain. 01/27/23   Ngetich, Dinah C, NP  TART CHERRY PO Take by mouth daily.    [provider]  triamterene-hydrochlorothiazide (MAXZIDE-25) 37.5-25 MG tablet Take 1 tablet by mouth daily. 01/27/23   Ngetich, Donalee Citrin, NP    Family History Family History  Problem Relation Age of Onset   Deep vein thrombosis Mother    Dementia Mother    Heart attack Mother    Heart attack Father    Dementia Father    Prostate cancer Father    Mental illness Sister        history of Depression   Seizures Sister 8       s/p traumatic brain injury   Lung cancer Sister    Deep vein thrombosis Sister    Prostate cancer Brother    Cancer Brother 75       stage IV colon (2012)   Prostate cancer Brother 61   Pulmonary embolism Brother        long-distance truck driver (PE x 2)   Prostate cancer Brother    Heart disease Brother    Breast cancer Maternal Grandmother 78   Cancer Maternal Grandmother 78       breast   Diabetes Maternal Grandfather    Colon  cancer Paternal Grandmother    Breast cancer Maternal Aunt 48   Cancer Maternal Aunt 48       breast   Cancer Maternal Aunt 57       unknown type   Prostate cancer Maternal Uncle     Social History Social History   Tobacco Use   Smoking status: Never   Smokeless tobacco: Never  Vaping Use   Vaping status: Never Used  Substance Use Topics   Alcohol use: Yes    Comment: RARELY   Drug use: Not Currently    Types: Marijuana     Allergies   Patient has no known allergies.   Review of Systems Review of Systems  Constitutional: Negative.   HENT: Negative.    Respiratory: Negative.    Cardiovascular: Negative.   Gastrointestinal: Negative.   Genitourinary: Negative.   Musculoskeletal:  Positive for joint swelling.  Skin:  Positive for color change.  Psychiatric/Behavioral: Negative.       Physical Exam Triage Vital Signs ED Triage Vitals  Encounter Vitals Group     BP 07/26/23 0815 120/76     Systolic BP Percentile --      Diastolic BP Percentile --      Pulse Rate 07/26/23 0815 94     Resp 07/26/23 0815 16     Temp 07/26/23 0815 98.8 F (37.1 C)     Temp Source 07/26/23 0815 Oral     SpO2 07/26/23 0815 98 %     Weight 07/26/23 0816 209 lb (94.8 kg)     Height 07/26/23 0816 5\' 1"  (1.549 m)     Head Circumference --      Peak Flow --      Pain Score 07/26/23 0817 6     Pain Loc --      Pain Education --      Exclude from Growth Chart --    No data found.  Updated Vital Signs BP 120/76 (BP Location: Right Arm)   Pulse 94   Temp 98.8 F (37.1 C) (Oral)   Resp 16   Ht 5\' 1"  (1.549 m)   Wt 94.8 kg   SpO2 98%   BMI 39.49 kg/m   Visual Acuity Right Eye Distance:   Left Eye Distance:   Bilateral Distance:    Right Eye Near:   Left Eye Near:    Bilateral Near:     Physical Exam Constitutional:      Appearance: Normal appearance.  Musculoskeletal:     Comments: The left hand and fingers are swollen;  swelling extends upward to the mid forearm.   The area is very tender, warm;  she is unable to bend her fingers due to pain/swelling.  Painful to rotate the wrist   Skin:    Comments: At the tip of the left thumb the skin is more red, warm;  there is a collection of pus under the skin, very tender  Neurological:     General: No focal deficit present.     Mental Status: She is alert.  Psychiatric:        Mood and Affect: Mood normal.      UC Treatments / Results  Labs (all labs ordered are listed, but only abnormal results are displayed) Labs Reviewed  CBC WITH DIFFERENTIAL/PLATELET - Abnormal; Notable for the following components:      Result Value   RBC 5.22 (*)    Platelets 443 (*)    All other components within normal limits  COMPREHENSIVE METABOLIC PANEL - Abnormal; Notable for the following components:   CO2 20 (*)    Albumin 3.1 (*)    AST 49 (*)    ALT 53 (*)    All other components within normal limits  C-REACTIVE PROTEIN    EKG   Radiology No results found.  Procedures Incision and Drainage  Date/Time: 07/26/2023 9:17 AM  Performed by: Jannifer Franklin, MD Authorized by: Jannifer Franklin, MD   Consent:    Consent obtained:  Verbal   Consent given by:  Patient   Risks discussed:  Bleeding, incomplete drainage and pain Location:    Type:  Abscess   Location:  Upper extremity   Upper extremity location:  Finger   Finger location:  L thumb Pre-procedure details:    Skin preparation:  Chlorhexidine with alcohol Procedure type:    Complexity:  Simple Procedure details:    Incision types:  Stab incision  Incision depth:  Dermal   Drainage characteristics: thick, white, bloody drainage.   Packing materials:  None Post-procedure details:    Procedure completion:  Tolerated Comments:     I was unable to evacuate all material due to pain/discomfort during the procedure  (including critical care time)  Medications Ordered in UC Medications  cefTRIAXone (ROCEPHIN) injection 1 g (1 g Intramuscular  Given 07/26/23 0854)    Initial Impression / Assessment and Plan / UC Course  I have reviewed the triage vital signs and the nursing notes.  Pertinent labs & imaging results that were available during my care of the patient were reviewed by me and considered in my medical decision making (see chart for details).   Patient was seen today for swelling and pain of the left hand/finger. She had an abscess/fluid collection at the tip of th thumb, which she noted yesterday.  Xray done to r/o FB, no FB seen on xray.  The area of fluid was opened and drained.  Patient did not tolerate this well, but was able to get a good amount of thick, white material.   She was given a shot of rocephin, and oral abx to pharmacy.  Blood work done.  Strict precautions of when to go to the ER if needed.    Patient was notified of blood work over the phone.  No major concern at this time based on blood work.  Continue the plan of care as below.  She is aware and agrees.   Final Clinical Impressions(s) / UC Diagnoses   Final diagnoses:  Hand swelling  Abscess of finger of left hand     Discharge Instructions      You were seen today for infection of the hand/finger.  This was opened and drained today.  I recommend you continue to use warm epson salt soaks to the finger to help draw this out further.  I have given you a shot of an antibiotic today, and sent out an oral antibiotic to your pharmacy.  If your symptoms are not improving in the next 24-48 hrs, or noticing red streaking up the arm then please go to the ER for further testing.  Your blood work should be resulted later today and you will be notified if there is anything concerning.     ED Prescriptions     Medication Sig Dispense Auth. Provider   cephALEXin (KEFLEX) 500 MG capsule Take 1 capsule (500 mg total) by mouth 4 (four) times daily for 7 days. 28 capsule Jannifer Franklin, MD      PDMP not reviewed this encounter.   Jannifer Franklin,  MD 07/26/23 0865    Jannifer Franklin, MD 07/26/23 [phone]

## 2023-08-02 ENCOUNTER — Encounter: Payer: Self-pay | Admitting: Family

## 2023-08-02 ENCOUNTER — Ambulatory Visit: Payer: Medicare PPO | Admitting: Family

## 2023-08-02 VITALS — BP 134/82 | HR 85 | Temp 97.9°F | Resp 20 | Ht 61.0 in | Wt 214.2 lb

## 2023-08-02 DIAGNOSIS — I10 Essential (primary) hypertension: Secondary | ICD-10-CM | POA: Diagnosis not present

## 2023-08-02 DIAGNOSIS — E782 Mixed hyperlipidemia: Secondary | ICD-10-CM | POA: Diagnosis not present

## 2023-08-02 DIAGNOSIS — Z23 Encounter for immunization: Secondary | ICD-10-CM | POA: Diagnosis not present

## 2023-08-02 DIAGNOSIS — E039 Hypothyroidism, unspecified: Secondary | ICD-10-CM

## 2023-08-02 DIAGNOSIS — M25442 Effusion, left hand: Secondary | ICD-10-CM | POA: Diagnosis not present

## 2023-08-02 DIAGNOSIS — R7303 Prediabetes: Secondary | ICD-10-CM | POA: Diagnosis not present

## 2023-08-02 MED ORDER — IBUPROFEN 600 MG PO TABS: 600.0000 mg | ORAL_TABLET | Freq: Three times a day (TID) | ORAL | 0 refills | Status: AC | PRN

## 2023-08-02 MED ORDER — DOXYCYCLINE HYCLATE 100 MG PO TABS
100.0000 mg | ORAL_TABLET | Freq: Two times a day (BID) | ORAL | 0 refills | Status: AC
Start: 2023-08-02 — End: 2023-08-09

## 2023-08-02 NOTE — Progress Notes (Unsigned)
Provider: Richarda Blade FNP-C   Oprah Camarena, Donalee Citrin, NP  Patient Care Team: Shi Grose, Donalee Citrin, NP as PCP - General (Family Medicine) Griselda Miner, MD as Consulting Physician (General Surgery) Magrinat, Valentino Hue, MD (Inactive) as Consulting Physician (Oncology) Chipper Herb, MD (Inactive) as Consulting Physician (Radiation Oncology) Hubbard Hartshorn, NP (Inactive) as Nurse Practitioner (Nurse Practitioner) Salomon Fick, NP as Nurse Practitioner (Nurse Practitioner) Charna Elizabeth, MD as Consulting Physician (Gastroenterology) [facility], [place]  Extended Emergency Contact Information Primary Emergency Contact: Flahive,Midasia Address: [address]          [place], [place] [address] [place] of [place] Home Phone: [phone] Mobile Phone: [phone] Relation: Granddaughter  Code Status:  Full Code  Goals of care: Advanced Directive information    08/02/2023   11:00 AM  Advanced Directives  Does Patient Have a Medical Advance Directive? Yes  Type of Estate agent of [place];Living will  Does patient want to make changes to medical advance directive? No - Patient declined  Copy of Healthcare Power of Attorney in Chart? Yes - validated most recent copy scanned in chart (See row information)     Chief Complaint  Patient presents with   Medical Management of Chronic Issues    6 month follow up and discuss tdap ,flu,and covid vaccines.    HPI:  Pt is a 72 y.o. female seen today for  6 months follow up for medical management of chronic diseases. She was seen in the ED on 07/26/2023 for left hand swelling and pain sustain when sorting her veggies turnips greens and kale. She was prescribed Keflex x 7 days. Pus was expressed.Has completed antibiotics.   This is the Third day since no drainage.left hand still swollen and pain Has taken OTC analgesic without any relief.states swelling has improved but not resolved.    Past Medical History:  Diagnosis Date   Anemia    Arthritis    oa left shoulder   Back pain    buldging disc   Blood clot of neck vein    Breast cancer (HCC) 10/17/2014   lower inner quadrant of the right breast   Cancer (HCC)    colon   Constipation    Depression    takes Wellbutrin daily   Dizziness    r/t side effects from meds   Family history of adverse reaction to anesthesia    It is hard for my mother to wake up from anesthesia.   Generalized headaches    due to allergies, sinus   GERD (gastroesophageal reflux disease)    takes Protonix as needed   Hemorrhoids    History of bronchitis    last time about 6-72yrs ago   History of colon polyps    History of hiatal hernia    History of migraine    last one about 15+yrs ago   History of UTI    Hx of seasonal allergies    takes OTC allergy med nightly   Hyperlipidemia    takes Zetia and Zocor daily   Hypertension    takes Maxzide and Metoprolol daily   Hypothyroidism    takes Synthroid daily   Joint pain    Leg swelling    Mass of colon    Nasal congestion    Nausea    Personal history of chemotherapy    Personal history of radiation therapy    Pneumonia    walking about 6-35yrs ago   PONV (postoperative  nausea and vomiting)    pt states she is very easy to sedate   S/P radiation therapy 03/11/2015 through 04/25/2015                                                      Right breast 4500 cGy in 25 sessions, right breast boost 1600 cGy in 8 sessions                         Thyroid disease    Tuberculosis    as little girl     Vertigo    takes Meclizine prn   Wears glasses    Wears glasses    Past Surgical History:  Procedure Laterality Date   ABDOMINAL HYSTERECTOMY     APPENDECTOMY     BREAST LUMPECTOMY Right 2016   CARPAL TUNNEL RELEASE     CESAREAN SECTION  [id number]   COLONOSCOPY     ESOPHAGOGASTRODUODENOSCOPY     EXPLORATORY LAPAROTOMY     KNEE SURGERY  1998   right - arthroscopic   PARTIAL  COLECTOMY  03/30/2012   Procedure: PARTIAL COLECTOMY;  Surgeon: Cherylynn Ridges, MD;  Location: [facility] OR;  Service: General;  Laterality: N/A;   PORT-A-CATH REMOVAL N/A 06/25/2015   Procedure: REMOVAL PORT-A-CATH;  Surgeon: Chevis Pretty III, MD;  Location: [facility] OR;  Service: General;  Laterality: N/A;   PORTACATH PLACEMENT     RADIOACTIVE SEED GUIDED PARTIAL MASTECTOMY WITH AXILLARY SENTINEL LYMPH NODE BIOPSY Right 10/17/2014   Procedure: RADIOACTIVE SEED GUIDED PARTIAL MASTECTOMY WITH AXILLARY SENTINEL LYMPH NODE BIOPSY;  Surgeon: Chevis Pretty III, MD;  Location: [facility];  Service: General;  Laterality: Right;   TOTAL SHOULDER ARTHROPLASTY Left 03/04/2016   Procedure: LEFT TOTAL SHOULDER ARTHROPLASTY;  Surgeon: Francena Hanly, MD;  Location: [facility] OR;  Service: Orthopedics;  Laterality: Left;    No Known Allergies  Allergies as of 08/02/2023   No Known Allergies      Medication List        Accurate as of August 02, 2023 11:20 AM. If you have any questions, ask your nurse or doctor.          aspirin 81 MG tablet Take 81 mg by mouth daily.   atorvastatin 40 MG tablet Commonly known as: LIPITOR Take 1 tablet (40 mg total) by mouth daily.   BEET ROOT PO Take by mouth daily.   buPROPion 150 MG 24 hr tablet Commonly known as: WELLBUTRIN XL TAKE 1 TABLET BY MOUTH EVERY DAY   cephALEXin 500 MG capsule Commonly known as: KEFLEX Take 1 capsule (500 mg total) by mouth 4 (four) times daily for 7 days.   colchicine 0.6 MG tablet TAKE 2TABS AT FIRST SIGN OF GOUT, THEN 1TAB 1 HR LATER. TAKE 1TAB TWICE A DAY STARTING THE NEXT DAY   CRANBERRY PO Take by mouth daily.   diphenhydrAMINE 25 MG tablet Commonly known as: BENADRYL Take 25 mg by mouth daily as needed for allergies.   gabapentin 100 MG capsule Commonly known as: NEURONTIN Take 2 capsules (200 mg total) by mouth at bedtime.   levothyroxine 112 MCG tablet Commonly known as: SYNTHROID TAKE 1 TABLET BY MOUTH EVERY  DAY   nitroGLYCERIN 0.4 MG SL tablet Commonly known as: NITROSTAT Place 1 tablet (0.4 mg total)  under the tongue every 5 (five) minutes as needed for chest pain.   TART CHERRY PO Take by mouth daily.   triamterene-hydrochlorothiazide 37.5-25 MG tablet Commonly known as: MAXZIDE-25 Take 1 tablet by mouth daily.        Review of Systems  Constitutional:  Negative for appetite change, chills, fatigue, fever and unexpected weight change.  HENT:  Negative for congestion, dental problem, ear discharge, ear pain, facial swelling, hearing loss, nosebleeds, postnasal drip, rhinorrhea, sinus pressure, sinus pain, sneezing, sore throat, tinnitus and trouble swallowing.   Eyes:  Negative for pain, discharge, redness, itching and visual disturbance.  Respiratory:  Negative for cough, chest tightness, shortness of breath and wheezing.   Cardiovascular:  Negative for chest pain, palpitations and leg swelling.  Gastrointestinal:  Negative for abdominal distention, abdominal pain, blood in stool, constipation, diarrhea, nausea and vomiting.  Endocrine: Negative for cold intolerance, heat intolerance, polydipsia, polyphagia and polyuria.  Genitourinary:  Negative for difficulty urinating, dysuria, flank pain, frequency and urgency.  Musculoskeletal:  Positive for arthralgias and joint swelling. Negative for back pain, gait problem, myalgias, neck pain and neck stiffness.       Left hand swelling and pain   Skin:  Negative for color change, pallor, rash and wound.  Neurological:  Negative for dizziness, syncope, speech difficulty, weakness, light-headedness, numbness and headaches.  Hematological:  Does not bruise/bleed easily.  Psychiatric/Behavioral:  Negative for agitation, behavioral problems, confusion, hallucinations and sleep disturbance. The patient is not nervous/anxious.     Immunization History  Administered Date(s) Administered   Fluad Quad(high Dose 65+) 05/03/2018, 06/04/2020,  07/01/2021, 07/20/2022   Influenza Split 05/06/2011, 06/20/2012   Influenza, High Dose Seasonal PF 05/03/2018, 06/06/2019   Influenza,inj,Quad PF,6+ Mos 06/07/2013, 08/20/2014, 06/05/2015, 06/22/2016, 04/27/2017   Influenza,trivalent, recombinat, inj, PF 05/06/2011, 06/20/2012   Influenza-Unspecified 06/07/2013, 08/20/2014, 06/05/2015, 06/22/2016, 04/27/2017, 05/03/2018, 06/06/2019   PFIZER Comirnaty(Gray Top)Covid-19 Tri-Sucrose Vaccine 03/31/2021   PFIZER(Purple Top)SARS-COV-2 Vaccination 09/28/2019, 10/23/2019, 09/12/2020   Pfizer(Comirnaty)Fall Seasonal Vaccine 12 years and older 11/18/2022   Pneumococcal Conjugate-13 09/16/2015   Pneumococcal Polysaccharide-23 11/06/2013   Pneumococcal-Unspecified 11/06/2013   Polio, Unspecified 03/23/1955, 04/06/1955, 11/11/1955, 03/17/1960   Tdap 07/20/2011   Zoster Recombinant(Shingrix) 03/21/2020, 01/27/2023   Zoster, Live 08/17/2011   Pertinent  Health Maintenance Due  Topic Date Due   INFLUENZA VACCINE  03/17/2023   MAMMOGRAM  06/20/2025   Colonoscopy  02/21/2028   DEXA SCAN  Completed      09/29/2017   10:17 AM 11/02/2017   11:30 AM 01/27/2023   10:40 AM 07/13/2023    9:26 AM 08/02/2023   10:59 AM  Fall Risk  Falls in the past year? No No 0 0 0  Was there an injury with Fall?   0 0 0  Fall Risk Category Calculator   0 0 0  Patient at Risk for Falls Due to   No Fall Risks No Fall Risks   Fall risk Follow up   Falls evaluation completed Falls evaluation completed    Functional Status Survey:    Vitals:   08/02/23 1103  BP: 134/82  Pulse: 85  Resp: 20  Temp: 97.9 F (36.6 C)  SpO2: 97%  Weight: 214 lb 3.2 oz (97.2 kg)  Height: 5\' 1"  (1.549 m)   Body mass index is 40.47 kg/m. Physical Exam Vitals reviewed.  Constitutional:      General: She is not in acute distress.    Appearance: Normal appearance. She is obese. She is not ill-appearing or diaphoretic.  HENT:     Head: Normocephalic.     Right Ear: Tympanic membrane,  ear canal and external ear normal. There is no impacted cerumen.     Left Ear: Tympanic membrane, ear canal and external ear normal. There is no impacted cerumen.     Nose: Nose normal. No congestion or rhinorrhea.     Mouth/Throat:     Mouth: Mucous membranes are moist.     Pharynx: Oropharynx is clear. No oropharyngeal exudate or posterior oropharyngeal erythema.  Eyes:     General: No scleral icterus.       Right eye: No discharge.        Left eye: No discharge.     Extraocular Movements: Extraocular movements intact.     Conjunctiva/sclera: Conjunctivae normal.     Pupils: Pupils are equal, round, and reactive to light.  Neck:     Vascular: No carotid bruit.  Cardiovascular:     Rate and Rhythm: Normal rate and regular rhythm.     Pulses: Normal pulses.     Heart sounds: Normal heart sounds. No murmur heard.    No friction rub. No gallop.  Pulmonary:     Effort: Pulmonary effort is normal. No respiratory distress.     Breath sounds: Normal breath sounds. No wheezing, rhonchi or rales.  Chest:     Chest wall: No tenderness.  Abdominal:     General: Bowel sounds are normal. There is no distension.     Palpations: Abdomen is soft. There is no mass.     Tenderness: There is no abdominal tenderness. There is no right CVA tenderness, left CVA tenderness, guarding or rebound.  Musculoskeletal:        General: No swelling.     Right hand: Normal.     Left hand: Swelling and tenderness present. Decreased range of motion. Decreased strength. Normal sensation. Normal capillary refill. Normal pulse.     Cervical back: Normal range of motion. No rigidity or tenderness.     Right lower leg: No edema.     Left lower leg: No edema.     Comments: Unable to make a fist with left hand   Lymphadenopathy:     Cervical: No cervical adenopathy.  Skin:    General: Skin is warm and dry.     Coloration: Skin is not pale.     Findings: No bruising, erythema, lesion or rash.     Comments: Left tip  of the  thumb with dark skin color surrounding skin without any erythema but still very tender to touch    Neurological:     Mental Status: She is alert and oriented to person, place, and time.     Cranial Nerves: No cranial nerve deficit.     Sensory: No sensory deficit.     Motor: No weakness.     Coordination: Coordination normal.     Gait: Gait normal.  Psychiatric:        Mood and Affect: Mood normal.        Speech: Speech normal.        Behavior: Behavior normal.        Thought Content: Thought content normal.        Judgment: Judgment normal.     Labs reviewed: Recent Labs    01/27/23 1148 06/02/23 1158 07/26/23 0834  NA 140 144 141  K 3.9 3.6 4.2  CL 106 106 107  CO2 22 29 20*  GLUCOSE 102* 98 81  BUN 13  12 13  CREATININE 0.98 0.89 0.93  CALCIUM 8.9 8.8 8.9   Recent Labs    01/27/23 1148 06/02/23 1158 07/26/23 0834  AST 34 28 49*  ALT 34* 29 53*  ALKPHOS  --   --  60  BILITOT 0.9 0.8 0.9  PROT 6.6 6.4 6.9  ALBUMIN  --   --  3.1*   Recent Labs    01/27/23 1148 06/02/23 1158 07/26/23 0834  WBC 7.0 6.3 8.6  NEUTROABS 4,249 3,484 5.6  HGB 15.3 15.0 15.0  HCT 47.8* 46.1* 45.5  MCV 89.5 88.3 87.2  PLT 255 245 443*   Lab Results  Component Value Date   TSH 0.49 06/02/2023   Lab Results  Component Value Date   HGBA1C 6.1 (H) 06/02/2023   Lab Results  Component Value Date   CHOL 161 06/02/2023   HDL 78 06/02/2023   LDLCALC 68 06/02/2023   TRIG 74 06/02/2023   CHOLHDL 2.1 06/02/2023    Significant Diagnostic Results in last 30 days:  DG Finger Thumb Left Result Date: 07/26/2023 CLINICAL DATA:  Left hand swelling for the past 2 weeks. Discoloration at the tip of the left thumb. EXAM: LEFT THUMB 2+V COMPARISON:  Left hand x-rays dated March 25, 2015. FINDINGS: There is no evidence of fracture or dislocation. Mild degenerative changes of the thumb. Soft tissues are unremarkable. No radiopaque foreign body identified. IMPRESSION: 1. No acute  osseous abnormality.  No radiopaque foreign body. Electronically Signed   By: Obie Dredge M.D.   On: 07/26/2023 10:40    Assessment/Plan  1. Need for influenza vaccination (Primary) Flu shot administered by [facility] no acute reaction reported.  - Flu Vaccine Trivalent High Dose (Fluad)  2. Essential hypertension B/p well controlled  - continue dietary modification and exercise at least three times per week for 30 minutes.  - continue on Triamerene-Hydrochlorothiazide  - CBC with Differential/Platelet - COMPLETE METABOLIC PANEL WITH GFR  3. Acquired hypothyroidism Lab Results  Component Value Date   TSH 0.49 06/02/2023  - continue on levothyroxine   4. Mixed hyperlipidemia LDL at goal  - continue on atorvastatin -Continue dietary modification and exercise  5. Prediabetes Lab Results  Component Value Date   HGBA1C 6.1 (H) 06/02/2023  Continue dietary modification and exercise  6. Swelling of joint of left hand All digits swollen extending to dorsal of the hand  Left tip of the  thumb with dark skin color surrounding skin without any erythema but still very tender to touch  Has completed prescribed antibiotics from recent ED.will prescribe doxycycline 100 mg tablet twice daily x 7 days   - continue to soak hand in Epsom salt  - Ibuprofen 600 mg tablet every 8 hrs PRN for pain  - follow up in 2 weeks to revaluate pain and swelling  - Uric Acid - Sedimentation Rate - CBC with Differential/Platelet  Family/ staff Communication: Reviewed plan of care with patient verbalized understanding  Labs/tests ordered:  - Uric Acid - Sedimentation Rate - CBC with Differential/Platelet - COMPLETE METABOLIC PANEL WITH GFR  Next Appointment : Return in about 6 months (around 01/31/2024) for medical mangement of chronic issues. 2 weeks for left hand swelling .   Caesar Bookman, NP

## 2023-08-02 NOTE — Patient Instructions (Signed)
-   Apply ice compressor to left hand for 15 -20 minutes daily  - Elevate left hand on pillows to keep swelling down  - Notify provider or go to ED for worsening symptoms or fail to improve

## 2023-08-03 LAB — CBC WITH DIFFERENTIAL/PLATELET
Absolute Lymphocytes: 2070 {cells}/uL (ref 850–3900)
Absolute Monocytes: 543 {cells}/uL (ref 200–950)
Basophils Absolute: 74 {cells}/uL (ref 0–200)
Basophils Relative: 0.8 %
Eosinophils Absolute: 147 {cells}/uL (ref 15–500)
Eosinophils Relative: 1.6 %
HCT: 46.6 % — ABNORMAL HIGH (ref 35.0–45.0)
Hemoglobin: 14.9 g/dL (ref 11.7–15.5)
MCH: 28.3 pg (ref 27.0–33.0)
MCHC: 32 g/dL (ref 32.0–36.0)
MCV: 88.6 fL (ref 80.0–100.0)
MPV: 10 fL (ref 7.5–12.5)
Monocytes Relative: 5.9 %
Neutro Abs: 6366 {cells}/uL (ref 1500–7800)
Neutrophils Relative %: 69.2 %
Platelets: 444 10*3/uL — ABNORMAL HIGH (ref 140–400)
RBC: 5.26 10*6/uL — ABNORMAL HIGH (ref 3.80–5.10)
RDW: 13.9 % (ref 11.0–15.0)
Total Lymphocyte: 22.5 %
WBC: 9.2 10*3/uL (ref 3.8–10.8)

## 2023-08-03 LAB — SEDIMENTATION RATE: Sed Rate: 9 mm/h (ref 0–30)

## 2023-08-03 LAB — COMPLETE METABOLIC PANEL WITH GFR
AG Ratio: 1.3 (calc) (ref 1.0–2.5)
ALT: 34 U/L — ABNORMAL HIGH (ref 6–29)
AST: 28 U/L (ref 10–35)
Albumin: 3.9 g/dL (ref 3.6–5.1)
Alkaline phosphatase (APISO): 62 U/L (ref 37–153)
BUN: 13 mg/dL (ref 7–25)
CO2: 31 mmol/L (ref 20–32)
Calcium: 9.6 mg/dL (ref 8.6–10.4)
Chloride: 105 mmol/L (ref 98–110)
Creat: 0.86 mg/dL (ref 0.60–1.00)
Globulin: 3 g/dL (ref 1.9–3.7)
Glucose, Bld: 92 mg/dL (ref 65–99)
Potassium: 3.9 mmol/L (ref 3.5–5.3)
Sodium: 145 mmol/L (ref 135–146)
Total Bilirubin: 0.8 mg/dL (ref 0.2–1.2)
Total Protein: 6.9 g/dL (ref 6.1–8.1)
eGFR: 71 mL/min/{1.73_m2} (ref >=60)

## 2023-08-03 LAB — URIC ACID: Uric Acid, Serum: 8.3 mg/dL — ABNORMAL HIGH (ref 2.5–7.0)

## 2023-08-05 ENCOUNTER — Other Ambulatory Visit: Payer: Self-pay | Admitting: General Surgery

## 2023-08-05 DIAGNOSIS — M7989 Other specified soft tissue disorders: Secondary | ICD-10-CM | POA: Diagnosis not present

## 2023-08-05 DIAGNOSIS — N6312 Unspecified lump in the right breast, upper inner quadrant: Secondary | ICD-10-CM | POA: Diagnosis not present

## 2023-08-05 DIAGNOSIS — B999 Unspecified infectious disease: Secondary | ICD-10-CM | POA: Diagnosis not present

## 2023-08-15 ENCOUNTER — Ambulatory Visit
Admission: RE | Admit: 2023-08-15 | Discharge: 2023-08-15 | Disposition: A | Discharge status: Home / Self Care | Payer: Medicare PPO | Source: Ambulatory Visit | Attending: General Surgery | Admitting: General Surgery

## 2023-08-15 DIAGNOSIS — N6310 Unspecified lump in the right breast, unspecified quadrant: Secondary | ICD-10-CM | POA: Diagnosis not present

## 2023-08-15 DIAGNOSIS — N6312 Unspecified lump in the right breast, upper inner quadrant: Secondary | ICD-10-CM

## 2023-08-15 MED ORDER — GADOPICLENOL 0.5 MMOL/ML IV SOLN
9.0000 mL | Freq: Once | INTRAVENOUS | Status: AC | PRN
  Administered 2023-08-15: 9 mL via INTRAVENOUS

## 2023-08-16 ENCOUNTER — Ambulatory Visit: Payer: Medicare PPO | Admitting: Family

## 2023-08-24 DIAGNOSIS — M25532 Pain in left wrist: Secondary | ICD-10-CM | POA: Diagnosis not present

## 2023-08-24 DIAGNOSIS — M79642 Pain in left hand: Secondary | ICD-10-CM | POA: Diagnosis not present

## 2023-09-09 ENCOUNTER — Ambulatory Visit: Payer: Medicare PPO | Admitting: Family

## 2023-09-13 DIAGNOSIS — N6312 Unspecified lump in the right breast, upper inner quadrant: Secondary | ICD-10-CM | POA: Diagnosis not present

## 2023-10-20 ENCOUNTER — Other Ambulatory Visit: Payer: Self-pay | Admitting: Family

## 2023-10-29 DIAGNOSIS — M25532 Pain in left wrist: Secondary | ICD-10-CM | POA: Diagnosis not present

## 2023-10-29 DIAGNOSIS — M79645 Pain in left finger(s): Secondary | ICD-10-CM | POA: Diagnosis not present

## 2023-11-07 DIAGNOSIS — M79642 Pain in left hand: Secondary | ICD-10-CM | POA: Diagnosis not present

## 2023-11-07 DIAGNOSIS — M25532 Pain in left wrist: Secondary | ICD-10-CM | POA: Diagnosis not present

## 2023-11-18 ENCOUNTER — Encounter: Payer: Self-pay | Admitting: Family

## 2023-11-18 ENCOUNTER — Ambulatory Visit: Admitting: Family

## 2023-11-18 VITALS — BP 124/72 | HR 79 | Temp 97.9°F | Resp 18 | Ht 61.0 in | Wt 208.2 lb

## 2023-11-18 DIAGNOSIS — M1000 Idiopathic gout, unspecified site: Secondary | ICD-10-CM

## 2023-11-18 MED ORDER — ALLOPURINOL 100 MG PO TABS: 100.0000 mg | ORAL_TABLET | Freq: Every day | ORAL | 6 refills | Status: AC

## 2023-11-18 MED ORDER — PREDNISONE 20 MG PO TABS: 40.0000 mg | ORAL_TABLET | Freq: Every day | ORAL | 0 refills | Status: AC

## 2023-11-18 NOTE — Patient Instructions (Signed)
 Gout  Gout is painful swelling of your joints. Gout is a type of arthritis. It is caused by having too much uric acid in your body. Uric acid is a chemical that is made when your body breaks down substances called purines. If your body has too much uric acid, sharp crystals can form and build up in your joints. This causes pain and swelling. Gout attacks can happen quickly and be very painful (acute gout). Over time, the attacks can affect more joints and happen more often (chronic gout). What are the causes? Gout is caused by too much uric acid in your blood. This can happen because: Your kidneys do not remove enough uric acid from your blood. Your body makes too much uric acid. You eat too many foods that are high in purines. These foods include organ meats, some seafood, and beer. Trauma or stress can bring on an attack. What increases the risk? Having a family history of gout. Being female and middle-aged. Being female and having gone through menopause. Having an organ transplant. Taking certain medicines. Having certain conditions, such as: Being very overweight (obese). Lead poisoning. Kidney disease. A skin condition called psoriasis. Other risks include: Losing weight too quickly. Not having enough water in the body (being dehydrated). Drinking alcohol, especially beer. Drinking beverages that are sweetened with a type of sugar called fructose. What are the signs or symptoms? An attack of acute gout often starts at night and usually happens in just one joint. The most common place is the big toe. Other joints that may be affected include joints of the feet, ankle, knee, fingers, wrist, or elbow. Symptoms may include: Very bad pain. Warmth. Swelling. Stiffness. Tenderness. The affected joint may be very painful to touch. Shiny, red, or purple skin. Chills and fever. Chronic gout may cause symptoms more often. More joints may be involved. You may also have white or yellow lumps  (tophi) on your hands or feet or in other areas near your joints. How is this treated? Treatment for an acute attack may include medicines for pain and swelling, such as: NSAIDs, such as ibuprofen. Steroids taken by mouth or injected into a joint. Colchicine. This can be given by mouth or through an IV tube. Treatment to prevent future attacks may include: Taking small doses of NSAIDs or colchicine daily. Using a medicine that reduces uric acid levels in your blood, such as allopurinol. Making changes to your diet. You may need to see a food expert (dietitian) about what to eat and drink to prevent gout. Follow these instructions at home: During a gout attack  If told, put ice on the painful area. To do this: Put ice in a plastic bag. Place a towel between your skin and the bag. Leave the ice on for 20 minutes, 2-3 times a day. Take off the ice if your skin turns bright red. This is very important. If you cannot feel pain, heat, or cold, you have a greater risk of damage to the area. Raise the painful joint above the level of your heart as often as you can. Rest the joint as much as possible. If the joint is in your leg, you may be given crutches. Follow instructions from your doctor about what you cannot eat or drink. Avoiding future gout attacks Eat a low-purine diet. Avoid foods and drinks such as: Liver. Kidney. Anchovies. Asparagus. Herring. Mushrooms. Mussels. Beer. Stay at a healthy weight. If you want to lose weight, talk with your doctor. Do not  lose weight too fast. Start or continue an exercise plan as told by your doctor. Eating and drinking Avoid drinks sweetened by fructose. Drink enough fluids to keep your pee (urine) pale yellow. If you drink alcohol: Limit how much you have to: 0-1 drink a day for women who are not pregnant. 0-2 drinks a day for men. Know how much alcohol is in a drink. In the [place] one drink equals one 12 oz bottle of beer (355 mL), one 5 oz  glass of wine (148 mL), or one 1 oz glass of hard liquor (44 mL). General instructions Take over-the-counter and prescription medicines only as told by your doctor. Ask your doctor if you should avoid driving or using machines while you are taking your medicine. Return to your normal activities when your doctor says that it is safe. Keep all follow-up visits. Where to find more information [employer]: www.niams.http://www.myers.net/ Contact a doctor if: You have another gout attack. You still have symptoms of a gout attack after 10 days of treatment. You have problems (side effects) because of your medicines. You have chills or a fever. You have burning pain when you pee (urinate). You have pain in your lower back or belly. Get help right away if: You have very bad pain. Your pain cannot be controlled. You cannot pee. Summary Gout is painful swelling of the joints. The most common site of pain is the big toe, but it can affect other joints. Medicines and avoiding some foods can help to prevent and treat gout attacks. This information is not intended to replace advice given to you by your health care provider. Make sure you discuss any questions you have with your health care provider. Document Revised: 05/06/2021 Document Reviewed: 05/06/2021 Elsevier Patient Education  2024 [employer].

## 2023-11-20 NOTE — Progress Notes (Signed)
 Provider: Richarda Blade FNP-C  Rosemond Lyttle, Donalee Citrin, NP  Patient Care Team: Rayen Dafoe, Donalee Citrin, NP as PCP - General (Family Medicine) Griselda Miner, MD as Consulting Physician (General Surgery) Magrinat, Valentino Hue, MD (Inactive) as Consulting Physician (Oncology) Chipper Herb, MD (Inactive) as Consulting Physician (Radiation Oncology) Hubbard Hartshorn, NP (Inactive) as Nurse Practitioner (Nurse Practitioner) Salomon Fick, NP as Nurse Practitioner (Nurse Practitioner) Charna Elizabeth, MD as Consulting Physician (Gastroenterology) [facility], [place]  Extended Emergency Contact Information Primary Emergency Contact: Yardley,Midasia Address: [address]          [place], [place] [address] [place] of [place] Home Phone: [phone] Mobile Phone: [phone] Relation: Granddaughter  Code Status:  Full Code  Goals of care: Advanced Directive information    11/18/2023    2:32 PM  Advanced Directives  Does Patient Have a Medical Advance Directive? Yes  Type of Estate agent of [place];Living will  Does patient want to make changes to medical advance directive? No - Patient declined  Copy of Healthcare Power of Attorney in Chart? Yes - validated most recent copy scanned in chart (See row information)     Chief Complaint  Patient presents with   Acute Visit    Possible infection in finger.    Discussed the use of AI scribe software for clinical note transcription with the patient, who gave verbal consent to proceed.  History of Present Illness   Jordan [name] "MARIE" is a 73 year old female with gout who presents with a flare-up of gout in the right middle finger and concerns about a possible infection on her right index finger after removal of artificial nail.  She is experiencing a flare-up of gout in her finger, which has become more frequent, now occurring every couple of weeks compared to every six  months previously. The affected area is swollen and painful, though the pain has decreased with colchicine use. Swelling is more pronounced on one side of the finger. She is not currently on allopurinol but manages the condition with colchicine. A past episode involved significant swelling in her thumb, diagnosed as gout by an orthopedic surgeon.  She describes a recent incident where she cracked a artificial nail on right index finger, which eventually came off, leaving a bothersome area she initially thought might be infected. She has been using a Band-Aid to protect it and reports bleeding around the nail but no opening. The area is painful to touch.  She discusses her diet, noting that she drank non-zero ginger ale by mistake, which she believes may have contributed to the recent gout flare-up. She is aware that certain foods can trigger her gout symptoms.  She mentions a recent incident where she turned in bed and experienced hip pain, which has since improved.  No gout symptoms in her ankles and arthritis is limited to her fingers. No history of diabetes.    Past Medical History:  Diagnosis Date   Anemia    Arthritis    oa left shoulder   Back pain    buldging disc   Blood clot of neck vein    Breast cancer (HCC) 10/17/2014   lower inner quadrant of the right breast   Cancer (HCC)    colon   Constipation    Depression    takes Wellbutrin daily   Dizziness    r/t side effects from meds   Family history of adverse reaction to anesthesia    It is hard  for my mother to wake up from anesthesia.   Generalized headaches    due to allergies, sinus   GERD (gastroesophageal reflux disease)    takes Protonix as needed   Hemorrhoids    History of bronchitis    last time about 6-38yrs ago   History of colon polyps    History of hiatal hernia    History of migraine    last one about 15+yrs ago   History of UTI    Hx of seasonal allergies    takes OTC allergy med nightly    Hyperlipidemia    takes Zetia and Zocor daily   Hypertension    takes Maxzide and Metoprolol daily   Hypothyroidism    takes Synthroid daily   Joint pain    Leg swelling    Mass of colon    Nasal congestion    Nausea    Personal history of chemotherapy    Personal history of radiation therapy    Pneumonia    walking about 6-29yrs ago   PONV (postoperative nausea and vomiting)    pt states she is very easy to sedate   S/P radiation therapy 03/11/2015 through 04/25/2015                                                      Right breast 4500 cGy in 25 sessions, right breast boost 1600 cGy in 8 sessions                         Thyroid disease    Tuberculosis    as little girl     Vertigo    takes Meclizine prn   Wears glasses    Wears glasses    Past Surgical History:  Procedure Laterality Date   ABDOMINAL HYSTERECTOMY     APPENDECTOMY     BREAST LUMPECTOMY Right 2016   CARPAL TUNNEL RELEASE     CESAREAN SECTION  1610,9604   COLONOSCOPY     ESOPHAGOGASTRODUODENOSCOPY     EXPLORATORY LAPAROTOMY     KNEE SURGERY  1998   right - arthroscopic   PARTIAL COLECTOMY  03/30/2012   Procedure: PARTIAL COLECTOMY;  Surgeon: Cherylynn Ridges, MD;  Location: [facility] OR;  Service: General;  Laterality: N/A;   PORT-A-CATH REMOVAL N/A 06/25/2015   Procedure: REMOVAL PORT-A-CATH;  Surgeon: Chevis Pretty III, MD;  Location: [facility] OR;  Service: General;  Laterality: N/A;   PORTACATH PLACEMENT     RADIOACTIVE SEED GUIDED PARTIAL MASTECTOMY WITH AXILLARY SENTINEL LYMPH NODE BIOPSY Right 10/17/2014   Procedure: RADIOACTIVE SEED GUIDED PARTIAL MASTECTOMY WITH AXILLARY SENTINEL LYMPH NODE BIOPSY;  Surgeon: Chevis Pretty III, MD;  Location: [facility];  Service: General;  Laterality: Right;   TOTAL SHOULDER ARTHROPLASTY Left 03/04/2016   Procedure: LEFT TOTAL SHOULDER ARTHROPLASTY;  Surgeon: Francena Hanly, MD;  Location: [facility] OR;  Service: Orthopedics;  Laterality: Left;    No Known Allergies  Outpatient  Encounter Medications as of 11/18/2023  Medication Sig   alendronate (FOSAMAX) 70 MG tablet Take 70 mg by mouth every 7 (seven) days. Take with a full glass of water on an empty stomach.   allopurinol (ZYLOPRIM) 100 MG tablet Take 1 tablet (100 mg total) by mouth daily.   aspirin 81 MG tablet Take 81 mg  by mouth daily.   atorvastatin (LIPITOR) 40 MG tablet TAKE 1 TABLET BY MOUTH EVERY DAY   buPROPion (WELLBUTRIN XL) 150 MG 24 hr tablet TAKE 1 TABLET BY MOUTH EVERY DAY   colchicine 0.6 MG tablet TAKE 2TABS AT FIRST SIGN OF GOUT, THEN 1TAB 1 HR LATER. TAKE 1TAB TWICE A DAY STARTING THE NEXT DAY   CRANBERRY PO Take by mouth daily.   diphenhydrAMINE (BENADRYL) 25 MG tablet Take 25 mg by mouth daily as needed for allergies.   gabapentin (NEURONTIN) 100 MG capsule Take 2 capsules (200 mg total) by mouth at bedtime.   ibuprofen (ADVIL) 600 MG tablet Take 1 tablet (600 mg total) by mouth every 8 (eight) hours as needed.   levothyroxine (SYNTHROID) 112 MCG tablet TAKE 1 TABLET BY MOUTH EVERY DAY   Misc Natural Products (BEET ROOT PO) Take by mouth daily.   nitroGLYCERIN (NITROSTAT) 0.4 MG SL tablet Place 1 tablet (0.4 mg total) under the tongue every 5 (five) minutes as needed for chest pain.   predniSONE (DELTASONE) 20 MG tablet Take 2 tablets (40 mg total) by mouth daily with breakfast for 5 days.   TART CHERRY PO Take by mouth daily.   triamterene-hydrochlorothiazide (MAXZIDE-25) 37.5-25 MG tablet Take 1 tablet by mouth daily.   Facility-Administered Encounter Medications as of 11/18/2023  Medication   chlorhexidine (HIBICLENS) 4 % liquid 1 application    Review of Systems  Constitutional:  Negative for appetite change, chills, fatigue, fever and unexpected weight change.  Respiratory:  Negative for cough, chest tightness, shortness of breath and wheezing.   Cardiovascular:  Negative for chest pain, palpitations and leg swelling.  Musculoskeletal:  Positive for arthralgias. Negative for back pain,  gait problem, joint swelling, myalgias, neck pain and neck stiffness.       Right index fingernail injury and right middle finger gout   Skin:  Negative for color change, pallor, rash and wound.  Neurological:  Negative for dizziness, weakness, light-headedness, numbness and headaches.    Immunization History  Administered Date(s) Administered   Fluad Quad(high Dose 65+) 05/03/2018, 06/04/2020, 07/01/2021, 07/20/2022   Fluad Trivalent(High Dose 65+) 08/02/2023   Influenza Split 05/06/2011, 06/20/2012   Influenza, High Dose Seasonal PF 05/03/2018, 06/06/2019   Influenza,inj,Quad PF,6+ Mos 06/07/2013, 08/20/2014, 06/05/2015, 06/22/2016, 04/27/2017   Influenza,trivalent, recombinat, inj, PF 05/06/2011, 06/20/2012   Influenza-Unspecified 06/07/2013, 08/20/2014, 06/05/2015, 06/22/2016, 04/27/2017, 05/03/2018, 06/06/2019   PFIZER Comirnaty(Gray Top)Covid-19 Tri-Sucrose Vaccine 03/31/2021   PFIZER(Purple Top)SARS-COV-2 Vaccination 09/28/2019, 10/23/2019, 09/12/2020   Pfizer(Comirnaty)Fall Seasonal Vaccine 12 years and older 11/18/2022   Pneumococcal Conjugate-13 09/16/2015   Pneumococcal Polysaccharide-23 11/06/2013   Pneumococcal-Unspecified 11/06/2013   Polio, Unspecified 03/23/1955, 04/06/1955, 11/11/1955, 03/17/1960   Tdap 07/20/2011   Zoster Recombinant(Shingrix) 03/21/2020, 01/27/2023   Zoster, Live 08/17/2011   Pertinent  Health Maintenance Due  Topic Date Due   INFLUENZA VACCINE  03/16/2024   Colonoscopy  02/21/2028   DEXA SCAN  Completed      11/02/2017   11:30 AM 01/27/2023   10:40 AM 07/13/2023    9:26 AM 08/02/2023   10:59 AM 11/18/2023    2:31 PM  Fall Risk  Falls in the past year? No 0 0 0 0  Was there an injury with Fall?  0 0 0 0  Fall Risk Category Calculator  0 0 0 0  Patient at Risk for Falls Due to  No Fall Risks No Fall Risks  No Fall Risks  Fall risk Follow up  Falls evaluation completed Falls evaluation  completed  Falls evaluation completed   Functional  Status Survey:    Vitals:   11/18/23 1443  BP: 124/72  Pulse: 79  Resp: 18  Temp: 97.9 F (36.6 C)  SpO2: 98%  Weight: 208 lb 3.2 oz (94.4 kg)  Height: 5\' 1"  (1.549 m)   Body mass index is 39.34 kg/m. Physical Exam  GENERAL: Alert, cooperative, well developed, no acute distress. HEENT: Normocephalic, normal oropharynx, moist mucous membranes. CHEST: Clear to auscultation bilaterally, no wheezes, rhonchi, or crackles. CARDIOVASCULAR: Normal heart rate and rhythm, S1 and S2 normal without murmurs. ABDOMEN: Soft, non-tender, non-distended, without organomegaly, normal bowel sounds. EXTREMITIES: Swelling on the index finger, no cyanosis or edema. NEUROLOGICAL: Cranial nerves grossly intact, moves all extremities without gross motor or sensory deficit.   Labs reviewed: Recent Labs    06/02/23 1158 07/26/23 0834 08/02/23 1153  NA 144 141 145  K 3.6 4.2 3.9  CL 106 107 105  CO2 29 20* 31  GLUCOSE 98 81 92  BUN 12 13 13   CREATININE 0.89 0.93 0.86  CALCIUM 8.8 8.9 9.6   Recent Labs    06/02/23 1158 07/26/23 0834 08/02/23 1153  AST 28 49* 28  ALT 29 53* 34*  ALKPHOS  --  60  --   BILITOT 0.8 0.9 0.8  PROT 6.4 6.9 6.9  ALBUMIN  --  3.1*  --    Recent Labs    06/02/23 1158 07/26/23 0834 08/02/23 1153  WBC 6.3 8.6 9.2  NEUTROABS 3,484 5.6 6,366  HGB 15.0 15.0 14.9  HCT 46.1* 45.5 46.6*  MCV 88.3 87.2 88.6  PLT 245 443* 444*   Lab Results  Component Value Date   TSH 0.49 06/02/2023   Lab Results  Component Value Date   HGBA1C 6.1 (H) 06/02/2023   Lab Results  Component Value Date   CHOL 161 06/02/2023   HDL 78 06/02/2023   LDLCALC 68 06/02/2023   TRIG 74 06/02/2023   CHOLHDL 2.1 06/02/2023    Significant Diagnostic Results in last 30 days:  No results found.  Assessment/Plan  Gout Chronic gout with frequent flare-ups, currently affecting the middle finger with swelling and tenderness. She has been using colchicine for acute management.  Allopurinol is a viable option for long-term management due to good kidney function. Dietary triggers, including non-zero ginger ale, may have contributed to the flare-up. Referral to orthopedics considered for further evaluation of finger swelling due to previous surgical intervention for similar issues. - Prescribe prednisone for acute flare-up management. - Initiate allopurinol for long-term gout management after completing colchicine course. - Refer to orthopedics for further evaluation of finger swelling if prednisone does not reduce swelling. - Advise on dietary modifications to prevent gout flare-ups. - Instruct to soak finger in warm water with Epsom salt to reduce swelling and pain.  Finger Injury Injury to the finger due to a cracked nail with subungual hematoma. No signs of infection. Advised to protect the finger to prevent further injury and allow natural healing. Emphasized not manipulating the area to avoid complications. - Advise to protect the injured finger to prevent further injury. - Instruct to soak finger in warm water with Epsom salt to reduce swelling and pain.  Follow-up Follow-up appointments scheduled for June 17th and June 26th for routine check-ups and Medicare wellness visit. Advised to report any signs of infection or worsening symptoms before the scheduled appointments. - Follow up on June 17th for routine check-up. - Follow up on June 26th for Medicare  wellness visit. - Instruct to report any signs of infection or worsening symptoms.   Family/ staff Communication: Reviewed plan of care with patient verbalized   Labs/tests ordered: None   Next Appointment: Return if symptoms worsen or fail to improve.   Total time: 20 minutes. Greater than 50% of total time spent doing patient education regarding right middle finger gout,right index finger,health maintenance including symptom/medication management.   Caesar Bookman, NP

## 2023-12-08 DIAGNOSIS — R2231 Localized swelling, mass and lump, right upper limb: Secondary | ICD-10-CM | POA: Diagnosis not present

## 2023-12-08 DIAGNOSIS — Z1330 Encounter for screening examination for mental health and behavioral disorders, unspecified: Secondary | ICD-10-CM | POA: Diagnosis not present

## 2024-01-04 DIAGNOSIS — M109 Gout, unspecified: Secondary | ICD-10-CM | POA: Diagnosis not present

## 2024-01-04 DIAGNOSIS — I10 Essential (primary) hypertension: Secondary | ICD-10-CM | POA: Diagnosis not present

## 2024-01-04 DIAGNOSIS — M51369 Other intervertebral disc degeneration, lumbar region without mention of lumbar back pain or lower extremity pain: Secondary | ICD-10-CM | POA: Diagnosis not present

## 2024-01-31 ENCOUNTER — Ambulatory Visit: Payer: Medicare PPO | Admitting: Family

## 2024-02-01 DIAGNOSIS — Z0000 Encounter for general adult medical examination without abnormal findings: Secondary | ICD-10-CM | POA: Diagnosis not present

## 2024-02-01 DIAGNOSIS — E785 Hyperlipidemia, unspecified: Secondary | ICD-10-CM | POA: Diagnosis not present

## 2024-02-01 DIAGNOSIS — R739 Hyperglycemia, unspecified: Secondary | ICD-10-CM | POA: Diagnosis not present

## 2024-02-01 DIAGNOSIS — I10 Essential (primary) hypertension: Secondary | ICD-10-CM | POA: Diagnosis not present

## 2024-02-08 DIAGNOSIS — M109 Gout, unspecified: Secondary | ICD-10-CM | POA: Diagnosis not present

## 2024-02-08 DIAGNOSIS — M51369 Other intervertebral disc degeneration, lumbar region without mention of lumbar back pain or lower extremity pain: Secondary | ICD-10-CM | POA: Diagnosis not present

## 2024-02-08 DIAGNOSIS — I10 Essential (primary) hypertension: Secondary | ICD-10-CM | POA: Diagnosis not present

## 2024-02-08 DIAGNOSIS — E2839 Other primary ovarian failure: Secondary | ICD-10-CM | POA: Diagnosis not present

## 2024-02-08 DIAGNOSIS — Z0000 Encounter for general adult medical examination without abnormal findings: Secondary | ICD-10-CM | POA: Diagnosis not present

## 2024-02-08 DIAGNOSIS — E785 Hyperlipidemia, unspecified: Secondary | ICD-10-CM | POA: Diagnosis not present

## 2024-02-08 DIAGNOSIS — M818 Other osteoporosis without current pathological fracture: Secondary | ICD-10-CM | POA: Diagnosis not present

## 2024-02-08 DIAGNOSIS — E039 Hypothyroidism, unspecified: Secondary | ICD-10-CM | POA: Diagnosis not present

## 2024-02-08 DIAGNOSIS — F331 Major depressive disorder, recurrent, moderate: Secondary | ICD-10-CM | POA: Diagnosis not present

## 2024-02-13 DIAGNOSIS — M51369 Other intervertebral disc degeneration, lumbar region without mention of lumbar back pain or lower extremity pain: Secondary | ICD-10-CM | POA: Diagnosis not present

## 2024-02-13 DIAGNOSIS — I10 Essential (primary) hypertension: Secondary | ICD-10-CM | POA: Diagnosis not present

## 2024-02-13 DIAGNOSIS — M109 Gout, unspecified: Secondary | ICD-10-CM | POA: Diagnosis not present

## 2024-03-07 DIAGNOSIS — M109 Gout, unspecified: Secondary | ICD-10-CM | POA: Diagnosis not present

## 2024-03-07 DIAGNOSIS — I10 Essential (primary) hypertension: Secondary | ICD-10-CM | POA: Diagnosis not present

## 2024-03-15 DIAGNOSIS — M109 Gout, unspecified: Secondary | ICD-10-CM | POA: Diagnosis not present

## 2024-03-15 DIAGNOSIS — I10 Essential (primary) hypertension: Secondary | ICD-10-CM | POA: Diagnosis not present

## 2024-03-15 DIAGNOSIS — M51369 Other intervertebral disc degeneration, lumbar region without mention of lumbar back pain or lower extremity pain: Secondary | ICD-10-CM | POA: Diagnosis not present

## 2024-03-21 DIAGNOSIS — N6312 Unspecified lump in the right breast, upper inner quadrant: Secondary | ICD-10-CM | POA: Diagnosis not present

## 2024-04-05 DIAGNOSIS — S60511A Abrasion of right hand, initial encounter: Secondary | ICD-10-CM | POA: Diagnosis not present

## 2024-04-05 DIAGNOSIS — I10 Essential (primary) hypertension: Secondary | ICD-10-CM | POA: Diagnosis not present

## 2024-04-05 DIAGNOSIS — M109 Gout, unspecified: Secondary | ICD-10-CM | POA: Diagnosis not present

## 2024-04-05 DIAGNOSIS — M8580 Other specified disorders of bone density and structure, unspecified site: Secondary | ICD-10-CM | POA: Diagnosis not present

## 2024-04-05 DIAGNOSIS — Z23 Encounter for immunization: Secondary | ICD-10-CM | POA: Diagnosis not present

## 2024-04-05 DIAGNOSIS — E2839 Other primary ovarian failure: Secondary | ICD-10-CM | POA: Diagnosis not present

## 2024-04-10 ENCOUNTER — Encounter: Payer: Medicare PPO | Admitting: Family

## 2024-04-13 DIAGNOSIS — M109 Gout, unspecified: Secondary | ICD-10-CM | POA: Diagnosis not present

## 2024-04-13 DIAGNOSIS — I10 Essential (primary) hypertension: Secondary | ICD-10-CM | POA: Diagnosis not present

## 2024-04-13 DIAGNOSIS — M51369 Other intervertebral disc degeneration, lumbar region without mention of lumbar back pain or lower extremity pain: Secondary | ICD-10-CM | POA: Diagnosis not present
